# Patient Record
Sex: Female | Born: 1970
Health system: Southern US, Community
[De-identification: ages and names within clinical notes are randomized; demographics above are authoritative.]

## PROBLEM LIST (undated history)

## (undated) ENCOUNTER — Encounter

## (undated) ENCOUNTER — Ambulatory Visit

## (undated) ENCOUNTER — Telehealth

## (undated) ENCOUNTER — Ambulatory Visit: Attending: Pharmacist | Primary: Pharmacist

## (undated) ENCOUNTER — Ambulatory Visit: Payer: PRIVATE HEALTH INSURANCE

## (undated) ENCOUNTER — Encounter
Attending: Student in an Organized Health Care Education/Training Program | Primary: Student in an Organized Health Care Education/Training Program

## (undated) ENCOUNTER — Encounter: Attending: Pharmacist | Primary: Pharmacist

## (undated) ENCOUNTER — Telehealth: Attending: Family Medicine | Primary: Family Medicine

## (undated) DIAGNOSIS — R519 Headache, unspecified: Secondary | ICD-10-CM

## (undated) DIAGNOSIS — M1990 Unspecified osteoarthritis, unspecified site: Secondary | ICD-10-CM

## (undated) DIAGNOSIS — R51 Headache: Secondary | ICD-10-CM

## (undated) DIAGNOSIS — N189 Chronic kidney disease, unspecified: Secondary | ICD-10-CM

## (undated) DIAGNOSIS — E789 Disorder of lipoprotein metabolism, unspecified: Secondary | ICD-10-CM

## (undated) DIAGNOSIS — R55 Syncope and collapse: Secondary | ICD-10-CM

## (undated) DIAGNOSIS — K50113 Crohn's disease of large intestine with fistula: Secondary | ICD-10-CM

## (undated) DIAGNOSIS — F329 Major depressive disorder, single episode, unspecified: Secondary | ICD-10-CM

## (undated) DIAGNOSIS — J189 Pneumonia, unspecified organism: Secondary | ICD-10-CM

## (undated) DIAGNOSIS — K219 Gastro-esophageal reflux disease without esophagitis: Secondary | ICD-10-CM

## (undated) DIAGNOSIS — F988 Other specified behavioral and emotional disorders with onset usually occurring in childhood and adolescence: Secondary | ICD-10-CM

## (undated) DIAGNOSIS — M797 Fibromyalgia: Secondary | ICD-10-CM

## (undated) DIAGNOSIS — F32A Depression, unspecified: Secondary | ICD-10-CM

## (undated) DIAGNOSIS — F419 Anxiety disorder, unspecified: Secondary | ICD-10-CM

## (undated) DIAGNOSIS — E7800 Pure hypercholesterolemia, unspecified: Secondary | ICD-10-CM

## (undated) HISTORY — DX: Pneumonia, unspecified organism: J18.9

## (undated) HISTORY — DX: Gastro-esophageal reflux disease without esophagitis: K21.9

## (undated) HISTORY — DX: Headache: R51

## (undated) HISTORY — DX: Other specified behavioral and emotional disorders with onset usually occurring in childhood and adolescence: F98.8

## (undated) HISTORY — DX: Chronic kidney disease, unspecified: N18.9

## (undated) HISTORY — DX: Crohn's disease of large intestine with fistula: K50.113

## (undated) HISTORY — DX: Headache, unspecified: R51.9

## (undated) HISTORY — PX: TUBAL LIGATION: SHX77

## (undated) HISTORY — DX: Fibromyalgia: M79.7

## (undated) HISTORY — PX: FRACTURE SURGERY: SHX138

## (undated) HISTORY — DX: Unspecified osteoarthritis, unspecified site: M19.90

## (undated) HISTORY — DX: Anxiety disorder, unspecified: F41.9

## (undated) HISTORY — PX: OTHER SURGICAL HISTORY: SHX169

## (undated) HISTORY — DX: Syncope and collapse: R55

## (undated) MED ORDER — PREDNISONE 10 MG TABLETS IN A DOSE PACK: Freq: Every day | ORAL | 0.00000 days

---
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
## 1997-11-21 ENCOUNTER — Other Ambulatory Visit: Admission: RE | Admit: 1997-11-21 | Discharge: 1997-11-21 | Payer: Self-pay | Admitting: Obstetrics and Gynecology

## 1997-11-21 ENCOUNTER — Ambulatory Visit (HOSPITAL_COMMUNITY): Admission: RE | Admit: 1997-11-21 | Discharge: 1997-11-21 | Payer: Self-pay | Admitting: Obstetrics and Gynecology

## 1997-12-01 ENCOUNTER — Other Ambulatory Visit: Admission: RE | Admit: 1997-12-01 | Discharge: 1997-12-01 | Payer: Self-pay | Admitting: Obstetrics and Gynecology

## 1997-12-02 ENCOUNTER — Ambulatory Visit (HOSPITAL_COMMUNITY): Admission: RE | Admit: 1997-12-02 | Discharge: 1997-12-02 | Payer: Self-pay | Admitting: Gastroenterology

## 1998-03-30 ENCOUNTER — Observation Stay (HOSPITAL_COMMUNITY): Admission: AD | Admit: 1998-03-30 | Discharge: 1998-03-31 | Payer: Self-pay | Admitting: Obstetrics and Gynecology

## 1998-06-25 ENCOUNTER — Inpatient Hospital Stay (HOSPITAL_COMMUNITY): Admission: AD | Admit: 1998-06-25 | Discharge: 1998-06-25 | Payer: Self-pay

## 1998-06-26 ENCOUNTER — Inpatient Hospital Stay (HOSPITAL_COMMUNITY): Admission: AD | Admit: 1998-06-26 | Discharge: 1998-06-26 | Payer: Self-pay | Admitting: Obstetrics & Gynecology

## 1998-06-28 ENCOUNTER — Inpatient Hospital Stay (HOSPITAL_COMMUNITY): Admission: AD | Admit: 1998-06-28 | Discharge: 1998-06-30 | Payer: Self-pay | Admitting: Obstetrics and Gynecology

## 1999-01-26 ENCOUNTER — Encounter: Payer: Self-pay | Admitting: Gastroenterology

## 1999-01-26 ENCOUNTER — Ambulatory Visit (HOSPITAL_COMMUNITY): Admission: RE | Admit: 1999-01-26 | Discharge: 1999-01-26 | Payer: Self-pay | Admitting: Gastroenterology

## 1999-03-09 ENCOUNTER — Ambulatory Visit (HOSPITAL_COMMUNITY): Admission: RE | Admit: 1999-03-09 | Discharge: 1999-03-09 | Payer: Self-pay | Admitting: Gastroenterology

## 1999-03-09 ENCOUNTER — Encounter (INDEPENDENT_AMBULATORY_CARE_PROVIDER_SITE_OTHER): Payer: Self-pay

## 1999-03-15 ENCOUNTER — Ambulatory Visit (HOSPITAL_BASED_OUTPATIENT_CLINIC_OR_DEPARTMENT_OTHER): Admission: RE | Admit: 1999-03-15 | Discharge: 1999-03-15 | Payer: Self-pay | Admitting: Surgery

## 1999-03-18 ENCOUNTER — Ambulatory Visit (HOSPITAL_COMMUNITY): Admission: RE | Admit: 1999-03-18 | Discharge: 1999-03-18 | Payer: Self-pay | Admitting: Gastroenterology

## 1999-03-18 ENCOUNTER — Encounter: Payer: Self-pay | Admitting: Gastroenterology

## 1999-07-21 ENCOUNTER — Ambulatory Visit (HOSPITAL_BASED_OUTPATIENT_CLINIC_OR_DEPARTMENT_OTHER): Admission: RE | Admit: 1999-07-21 | Discharge: 1999-07-21 | Payer: Self-pay | Admitting: Surgery

## 1999-07-21 ENCOUNTER — Encounter (INDEPENDENT_AMBULATORY_CARE_PROVIDER_SITE_OTHER): Payer: Self-pay | Admitting: Specialist

## 2000-06-24 ENCOUNTER — Emergency Department (HOSPITAL_COMMUNITY): Admission: EM | Admit: 2000-06-24 | Discharge: 2000-06-24 | Payer: Self-pay | Admitting: Emergency Medicine

## 2000-06-24 ENCOUNTER — Encounter: Payer: Self-pay | Admitting: Emergency Medicine

## 2000-06-28 ENCOUNTER — Emergency Department (HOSPITAL_COMMUNITY): Admission: EM | Admit: 2000-06-28 | Discharge: 2000-06-29 | Payer: Self-pay | Admitting: Emergency Medicine

## 2000-08-17 ENCOUNTER — Emergency Department (HOSPITAL_COMMUNITY): Admission: EM | Admit: 2000-08-17 | Discharge: 2000-08-17 | Payer: Self-pay | Admitting: Emergency Medicine

## 2000-10-19 ENCOUNTER — Emergency Department (HOSPITAL_COMMUNITY): Admission: EM | Admit: 2000-10-19 | Discharge: 2000-10-19 | Payer: Self-pay | Admitting: Emergency Medicine

## 2000-11-05 ENCOUNTER — Emergency Department (HOSPITAL_COMMUNITY): Admission: EM | Admit: 2000-11-05 | Discharge: 2000-11-05 | Payer: Self-pay | Admitting: Emergency Medicine

## 2000-11-08 ENCOUNTER — Emergency Department (HOSPITAL_COMMUNITY): Admission: EM | Admit: 2000-11-08 | Discharge: 2000-11-08 | Payer: Self-pay | Admitting: Emergency Medicine

## 2001-02-16 ENCOUNTER — Ambulatory Visit (HOSPITAL_COMMUNITY): Admission: RE | Admit: 2001-02-16 | Discharge: 2001-02-16 | Payer: Self-pay | Admitting: *Deleted

## 2001-02-23 ENCOUNTER — Ambulatory Visit (HOSPITAL_COMMUNITY): Admission: RE | Admit: 2001-02-23 | Discharge: 2001-02-23 | Payer: Self-pay | Admitting: *Deleted

## 2001-02-23 ENCOUNTER — Encounter (INDEPENDENT_AMBULATORY_CARE_PROVIDER_SITE_OTHER): Payer: Self-pay | Admitting: *Deleted

## 2001-03-13 ENCOUNTER — Encounter: Payer: Self-pay | Admitting: *Deleted

## 2001-03-13 ENCOUNTER — Encounter: Admission: RE | Admit: 2001-03-13 | Discharge: 2001-03-13 | Payer: Self-pay | Admitting: *Deleted

## 2001-03-19 ENCOUNTER — Encounter: Admission: RE | Admit: 2001-03-19 | Discharge: 2001-03-19 | Payer: Self-pay | Admitting: Cardiology

## 2001-03-19 ENCOUNTER — Encounter: Payer: Self-pay | Admitting: Cardiology

## 2001-03-28 ENCOUNTER — Encounter: Payer: Self-pay | Admitting: *Deleted

## 2001-03-28 ENCOUNTER — Encounter: Admission: RE | Admit: 2001-03-28 | Discharge: 2001-03-28 | Payer: Self-pay | Admitting: *Deleted

## 2001-05-04 ENCOUNTER — Ambulatory Visit (HOSPITAL_COMMUNITY): Admission: RE | Admit: 2001-05-04 | Discharge: 2001-05-04 | Payer: Self-pay | Admitting: *Deleted

## 2001-06-06 ENCOUNTER — Ambulatory Visit (HOSPITAL_COMMUNITY): Admission: RE | Admit: 2001-06-06 | Discharge: 2001-06-06 | Payer: Self-pay | Admitting: *Deleted

## 2001-07-13 ENCOUNTER — Encounter (INDEPENDENT_AMBULATORY_CARE_PROVIDER_SITE_OTHER): Payer: Self-pay | Admitting: Specialist

## 2001-07-13 ENCOUNTER — Ambulatory Visit (HOSPITAL_COMMUNITY): Admission: RE | Admit: 2001-07-13 | Discharge: 2001-07-13 | Payer: Self-pay | Admitting: *Deleted

## 2001-08-13 ENCOUNTER — Inpatient Hospital Stay (HOSPITAL_COMMUNITY): Admission: EM | Admit: 2001-08-13 | Discharge: 2001-08-15 | Payer: Self-pay | Admitting: Emergency Medicine

## 2001-08-13 ENCOUNTER — Encounter: Payer: Self-pay | Admitting: Orthopedic Surgery

## 2001-08-13 ENCOUNTER — Encounter: Payer: Self-pay | Admitting: Emergency Medicine

## 2001-10-11 ENCOUNTER — Ambulatory Visit (HOSPITAL_COMMUNITY): Admission: RE | Admit: 2001-10-11 | Discharge: 2001-10-11 | Payer: Self-pay | Admitting: Orthopedic Surgery

## 2001-10-29 ENCOUNTER — Encounter: Admission: RE | Admit: 2001-10-29 | Discharge: 2002-01-27 | Payer: Self-pay | Admitting: Orthopedic Surgery

## 2001-11-29 ENCOUNTER — Ambulatory Visit (HOSPITAL_COMMUNITY): Admission: RE | Admit: 2001-11-29 | Discharge: 2001-11-29 | Payer: Self-pay | Admitting: *Deleted

## 2001-11-30 ENCOUNTER — Encounter (INDEPENDENT_AMBULATORY_CARE_PROVIDER_SITE_OTHER): Payer: Self-pay

## 2001-11-30 ENCOUNTER — Ambulatory Visit (HOSPITAL_COMMUNITY): Admission: RE | Admit: 2001-11-30 | Discharge: 2001-11-30 | Payer: Self-pay | Admitting: *Deleted

## 2002-02-22 ENCOUNTER — Encounter (HOSPITAL_COMMUNITY): Admission: RE | Admit: 2002-02-22 | Discharge: 2002-05-23 | Payer: Self-pay | Admitting: *Deleted

## 2002-03-25 ENCOUNTER — Ambulatory Visit (HOSPITAL_COMMUNITY): Admission: RE | Admit: 2002-03-25 | Discharge: 2002-03-25 | Payer: Self-pay | Admitting: *Deleted

## 2002-03-25 ENCOUNTER — Encounter (INDEPENDENT_AMBULATORY_CARE_PROVIDER_SITE_OTHER): Payer: Self-pay | Admitting: Specialist

## 2002-03-28 ENCOUNTER — Encounter: Payer: Self-pay | Admitting: *Deleted

## 2002-03-28 ENCOUNTER — Ambulatory Visit (HOSPITAL_COMMUNITY): Admission: RE | Admit: 2002-03-28 | Discharge: 2002-03-28 | Payer: Self-pay | Admitting: *Deleted

## 2002-05-31 ENCOUNTER — Ambulatory Visit (HOSPITAL_COMMUNITY): Admission: RE | Admit: 2002-05-31 | Discharge: 2002-05-31 | Payer: Self-pay | Admitting: *Deleted

## 2002-09-06 ENCOUNTER — Encounter (HOSPITAL_COMMUNITY): Admission: RE | Admit: 2002-09-06 | Discharge: 2002-09-06 | Payer: Self-pay | Admitting: *Deleted

## 2002-10-01 ENCOUNTER — Ambulatory Visit (HOSPITAL_COMMUNITY): Admission: RE | Admit: 2002-10-01 | Discharge: 2002-10-01 | Payer: Self-pay | Admitting: *Deleted

## 2002-10-18 ENCOUNTER — Encounter: Payer: Self-pay | Admitting: Emergency Medicine

## 2002-10-18 ENCOUNTER — Emergency Department (HOSPITAL_COMMUNITY): Admission: AC | Admit: 2002-10-18 | Discharge: 2002-10-18 | Payer: Self-pay

## 2002-10-29 ENCOUNTER — Emergency Department (HOSPITAL_COMMUNITY): Admission: EM | Admit: 2002-10-29 | Discharge: 2002-10-29 | Payer: Self-pay | Admitting: Emergency Medicine

## 2002-12-19 ENCOUNTER — Encounter (HOSPITAL_COMMUNITY): Admission: RE | Admit: 2002-12-19 | Discharge: 2003-03-19 | Payer: Self-pay | Admitting: *Deleted

## 2003-06-07 ENCOUNTER — Emergency Department (HOSPITAL_COMMUNITY): Admission: EM | Admit: 2003-06-07 | Discharge: 2003-06-07 | Payer: Self-pay | Admitting: Emergency Medicine

## 2003-08-18 ENCOUNTER — Encounter: Admission: RE | Admit: 2003-08-18 | Discharge: 2003-08-18 | Payer: Self-pay | Admitting: *Deleted

## 2004-03-09 ENCOUNTER — Ambulatory Visit (HOSPITAL_COMMUNITY): Admission: RE | Admit: 2004-03-09 | Discharge: 2004-03-09 | Payer: Self-pay | Admitting: Cardiology

## 2006-02-27 ENCOUNTER — Encounter (HOSPITAL_COMMUNITY): Admission: RE | Admit: 2006-02-27 | Discharge: 2006-05-28 | Payer: Self-pay | Admitting: Gastroenterology

## 2006-06-16 ENCOUNTER — Encounter (HOSPITAL_COMMUNITY): Admission: RE | Admit: 2006-06-16 | Discharge: 2006-09-14 | Payer: Self-pay | Admitting: Gastroenterology

## 2006-10-17 ENCOUNTER — Encounter (HOSPITAL_COMMUNITY): Admission: RE | Admit: 2006-10-17 | Discharge: 2007-01-15 | Payer: Self-pay | Admitting: Gastroenterology

## 2006-11-22 ENCOUNTER — Ambulatory Visit (HOSPITAL_COMMUNITY): Admission: RE | Admit: 2006-11-22 | Discharge: 2006-11-22 | Payer: Self-pay | Admitting: Obstetrics

## 2007-02-26 ENCOUNTER — Encounter (HOSPITAL_COMMUNITY): Admission: RE | Admit: 2007-02-26 | Discharge: 2007-05-27 | Payer: Self-pay | Admitting: Gastroenterology

## 2007-05-23 ENCOUNTER — Ambulatory Visit (HOSPITAL_COMMUNITY): Admission: RE | Admit: 2007-05-23 | Discharge: 2007-05-23 | Payer: Self-pay | Admitting: Gastroenterology

## 2007-06-29 ENCOUNTER — Emergency Department (HOSPITAL_COMMUNITY): Admission: EM | Admit: 2007-06-29 | Discharge: 2007-06-29 | Payer: Self-pay | Admitting: Emergency Medicine

## 2007-07-09 ENCOUNTER — Emergency Department (HOSPITAL_COMMUNITY): Admission: EM | Admit: 2007-07-09 | Discharge: 2007-07-09 | Payer: Self-pay | Admitting: Emergency Medicine

## 2007-10-17 ENCOUNTER — Encounter (HOSPITAL_COMMUNITY): Admission: RE | Admit: 2007-10-17 | Discharge: 2008-01-15 | Payer: Self-pay | Admitting: Gastroenterology

## 2008-04-13 ENCOUNTER — Emergency Department (HOSPITAL_COMMUNITY): Admission: EM | Admit: 2008-04-13 | Discharge: 2008-04-13 | Payer: Self-pay | Admitting: Emergency Medicine

## 2009-04-29 ENCOUNTER — Encounter: Admission: RE | Admit: 2009-04-29 | Discharge: 2009-04-29 | Payer: Self-pay | Admitting: Family Medicine

## 2009-05-08 ENCOUNTER — Encounter: Admission: RE | Admit: 2009-05-08 | Discharge: 2009-05-08 | Payer: Self-pay | Admitting: Gastroenterology

## 2009-06-09 ENCOUNTER — Encounter: Admission: RE | Admit: 2009-06-09 | Discharge: 2009-06-09 | Payer: Self-pay | Admitting: Family Medicine

## 2009-07-27 ENCOUNTER — Encounter: Admission: RE | Admit: 2009-07-27 | Discharge: 2009-09-17 | Payer: Self-pay | Admitting: Family Medicine

## 2009-10-28 ENCOUNTER — Encounter: Admission: RE | Admit: 2009-10-28 | Discharge: 2009-10-28 | Payer: Self-pay | Admitting: Family Medicine

## 2010-02-05 ENCOUNTER — Encounter: Admission: RE | Admit: 2010-02-05 | Discharge: 2010-02-05 | Payer: Self-pay | Admitting: Family Medicine

## 2010-03-11 ENCOUNTER — Encounter: Admission: RE | Admit: 2010-03-11 | Discharge: 2010-03-11 | Payer: Self-pay | Admitting: Family Medicine

## 2010-07-11 ENCOUNTER — Encounter: Payer: Self-pay | Admitting: Family Medicine

## 2010-08-02 ENCOUNTER — Inpatient Hospital Stay (HOSPITAL_COMMUNITY)
Admission: AD | Admit: 2010-08-02 | Discharge: 2010-08-02 | Disposition: A | Discharge status: Home / Self Care | Payer: Medicaid Other | Source: Ambulatory Visit | Attending: Obstetrics & Gynecology | Admitting: Obstetrics & Gynecology

## 2010-08-02 DIAGNOSIS — N938 Other specified abnormal uterine and vaginal bleeding: Secondary | ICD-10-CM

## 2010-08-02 DIAGNOSIS — N949 Unspecified condition associated with female genital organs and menstrual cycle: Secondary | ICD-10-CM

## 2010-08-02 LAB — WET PREP, GENITAL
Trich, Wet Prep: NONE SEEN
Yeast Wet Prep HPF POC: NONE SEEN

## 2010-08-02 LAB — URINALYSIS, ROUTINE W REFLEX MICROSCOPIC
Ketones, ur: NEGATIVE mg/dL
Protein, ur: NEGATIVE mg/dL
Urine Glucose, Fasting: NEGATIVE mg/dL
Urobilinogen, UA: 0.2 mg/dL (ref 0.0–1.0)
pH: 5.5 (ref 5.0–8.0)

## 2010-08-02 LAB — CBC
Hemoglobin: 12.6 g/dL (ref 12.0–15.0)
MCHC: 32.1 g/dL (ref 30.0–36.0)
MCV: 91.6 fL (ref 78.0–100.0)
Platelets: 251 10*3/uL (ref 150–400)
RBC: 4.28 MIL/uL (ref 3.87–5.11)
RDW: 14.5 % (ref 11.5–15.5)
WBC: 7.1 10*3/uL (ref 4.0–10.5)

## 2010-08-02 LAB — URINE MICROSCOPIC-ADD ON

## 2010-08-02 LAB — POCT PREGNANCY, URINE: Preg Test, Ur: NEGATIVE

## 2010-11-05 NOTE — Discharge Summary (Signed)
[place]. [facility]  Patient:    Katrina Grant, Katrina Grant Visit Number: [id number] MRN: [id number]          Service Type: SUR Location: 4W [place] 01 Attending Physician:  Mayme Genta Dictated by:   Sharmon Revere, M.D. Admit Date:  08/13/2001 Discharge Date: 08/15/2001                             Discharge Summary  ADMITTING DIAGNOSES:  1. Fracture of distal radius and ulna styloid of left wrist.  2. Sprain, left ankle.  3. History of Crohns disease.  DISCHARGE DIAGNOSES:  1. Fracture of distal radius and ulna styloid of left wrist.  2. Sprain, left ankle.  3. History of Crohns disease.  COMPLICATIONS: None.  INFECTIONS: None.  OPERATION/PROCEDURE:  1. Closed reduction and external fixation of left wrist.  2. Ankle brace to left ankle.  HISTORY OF PRESENT ILLNESS: This is a 40 year old female, who had stepped off a curb and missed her step and twisted her left ankle, and fell onto her left wrist.  The patient was seen at [facility] for treatment.  PHYSICAL EXAMINATION: Pertinent physical examination was of the left wrist with angulation deformity, tender, swollen.  Neurovascular status intact. Left ankle tender laterally and anteriorly, with tender anterior talofibular and calcaneal fibula ligament.  HOSPITAL COURSE: The patient had an ankle brace applied to the left ankle and underwent closed reduction and external fixation to her left wrist. Postoperatively the patient had difficulty holding food down with nausea and vomiting.  This was brought under control and the patients pain was brought under control with IV pain medication, followed by p.o. pain medication.  The patients swelling subsided with fair finger movement, nail beds blanched quite well.  DISCHARGE MEDICATIONS:  1. Percocet q.4h p.r.n. for pain.  2. Daypro 600 mg b.i.d.  3. Phenergan 25 mg q.6h p.r.n.  DISCHARGE INSTRUCTIONS:  1. Sling  elevation.  2. Ice packs.  FOLLOW-UP: Return to the office in one week.  DISCHARGE CONDITION: The patient was discharged in stable and satisfactory condition. Dictated by:   Sharmon Revere, M.D. Attending Physician:  Mayme Genta DD:  09/04/01 TD:  09/05/01 Job: [id number]

## 2010-11-05 NOTE — H&P (Signed)
[place]. [facility]  Patient:    Katrina Grant, Katrina Grant Visit Number: [id number] MRN: [id number]          Service Type: SUR Location: 4W [place] 01 Attending Physician:  Mayme Genta Dictated by:   Sharmon Revere, M.D. Admit Date:  08/13/2001 Discharge Date: 08/15/2001                           History and Physical  CHIEF COMPLAINT: Painful deformed left wrist, as well as tender, swollen left foot.  HISTORY OF PRESENT ILLNESS: This is a 40 year old black female, who was stepping off a curb and missed her footing, and twisted her left ankle and landed onto her left wrist.  The patient complained of pain and deformity of the left wrist as well as pain and swelling of the left ankle.  PAST MEDICAL HISTORY:  1. Crohns disease, steroid use x5 years.  The patient was recently taken off     two weeks ago.  2. Dental surgery.  3. Bilateral tubal ligation.  4. Pilonidal cyst excision.  FAMILY HISTORY: High blood pressure and diabetes in father.  SOCIAL HISTORY: Habits, none.  MEDICATIONS: None.  ALLERGIES: None known.  PHYSICAL EXAMINATION:  VITAL SIGNS: Temperature 96.7 degrees, pulse 74, respirations 18, blood pressure 132/94.  GENERAL: Alert and oriented, no acute distress.  HEENT: Normocephalic.  Conjunctivae and sclerae clear.  NECK: Supple.  CHEST: Clear.  CARDIOVASCULAR: S1 and S2, regular.  EXTREMITIES: Left ankle tender and swollen over the anterior talofibular and calcaneal fibular ligament.  Dorsalis pedis is intact.  Sensory is intact. Left wrist tender, swollen, with angulation deformity.  Nail beds pink and blanch.  Pulses intact.  Sensory intact.  LABORATORY DATA: X-rays reveal comminuted reverse Colles fracture, left wrist.  IMPRESSION:  1. Fracture of left distal radius and ulna styloid.  2. Sprain, left ankle.  3. History of Crohns disease.Dictated by:   Sharmon Revere, M.D.  Attending Physician:  Mayme Genta DD:  09/04/01 TD:  09/05/01 Job: [id number]

## 2010-11-05 NOTE — Op Note (Signed)
[place]. [facility]  Patient:    Katrina Grant, Katrina Grant Visit Number: [id number] MRN: [id number]          Service Type: SUR Location: 4W [place] 01 Attending Physician:  Mayme Genta Dictated by:   Sharmon Revere, M.D. Proc. Date: 08/13/01 Admit Date:  08/13/2001 Discharge Date: 08/15/2001                             Operative Report  PREOPERATIVE DIAGNOSIS: Fracture of distal radius and ulna styloid.  POSTOPERATIVE DIAGNOSIS: Fracture of distal radius and ulna styloid.  OPERATION/PROCEDURE: Closed manipulative reduction and external fixation.  SURGEON: Sharmon Revere, M.D.  ANESTHESIA: General.  DESCRIPTION OF PROCEDURE: The patient was taken to the operating room after given adequate preoperative medication and was given general anesthesia and intubated.  The left wrist and forearm was prepped with DuraPrep and draped in sterile manner.  The C-arm was used to visualize fracture reduction. Manipulative reduction was done, getting a very good reduction of the fracture site.  Two pins were placed into the second metacarpal and two in the radius proximal to the fracture site.  External fixator was applied.  Manipulated reduction and traction was applied across the fracture site, holding it in very good position.  Irrigation was done, followed by 0 nylon sutures about the pin sites.  Compressive dressing was applied.  The patient tolerated the procedure quite well and went to the recovery room in stable and satisfactory condition. Dictated by:   Sharmon Revere, M.D. Attending Physician:  Mayme Genta DD:  09/04/01 TD:  09/05/01 Job: [id number]

## 2010-11-05 NOTE — Op Note (Signed)
[place]. [facility]  Patient:    Katrina Grant, Katrina Grant Visit Number: [id number] MRN: [id number]          Service Type: DSU Location: [facility] [id number] Attending Physician:  Mayme Genta Dictated by:   Sharmon Revere, M.D. Proc. Date: 10/11/01 Admit Date:  10/11/2001 Discharge Date: 10/11/2001                             Operative Report  PREOPERATIVE DIAGNOSIS:  Colles fracture, external fixator, left forearm.  POSTOPERATIVE DIAGNOSIS:  Colles fracture, external fixator, left forearm.  SURGEON:  Sharmon Revere, M.D.  ANESTHESIA:  General.  PROBLEM:  Removal of external fixator and wound closure of left forearm.  DESCRIPTION OF PROCEDURE:  The patient was taken to the operating room, after being given adequate preoperative medication.  She was given a general anesthesia and intubated.  The left forearm was scrubbed with Betadine scrub and painted with Betadine solution, and then draped in a sterile manner.  The external fixator was removed, followed by the removal of the four pins. Irrigation with antibiotic solution was done.  With the use of a Valora Corporal, the entrance wounds were undermined.  The skin was freed up from the bone.  The skin edges were freshened and the wound closure was done with #4-0 nylon.  A compressive dressing was applied.  A wrist splint was applied.  The patient tolerated the procedure quite well and went to the recovery room in stable and satisfactory condition.  PLAN:  The patient is being discharged home on Percocet one q.4h. p.r.n. pain, ice packs, elevation, and to return to the office in one week.  CONDITION ON DISCHARGE:  The patient is discharged in stable and satisfactory condition. Dictated by:   Sharmon Revere, M.D. Attending Physician:  Mayme Genta DD:  11/06/01 TD:  11/06/01 Job: [id number]

## 2010-11-05 NOTE — Op Note (Signed)
[place]. [facility]  Patient:    JISELL MAJER                          MRN: [id number] Proc. Date: 07/21/99 Adm. Date:  [id number] Attending:  Cristela Blue CC:         Cleotis Nipper, M.D.                           Operative Report  DATE OF BIRTH:  1971/04/26  CCS#: [id number].  PREOPERATIVE DIAGNOSIS:  Pilonidal sinus infection of buttocks with Crohns disease of the colon.  POSTOPERATIVE DIAGNOSIS:  Pilonidal disease/infection of buttocks with Crohns disease of the colon with rectal stricture.  PROCEDURE:  Rigid sigmoidoscopy to 20 cm and pilonidal cystectomy.  SURGEON:  Fenton Malling. Lucia Gaskins, M.D.  ASSISTANT:  None.  ANESTHESIA:  General endotracheal.  ESTIMATED BLOOD LOSS:  35 cc.  DRAINS:  Packing in the buttocks wound.  INDICATIONS:  Ms. Lilia Pro is a 40 year old female, who is a patient of Dr. Youlanda Mighty Buccini, who has Crohns disease.  She is on steroids for her Crohns disease. She has been plagued over the last years with a pilonidal infection.  It has been unclear to me whether this pilonidal infection was related to her Crohns disease or not.  However, she has had both a colonoscopy and a CT-scan of her buttocks,  neither of which have shown any fistulization from her Crohns disease.  She now comes for rigid sigmoidoscopy and pilonidal cystectomy.  OPERATIVE NOTE:  Patient is given a general endotracheal anesthetic as supervised by Dr. Earnest Bailey.  She was placed in the lithotomy position.  I then did a rigid sigmoidoscopy to 20 cm.  I found an inflamed area at about 10 cm from the  anal verge, some nodularity down to about 7 cm and she had a stricture, which only allowed my finger, which measured of the stricture is about 1.2 cm right at the  anus.  I dilated the stricture.  I saw no evidence of an fistulization from the colon hat leads to the buttocks that I could see internally.  The patient was then  flipped to a prone position with pads under her hips, legs, and her arms placed up over her head.  I then taped the buttocks a part.  I then painted her buttocks with Betadine solution and injected methylene blue into the sinus tract.  I then excised the sinus tract, which is probably a good 10 cm in length.  At the top of the sinus tract was probably 5 cm in width.  It seemed to be pointing to  both sides and I excised the skin over this.  I then controlled the bleeding with Bovie electrocautery.  I closed the right side of her buttocks a little bit, because the skin was so far a part, I am not sure  whether these sutures will hold overnight.  I then packed the wound with Betadine soaked Kerlix and then sterilely dressed the wound.  The entire wound was infiltrated with about 20 cc of 0.25% Marcaine plain.  The patient tolerated the procedure well.  Will arrange home health care nursing to go by and help her daily on the dressing changes, give her Vicodin for pain. She will see me back in the office in about five days. DD:  07/21/99 TD:  07/21/99 Job: [id number]  [id number]

## 2010-11-05 NOTE — Op Note (Signed)
NAME:  Katrina Grant, Katrina Grant                          ACCOUNT NO.:  [id number]   MEDICAL RECORD NO.:  [id number]                   PATIENT TYPE:  AMB   LOCATION:  ENDO                                 FACILITY:  [facility]   PHYSICIAN:  Woody Seller., M.D.               DATE OF BIRTH:  02-13-1971   DATE OF PROCEDURE:  DATE OF DISCHARGE:  03/25/2002                                 OPERATIVE REPORT   PREOPERATIVE DIAGNOSIS:  History of Crohn's disease.   POSTOPERATIVE DIAGNOSES:  Inflammation in the cecal region and rectosigmoid  area about where biopsies were taken.  Otherwise, grossly normal  colonoscopic examination with random biopsies taken for inflammatory bowel  disease.   PROCEDURE PERFORMED:  Colonoscopy with biopsies.   PREMEDICATION:  Demerol 100 mg IV, Versed 10 mg IV over 15 minute period of  time.   INSTRUMENT:  Olympus video pancolonoscope.   HISTORY OF PRESENT ILLNESS:  This pleasant 40 year old female who has been  known to me for the past year with a history of Crohn's disease.  She has  underwent endoscopies which showed evidence of moderate to acute  inflammation initially and then was treated with the Remicade system.  Post  endoscopy was grossly normal all the way to the cecum.  She had been treated  with this medication after the Remicade and then subsequently was placed on  methotrexate which she could not tolerate.  She was then switched to another  medication, I believe Dipentum which the patient tolerated poorly with  recurrence of symptoms that was present.  She had a repeat colonoscopic  examination at that time which showed evidence of inflammation that was  present.  She was started on a different medication since then and has been,  and actually she was started back with another Remicade injection shot to  that point and underwent a series of two to three with good improvement of  her system at that time.   The patient returns back to the office with  complaints of having some  abdominal discomfort that is noted.  She has not noticed any blood per  rectum at this time but she has noticed some change in her stool habits with  diarrhea that is increasing in character.  She has not noticed any major  weight loss but does have a concern that the inflammation is recurring at  this time.  Her last Remicade injection was approximately two months ago and  she has been doing well otherwise until recent events.   PHYSICAL EXAMINATION:  GENERAL: She is a very pleasant female, appears to be  in no acute distress today.  VITAL SIGNS: Her vital signs are stable.  HEENT: Examination is anicteric.  NECK: Supple.  LUNGS: Clear.  HEART: A  regular rate and rhythm without heaves, thrills, murmurs or gallops.  ABDOMEN: Soft.  No gross tenderness to palpation is noted.  No  hepatosplenomegaly appreciated.  RECTAL: Digital rectal exam appears to be  within normal limits.  EXTREMITIES: Showed no cyanosis, clubbing or edema.   PLAN:  I am going to proceed with the colonoscopic examination at this time  to evaluate her response to the Remicade therapy at this time.  Initially,  she had an excellent response with healing of all the tissue but  subsequently, the responses have not been as effective.  This is the third  colonoscopic examination that she is having as she, after therapy with some  recurrence of symptoms ongoing.   INFORMED CONSENT:  I discussed with the patient her condition and what is  going on.  I discussed my concerns about it and the recommendations to  repeat the colonoscopic exam.  She has agreed to have the procedure  performed at this time.  A consent form was signed for this procedure to be  performed.   PREOPERATIVE PREPARATION:  The patient presented to [facility] with  the IV for IV sedating medication was started.  A monitor was placed on the  patient to monitor the patient's vital signs and oxygen saturation.  Oxygen   via a ventilator system was used and after adequate sedation was performed,  the procedure was begun.   DESCRIPTION OF PROCEDURE:  The instrument was advanced with the patient  lying in the left lateral position to approximately 80 cm to the proximal  colon to the cecum.  This was confirmed by visualization of the ileocecal  valve that was noted.   There appeared to be evidence of pseudopolyps/polyps that were appreciated  that were present at this time.  There was no evidence of any mass, lesions  or any strictures that were noted.  The polyps were in the area of  inflammation and were biopsied at that time.  Otherwise, the other areas of  the colon appeared to show no evidence of any inflammatory changes or  ulcerated changes or mucosal changes that were noted.   The vascular pattern appeared to be grossly within normal limits except  those increased amounts of stool obscuring some of the visualization in some  areas that was present.  There was an area in the ascending colon which had  inflammation that was present and some friability of the mucosal tissue was  noted.  But no major active bleeding was appreciated at this time.  Photographs were taken of the area that were evaluated at this time.   The mucosal content showed no evidence of diverticular changes or any, or  there was however, some evidence of granular changes intermixed with normal  mucosal tissue throughout the area.  And again, as noted the  polyps/pseudopolyps that were present at this time.   There was no increased tortuosity of the colon at this time and the patient  tolerated the procedure well without any major difficulties noted.  The  instrument was retracted back with retroflexed view of the anus area showed  no evidence of any hemorrhoids nor any inflammatory changes that were noted.  Photographs were then taken at this time.  The instrument was subsequently removed per rectum without difficulty with the  patient tolerating the  procedure well.   TREATMENT:  1. Limited exam.  Consider restarting her back on the Remicade at this time     but first I will await the results of the biopsy reports.  2. Will have her follow up with me in two weeks where we can discuss  the     approach with what needs to be done.  3. For the interim, just continue conservative management.                                               Woody Seller., M.D.    JM/MEDQ  D:  05/11/2002  T:  05/11/2002  Job:  [id number]

## 2010-11-05 NOTE — H&P (Signed)
[place]. [facility]  Patient:    Katrina Grant, Katrina Grant Visit Number: [id number] MRN: [id number]          Service Type: DSU Location: [facility] [id number] Attending Physician:  Mayme Genta Dictated by:   Sharmon Revere, M.D. Admit Date:  10/11/2001 Discharge Date: 10/11/2001                           History and Physical  CHIEF COMPLAINT:  External fixator, left forearm.  HISTORY:  This is a 40 year old female who had fallen and sustained a severely comminuted Colles fracture to the left wrist, and underwent an external fixation and manipulation and reduction.  The patient presently is returning for the removal of the external fixator.  PAST MEDICAL HISTORY 1. Crohns disease. 2. Arthritis. 3. Osteoporosis. 4. Tubal ligation. 5. Pilonidal cyst.  ALLERGIES:  No known drug allergies.  CURRENT MEDICATIONS 1. Levaquin. 2. Fosamax. 3. Pentasa, eight tab q.d.  SOCIAL HISTORY:  Habits:  None.  FAMILY HISTORY:  Noncontributory.  REVIEW OF SYSTEMS:  No cardiac or respiratory symptoms.  Some symptoms from her Crohns disease.  PHYSICAL EXAMINATION  VITAL SIGNS:  Temperature 97.7 degrees, pulse 106, respirations 18, blood pressure 116/77.  Height 5 feet 6 inches, weight 186 pounds.  HEENT:  Normocephalic.  Eyes:  Conjunctivae and sclerae clear.  NECK:  Supple.  CHEST:  Clear.  CARDIAC:  S1, S2 regular.  EXTREMITIES:  Left forearm with external fixator with four pins, two proximal and two distal to the fracture site, besides a skin infection.  Good finger movement.  IMPRESSION:  Comminuted Colles fracture with external fixator, left forearm. Dictated by:   Sharmon Revere, M.D. Attending Physician:  Mayme Genta DD:  11/06/01 TD:  11/06/01 Job: [id number]

## 2010-11-05 NOTE — Op Note (Signed)
[place]  Patient:    Katrina Grant, Katrina Grant Visit Number: [id number] MRN: [id number]          Service Type: OUT Location: [place] Attending Physician:  Woody Seller Dictated by:   Woody Seller., M.D. Proc. Date: 11/30/01 Admit Date:  11/29/2001 Discharge Date: 11/29/2001                             Operative Report  INDICATIONS FOR STUDY:  Female with a history of Crohns disease, was brought back in for recurrence of symptoms.  She has hence been diagnosed with Crohns disease several years ago, and has been treated by one of my associates in [place].  Because of her noncompliance with treatment and care, she was subsequently discharged from this practice at this time.  She came to my office in the year 2002 for the evaluation of these symptoms and discomforts that she has.  She explained to me the situation and the things that have happened to her, and also why she was taken off from the other physicians practice.  She underwent an initial flexible sigmoidoscope, which was consistent with a lot of inflammation that was present.  She was started with the Remicade medication (previously not treated with this), and was found to have a very good response to this.  She received two injections of the Remicade and subsequently underwent a full colonoscopic examination; with healing of most of the inflammatory changes that were present.  The mucosal pattern appeared to be normal, and she did relatively well.  She was subsequently treated with various medications, but later was put on Asacol while an attempt to help the area heal; I switched to Lehr to get better response.  She started noticing a recurrence of discomforts.  Indeed, some issue of this recurrence was appeared to be the early formation of the fistula tract in above her anal canal that was noted, as well as recurrent left lower quadrant pains and discomforts and some bleeding  noted.  She came into the office approximately about one week ago, with explanation of these symptoms that are recurring.  She was brought back for a repeat colonoscopic examination to see the extent of this process that is present.  OBJECTIVE FINDINGS:  GENERAL:  She is a pleasant female and appears to be in no distress.  VITAL SIGNS:  Stable.  HEENT:  Appears to be anicteric.  NECK:  Supple.  LUNGS:  Clear to auscultation and percussion.  HEART:  Regular rate and rhythm, without murmur, rub or gallop.  ABDOMEN:  Soft.  There was mild tenderness to palpation, but in nonspecific areas.  There was no rebound or referred tenderness as appreciated at this time.  EXTREMITIES:  Show no clubbing, cyanosis or edema.  RECTAL:  The anus area around the anal region showed evidence of what appeared to be a fistula tract, approximately six inches at the beginning of the division of buttock area.  It appeared to be an opening with some drainage and whitish material, but no other stool products or drainage as noted.  She also states that she feels another one is occurring, but I could not appreciate that region at this point.  PLAN:  I am presently going to proceed with this procedure to evaluate the area the extent of recurrence of Crohns disease at this point.  I have started her with Remicade medication to help the area.  INFORMED CONSENT:  The patient was advised of the procedure again, and the reason why I wanted to do the procedure.  She has agreed to have it done at this time.  PREOPERATIVE PREPARATION:  The patient was brought to the [facility] and inducted with the IV; IV sedating medication was started.  A monitor was placed on the patient to monitor the patients vital signs and oxygen saturation.  Nasal oxygen at 2 L was used, and after adequate sedation was performed the procedure was begun.  DESCRIPTION OF PROCEDURE:  The patient was given GoLYTELY as a  mild prep, that was initially given to her.  She noticed some tinging of blood in this area, but no gross bleeding that was present at this time.  She is clean. She states that it appears to have cleared, and she has done well with the prep overnight.  The instrument was advanced with the patient lying the left lateral position, to approximately 95-100 cm of proximal colon to the cecal area.  There appeared to be evidence of small stricture in the anorectal area (previously noted in the past), without any major inflammatory process of the bowels present.  The mucosal pattern also had irregularities, with skip lesions and inflammatory changes that were present throughout the area.  These several areas that were present were mixed in between normal tissue that was present.  There appeared to evidence of polypoid processes that were noted, but these were not removed at this time.  I have to admit I do not remember seeing them on the previous colonoscopic examination, which appears normal. Therefore I am concluding at the moment that these areas are probably normal tissue, that is with surrounding base inflammatory changes; raising these areas to be consistent with polypoid process that was present at this time.  The vascular pattern appeared to be grossly within normal limits, except on the areas where the inflammatory changes were noted from the Crohns disease that was present.  The mucosal pattern showed evidence of ulcerations that were present with tissue edema and inflammation that was appreciated at this time.  Random biopsies were taken throughout the area from the cecal region and the various sections of inflammatory changes that were present; to evaluate the extent of the disease process that was present.  There appeared to be also evidence of a moderate wall lesion, which looks normal in character but associated inflammation around the area and behind the area were consistently  noted.  My question is this looks like it would characteristically look like a lipoma, but the color is the normal color of  the mucosal tissue present.  So, I feel that this is possibly just related to area of compression causing this area to be round, smooth and enlarged.  There was no evidence of any internal nor external hemorrhoids upon exiting from the area, but a stricture was appreciated and also noted on digital examination as the instrument was retracted back.  There was also no increased tortuosity of the colon at this time.  TREATMENT:  The patient has received the first dose of Remicade EF today, and she will receive another dose in six weeks.  Prior to anything else being given, she will have another colonoscopic examination to see how this is responding and how she clinically is responding to this process.  I believe for the maintenance though, methyltrexate would probably be our best option from that standpoint, as compared to using any other agent.  Depending  upon her response, will determine the course of therapy.  FOLLOW-UP:  Dr. Everlean Cherry in two weeks.  I will explain the above to her at that time. Dictated by:   Woody Seller., M.D. Attending Physician:  Woody Seller DD:  11/30/01 TD:  12/02/01 Job: [id number]

## 2010-11-05 NOTE — Consult Note (Signed)
[facility]  Patient:    Katrina Grant, Katrina Grant                         MRN: [id number] Proc. Date: 08/17/00 Adm. Date:  [id number] Disc. Date: [id number] Attending:  Katy Apo                          Consultation Report  CHIEF COMPLAINT: 1. Nonhealed area of pilonidal with increased drainage. 2. Tender area by rectum.  HISTORY OF PRESENT ILLNESS:  This patient is a 40 year old who Dr. Alphonsa Overall, about one year ago, apparently excised a pilonidal which has never completely healed and has recently started having increased drainage, for which she was coming into the emergency room.  In addition, yesterday she developed tenderness and a swollen area just to the left of the midline in her perianal area that has become exquisitely tender.  She has not had any fever or chills.  She does have a known history of Crohns disease.  She is not allergic to anything.  She is not currently on any medicines, although she just did start back her prednisone for her Crohns disease. She is changing gastroenterologist, apparently from Dr. Cristina Gong to Dr. Delfin Edis.  PHYSICAL EXAMINATION:  GENERAL:  The patient is healthy and alert in no distress.  VITAL SIGNS:  Noted on the chart.  BACK:  A long pilonidal sinus opening with marked hypertrophic granulations clearly not healed but not acutely inflamed or infected and I think this is all chronic inflammatory changes.  GENITALIA/RECTUM:  Exam of the perineal area shows a small, soft, fluctuant area just to the left anterior midline.  After discussion with the patient, we anesthetized this with about 20 cc of 1% Xylocaine with epinephrine and opened right over the fluctuant area.  We got into some chronic, inflamed tissue and then a little bit deeper into a small cavity where the purulent drainage apparently had originated.  This was packed with some Iodoform.  IMPRESSION: 1. Nonhealed pilonidal. 2. Acute perianal  abscess possibly related to her Crohns.  PLAN:  She will begin sitz baths tomorrow.  I have given her Vicodin for pain, Keflex 1 b.i.d.  She will follow up with Dr. Alphonsa Overall early next week in the office.  She is to come back to the emergency room or call if she has further problems over the weekend. DD:  08/17/00 TD:  08/18/00 Job: [id number]

## 2011-03-10 LAB — URINE CULTURE: Colony Count: 60000

## 2011-03-10 LAB — URINE MICROSCOPIC-ADD ON

## 2011-03-10 LAB — URINALYSIS, ROUTINE W REFLEX MICROSCOPIC
Urobilinogen, UA: 1
pH: 7

## 2011-03-12 ENCOUNTER — Inpatient Hospital Stay (HOSPITAL_COMMUNITY): Payer: Medicare Other

## 2011-03-12 ENCOUNTER — Encounter (HOSPITAL_COMMUNITY): Payer: Self-pay | Admitting: *Deleted

## 2011-03-12 ENCOUNTER — Inpatient Hospital Stay (HOSPITAL_COMMUNITY)
Admission: AD | Admit: 2011-03-12 | Discharge: 2011-03-12 | Disposition: A | Discharge status: Home / Self Care | Payer: Medicare Other | Source: Ambulatory Visit | Attending: Obstetrics & Gynecology | Admitting: Obstetrics & Gynecology

## 2011-03-12 DIAGNOSIS — N83209 Unspecified ovarian cyst, unspecified side: Secondary | ICD-10-CM | POA: Insufficient documentation

## 2011-03-12 DIAGNOSIS — D259 Leiomyoma of uterus, unspecified: Secondary | ICD-10-CM | POA: Insufficient documentation

## 2011-03-12 DIAGNOSIS — N949 Unspecified condition associated with female genital organs and menstrual cycle: Secondary | ICD-10-CM | POA: Insufficient documentation

## 2011-03-12 DIAGNOSIS — N938 Other specified abnormal uterine and vaginal bleeding: Secondary | ICD-10-CM | POA: Insufficient documentation

## 2011-03-12 HISTORY — DX: Depression, unspecified: F32.A

## 2011-03-12 HISTORY — DX: Pure hypercholesterolemia, unspecified: E78.00

## 2011-03-12 HISTORY — DX: Disorder of lipoprotein metabolism, unspecified: E78.9

## 2011-03-12 HISTORY — DX: Major depressive disorder, single episode, unspecified: F32.9

## 2011-03-12 LAB — URINALYSIS, ROUTINE W REFLEX MICROSCOPIC
Glucose, UA: NEGATIVE mg/dL
pH: 5.5 (ref 5.0–8.0)

## 2011-03-12 LAB — URINE MICROSCOPIC-ADD ON

## 2011-03-12 LAB — CBC
MCH: 30.4 pg (ref 26.0–34.0)
MCHC: 32.8 g/dL (ref 30.0–36.0)
Platelets: 247 10*3/uL (ref 150–400)
RDW: 14.8 % (ref 11.5–15.5)

## 2011-03-12 LAB — WET PREP, GENITAL: Yeast Wet Prep HPF POC: NONE SEEN

## 2011-03-12 LAB — POCT PREGNANCY, URINE: Preg Test, Ur: NEGATIVE

## 2011-03-12 MED ORDER — ACETAMINOPHEN-CODEINE #3 300-30 MG PO TABS: 1.0000 | ORAL_TABLET | ORAL | Status: AC | PRN

## 2011-03-12 NOTE — ED Provider Notes (Signed)
History     Chief Complaint  Patient presents with  . Vaginal Bleeding   HPIMaria L Morgan40 y.o.presents with concerns about vaginal bleeding. Saw her PCP, Dr. Ernie Hew yesterday and was rx an antibiotic.  She was unable to get antibiotic at pharmacy and they wanted to discuss with her doctor.  Hx of Chron's.  Was told  The antibiotic is for UTI but thought her urine was clear.  LMP 9./7 and began bleeding again today.  Last cycle was 2 days longer.  Also having pelvic pain.  Urine flow is scant, frequency.  CT scan is being sent up to r/o kidney stones.  Hx of" fistula tracts from rectum to her buttock" with 4 surgeries.  Denies bleeding associated with Chrons.  Had chills, denies fever.  G2 P2 Sexually active 1 partner X 8 years, BTL for contraception.   Past Medical History  Diagnosis Date  . Arthritis   . Depression   . Shortness of breath   . Headache   . Crohn's disease of large intestine with fistula     since age 46  . Attention deficit disorder   . Attention deficit disorder     dx at age 71  . Cholesterol serum elevated   . Attention deficit disorder     Past Surgical History  Procedure Date  . Anal fistula repair      X 4   . Fracture surgery   . Tubal ligation   . Colonoscopy with dilation     X 2     No family history on file.  History  Substance Use Topics  . Smoking status: Never Smoker   . Smokeless tobacco: Never Used  . Alcohol Use: 0.6 oz/week    1 Glasses of wine per week    Allergies:  Allergies  Allergen Reactions  . Percocet (Oxycodone-Acetaminophen) Nausea And Vomiting    Prescriptions prior to admission  Medication Sig Dispense Refill  . acetaminophen (TYLENOL) 500 MG tablet Take 1,000 mg by mouth every 6 (six) hours as needed. For migraines and stomach upset       . adalimumab (HUMIRA PEN STARTER) 40 MG/0.8ML injection Inject 40 mg into the skin every 14 (fourteen) days.        Marland Kitchen amphetamine-dextroamphetamine (ADDERALL XR) 20 MG 24 hr  capsule Take 20 mg by mouth every morning.        . Clarithromycin (BIAXIN PO) Take 1 tablet by mouth 2 (two) times daily.        . FENOFIBRATE PO Take 1 tablet by mouth daily.        . folic acid (FOLVITE) 1 MG tablet Take 1 mg by mouth daily.        Marland Kitchen loratadine (CLARITIN) 10 MG tablet Take 10 mg by mouth as needed. Use to decrease irritation at injection site and for occasional allergy       . methotrexate (RHEUMATREX) 2.5 MG tablet Take 2.5 mg by mouth once a week. Caution:Chemotherapy. Protect from light. Dose regimen: 3 tab, 3 tabs 12 hours later, then 4 tablets 12hrs later       . Vitamin D, Ergocalciferol, (DRISDOL) 50000 UNITS CAPS Take 50,000 Units by mouth every 7 (seven) days.        . ciprofloxacin (CIPRO) 500 MG tablet Take 500 mg by mouth 2 (two) times daily. For UT (3 day supply)       . nitrofurantoin, macrocrystal-monohydrate, (MACROBID) 100 MG capsule Take 100 mg by mouth 2 (two)  times daily. For UTI, medication changed (to cipro) due to non susceptible strain         Review of Systems  Constitutional: Positive for chills and malaise/fatigue. Negative for fever.  Gastrointestinal: Positive for abdominal pain. Negative for nausea and vomiting.  Genitourinary:       Vaginal bleeding   Physical Exam   Blood pressure 120/82, pulse 103, temperature 98.4 F (36.9 C), temperature source Oral, resp. rate 18, height 5' 4.5" (1.638 m), weight 188 lb 9.6 oz (85.548 kg), last menstrual period 02/25/2011.  Physical Exam  Constitutional: She is oriented to person, place, and time. She appears well-developed and well-nourished. No distress.  HENT:  Head: Normocephalic.  Neck: Normal range of motion.  Respiratory: Effort normal.  GI: Soft. She exhibits no distension and no mass. There is no tenderness. There is no rebound and no guarding.  Genitourinary: Uterus is tender. Uterus is not deviated, not enlarged and not fixed. Cervix exhibits no motion tenderness, no discharge and no  friability. Right adnexum displays no mass, no tenderness and no fullness. Left adnexum displays no mass, no tenderness and no fullness. There is bleeding (moderate amount of bright red blood without clot) around the vagina. No tenderness around the vagina.  Neurological: She is alert and oriented to person, place, and time.  Skin: Skin is warm and dry.    MAU Course  Procedures  Pelvic exam; GC/CHl cultures to lab  MDM    Care turned over to N. Mare Ferrari, CNM at 5:30  Results for orders placed during the hospital encounter of 03/12/11 (from the past 24 hour(s))  URINALYSIS, ROUTINE W REFLEX MICROSCOPIC     Status: Abnormal   Collection Time   03/12/11  4:10 PM      Component Value Range   Color, Urine YELLOW  YELLOW    Appearance HAZY (*) CLEAR    Specific Gravity, Urine >1.030 (*) 1.005 - 1.030    pH 5.5  5.0 - 8.0    Glucose, UA NEGATIVE  NEGATIVE (mg/dL)   Hgb urine dipstick LARGE (*) NEGATIVE    Bilirubin Urine NEGATIVE  NEGATIVE    Ketones, ur NEGATIVE  NEGATIVE (mg/dL)   Protein, ur NEGATIVE  NEGATIVE (mg/dL)   Urobilinogen, UA 0.2  0.0 - 1.0 (mg/dL)   Nitrite NEGATIVE  NEGATIVE    Leukocytes, UA TRACE (*) NEGATIVE   URINE MICROSCOPIC-ADD ON     Status: Normal   Collection Time   03/12/11  4:10 PM      Component Value Range   Squamous Epithelial / LPF RARE  RARE    WBC, UA 3-6  <3 (WBC/hpf)   RBC / HPF 21-50  <3 (RBC/hpf)   Bacteria, UA RARE  RARE   POCT PREGNANCY, URINE     Status: Normal   Collection Time   03/12/11  4:20 PM      Component Value Range   Preg Test, Ur NEGATIVE    WET PREP, GENITAL     Status: Abnormal   Collection Time   03/12/11  5:21 PM      Component Value Range   Yeast, Wet Prep NONE SEEN  NONE SEEN    Trich, Wet Prep NONE SEEN  NONE SEEN    Clue Cells, Wet Prep NONE SEEN  NONE SEEN    WBC, Wet Prep HPF POC RARE (*) NONE SEEN   CBC     Status: Normal   Collection Time   03/12/11  5:31 PM  Component Value Range   WBC 5.5  4.0 - 10.5  (K/uL)   RBC 4.05  3.87 - 5.11 (MIL/uL)   Hemoglobin 12.3  12.0 - 15.0 (g/dL)   HCT 37.5  36.0 - 46.0 (%)   MCV 92.6  78.0 - 100.0 (fL)   MCH 30.4  26.0 - 34.0 (pg)   MCHC 32.8  30.0 - 36.0 (g/dL)   RDW 14.8  11.5 - 15.5 (%)   Platelets 247  150 - 400 (K/uL)    US Transvaginal Non-ob  03/12/2011  *RADIOLOGY REPORT*  Clinical Data: 40 year old G2 P2, LMP 02/25/2011, presenting with dysfunctional uterine bleeding, pelvic pain, and dyspareunia. Surgical history includes bilateral tubal ligation.  TRANSABDOMINAL AND TRANSVAGINAL ULTRASOUND OF PELVIS 03/12/2011:  Technique:  Both transabdominal and transvaginal ultrasound examinations of the pelvis were performed. Transabdominal technique was performed for global imaging of the pelvis including uterus, ovaries, adnexal regions, and pelvic cul-de-sac.  Comparison: CT abdomen and pelvis 10/28/2009 [place].  It was necessary to proceed with endovaginal exam following the transabdominal exam to visualize the endometrium and ovaries due to the incomplete urinary bladder distention.  Findings:  Uterus: Normal in size measuring approximately 8.8 x 4.4 x 5.8 cm. Fibroid involving the posterior uterine fundus approximating 1 cm in size.  Small Nabothian cyst on the cervix.  Endometrium: Normal in appearance measuring approximately 3 mm in thickness.  No endometrial fluid or mass.  Right ovary:  Normal in size measuring approximately 2.3 x 0.8 x 1.8 cm.  No dominant cyst or solid mass.  Left ovary: Normal in size measuring approximately 3.2 x 1.9 x 2.6 cm.  1.7 cm simple cyst and small follicular cysts within the ovary.  No solid masses.  Other findings: No free pelvic fluid.  IMPRESSION:  1.  Approximate 1 cm posterior uterine fundal fibroid. 2.  No significant abnormality otherwise.  Approximate 1.7 cm simple cyst arising from the left ovary.  Original Report Authenticated By: Deniece Portela, M.D.   US Pelvis Complete  03/12/2011   *RADIOLOGY REPORT*  Clinical Data: 40 year old G2 P2, LMP 02/25/2011, presenting with dysfunctional uterine bleeding, pelvic pain, and dyspareunia. Surgical history includes bilateral tubal ligation.  TRANSABDOMINAL AND TRANSVAGINAL ULTRASOUND OF PELVIS 03/12/2011:  Technique:  Both transabdominal and transvaginal ultrasound examinations of the pelvis were performed. Transabdominal technique was performed for global imaging of the pelvis including uterus, ovaries, adnexal regions, and pelvic cul-de-sac.  Comparison: CT abdomen and pelvis 10/28/2009 [place].  It was necessary to proceed with endovaginal exam following the transabdominal exam to visualize the endometrium and ovaries due to the incomplete urinary bladder distention.  Findings:  Uterus: Normal in size measuring approximately 8.8 x 4.4 x 5.8 cm. Fibroid involving the posterior uterine fundus approximating 1 cm in size.  Small Nabothian cyst on the cervix.  Endometrium: Normal in appearance measuring approximately 3 mm in thickness.  No endometrial fluid or mass.  Right ovary:  Normal in size measuring approximately 2.3 x 0.8 x 1.8 cm.  No dominant cyst or solid mass.  Left ovary: Normal in size measuring approximately 3.2 x 1.9 x 2.6 cm.  1.7 cm simple cyst and small follicular cysts within the ovary.  No solid masses.  Other findings: No free pelvic fluid.  IMPRESSION:  1.  Approximate 1 cm posterior uterine fundal fibroid. 2.  No significant abnormality otherwise.  Approximate 1.7 cm simple cyst arising from the left ovary.  Original Report Authenticated By: Deniece Portela,  M.D.    Assessment and Plan  40 y.o. [phone] with DUB Small fibroid and simple left ovarian cyst on u/s F/U with PCP for ongoing pain, f/u for CT of kidneys as scheduled  KEY,EVE M 03/12/2011, 5:08 PM   Belinda Fisher, NP 03/12/11 1728

## 2011-03-12 NOTE — ED Notes (Signed)
Eve Key R NP in room with pt

## 2011-03-12 NOTE — Progress Notes (Signed)
Left flank pain, abnormal vaginal bleeding, pelvic pain was seen by her family doctor yesterday, having bleeding between periods, history of chronic UTI, has Crohn's disease sees a doctor at [facility].

## 2011-03-15 LAB — GC/CHLAMYDIA PROBE AMP, GENITAL: GC Probe Amp, Genital: NEGATIVE

## 2011-06-28 DIAGNOSIS — Z87440 Personal history of urinary (tract) infections: Secondary | ICD-10-CM | POA: Diagnosis not present

## 2011-06-28 DIAGNOSIS — K624 Stenosis of anus and rectum: Secondary | ICD-10-CM | POA: Diagnosis not present

## 2011-06-28 DIAGNOSIS — Z79899 Other long term (current) drug therapy: Secondary | ICD-10-CM | POA: Diagnosis not present

## 2011-06-28 DIAGNOSIS — K5010 Crohn's disease of large intestine without complications: Secondary | ICD-10-CM | POA: Diagnosis not present

## 2011-06-28 DIAGNOSIS — K5080 Crohn's disease of both small and large intestine without complications: Secondary | ICD-10-CM | POA: Diagnosis not present

## 2011-07-06 DIAGNOSIS — Z87440 Personal history of urinary (tract) infections: Secondary | ICD-10-CM | POA: Diagnosis not present

## 2011-07-06 DIAGNOSIS — R3129 Other microscopic hematuria: Secondary | ICD-10-CM | POA: Diagnosis not present

## 2011-07-06 DIAGNOSIS — Z4682 Encounter for fitting and adjustment of non-vascular catheter: Secondary | ICD-10-CM | POA: Diagnosis not present

## 2011-07-06 DIAGNOSIS — Z79899 Other long term (current) drug therapy: Secondary | ICD-10-CM | POA: Diagnosis not present

## 2011-07-06 DIAGNOSIS — K5090 Crohn's disease, unspecified, without complications: Secondary | ICD-10-CM | POA: Diagnosis not present

## 2011-07-06 DIAGNOSIS — N390 Urinary tract infection, site not specified: Secondary | ICD-10-CM | POA: Diagnosis not present

## 2011-07-13 DIAGNOSIS — K5090 Crohn's disease, unspecified, without complications: Secondary | ICD-10-CM | POA: Diagnosis not present

## 2011-07-28 DIAGNOSIS — N390 Urinary tract infection, site not specified: Secondary | ICD-10-CM | POA: Diagnosis not present

## 2011-07-28 DIAGNOSIS — Z5309 Procedure and treatment not carried out because of other contraindication: Secondary | ICD-10-CM | POA: Diagnosis not present

## 2011-09-06 DIAGNOSIS — K5090 Crohn's disease, unspecified, without complications: Secondary | ICD-10-CM | POA: Diagnosis not present

## 2011-09-06 DIAGNOSIS — Z79899 Other long term (current) drug therapy: Secondary | ICD-10-CM | POA: Diagnosis not present

## 2011-09-06 DIAGNOSIS — K5080 Crohn's disease of both small and large intestine without complications: Secondary | ICD-10-CM | POA: Diagnosis not present

## 2011-09-07 DIAGNOSIS — Z87440 Personal history of urinary (tract) infections: Secondary | ICD-10-CM | POA: Diagnosis not present

## 2011-09-07 DIAGNOSIS — K5090 Crohn's disease, unspecified, without complications: Secondary | ICD-10-CM | POA: Diagnosis not present

## 2011-09-07 DIAGNOSIS — R3981 Functional urinary incontinence: Secondary | ICD-10-CM | POA: Diagnosis not present

## 2011-09-07 DIAGNOSIS — Z79899 Other long term (current) drug therapy: Secondary | ICD-10-CM | POA: Diagnosis not present

## 2011-09-08 DIAGNOSIS — K5090 Crohn's disease, unspecified, without complications: Secondary | ICD-10-CM | POA: Diagnosis not present

## 2011-09-08 DIAGNOSIS — N390 Urinary tract infection, site not specified: Secondary | ICD-10-CM | POA: Diagnosis not present

## 2011-09-08 DIAGNOSIS — Z87440 Personal history of urinary (tract) infections: Secondary | ICD-10-CM | POA: Diagnosis not present

## 2011-09-12 DIAGNOSIS — R159 Full incontinence of feces: Secondary | ICD-10-CM | POA: Diagnosis not present

## 2011-10-18 DIAGNOSIS — R197 Diarrhea, unspecified: Secondary | ICD-10-CM | POA: Diagnosis not present

## 2011-10-18 DIAGNOSIS — Z79899 Other long term (current) drug therapy: Secondary | ICD-10-CM | POA: Diagnosis not present

## 2011-10-18 DIAGNOSIS — R159 Full incontinence of feces: Secondary | ICD-10-CM | POA: Diagnosis not present

## 2011-10-18 DIAGNOSIS — K5000 Crohn's disease of small intestine without complications: Secondary | ICD-10-CM | POA: Diagnosis not present

## 2011-10-18 DIAGNOSIS — K5080 Crohn's disease of both small and large intestine without complications: Secondary | ICD-10-CM | POA: Diagnosis not present

## 2011-10-31 DIAGNOSIS — F988 Other specified behavioral and emotional disorders with onset usually occurring in childhood and adolescence: Secondary | ICD-10-CM | POA: Diagnosis not present

## 2011-10-31 DIAGNOSIS — F339 Major depressive disorder, recurrent, unspecified: Secondary | ICD-10-CM | POA: Diagnosis not present

## 2011-10-31 DIAGNOSIS — F411 Generalized anxiety disorder: Secondary | ICD-10-CM | POA: Diagnosis not present

## 2011-11-04 DIAGNOSIS — R159 Full incontinence of feces: Secondary | ICD-10-CM | POA: Diagnosis not present

## 2011-11-17 DIAGNOSIS — R159 Full incontinence of feces: Secondary | ICD-10-CM | POA: Diagnosis not present

## 2011-11-18 ENCOUNTER — Other Ambulatory Visit: Payer: Self-pay | Admitting: Family Medicine

## 2011-11-18 ENCOUNTER — Other Ambulatory Visit: Payer: Self-pay | Admitting: Obstetrics

## 2011-11-18 DIAGNOSIS — Z79899 Other long term (current) drug therapy: Secondary | ICD-10-CM | POA: Diagnosis not present

## 2011-11-18 DIAGNOSIS — M5137 Other intervertebral disc degeneration, lumbosacral region: Secondary | ICD-10-CM | POA: Diagnosis not present

## 2011-11-18 DIAGNOSIS — Z1231 Encounter for screening mammogram for malignant neoplasm of breast: Secondary | ICD-10-CM

## 2011-11-18 DIAGNOSIS — Z0000 Encounter for general adult medical examination without abnormal findings: Secondary | ICD-10-CM | POA: Diagnosis not present

## 2011-11-18 DIAGNOSIS — N390 Urinary tract infection, site not specified: Secondary | ICD-10-CM | POA: Diagnosis not present

## 2011-11-18 DIAGNOSIS — E559 Vitamin D deficiency, unspecified: Secondary | ICD-10-CM | POA: Diagnosis not present

## 2011-11-18 DIAGNOSIS — Z124 Encounter for screening for malignant neoplasm of cervix: Secondary | ICD-10-CM | POA: Diagnosis not present

## 2011-11-28 DIAGNOSIS — D259 Leiomyoma of uterus, unspecified: Secondary | ICD-10-CM | POA: Diagnosis not present

## 2011-11-28 DIAGNOSIS — N390 Urinary tract infection, site not specified: Secondary | ICD-10-CM | POA: Diagnosis not present

## 2011-11-28 DIAGNOSIS — F339 Major depressive disorder, recurrent, unspecified: Secondary | ICD-10-CM | POA: Diagnosis not present

## 2011-11-28 DIAGNOSIS — E669 Obesity, unspecified: Secondary | ICD-10-CM | POA: Diagnosis not present

## 2011-11-28 DIAGNOSIS — R350 Frequency of micturition: Secondary | ICD-10-CM | POA: Diagnosis not present

## 2011-11-28 DIAGNOSIS — N9489 Other specified conditions associated with female genital organs and menstrual cycle: Secondary | ICD-10-CM | POA: Diagnosis not present

## 2011-11-28 DIAGNOSIS — F411 Generalized anxiety disorder: Secondary | ICD-10-CM | POA: Diagnosis not present

## 2011-12-16 ENCOUNTER — Ambulatory Visit: Payer: Medicare Other

## 2011-12-20 DIAGNOSIS — T50901A Poisoning by unspecified drugs, medicaments and biological substances, accidental (unintentional), initial encounter: Secondary | ICD-10-CM | POA: Diagnosis not present

## 2011-12-20 DIAGNOSIS — Z87440 Personal history of urinary (tract) infections: Secondary | ICD-10-CM | POA: Diagnosis not present

## 2011-12-20 DIAGNOSIS — N390 Urinary tract infection, site not specified: Secondary | ICD-10-CM | POA: Diagnosis not present

## 2011-12-23 ENCOUNTER — Ambulatory Visit: Payer: Medicare Other

## 2012-01-17 DIAGNOSIS — K5090 Crohn's disease, unspecified, without complications: Secondary | ICD-10-CM | POA: Diagnosis not present

## 2012-01-17 DIAGNOSIS — K591 Functional diarrhea: Secondary | ICD-10-CM | POA: Diagnosis not present

## 2012-01-17 DIAGNOSIS — Z79899 Other long term (current) drug therapy: Secondary | ICD-10-CM | POA: Diagnosis not present

## 2012-02-21 DIAGNOSIS — N390 Urinary tract infection, site not specified: Secondary | ICD-10-CM | POA: Diagnosis not present

## 2012-02-21 DIAGNOSIS — R350 Frequency of micturition: Secondary | ICD-10-CM | POA: Diagnosis not present

## 2012-02-21 DIAGNOSIS — Z23 Encounter for immunization: Secondary | ICD-10-CM | POA: Diagnosis not present

## 2012-03-16 DIAGNOSIS — K5080 Crohn's disease of both small and large intestine without complications: Secondary | ICD-10-CM | POA: Diagnosis not present

## 2012-03-16 DIAGNOSIS — K603 Anal fistula: Secondary | ICD-10-CM | POA: Diagnosis not present

## 2012-03-16 DIAGNOSIS — Z79899 Other long term (current) drug therapy: Secondary | ICD-10-CM | POA: Diagnosis not present

## 2012-03-16 DIAGNOSIS — Z87440 Personal history of urinary (tract) infections: Secondary | ICD-10-CM | POA: Diagnosis not present

## 2012-03-16 DIAGNOSIS — K5090 Crohn's disease, unspecified, without complications: Secondary | ICD-10-CM | POA: Diagnosis not present

## 2012-07-03 DIAGNOSIS — K5090 Crohn's disease, unspecified, without complications: Secondary | ICD-10-CM | POA: Diagnosis not present

## 2012-07-03 DIAGNOSIS — K5080 Crohn's disease of both small and large intestine without complications: Secondary | ICD-10-CM | POA: Diagnosis not present

## 2012-07-03 DIAGNOSIS — K603 Anal fistula: Secondary | ICD-10-CM | POA: Diagnosis not present

## 2012-07-23 DIAGNOSIS — K5090 Crohn's disease, unspecified, without complications: Secondary | ICD-10-CM | POA: Diagnosis not present

## 2012-09-06 DIAGNOSIS — K5140 Inflammatory polyps of colon without complications: Secondary | ICD-10-CM | POA: Diagnosis not present

## 2012-09-06 DIAGNOSIS — D126 Benign neoplasm of colon, unspecified: Secondary | ICD-10-CM | POA: Diagnosis not present

## 2012-09-06 DIAGNOSIS — K6389 Other specified diseases of intestine: Secondary | ICD-10-CM | POA: Diagnosis not present

## 2012-09-06 DIAGNOSIS — K5080 Crohn's disease of both small and large intestine without complications: Secondary | ICD-10-CM | POA: Diagnosis not present

## 2012-10-10 DIAGNOSIS — E559 Vitamin D deficiency, unspecified: Secondary | ICD-10-CM | POA: Diagnosis not present

## 2012-10-10 DIAGNOSIS — E785 Hyperlipidemia, unspecified: Secondary | ICD-10-CM | POA: Diagnosis not present

## 2012-10-10 DIAGNOSIS — Z87440 Personal history of urinary (tract) infections: Secondary | ICD-10-CM | POA: Diagnosis not present

## 2012-10-10 DIAGNOSIS — R5381 Other malaise: Secondary | ICD-10-CM | POA: Diagnosis not present

## 2012-10-10 DIAGNOSIS — N390 Urinary tract infection, site not specified: Secondary | ICD-10-CM | POA: Diagnosis not present

## 2012-10-12 DIAGNOSIS — E559 Vitamin D deficiency, unspecified: Secondary | ICD-10-CM | POA: Diagnosis not present

## 2012-10-12 DIAGNOSIS — N390 Urinary tract infection, site not specified: Secondary | ICD-10-CM | POA: Diagnosis not present

## 2012-10-12 DIAGNOSIS — F39 Unspecified mood [affective] disorder: Secondary | ICD-10-CM | POA: Diagnosis not present

## 2012-10-12 DIAGNOSIS — F341 Dysthymic disorder: Secondary | ICD-10-CM | POA: Diagnosis not present

## 2012-10-12 DIAGNOSIS — M2550 Pain in unspecified joint: Secondary | ICD-10-CM | POA: Diagnosis not present

## 2012-10-12 DIAGNOSIS — K5090 Crohn's disease, unspecified, without complications: Secondary | ICD-10-CM | POA: Diagnosis not present

## 2012-10-25 DIAGNOSIS — M25569 Pain in unspecified knee: Secondary | ICD-10-CM | POA: Diagnosis not present

## 2012-10-25 DIAGNOSIS — M064 Inflammatory polyarthropathy: Secondary | ICD-10-CM | POA: Diagnosis not present

## 2012-10-25 DIAGNOSIS — M545 Low back pain, unspecified: Secondary | ICD-10-CM | POA: Diagnosis not present

## 2012-10-25 DIAGNOSIS — K5090 Crohn's disease, unspecified, without complications: Secondary | ICD-10-CM | POA: Diagnosis not present

## 2012-10-25 DIAGNOSIS — M130 Polyarthritis, unspecified: Secondary | ICD-10-CM | POA: Diagnosis not present

## 2012-10-25 DIAGNOSIS — M533 Sacrococcygeal disorders, not elsewhere classified: Secondary | ICD-10-CM | POA: Diagnosis not present

## 2012-10-25 DIAGNOSIS — M948X9 Other specified disorders of cartilage, unspecified sites: Secondary | ICD-10-CM | POA: Diagnosis not present

## 2012-10-25 DIAGNOSIS — M542 Cervicalgia: Secondary | ICD-10-CM | POA: Diagnosis not present

## 2012-11-08 DIAGNOSIS — M25569 Pain in unspecified knee: Secondary | ICD-10-CM | POA: Diagnosis not present

## 2012-11-08 DIAGNOSIS — IMO0001 Reserved for inherently not codable concepts without codable children: Secondary | ICD-10-CM | POA: Diagnosis not present

## 2012-11-13 DIAGNOSIS — Z202 Contact with and (suspected) exposure to infections with a predominantly sexual mode of transmission: Secondary | ICD-10-CM | POA: Diagnosis not present

## 2012-11-13 DIAGNOSIS — R300 Dysuria: Secondary | ICD-10-CM | POA: Diagnosis not present

## 2012-11-13 DIAGNOSIS — Z113 Encounter for screening for infections with a predominantly sexual mode of transmission: Secondary | ICD-10-CM | POA: Diagnosis not present

## 2012-11-13 DIAGNOSIS — B379 Candidiasis, unspecified: Secondary | ICD-10-CM | POA: Diagnosis not present

## 2012-11-13 DIAGNOSIS — N390 Urinary tract infection, site not specified: Secondary | ICD-10-CM | POA: Diagnosis not present

## 2012-12-04 DIAGNOSIS — K5080 Crohn's disease of both small and large intestine without complications: Secondary | ICD-10-CM | POA: Diagnosis not present

## 2012-12-13 DIAGNOSIS — IMO0001 Reserved for inherently not codable concepts without codable children: Secondary | ICD-10-CM | POA: Diagnosis not present

## 2012-12-13 DIAGNOSIS — M545 Low back pain, unspecified: Secondary | ICD-10-CM | POA: Diagnosis not present

## 2012-12-13 DIAGNOSIS — M25569 Pain in unspecified knee: Secondary | ICD-10-CM | POA: Diagnosis not present

## 2012-12-13 DIAGNOSIS — M542 Cervicalgia: Secondary | ICD-10-CM | POA: Diagnosis not present

## 2012-12-24 DIAGNOSIS — M2550 Pain in unspecified joint: Secondary | ICD-10-CM | POA: Diagnosis not present

## 2012-12-26 DIAGNOSIS — M2550 Pain in unspecified joint: Secondary | ICD-10-CM | POA: Diagnosis not present

## 2013-01-01 DIAGNOSIS — IMO0001 Reserved for inherently not codable concepts without codable children: Secondary | ICD-10-CM | POA: Diagnosis not present

## 2013-01-01 DIAGNOSIS — M542 Cervicalgia: Secondary | ICD-10-CM | POA: Diagnosis not present

## 2013-01-01 DIAGNOSIS — K5090 Crohn's disease, unspecified, without complications: Secondary | ICD-10-CM | POA: Diagnosis not present

## 2013-01-01 DIAGNOSIS — M545 Low back pain, unspecified: Secondary | ICD-10-CM | POA: Diagnosis not present

## 2013-01-02 DIAGNOSIS — M2550 Pain in unspecified joint: Secondary | ICD-10-CM | POA: Diagnosis not present

## 2013-01-10 DIAGNOSIS — M2550 Pain in unspecified joint: Secondary | ICD-10-CM | POA: Diagnosis not present

## 2013-01-15 DIAGNOSIS — M2550 Pain in unspecified joint: Secondary | ICD-10-CM | POA: Diagnosis not present

## 2013-01-18 DIAGNOSIS — M2550 Pain in unspecified joint: Secondary | ICD-10-CM | POA: Diagnosis not present

## 2013-05-03 DIAGNOSIS — F411 Generalized anxiety disorder: Secondary | ICD-10-CM | POA: Diagnosis not present

## 2013-05-03 DIAGNOSIS — N390 Urinary tract infection, site not specified: Secondary | ICD-10-CM | POA: Diagnosis not present

## 2013-05-03 DIAGNOSIS — R350 Frequency of micturition: Secondary | ICD-10-CM | POA: Diagnosis not present

## 2013-05-06 DIAGNOSIS — IMO0001 Reserved for inherently not codable concepts without codable children: Secondary | ICD-10-CM | POA: Diagnosis not present

## 2013-05-06 DIAGNOSIS — M545 Low back pain, unspecified: Secondary | ICD-10-CM | POA: Diagnosis not present

## 2013-05-06 DIAGNOSIS — M542 Cervicalgia: Secondary | ICD-10-CM | POA: Diagnosis not present

## 2013-05-06 DIAGNOSIS — M25569 Pain in unspecified knee: Secondary | ICD-10-CM | POA: Diagnosis not present

## 2013-07-02 DIAGNOSIS — R0789 Other chest pain: Secondary | ICD-10-CM | POA: Diagnosis not present

## 2013-07-02 DIAGNOSIS — R079 Chest pain, unspecified: Secondary | ICD-10-CM | POA: Diagnosis not present

## 2013-07-02 DIAGNOSIS — M064 Inflammatory polyarthropathy: Secondary | ICD-10-CM | POA: Diagnosis not present

## 2013-07-02 DIAGNOSIS — M940 Chondrocostal junction syndrome [Tietze]: Secondary | ICD-10-CM | POA: Diagnosis not present

## 2013-07-02 DIAGNOSIS — M25569 Pain in unspecified knee: Secondary | ICD-10-CM | POA: Diagnosis not present

## 2013-07-02 DIAGNOSIS — K5090 Crohn's disease, unspecified, without complications: Secondary | ICD-10-CM | POA: Diagnosis not present

## 2013-07-02 DIAGNOSIS — IMO0001 Reserved for inherently not codable concepts without codable children: Secondary | ICD-10-CM | POA: Diagnosis not present

## 2013-07-23 DIAGNOSIS — K603 Anal fistula: Secondary | ICD-10-CM | POA: Diagnosis not present

## 2013-07-23 DIAGNOSIS — K5080 Crohn's disease of both small and large intestine without complications: Secondary | ICD-10-CM | POA: Diagnosis not present

## 2013-07-23 DIAGNOSIS — K5090 Crohn's disease, unspecified, without complications: Secondary | ICD-10-CM | POA: Diagnosis not present

## 2013-07-23 DIAGNOSIS — K624 Stenosis of anus and rectum: Secondary | ICD-10-CM | POA: Diagnosis not present

## 2013-07-23 DIAGNOSIS — IMO0001 Reserved for inherently not codable concepts without codable children: Secondary | ICD-10-CM | POA: Diagnosis not present

## 2013-08-19 DIAGNOSIS — S63509A Unspecified sprain of unspecified wrist, initial encounter: Secondary | ICD-10-CM | POA: Diagnosis not present

## 2013-08-22 DIAGNOSIS — S63509A Unspecified sprain of unspecified wrist, initial encounter: Secondary | ICD-10-CM | POA: Diagnosis not present

## 2013-08-22 DIAGNOSIS — G5600 Carpal tunnel syndrome, unspecified upper limb: Secondary | ICD-10-CM | POA: Diagnosis not present

## 2013-10-07 ENCOUNTER — Encounter (HOSPITAL_COMMUNITY): Payer: Self-pay | Admitting: Emergency Medicine

## 2013-10-07 ENCOUNTER — Emergency Department (HOSPITAL_COMMUNITY)
Admission: EM | Admit: 2013-10-07 | Discharge: 2013-10-07 | Disposition: A | Discharge status: Home / Self Care | Payer: Medicare Other | Attending: Emergency Medicine | Admitting: Emergency Medicine

## 2013-10-07 DIAGNOSIS — Z87440 Personal history of urinary (tract) infections: Secondary | ICD-10-CM | POA: Insufficient documentation

## 2013-10-07 DIAGNOSIS — Z9889 Other specified postprocedural states: Secondary | ICD-10-CM | POA: Insufficient documentation

## 2013-10-07 DIAGNOSIS — R319 Hematuria, unspecified: Secondary | ICD-10-CM | POA: Diagnosis not present

## 2013-10-07 DIAGNOSIS — N393 Stress incontinence (female) (male): Secondary | ICD-10-CM | POA: Insufficient documentation

## 2013-10-07 DIAGNOSIS — Z9851 Tubal ligation status: Secondary | ICD-10-CM | POA: Diagnosis not present

## 2013-10-07 DIAGNOSIS — M129 Arthropathy, unspecified: Secondary | ICD-10-CM | POA: Insufficient documentation

## 2013-10-07 DIAGNOSIS — R350 Frequency of micturition: Secondary | ICD-10-CM | POA: Diagnosis not present

## 2013-10-07 DIAGNOSIS — R51 Headache: Secondary | ICD-10-CM | POA: Insufficient documentation

## 2013-10-07 DIAGNOSIS — K5010 Crohn's disease of large intestine without complications: Secondary | ICD-10-CM | POA: Diagnosis not present

## 2013-10-07 DIAGNOSIS — R42 Dizziness and giddiness: Secondary | ICD-10-CM | POA: Insufficient documentation

## 2013-10-07 DIAGNOSIS — Z8639 Personal history of other endocrine, nutritional and metabolic disease: Secondary | ICD-10-CM | POA: Insufficient documentation

## 2013-10-07 DIAGNOSIS — Z792 Long term (current) use of antibiotics: Secondary | ICD-10-CM | POA: Diagnosis not present

## 2013-10-07 DIAGNOSIS — Z79899 Other long term (current) drug therapy: Secondary | ICD-10-CM | POA: Insufficient documentation

## 2013-10-07 DIAGNOSIS — F3289 Other specified depressive episodes: Secondary | ICD-10-CM | POA: Diagnosis not present

## 2013-10-07 DIAGNOSIS — K5090 Crohn's disease, unspecified, without complications: Secondary | ICD-10-CM

## 2013-10-07 DIAGNOSIS — R109 Unspecified abdominal pain: Secondary | ICD-10-CM

## 2013-10-07 DIAGNOSIS — R61 Generalized hyperhidrosis: Secondary | ICD-10-CM | POA: Insufficient documentation

## 2013-10-07 DIAGNOSIS — G8929 Other chronic pain: Secondary | ICD-10-CM | POA: Insufficient documentation

## 2013-10-07 DIAGNOSIS — M549 Dorsalgia, unspecified: Secondary | ICD-10-CM | POA: Diagnosis not present

## 2013-10-07 DIAGNOSIS — Z862 Personal history of diseases of the blood and blood-forming organs and certain disorders involving the immune mechanism: Secondary | ICD-10-CM | POA: Diagnosis not present

## 2013-10-07 DIAGNOSIS — Z3202 Encounter for pregnancy test, result negative: Secondary | ICD-10-CM | POA: Diagnosis not present

## 2013-10-07 DIAGNOSIS — F329 Major depressive disorder, single episode, unspecified: Secondary | ICD-10-CM | POA: Insufficient documentation

## 2013-10-07 LAB — COMPREHENSIVE METABOLIC PANEL
ALT: 12 U/L (ref 0–35)
AST: 20 U/L (ref 0–37)
Albumin: 3.6 g/dL (ref 3.5–5.2)
Alkaline Phosphatase: 69 U/L (ref 39–117)
BUN: 16 mg/dL (ref 6–23)
CALCIUM: 9.5 mg/dL (ref 8.4–10.5)
CO2: 24 meq/L (ref 19–32)
Chloride: 101 mEq/L (ref 96–112)
Creatinine, Ser: 0.67 mg/dL (ref 0.50–1.10)
Glucose, Bld: 84 mg/dL (ref 70–99)
Potassium: 4.1 mEq/L (ref 3.7–5.3)
SODIUM: 137 meq/L (ref 137–147)
TOTAL PROTEIN: 7.4 g/dL (ref 6.0–8.3)
Total Bilirubin: 0.2 mg/dL — ABNORMAL LOW (ref 0.3–1.2)

## 2013-10-07 LAB — WET PREP, GENITAL
Clue Cells Wet Prep HPF POC: NONE SEEN
Trich, Wet Prep: NONE SEEN
WBC, Wet Prep HPF POC: NONE SEEN
Yeast Wet Prep HPF POC: NONE SEEN

## 2013-10-07 LAB — CBC WITH DIFFERENTIAL/PLATELET
Basophils Absolute: 0 K/uL (ref 0.0–0.1)
Basophils Relative: 0 % (ref 0–1)
Eosinophils Absolute: 0.2 K/uL (ref 0.0–0.7)
Eosinophils Relative: 2 % (ref 0–5)
HCT: 39.2 % (ref 36.0–46.0)
Hemoglobin: 12.8 g/dL (ref 12.0–15.0)
Lymphocytes Relative: 30 % (ref 12–46)
Lymphs Abs: 2.7 K/uL (ref 0.7–4.0)
MCH: 29.4 pg (ref 26.0–34.0)
MCHC: 32.7 g/dL (ref 30.0–36.0)
MCV: 90.1 fL (ref 78.0–100.0)
Monocytes Absolute: 0.7 K/uL (ref 0.1–1.0)
Monocytes Relative: 8 % (ref 3–12)
Neutro Abs: 5.5 K/uL (ref 1.7–7.7)
Neutrophils Relative %: 60 % (ref 43–77)
Platelets: 226 K/uL (ref 150–400)
RBC: 4.35 MIL/uL (ref 3.87–5.11)
RDW: 13.9 % (ref 11.5–15.5)
WBC: 9.1 K/uL (ref 4.0–10.5)

## 2013-10-07 LAB — LIPASE, BLOOD: Lipase: 43 U/L (ref 11–59)

## 2013-10-07 LAB — URINALYSIS, ROUTINE W REFLEX MICROSCOPIC
Bilirubin Urine: NEGATIVE
Glucose, UA: NEGATIVE mg/dL
Ketones, ur: NEGATIVE mg/dL
Leukocytes, UA: NEGATIVE
Nitrite: NEGATIVE
Protein, ur: NEGATIVE mg/dL
Specific Gravity, Urine: 1.031 — ABNORMAL HIGH (ref 1.005–1.030)
Urobilinogen, UA: 0.2 mg/dL (ref 0.0–1.0)
pH: 5 (ref 5.0–8.0)

## 2013-10-07 LAB — URINE MICROSCOPIC-ADD ON

## 2013-10-07 LAB — POC URINE PREG, ED: Preg Test, Ur: NEGATIVE

## 2013-10-07 MED ORDER — SODIUM CHLORIDE 0.9 % IV BOLUS (SEPSIS)
1000.0000 mL | Freq: Once | INTRAVENOUS | Status: AC
  Administered 2013-10-07: 1000 mL via INTRAVENOUS

## 2013-10-07 NOTE — ED Notes (Signed)
Pt tolerating ginger ale

## 2013-10-07 NOTE — ED Provider Notes (Signed)
Medical screening examination/treatment/procedure(s) were performed by non-physician practitioner and as supervising physician I was immediately available for consultation/collaboration.   EKG Interpretation At time 2014 shows sinus arrythmia at rate 73, otherwise normal ECG.          Saddie Benders. Dorna Mai, MD 10/07/13 2258

## 2013-10-07 NOTE — Discharge Instructions (Signed)
Drink plenty of fluids and rest - stay hydrated as much as possible  Follow-up with your GI doctor and primary doctor - call to get earlier appointments  Return to the emergency department if you develop any changing/worsening condition, fever, worsening or changed abdominal pain, repeated vomiting, passing out, chest pain, or any other concerns (please read additional information regarding your condition below)   Abdominal Pain, Women Abdominal (stomach, pelvic, or belly) pain can be caused by many things. It is important to tell your doctor:  The location of the pain.  Does it come and go or is it present all the time?  Are there things that start the pain (eating certain foods, exercise)?  Are there other symptoms associated with the pain (fever, nausea, vomiting, diarrhea)? All of this is helpful to know when trying to find the cause of the pain. CAUSES   Stomach: virus or bacteria infection, or ulcer.  Intestine: appendicitis (inflamed appendix), regional ileitis (Crohn's disease), ulcerative colitis (inflamed colon), irritable bowel syndrome, diverticulitis (inflamed diverticulum of the colon), or cancer of the stomach or intestine.  Gallbladder disease or stones in the gallbladder.  Kidney disease, kidney stones, or infection.  Pancreas infection or cancer.  Fibromyalgia (pain disorder).  Diseases of the female organs:  Uterus: fibroid (non-cancerous) tumors or infection.  Fallopian tubes: infection or tubal pregnancy.  Ovary: cysts or tumors.  Pelvic adhesions (scar tissue).  Endometriosis (uterus lining tissue growing in the pelvis and on the pelvic organs).  Pelvic congestion syndrome (female organs filling up with blood just before the menstrual period).  Pain with the menstrual period.  Pain with ovulation (producing an egg).  Pain with an IUD (intrauterine device, birth control) in the uterus.  Cancer of the female organs.  Functional pain (pain not  caused by a disease, may improve without treatment).  Psychological pain.  Depression. DIAGNOSIS  Your doctor will decide the seriousness of your pain by doing an examination.  Blood tests.  X-rays.  Ultrasound.  CT scan (computed tomography, special type of X-ray).  MRI (magnetic resonance imaging).  Cultures, for infection.  Barium enema (dye inserted in the large intestine, to better view it with X-rays).  Colonoscopy (looking in intestine with a lighted tube).  Laparoscopy (minor surgery, looking in abdomen with a lighted tube).  Major abdominal exploratory surgery (looking in abdomen with a large incision). TREATMENT  The treatment will depend on the cause of the pain.   Many cases can be observed and treated at home.  Over-the-counter medicines recommended by your caregiver.  Prescription medicine.  Antibiotics, for infection.  Birth control pills, for painful periods or for ovulation pain.  Hormone treatment, for endometriosis.  Nerve blocking injections.  Physical therapy.  Antidepressants.  Counseling with a psychologist or psychiatrist.  Minor or major surgery. HOME CARE INSTRUCTIONS   Do not take laxatives, unless directed by your caregiver.  Take over-the-counter pain medicine only if ordered by your caregiver. Do not take aspirin because it can cause an upset stomach or bleeding.  Try a clear liquid diet (broth or water) as ordered by your caregiver. Slowly move to a bland diet, as tolerated, if the pain is related to the stomach or intestine.  Have a thermometer and take your temperature several times a day, and record it.  Bed rest and sleep, if it helps the pain.  Avoid sexual intercourse, if it causes pain.  Avoid stressful situations.  Keep your follow-up appointments and tests, as your caregiver orders.  If the pain does not go away with medicine or surgery, you may try:  Acupuncture.  Relaxation exercises (yoga,  meditation).  Group therapy.  Counseling. SEEK MEDICAL CARE IF:   You notice certain foods cause stomach pain.  Your home care treatment is not helping your pain.  You need stronger pain medicine.  You want your IUD removed.  You feel faint or lightheaded.  You develop nausea and vomiting.  You develop a rash.  You are having side effects or an allergy to your medicine. SEEK IMMEDIATE MEDICAL CARE IF:   Your pain does not go away or gets worse.  You have a fever.  Your pain is felt only in portions of the abdomen. The right side could possibly be appendicitis. The left lower portion of the abdomen could be colitis or diverticulitis.  You are passing blood in your stools (bright red or black tarry stools, with or without vomiting).  You have blood in your urine.  You develop chills, with or without a fever.  You pass out. MAKE SURE YOU:   Understand these instructions.  Will watch your condition.  Will get help right away if you are not doing well or get worse. Document Released: 04/03/2007 Document Revised: 08/29/2011 Document Reviewed: 04/23/2009 [facility] Patient Information 2014 [place], [place].  Pelvic Pain, Female Female pelvic pain can be caused by many different things and start from a variety of places. Pelvic pain refers to pain that is located in the lower half of the abdomen and between your hips. The pain may occur over a short period of time (acute) or may be reoccurring (chronic). The cause of pelvic pain may be related to disorders affecting the female reproductive organs (gynecologic), but it may also be related to the bladder, kidney stones, an intestinal complication, or muscle or skeletal problems. Getting help right away for pelvic pain is important, especially if there has been severe, sharp, or a sudden onset of unusual pain. It is also important to get help right away because some types of pelvic pain can be life threatening.  CAUSES  Below are  only some of the causes of pelvic pain. The causes of pelvic pain can be in one of several categories.   Gynecologic.  Pelvic inflammatory disease.  Sexually transmitted infection.  Ovarian cyst or a twisted ovarian ligament (ovarian torsion).  Uterine lining that grows outside the uterus (endometriosis).  Fibroids, cysts, or tumors.  Ovulation.  Pregnancy.  Pregnancy that occurs outside the uterus (ectopic pregnancy).  Miscarriage.  Labor.  Abruption of the placenta or ruptured uterus.  Infection.  Uterine infection (endometritis).  Bladder infection.  Diverticulitis.  Miscarriage related to a uterine infection (septic abortion).  Bladder.  Inflammation of the bladder (cystitis).  Kidney stone(s).  Gastrointenstinal.  Constipation.  Diverticulitis.  Neurologic.  Trauma.  Feeling pelvic pain because of mental or emotional causes (psychosomatic).  Cancers of the bowel or pelvis. EVALUATION  Your caregiver will want to take a careful history of your concerns. This includes recent changes in your health, a careful gynecologic history of your periods (menses), and a sexual history. Obtaining your family history and medical history is also important. Your caregiver may suggest a pelvic exam. A pelvic exam will help identify the location and severity of the pain. It also helps in the evaluation of which organ system may be involved. In order to identify the cause of the pelvic pain and be properly treated, your caregiver may order tests. These tests may include:  A pregnancy test.  Pelvic ultrasonography.  An X-ray exam of the abdomen.  A urinalysis or evaluation of vaginal discharge.  Blood tests. HOME CARE INSTRUCTIONS   Only take over-the-counter or prescription medicines for pain, discomfort, or fever as directed by your caregiver.   Rest as directed by your caregiver.   Eat a balanced diet.   Drink enough fluids to make your urine clear or  pale yellow, or as directed.   Avoid sexual intercourse if it causes pain.   Apply warm or cold compresses to the lower abdomen depending on which one helps the pain.   Avoid stressful situations.   Keep a journal of your pelvic pain. Write down when it started, where the pain is located, and if there are things that seem to be associated with the pain, such as food or your menstrual cycle.  Follow up with your caregiver as directed.  SEEK MEDICAL CARE IF:  Your medicine does not help your pain.  You have abnormal vaginal discharge. SEEK IMMEDIATE MEDICAL CARE IF:   You have heavy bleeding from the vagina.   Your pelvic pain increases.   You feel lightheaded or faint.   You have chills.   You have pain with urination or blood in your urine.   You have uncontrolled diarrhea or vomiting.   You have a fever or persistent symptoms for more than 3 days.  You have a fever and your symptoms suddenly get worse.   You are being physically or sexually abused.  MAKE SURE YOU:  Understand these instructions.  Will watch your condition.  Will get help if you are not doing well or get worse. Document Released: 05/03/2004 Document Revised: 12/06/2011 Document Reviewed: 09/26/2011 [facility] Patient Information 2014 [place], [place].  Crohn's Disease Crohn's disease is a long-term (chronic) soreness and redness (inflammation) of the intestines (bowel). It can affect any portion of the digestive tract, from the mouth to the anus. It can also cause problems outside the digestive tract. Crohn's disease is closely related to a disease called ulcerative colitis (together, these two diseases are called inflammatory bowel disease).  CAUSES  The cause of Crohn's disease is not known. [employer]uffer is that, in an easily affected person, the immune system is triggered to attack the body's own digestive tissue. Crohn's disease runs in families. It seems to be more common in certain  geographic areas and amongst certain races. There are no clear-cut dietary causes.  SYMPTOMS  Crohn's disease can cause many different symptoms since it can affect many different parts of the body. Symptoms include:  Fatigue.  Weight loss.  Chronic diarrhea, sometime bloody.  Abdominal pain and cramps.  Fever.  Ulcers or canker sores in the mouth or rectum.  Anemia (low red blood cells).  Arthritis, skin problems, and eye problems may occur. Complications of Crohn's disease can include:  Series of holes (perforation) of the bowel.  Portions of the intestines sticking to each other (adhesions).  Obstruction of the bowel.  Fistula formation, typically in the rectal area but also in other areas. A fistula is an opening between the bowels and the outside, or between the bowels and another organ.  A painful crack in the mucous membrane of the anus (rectal fissure). DIAGNOSIS  Your caregiver may suspect Crohn's disease based on your symptoms and an exam. Blood tests may confirm that there is a problem. You may be asked to submit a stool specimen for examination. X-rays and CT scans may be necessary. Ultimately,  the diagnosis is usually made after a procedure that uses a flexible tube that is inserted via your mouth or your anus. This is done under sedation and is called either an upper endoscopy or colonoscopy. With these tests, the specialist can take tiny tissue samples and remove them from the inside of the bowel (biopsy). Examination of this biopsy tissue under a microscope can reveal Crohn's disease as the cause of your symptoms. Due to the many different forms that Crohn's disease can take, symptoms may be present for several years before a diagnosis is made. TREATMENT  Medications are often used to decrease inflammation and control the immune system. These include medicines related to aspirin, steroid medications, and newer and stronger medications to slow down the immune system.  Some medications may be used as suppositories or enemas. A number of other medications are used or have been studied. Your caregiver will make specific recommendations. HOME CARE INSTRUCTIONS   Symptoms such as diarrhea can be controlled with medications. Avoid foods that have a laxative effect such as fresh fruit, vegetables and dairy products. During flare ups, you can rest your bowel by refraining from solid foods. Drink clear liquids frequently during the day (electrolyte or re-hydrating fluids are best. Your caregiver can help you with suggestions). Drink often to prevent loss of body fluids (dehydration). When diarrhea has cleared, eat small meals and more frequently. Avoid food additives and stimulants such as caffeine (coffee, tea, or chocolate). Enzyme supplements may help if you develop intolerance to a sugar in dairy products (lactose). Ask your caregiver or dietitian about specific dietary instructions.  Try to maintain a positive attitude. Learn relaxation techniques such as self hypnosis, mental imaging, and muscle relaxation.  If possible, avoid stresses which can aggravate your condition.  Exercise regularly.  Follow your diet.  Always get plenty of rest. SEEK MEDICAL CARE IF:   Your symptoms fail to improve after a week or two of new treatment.  You experience continued weight loss.  You have ongoing cramps or loose bowels.  You develop a new skin rash, skin sores, or eye problems. SEEK IMMEDIATE MEDICAL CARE IF:   You have worsening of your symptoms or develop new symptoms.  You have a fever.  You develop bloody diarrhea.  You develop severe abdominal pain. MAKE SURE YOU:   Understand these instructions.  Will watch your condition.  Will get help right away if you are not doing well or get worse. Document Released: 03/16/2005 Document Revised: 10/01/2012 Document Reviewed: 02/12/2007 [facility] Patient Information 2014 [place], [place].  Hematuria,  Adult Hematuria is blood in your urine. It can be caused by a bladder infection, kidney infection, prostate infection, kidney stone, or cancer of your urinary tract. Infections can usually be treated with medicine, and a kidney stone usually will pass through your urine. If neither of these is the cause of your hematuria, further workup to find out the reason may be needed. It is very important that you tell your health care provider about any blood you see in your urine, even if the blood stops without treatment or happens without causing pain. Blood in your urine that happens and then stops and then happens again can be a symptom of a very serious condition. Also, pain is not a symptom in the initial stages of many urinary cancers. HOME CARE INSTRUCTIONS   Drink lots of fluid, 3 4 quarts a day. If you have been diagnosed with an infection, cranberry juice is especially recommended, in addition  to large amounts of water.  Avoid caffeine, tea, and carbonated beverages, because they tend to irritate the bladder.  Avoid alcohol because it may irritate the prostate.  Only take over-the-counter or prescription medicines for pain, discomfort, or fever as directed by your health care provider.  If you have been diagnosed with a kidney stone, follow your health care provider's instructions regarding straining your urine to catch the stone.  Empty your bladder often. Avoid holding urine for long periods of time.  After a bowel movement, women should cleanse front to back. Use each tissue only once.  Empty your bladder before and after sexual intercourse if you are a female. SEEK MEDICAL CARE IF: You develop back pain, fever, a feeling of sickness in your stomach (nausea), or vomiting or if your symptoms are not better in 3 days. Return sooner if you are getting worse. SEEK IMMEDIATE MEDICAL CARE IF:   You have a persistent fever, with a temperature of 101.31F (38.8C) or greater.  You develop  severe vomiting and are unable to keep the medicine down.  You develop severe back or abdominal pain despite taking your medicines.  You begin passing a large amount of blood or clots in your urine.  You feel extremely weak or faint, or you pass out. MAKE SURE YOU:   Understand these instructions.  Will watch your condition.  Will get help right away if you are not doing well or get worse. Document Released: 06/06/2005 Document Revised: 03/27/2013 Document Reviewed: 02/04/2013 [facility] Patient Information 2014 [place].  Dizziness Dizziness is a common problem. It is a feeling of unsteadiness or lightheadedness. You may feel like you are about to faint. Dizziness can lead to injury if you stumble or fall. A person of any age group can suffer from dizziness, but dizziness is more common in older adults. CAUSES  Dizziness can be caused by many different things, including:  Middle ear problems.  Standing for too long.  Infections.  An allergic reaction.  Aging.  An emotional response to something, such as the sight of blood.  Side effects of medicines.  Fatigue.  Problems with circulation or blood pressure.  Excess use of alcohol, medicines, or illegal drug use.  Breathing too fast (hyperventilation).  An arrhythmia or problems with your heart rhythm.  Low red blood cell count (anemia).  Pregnancy.  Vomiting, diarrhea, fever, or other illnesses that cause dehydration.  Diseases or conditions such as Parkinson's disease, high blood pressure (hypertension), diabetes, and thyroid problems.  Exposure to extreme heat. DIAGNOSIS  To find the cause of your dizziness, your caregiver may do a physical exam, lab tests, radiologic imaging scans, or an electrocardiography test (ECG).  TREATMENT  Treatment of dizziness depends on the cause of your symptoms and can vary greatly. HOME CARE INSTRUCTIONS   Drink enough fluids to keep your urine clear or pale yellow.  This is especially important in very hot weather. In the elderly, it is also important in cold weather.  If your dizziness is caused by medicines, take them exactly as directed. When taking blood pressure medicines, it is especially important to get up slowly.  Rise slowly from chairs and steady yourself until you feel okay.  In the morning, first sit up on the side of the bed. When this seems okay, stand slowly while holding onto something until you know your balance is fine.  If you need to stand in one place for a long time, be sure to move your legs often.  Tighten and relax the muscles in your legs while standing.  If dizziness continues to be a problem, have someone stay with you for a day or two. Do this until you feel you are well enough to stay alone. Have the person call your caregiver if he or she notices changes in you that are concerning.  Do not drive or use heavy machinery if you feel dizzy.  Do not drink alcohol. SEEK IMMEDIATE MEDICAL CARE IF:   Your dizziness or lightheadedness gets worse.  You feel nauseous or vomit.  You develop problems with talking, walking, weakness, or using your arms, hands, or legs.  You are not thinking clearly or you have difficulty forming sentences. It may take a friend or family member to determine if your thinking is normal.  You develop chest pain, abdominal pain, shortness of breath, or sweating.  Your vision changes.  You notice any bleeding.  You have side effects from medicine that seems to be getting worse rather than better. MAKE SURE YOU:   Understand these instructions.  Will watch your condition.  Will get help right away if you are not doing well or get worse. Document Released: 11/30/2000 Document Revised: 08/29/2011 Document Reviewed: 12/24/2010 [facility] Patient Information 2014 [place], [place].  Dehydration, Adult Dehydration is when you lose more fluids from the body than you take in. Vital organs like the  kidneys, brain, and heart cannot function without a proper amount of fluids and salt. Any loss of fluids from the body can cause dehydration.  CAUSES   Vomiting.  Diarrhea.  Excessive sweating.  Excessive urine output.  Fever. SYMPTOMS  Mild dehydration  Thirst.  Dry lips.  Slightly dry mouth. Moderate dehydration  Very dry mouth.  Sunken eyes.  Skin does not bounce back quickly when lightly pinched and released.  Dark urine and decreased urine production.  Decreased tear production.  Headache. Severe dehydration  Very dry mouth.  Extreme thirst.  Rapid, weak pulse (more than 100 beats per minute at rest).  Cold hands and feet.  Not able to sweat in spite of heat and temperature.  Rapid breathing.  Blue lips.  Confusion and lethargy.  Difficulty being awakened.  Minimal urine production.  No tears. DIAGNOSIS  Your caregiver will diagnose dehydration based on your symptoms and your exam. Blood and urine tests will help confirm the diagnosis. The diagnostic evaluation should also identify the cause of dehydration. TREATMENT  Treatment of mild or moderate dehydration can often be done at home by increasing the amount of fluids that you drink. It is best to drink small amounts of fluid more often. Drinking too much at one time can make vomiting worse. Refer to the home care instructions below. Severe dehydration needs to be treated at the hospital where you will probably be given intravenous (IV) fluids that contain water and electrolytes. HOME CARE INSTRUCTIONS   Ask your caregiver about specific rehydration instructions.  Drink enough fluids to keep your urine clear or pale yellow.  Drink small amounts frequently if you have nausea and vomiting.  Eat as you normally do.  Avoid:  Foods or drinks high in sugar.  Carbonated drinks.  Juice.  Extremely hot or cold fluids.  Drinks with caffeine.  Fatty, greasy  foods.  Alcohol.  Tobacco.  Overeating.  Gelatin desserts.  Wash your hands well to avoid spreading bacteria and viruses.  Only take over-the-counter or prescription medicines for pain, discomfort, or fever as directed by your caregiver.  Ask your caregiver  if you should continue all prescribed and over-the-counter medicines.  Keep all follow-up appointments with your caregiver. SEEK MEDICAL CARE IF:  You have abdominal pain and it increases or stays in one area (localizes).  You have a rash, stiff neck, or severe headache.  You are irritable, sleepy, or difficult to awaken.  You are weak, dizzy, or extremely thirsty. SEEK IMMEDIATE MEDICAL CARE IF:   You are unable to keep fluids down or you get worse despite treatment.  You have frequent episodes of vomiting or diarrhea.  You have blood or green matter (bile) in your vomit.  You have blood in your stool or your stool looks black and tarry.  You have not urinated in 6 to 8 hours, or you have only urinated a small amount of very dark urine.  You have a fever.  You faint. MAKE SURE YOU:   Understand these instructions.  Will watch your condition.  Will get help right away if you are not doing well or get worse. Document Released: 06/06/2005 Document Revised: 08/29/2011 Document Reviewed: 01/24/2011 [facility] Patient Information 2014 [place], [place].   Emergency Department Resource Guide 1) Find a Doctor and Pay Out of Pocket Although you won't have to find out who is covered by your insurance plan, it is a good idea to ask around and get recommendations. You will then need to call the office and see if the doctor you have chosen will accept you as a new patient and what types of options they offer for patients who are self-pay. Some doctors offer discounts or will set up payment plans for their patients who do not have insurance, but you will need to ask so you aren't surprised when you get to your  appointment.  2) Contact Your Local Health Department Not all health departments have doctors that can see patients for sick visits, but many do, so it is worth a call to see if yours does. If you don't know where your local health department is, you can check in your phone book. The CDC also has a tool to help you locate your state's health department, and many state websites also have listings of all of their local health departments.  3) Find a [facility] If your illness is not likely to be very severe or complicated, you may want to try a walk in clinic. These are popping up all over the country in pharmacies, drugstores, and shopping centers. They're usually staffed by nurse practitioners or physician assistants that have been trained to treat common illnesses and complaints. They're usually fairly quick and inexpensive. However, if you have serious medical issues or chronic medical problems, these are probably not your best option.  No Primary Care Doctor: - Call [facility] at  [phone] - they can help you locate a primary care doctor that  accepts your insurance, provides certain services, etc. - Physician Referral Service- [phone]  Chronic Pain Problems: Organization         Address  Phone   Notes  [facility]  [phone] Patients need to be referred by their primary care doctor.   Medication Assistance: Organization         Address  Phone   Notes  [facility] Medication [facility] [address] [place], [place] [address] [phone] --Must be a resident of [facility] -- Must have NO insurance coverage whatsoever (no Medicaid/ Medicare, etc.) -- The pt. MUST have a primary care doctor that  directs their care regularly and follows them in the community   [facility]  [phone]   [employer]  [phone]    Agencies that provide inexpensive medical care: Organization         Address  Phone   Notes  [place]  [phone]   [facility]    [phone]   [facility] [place], [place] [address] [phone]   [place] [address], [place] [phone]   Planned Parenthood    [phone]   [facility]    [phone]   [facility] [address], [place] Phone:  [phone], Fax:  [phone] Hours of Operation:  9 am - 6 pm, M-F.  Also accepts Medicaid/Medicare and self-pay.  [facility] for [place], [address], [place] Phone: [phone], Fax: [phone]. Hours of Operation:  8:30 am - 5:30 pm, M-F.  Also accepts Medicaid and self-pay.  [facility] [address], [place] Phone: [phone]   [place], [place], [place] [phone], Ext. [phone] Mondays & Thursdays: 7-9 AM.  First 15 patients are seen on a first come, first serve basis.    [place] Providers:  Organization         Address  Phone   Notes  [facility] [address], [place] [phone] Also accepts self-pay patients.  [facility] [address], [place]  [phone]   [place], [address], [place] [phone]   [facility] [address], [place] [phone]   Lucianne Lei [address], [place]   [phone] Only accepts [place] Access [place] patients after they have their name applied to their card.   Self-Pay (no insurance) in [facility]:  Organization         Address  Phone   Notes  Sickle Cell Patients, [facility] [place] [phone]   [facility] [phone]   [facility]  [place], [place], [place] [phone]   [facility]/Dr. Osei-Bonsu  [address] [place] or [address], [place] [phone] Phone number for both [place] and [place] locations is the same.  [facility] [address] [place] [phone]   [facility] [address], [place] or [address] [phone]   [facility] [address] [place] [phone], phone; [phone], fax Sees patients 1st and 3rd Saturday of every month.  Must not qualify for public or private insurance (i.e. Medicaid, Medicare,  Health Choice, Veterans' Benefits)  Household income should be no more than 200% of the poverty level The clinic cannot treat you if you are pregnant or think you are pregnant  Sexually transmitted diseases are not treated at the clinic.    Dental Care: Organization         Address  Phone  Notes  [facility] [address] [place], [place] [phone] Accepts children up  to age 49 who are enrolled in Medicaid or [place] Health Choice; pregnant women with a Medicaid card; and children who have applied for Medicaid or [place] Health Choice, but were declined, whose parents can pay a reduced fee at time of service.  [facility]  [address], [place] [phone] Accepts children up to age 38 who are enrolled in [place] or [place]; pregnant women with a Medicaid card; and children who have applied for Medicaid or [place] Health Choice, but were declined, whose parents can pay a reduced fee at time of service.  [facility]  [place] [phone] Patients are seen by appointment only. Walk-ins are not accepted. [place] will see patients 31 years of age and  older. Monday - Tuesday (8am-5pm) Most Wednesdays (8:30-5pm) $30 per visit, cash only  [facility] Adult [facility] Access PRO[facility]  [address], [facility] [phone] Patients are seen by appointment only. Walk-ins are not accepted. [place] will see patients 62 years of age and older. One Wednesday Evening (Monthly: Volunteer Based).  $30 per visit, cash only  [place]  [phone] for adults; Children under age 22, call Graduate Pediatric Dentistry at [phone]. Children aged 80-14, please call [phone] to request a pediatric application.  Dental services are provided in all areas of dental care including fillings, crowns and bridges, complete and partial dentures, implants, gum treatment, root canals, and extractions. Preventive care is also provided. Treatment is provided to both adults and children. Patients are selected via a lottery and there is often a waiting list.   [facility] [address], [place]  [phone] [email or web address]   [facility] [address] [place], [place] [phone], Ext. [phone] Second and Fourth Thursday of each month, opens at 6:30 AM; Clinic ends at 9 AM.  Patients are seen on a first-come first-served basis, and a limited number are seen during each clinic.   [facility]  [address] [place], [place] [phone]   Eligibility Requirements You must have lived in [place], [place], or [place] counties for at least the last three months.   You cannot be eligible for state or federal sponsored [employer], including [employer], [place], or [employer].   You generally cannot be eligible for healthcare insurance through your employer.    How to apply: Eligibility screenings are held every Tuesday and Wednesday afternoon from 1:00 pm until 4:00 pm. You do not need an appointment for the interview!  [facility] [address]  [place], [place]   [place]  [place] Department  [place]  [phone]    Behavioral Health Resources in the Community: Intensive Outpatient Programs Organization         Address  Phone  Notes  [place]. [address] [place], [place] [phone]   [facility] [address] [place], [place]   ADS: Alcohol & Drug S[facility] [address] [place], [place]   [place] [address]  [place], [place] or [phone]   Substance Abuse Resources Organization         Address  Phone  Notes  Alcohol and Drug Services  [phone]   [facility]  [phone]   [facility] [place]   Chinita Pester  [phone]   Residential & Outpatient Substance Abuse Program  [phone]   Psychological Services Organization         Address  Phone  Notes  [facility] [place]  [place]  [phone]   [place] [address] [place] or [phone]    Mobile Crisis Teams Organization         Address  Phone  Notes  [facility], Mobile Crisis Care Unit  [phone]   [facility]ive Psychotherapeutic Services  [address] [place], [place]   [place] [address], [place] [phone]    Self-Help/Support Groups Organization         Address  Phone             Notes  [place]. of [place] - variety of support groups  [place] Call for more information  Narcotics Anonymous (NA), [facility] [address], [employer] [place]  2 meetings at this location   Special educational needs teacher         Address  Phone  Notes  [facility]idential Treatment [place],    [place]  [phone]   [facility]  [address], [place] [phone], [place], [place]   [place], [place] [phone] Admissions: 8am-3pm M-F  Incentives Substance [place] [address],    [place], [place] [phone]   [facility] [address] [place], [place], [place]   [facility] [address]  [place], [place]   [facility] - Intensive Outpatient [address], [place], [place]   [facility] ([place].,  [place], [place] [phone] or [phone]   [facility] Services (RTS) [address], [place], [place] Accepts Medicaid  [facility] [place] [address],  [place] [phone] Substance Abuse/Addiction Treatment   [facility] Organization         Address  Phone  Notes  [facility]  [phone]   Domenic Schwab, PhD [address] [place] [place], [place]   [phone] or [phone]   [place], [place] [phone]   [facility] [address], [place], [place] [phone] Insurance/Medicaid/sponsorship through [facility] and Families [address] [place]                                    [place], [place] [phone] [place] [address], [place] [phone]    Dr. Adele Schilder  [phone]   [facility] Dept. 1) [address] [place] 2) [place] 3)  [address] [address], [place] [phone]  [phone]   [place] [phone] or [phone] (After Hours)

## 2013-10-07 NOTE — ED Notes (Signed)
Pt reports being lightheaded intermittently that became more frequent today with nausea. Pt says "I have a hx of UTI's and it feels like I have one now". Pt reports low pelvic pain and some incontinence with cough of sneeze. Denies burning or increase freq. Positive for nausea and diarrhea. She reports she thinks the diarrhea is due to chron's disease flar up. Pt in NAD.

## 2013-10-07 NOTE — ED Provider Notes (Signed)
CSN: [id number]     Arrival date & time 10/07/13  1655 History   First MD Initiated Contact with Patient 10/07/13 1851     No chief complaint on file.   HPI  Katrina Grant is a 43 y.o. female with a PMH of Chron's disease, fibromyalgia, ADD, arthritis, depression, headaches who presents to the ED for evaluation of multiple complaints. History was provided by the patient. Patient states she has had a "Chron's flare up" for the past 3-4 weeks. She states her symptoms are similar to her Chron's exacerbations in the past. She states she has had increased frequency of bowel movements and loose stools. No hematochezia. She intermittently has had sharp lower diffuse abdominal pain, but denies this currently. She states "I always have abdominal pain" which is unchanged from her recent abdominal pain. She also has had intermittent nausea with one episode of emesis last week. She has had good appetite and activity. Her GI specialist is Dr [facility]yn [facility] and she has a follow-up appointment on 10/28/13. She is currently on Humira with no steroids. She also states she has been having episodes of lightheadedness but denies this currently. States she gets a hot flash, feels lightheaded and nauseated. She states her episodes are worse with standing up. States she has had this before when she gets dehydrated from constant diarrhea. She denies any chest pain, SOB, or palpitations. She also states she has been having "UTI symptoms" which include increased urinary frequency and stress incontinence. She states this is similar to her UTI's in the past. No dysuria, vaginal bleeding/discharge, vaginal pain, or genital sores. LNMP two weeks ago. Patient sexually active with no recent partners. No concerns for STD. No fevers, chills. Patient has intermittent pelvic pressure but denies this currently. Patient has appointment with no PCP on 10/22/13. She has chronic unchanged lower back pain with no acute changes which she states  is due to her fibromyalgia. She also has had myalgias and arthralgias which is normal for her.    Past Medical History  Diagnosis Date  . Arthritis   . Depression   . Shortness of breath   . Headache(784.0)   . Crohn's disease of large intestine with fistula     since age 7  . Attention deficit disorder   . Attention deficit disorder     dx at age 72  . Cholesterol serum elevated   . Attention deficit disorder    Past Surgical History  Procedure Laterality Date  . Anal fistula repair       X 4   . Fracture surgery    . Tubal ligation    . Colonoscopy with dilation      X 2    No family history on file. History  Substance Use Topics  . Smoking status: Never Smoker   . Smokeless tobacco: Never Used  . Alcohol Use: No   OB History   Grav Para Term Preterm Abortions TAB SAB Ect Mult Living   2 2 2       2      Review of Systems  Constitutional: Positive for diaphoresis (hot flashes). Negative for fever, chills, activity change, appetite change and fatigue.  HENT: Negative for congestion, ear pain and sore throat.   Respiratory: Negative for cough and shortness of breath.   Cardiovascular: Negative for chest pain and leg swelling.  Gastrointestinal: Positive for nausea, vomiting (see HPI), abdominal pain (see HPI) and diarrhea. Negative for constipation, blood in stool and  rectal pain.  Genitourinary: Positive for frequency and pelvic pain. Negative for dysuria, hematuria, decreased urine volume, vaginal bleeding, vaginal discharge, difficulty urinating, genital sores, vaginal pain and menstrual problem.  Musculoskeletal: Positive for arthralgias, back pain (chronic) and myalgias.  Neurological: Positive for light-headedness and headaches. Negative for dizziness, syncope, weakness and numbness.    Allergies  Percocet  Home Medications   Prior to Admission medications   Medication Sig Start Date End Date Taking? Authorizing Provider  acetaminophen (TYLENOL) 500 MG  tablet Take 1,000 mg by mouth every 6 (six) hours as needed. For migraines and stomach upset    Yes Historical Provider, MD  adalimumab (HUMIRA PEN STARTER) 40 MG/0.8ML injection Inject 40 mg into the skin every 7 (seven) days.    Yes Historical Provider, MD  ciprofloxacin (CIPRO) 500 MG tablet Take 500 mg by mouth 2 (two) times daily. For Crohn's Disease.   Yes Historical Provider, MD  dicyclomine (BENTYL) 10 MG capsule Take 10 mg by mouth 2 (two) times daily.   Yes Historical Provider, MD  folic acid (FOLVITE) 1 MG tablet Take 1 mg by mouth daily.     Yes Historical Provider, MD  gabapentin (NEURONTIN) 100 MG capsule Take 200 mg by mouth 2 (two) times daily.   Yes Historical Provider, MD  methotrexate (RHEUMATREX) 2.5 MG tablet Take 10 mg by mouth once a week. Every Sunday. Caution:Chemotherapy. Protect from light.   Yes Historical Provider, MD  traMADol (ULTRAM) 50 MG tablet Take 50 mg by mouth at bedtime as needed (fibromyalgia pain.).   Yes Historical Provider, MD   BP 119/82  Pulse 95  Temp(Src) 98.5 F (36.9 C) (Oral)  Resp 16  Wt 216 lb (97.977 kg)  SpO2 99%  LMP 09/23/2013  Filed Vitals:   10/07/13 2004 10/07/13 2005 10/07/13 2007 10/07/13 2236  BP: 123/65 123/73 123/79 122/74  Pulse: 80 68 78 73  Temp:      TempSrc:      Resp: 16   20  Weight:      SpO2: 100%   98%    Physical Exam  Nursing note and vitals reviewed. Constitutional: She is oriented to person, place, and time. She appears well-developed and well-nourished. No distress.  Non-toxic  HENT:  Head: Normocephalic and atraumatic.  Right Ear: External ear normal.  Left Ear: External ear normal.  Nose: Nose normal.  Mouth/Throat: Oropharynx is clear and moist. No oropharyngeal exudate.  Eyes: Conjunctivae are normal. Right eye exhibits no discharge. Left eye exhibits no discharge.  Neck: Normal range of motion. Neck supple.  Cardiovascular: Normal rate, regular rhythm, normal heart sounds and intact distal  pulses.  Exam reveals no gallop and no friction rub.   No murmur heard. Dorsalis pedis pulses present and equal bilaterally  Pulmonary/Chest: Effort normal and breath sounds normal. No respiratory distress. She has no wheezes. She has no rales. She exhibits no tenderness.  Abdominal: Soft. She exhibits no distension and no mass. There is tenderness. There is no rebound and no guarding.  Middle lower pelvic tenderness. No peritoneal signs.  Musculoskeletal: Normal range of motion. She exhibits tenderness. She exhibits no edema.  Tenderness to palpation in the lower lumbar spine and paraspinal muscles diffusely. Patient moving all extremities throughout exam. Patient able to ambulate without difficulty or ataxia. Patient states she has lightheadedness when she stands but not while laying flat.  Neurological: She is alert and oriented to person, place, and time.  Skin: Skin is warm. She is not diaphoretic.  ED Course  Procedures (including critical care time) Labs Review Labs Reviewed  URINALYSIS, ROUTINE W REFLEX MICROSCOPIC - Abnormal; Notable for the following:    Specific Gravity, Urine 1.031 (*)    Hgb urine dipstick MODERATE (*)    All other components within normal limits  URINE MICROSCOPIC-ADD ON - Abnormal; Notable for the following:    Bacteria, UA FEW (*)    Crystals CA OXALATE CRYSTALS (*)    All other components within normal limits  POC URINE PREG, ED  POC URINE PREG, ED    Imaging Review No results found.   EKG Interpretation None      Results for orders placed during the hospital encounter of 10/07/13  URINALYSIS, ROUTINE W REFLEX MICROSCOPIC      Result Value Ref Range   Color, Urine YELLOW  YELLOW   APPearance CLEAR  CLEAR   Specific Gravity, Urine 1.031 (*) 1.005 - 1.030   pH 5.0  5.0 - 8.0   Glucose, UA NEGATIVE  NEGATIVE mg/dL   Hgb urine dipstick MODERATE (*) NEGATIVE   Bilirubin Urine NEGATIVE  NEGATIVE   Ketones, ur NEGATIVE  NEGATIVE mg/dL    Protein, ur NEGATIVE  NEGATIVE mg/dL   Urobilinogen, UA 0.2  0.0 - 1.0 mg/dL   Nitrite NEGATIVE  NEGATIVE   Leukocytes, UA NEGATIVE  NEGATIVE  URINE MICROSCOPIC-ADD ON      Result Value Ref Range   Squamous Epithelial / LPF RARE  RARE   WBC, UA 0-2  <3 WBC/hpf   RBC / HPF 0-2  <3 RBC/hpf   Bacteria, UA FEW (*) RARE   Crystals CA OXALATE CRYSTALS (*) NEGATIVE   Urine-Other MUCOUS PRESENT    CBC WITH DIFFERENTIAL      Result Value Ref Range   WBC 9.1  4.0 - 10.5 K/uL   RBC 4.35  3.87 - 5.11 MIL/uL   Hemoglobin 12.8  12.0 - 15.0 g/dL   HCT 39.2  36.0 - 46.0 %   MCV 90.1  78.0 - 100.0 fL   MCH 29.4  26.0 - 34.0 pg   MCHC 32.7  30.0 - 36.0 g/dL   RDW 13.9  11.5 - 15.5 %   Platelets 226  150 - 400 K/uL   Neutrophils Relative % 60  43 - 77 %   Neutro Abs 5.5  1.7 - 7.7 K/uL   Lymphocytes Relative 30  12 - 46 %   Lymphs Abs 2.7  0.7 - 4.0 K/uL   Monocytes Relative 8  3 - 12 %   Monocytes Absolute 0.7  0.1 - 1.0 K/uL   Eosinophils Relative 2  0 - 5 %   Eosinophils Absolute 0.2  0.0 - 0.7 K/uL   Basophils Relative 0  0 - 1 %   Basophils Absolute 0.0  0.0 - 0.1 K/uL  COMPREHENSIVE METABOLIC PANEL      Result Value Ref Range   Sodium 137  137 - 147 mEq/L   Potassium 4.1  3.7 - 5.3 mEq/L   Chloride 101  96 - 112 mEq/L   CO2 24  19 - 32 mEq/L   Glucose, Bld 84  70 - 99 mg/dL   BUN 16  6 - 23 mg/dL   Creatinine, Ser 0.67  0.50 - 1.10 mg/dL   Calcium 9.5  8.4 - 10.5 mg/dL   Total Protein 7.4  6.0 - 8.3 g/dL   Albumin 3.6  3.5 - 5.2 g/dL   AST 20  0 - 37  U/L   ALT 12  0 - 35 U/L   Alkaline Phosphatase 69  39 - 117 U/L   Total Bilirubin 0.2 (*) 0.3 - 1.2 mg/dL   GFR calc non Af Amer >90  >90 mL/min   GFR calc Af Amer >90  >90 mL/min  LIPASE, BLOOD      Result Value Ref Range   Lipase 43  11 - 59 U/L  POC URINE PREG, ED      Result Value Ref Range   Preg Test, Ur NEGATIVE  NEGATIVE     MDM   CHERIDAN KIBLER is a 43 y.o. female with a PMH of Chron's disease, fibromyalgia,  ADD, arthritis, depression, headaches who presents to the ED for evaluation of multiple complaints.   Rechecks  10:00 PM = Pelvic exam performed at bedside. Yellow-pink tinged clear fluid present in the vaginal vault. No CMT or adnexal tenderness. No vaginal bleeding. Lightheadedness improved.  10:18 PM = Patient declines pain medication. Repeat abdominal exam benign.  10:30 PM = Patient states she feels ok. Ready for discharge. No concerns. Feels she can follow-up with GI doctor and PCP at her scheduled appointments. Will fluid challenge and repeat vitals.  10:45 PM = Able to tolerate fluids. No lightheadedness or concerns.     Patient evaluated for multiple complaints. Had abdominal pain for the past 3-4 weeks which she states is her usual Crohn's flare up. Abdominal and pelvic exam benign. Labs unremarkable. No leukocytosis. Patient afebrile and non-toxic in appearance. Lightheadedness possibly due to dehydration from GI loss with diarrhea. Patient had improvements with this after IV and PO fluids in the ED. EKG negative for any acute ischemic changes. No chest pain. Patient also found to have hematuria with no clear etiology. Instructed patient to follow-up regarding this. Urine sent for culture. Patient symptomatic, however, UA not suggestive of a UTI. Will not treat at this time. Patient has follow-up appointment with GI and PCP. Instructed to drink fluids and rest. Return precautions, discharge instructions, and follow-up was discussed with the patient before discharge.      Discharge Medication List as of 10/07/2013 10:55 PM      Final impressions: 1. Abdominal pain   2. Crohn's disease   3. Lightheadedness   4. Hematuria      Mercy Moore PA-C   This patient was discussed with Dr. Ovidio Hanger, PA-C 10/08/13 1205

## 2013-10-08 LAB — URINE CULTURE
CULTURE: NO GROWTH
Colony Count: NO GROWTH

## 2013-10-08 LAB — GC/CHLAMYDIA PROBE AMP
CT Probe RNA: NEGATIVE
GC PROBE AMP APTIMA: NEGATIVE

## 2013-10-08 NOTE — ED Provider Notes (Signed)
Medical screening examination/treatment/procedure(s) were performed by non-physician practitioner and as supervising physician I was immediately available for consultation/collaboration.   EKG Interpretation None        Saddie Benders. Marek Nghiem, MD 10/08/13 3406

## 2013-10-22 DIAGNOSIS — R197 Diarrhea, unspecified: Secondary | ICD-10-CM | POA: Diagnosis not present

## 2013-10-22 DIAGNOSIS — K5140 Inflammatory polyps of colon without complications: Secondary | ICD-10-CM | POA: Diagnosis not present

## 2013-10-22 DIAGNOSIS — R319 Hematuria, unspecified: Secondary | ICD-10-CM | POA: Diagnosis not present

## 2013-10-22 DIAGNOSIS — K5080 Crohn's disease of both small and large intestine without complications: Secondary | ICD-10-CM | POA: Diagnosis not present

## 2013-10-22 DIAGNOSIS — IMO0001 Reserved for inherently not codable concepts without codable children: Secondary | ICD-10-CM | POA: Diagnosis not present

## 2013-10-22 DIAGNOSIS — R059 Cough, unspecified: Secondary | ICD-10-CM | POA: Diagnosis not present

## 2013-10-22 DIAGNOSIS — K5090 Crohn's disease, unspecified, without complications: Secondary | ICD-10-CM | POA: Diagnosis not present

## 2013-10-22 DIAGNOSIS — Z87440 Personal history of urinary (tract) infections: Secondary | ICD-10-CM | POA: Diagnosis not present

## 2013-10-22 DIAGNOSIS — J988 Other specified respiratory disorders: Secondary | ICD-10-CM | POA: Diagnosis not present

## 2013-10-22 DIAGNOSIS — R509 Fever, unspecified: Secondary | ICD-10-CM | POA: Diagnosis not present

## 2013-10-22 DIAGNOSIS — K219 Gastro-esophageal reflux disease without esophagitis: Secondary | ICD-10-CM | POA: Diagnosis not present

## 2013-10-22 DIAGNOSIS — D128 Benign neoplasm of rectum: Secondary | ICD-10-CM | POA: Diagnosis not present

## 2013-10-22 DIAGNOSIS — R109 Unspecified abdominal pain: Secondary | ICD-10-CM | POA: Diagnosis not present

## 2013-10-22 DIAGNOSIS — J189 Pneumonia, unspecified organism: Secondary | ICD-10-CM | POA: Diagnosis not present

## 2013-10-22 DIAGNOSIS — R05 Cough: Secondary | ICD-10-CM | POA: Diagnosis not present

## 2013-10-28 ENCOUNTER — Ambulatory Visit: Payer: Medicare Other | Admitting: Family Medicine

## 2013-11-07 ENCOUNTER — Ambulatory Visit: Payer: Medicare Other | Admitting: Family Medicine

## 2013-11-20 ENCOUNTER — Encounter: Payer: Self-pay | Admitting: Family Medicine

## 2013-11-20 ENCOUNTER — Ambulatory Visit (INDEPENDENT_AMBULATORY_CARE_PROVIDER_SITE_OTHER): Payer: Medicare Other | Admitting: Family Medicine

## 2013-11-20 VITALS — BP 110/82 | HR 73 | Temp 98.2°F | Ht 64.75 in | Wt 222.0 lb

## 2013-11-20 DIAGNOSIS — H9319 Tinnitus, unspecified ear: Secondary | ICD-10-CM

## 2013-11-20 DIAGNOSIS — Z7689 Persons encountering health services in other specified circumstances: Secondary | ICD-10-CM

## 2013-11-20 DIAGNOSIS — K5090 Crohn's disease, unspecified, without complications: Secondary | ICD-10-CM | POA: Diagnosis not present

## 2013-11-20 DIAGNOSIS — H93A9 Pulsatile tinnitus, unspecified ear: Secondary | ICD-10-CM

## 2013-11-20 DIAGNOSIS — H9190 Unspecified hearing loss, unspecified ear: Secondary | ICD-10-CM

## 2013-11-20 DIAGNOSIS — M797 Fibromyalgia: Secondary | ICD-10-CM

## 2013-11-20 DIAGNOSIS — IMO0001 Reserved for inherently not codable concepts without codable children: Secondary | ICD-10-CM | POA: Diagnosis not present

## 2013-11-20 DIAGNOSIS — K50919 Crohn's disease, unspecified, with unspecified complications: Secondary | ICD-10-CM | POA: Insufficient documentation

## 2013-11-20 DIAGNOSIS — Z7189 Other specified counseling: Secondary | ICD-10-CM

## 2013-11-20 HISTORY — DX: Fibromyalgia: M79.7

## 2013-11-20 NOTE — Progress Notes (Signed)
Pre visit review using our clinic review tool, if applicable. No additional management support is needed unless otherwise documented below in the visit note. 

## 2013-11-20 NOTE — Patient Instructions (Signed)
-  We placed a referral for you as discussed to the Ear doctor for you ear issues. It usually takes about 1-2 weeks to process and schedule this referral. If you have not heard from [place] regarding this appointment in 2 weeks please contact our office.  In the meantime please try claritin or allegra daily and nasocort  -PLEASE SIGN UP FOR MYCHART TODAY   We recommend the following healthy lifestyle measures: - eat a healthy diet consisting of lots of vegetables, fruits, beans, nuts, seeds, healthy meats such as white chicken and fish and whole grains.  - avoid fried foods, fast food, processed foods, sodas, red meet and other fattening foods.  - get a least 150 minutes of aerobic exercise per week.   Follow up in: 6 months and as needed

## 2013-11-20 NOTE — Progress Notes (Signed)
No chief complaint on file.   HPI:  Katrina Grant is here to establish care. Used to see Dr. Willette Pa but she left practice.   Has the following chronic problems and concerns today:  Patient Active Problem List   Diagnosis Date Noted  . Crohn's disease - managed by Dr. Coralyn Mark at [place], on Humira 11/20/2013  . Fibromyalgia - managed by Dr. Dossie Der, [facility] - takes Neurontin and Tramadol 11/20/2013  . DUB (dysfunctional uterine bleeding) 03/12/2011   Crohn's Disease: -managed by Dr. Coralyn Mark at [place]  -in hospital in May for viral illness and possible pneumonia -usually on Humira, but off temporarily -she reports she is feeling great now - denies fevers, cough, malaise  Whooshing sound: -for many months -has chronic allergy and sinus drainage, told ok by her PCP -? Sometime pulsatile, on and off daily -? Mild hearing loss in noisy room with this ear  Fibromyalgia: -managed by [facility] -on Neurontin and Tramadol  Health Maintenance: -last physical with pap and labs was in 09/2013 - normal  ROS: See pertinent positives and negatives per HPI.  Past Medical History  Diagnosis Date  . Arthritis     OA of knee and lumbar spine per pt report  . Depression   . Attention deficit disorder     dx at age 80  . Cholesterol serum elevated     on medication in the past  . Attention deficit disorder   . Fainting episodes   . Frequent headaches   . Heartburn   . Pneumonia     spring, 2015  . Crohn's disease - managed by Dr. Coralyn Mark at [place], on Humira 11/20/2013    with fistula, since age 67  . Fibromyalgia - managed by Dr. Dossie Der, [facility] - takes Neurontin and Tramadol 11/20/2013    Family History  Problem Relation Age of Onset  . Hypertension Mother   . Diabetes Father   . Cancer Sister     cervical  . Mental illness Brother     drugs and alcohol abuse    History   Social History  . Marital Status: Single    Spouse Name: N/A    Number of  Children: N/A  . Years of Education: N/A   Social History Main Topics  . Smoking status: Never Smoker   . Smokeless tobacco: Never Used  . Alcohol Use: No  . Drug Use: No  . Sexual Activity: Yes   Other Topics Concern  . None   Social History Narrative   Work or School: on disability for disability related to [employer] and managed by Dr. Coralyn Mark; works part time      Home Situation: lives with boyfriend and son (54 yo) in 2015      Spiritual Beliefs: Christian      Lifestyle: regular exercise - walking 3x per week; diet is healthy             Current outpatient prescriptions:Adalimumab (HUMIRA) 40 MG/0.8ML PSKT, Inject 40 mg into the skin once a week., Disp: , Rfl: ;  ciprofloxacin (CIPRO) 500 MG tablet, Take 500 mg by mouth 2 (two) times daily. For Crohn's Disease., Disp: , Rfl: ;  dicyclomine (BENTYL) 10 MG capsule, Take 10 mg by mouth 2 (two) times daily., Disp: , Rfl: ;  folic acid (FOLVITE) 1 MG tablet, Take 1 mg by mouth daily.  , Disp: , Rfl:  gabapentin (NEURONTIN) 100 MG capsule, Take 200 mg by mouth 2 (two)  times daily., Disp: , Rfl: ;  methotrexate 2.5 MG tablet, Take 25 mg by mouth once a week., Disp: , Rfl: ;  traMADol (ULTRAM) 50 MG tablet, Take 50 mg by mouth at bedtime as needed (fibromyalgia pain.)., Disp: , Rfl: ;  acetaminophen (TYLENOL) 500 MG tablet, Take 1,000 mg by mouth every 6 (six) hours as needed. For migraines and stomach upset , Disp: , Rfl:   EXAM:  Filed Vitals:   11/20/13 0918  BP: 110/82  Pulse: 73  Temp: 98.2 F (36.8 C)    Body mass index is 37.21 kg/(m^2).  GENERAL: vitals reviewed and listed above, alert, oriented, appears well hydrated and in no acute distress  HEENT: atraumatic, conjunttiva clear, no obvious abnormalities on inspection of external nose and ears, normal appearance of ear canals and TMs with clear effusions bilat, clear nasal congestion, mild post oropharyngeal erythema with PND, no tonsillar edema or exudate, no sinus  TTP  NECK: no obvious masses on inspection  LUNGS: clear to auscultation bilaterally, no wheezes, rales or rhonchi, good air movement  CV: HRRR, no peripheral edema  MS: moves all extremities without noticeable abnormality  PSYCH: pleasant and cooperative, no obvious depression or anxiety  ASSESSMENT AND PLAN:  Discussed the following assessment and plan:  Pulsatile tinnitus/hearing loss - Plan: Ambulatory referral to ENT -we discussed possible serious and likely etiologies, workup and treatment, treatment risks and return precautions -after this discussion, Anaiyah opted for - nasocort and antihistamine for ear effusion and referral to ENT  -of course, we advised [place]  to return or notify a doctor immediately if symptoms worsen or persist or new concerns arise.  Crohn's disease - managed by Dr. Coralyn Mark at [facility], on Humira  Fibromyalgia - managed by Dr. Dossie Der, [facility] - takes Neurontin and Tramadol  Encounter to establish care with new doctor  -We reviewed the PMH, PSH, FH, SH, Meds and Allergies. -We provided refills for any medications we will prescribe as needed. -We addressed current concerns per orders and patient instructions. -We have asked for records for pertinent exams, studies, vaccines and notes from previous providers. -We have advised patient to follow up per instructions below. -HM updated -she will follow up with her GI doc about when to restart crohn's medications -follow up in 6 months or as needed  -Patient advised to return or notify a doctor immediately if symptoms worsen or persist or new concerns arise.  Patient Instructions  -We placed a referral for you as discussed to the Ear doctor for you ear issues. It usually takes about 1-2 weeks to process and schedule this referral. If you have not heard from [place] regarding this appointment in 2 weeks please contact our office.  In the meantime please try claritin or allegra daily and  nasocort  -PLEASE SIGN UP FOR MYCHART TODAY   We recommend the following healthy lifestyle measures: - eat a healthy diet consisting of lots of vegetables, fruits, beans, nuts, seeds, healthy meats such as white chicken and fish and whole grains.  - avoid fried foods, fast food, processed foods, sodas, red meet and other fattening foods.  - get a least 150 minutes of aerobic exercise per week.   Follow up in: 6 months and as needed      Lucretia Kern

## 2013-11-27 ENCOUNTER — Encounter: Payer: Self-pay | Admitting: *Deleted

## 2013-11-27 DIAGNOSIS — J301 Allergic rhinitis due to pollen: Secondary | ICD-10-CM | POA: Diagnosis not present

## 2013-11-27 DIAGNOSIS — H6980 Other specified disorders of Eustachian tube, unspecified ear: Secondary | ICD-10-CM | POA: Diagnosis not present

## 2013-11-27 DIAGNOSIS — H9319 Tinnitus, unspecified ear: Secondary | ICD-10-CM | POA: Diagnosis not present

## 2013-12-10 ENCOUNTER — Ambulatory Visit (INDEPENDENT_AMBULATORY_CARE_PROVIDER_SITE_OTHER): Payer: Medicare Other | Admitting: Family Medicine

## 2013-12-10 ENCOUNTER — Encounter: Payer: Self-pay | Admitting: Family Medicine

## 2013-12-10 VITALS — BP 100/70 | HR 58 | Temp 98.5°F | Ht 64.75 in | Wt 222.0 lb

## 2013-12-10 DIAGNOSIS — E669 Obesity, unspecified: Secondary | ICD-10-CM

## 2013-12-10 NOTE — Patient Instructions (Signed)
Treatment options for Obesity:  A comprehensive exercise and diet program are advised and are the initial and longterm/lifelong treatment for obesity.  Please consider the PREP program - information provided.  The safest and healthiest way to improve you physical and mental health is to: - eat a healthy diet consisting of lots of vegetables, fruits, beans, nuts, seeds, healthy meats such as white chicken and fish and whole grains.  - avoid fried foods, fast food, processed foods, sodas, red meet and other fattening foods.  - get a least 150-300 minutes of aerobic exercise per week.  -reduce stress - counseling, meditation, relaxation to balance other aspects of your life -we know this is a long and initially difficult process and that it will often take years to get to reach optimal health -as you get healthier you will build extra blood volume and lean tissue that is heavier then fatty tissue and you will hit plateaus in your weight - thus I ADVISE YOU DO NOT FOLLOW YOUR WEIGHT ON A SCALE  -instead, focus on practicing healthy habits daily  Medication for Obesity: IF BMI >30 and you have not achieved weight loss through diet and exercise alone, we suggest pharmacologic therapy can be added to diet and exercise.  First Choice:  1)Orlistat (Xenical): -likely the safest in terms of heart and vascular side effects and helps cholesterol - usually recommended for 2 - 4 years -common and or serious side effects include but are not limited to: anaphylaxis, liver toxicity, gas, oily stools, loose stools, fatty stools -150 mg up to 3 times daily only during meals containing fat, diet should be <30% fat -not recommended in: pregnancy, malabsorption, some kidney stones, anorexia or bulemia history  2)Lorcaserin (Belviq) -no long term safety data -to be discontinued if do not achieve 5% body weight reduction in 12 weeks -common and or serious side effects include but are not limited to: heart  disease, anemia, abuse and dependency, depression, suicide, nausea and vomiting, headaches, low blood sugar, dizziness, anemia, fatigue, diarrhea, urinary tract infections,  back pain, dry mouth, pain, cognitive impairment 46m twice daily -not recommended if pregnant, kidney disease, heart disease, diabetes, depression -I do not advise long term and advise weaning of if not effective and after 1-2 years  3)combination phentermine-topiramate (Qsymia)- for men or post menopausal women or monthly pregnancy test: -can cause fetal anomalies -to be discontinued GRADUALLY if do not achieve 5% body weight reduction in 12 weeks -adverse: -not recommended if: heart disease, breastfeeding, pregnant, hyperthyroid, glaucoma, drug abuse history, renal or liver problems, alcohol use, depression -common and or serious side effects include but are not limited to: metabolic acidosis, kidney stones, bone issues, electrolyte issues, heart attack, suicide, psychosis, dependency and abuse, severe reaction, seizure is stop suddenly, dry mouth, numbness, headache, blurred vision, diarrhea, fatigue, depression, anxiety, pain, poor focus, acid reflux, dry eyes, eye pain, heart arhythmia -I do not advise long term and advise weaning of if not effective and in 1-2 years

## 2013-12-10 NOTE — Progress Notes (Signed)
Pre visit review using our clinic review tool, if applicable. No additional management support is needed unless otherwise documented below in the visit note. 

## 2013-12-10 NOTE — Progress Notes (Signed)
No chief complaint on file.   HPI:  Acute visit for:  1) Obesity: -orlistat - first line -not exercising on a regular basis - does swim sometimes -swimming and the treadmill bother her ankles and knees  -diet is ok sometimes, but when stressed runs to chips, sweets, sugar, carbs and eats these a lot -she wonders if adderal would be a good treatment option -wants to know about medication options -reports she has already been referred to a nutritionist but is not sure if insurance will cover  ROS: See pertinent positives and negatives per HPI.  Past Medical History  Diagnosis Date  . Arthritis     OA of knee and lumbar spine per pt report  . Depression   . Attention deficit disorder     dx at age 43  . Cholesterol serum elevated     on medication in the past  . Attention deficit disorder   . Fainting episodes   . Frequent headaches   . Heartburn   . Pneumonia     spring, 2015  . Crohn's disease - managed by Dr. Coralyn Mark at [place], on Humira 11/20/2013    with fistula, since age 43  . Fibromyalgia - managed by Dr. Dossie Der, [facility] - takes Neurontin and Tramadol 11/20/2013    Past Surgical History  Procedure Laterality Date  . Anal fistula repair       X 4   . Fracture surgery    . Tubal ligation    . Colonoscopy with dilation      X 2     Family History  Problem Relation Age of Onset  . Hypertension Mother   . Diabetes Father   . Cancer Sister     cervical  . Mental illness Brother     drugs and alcohol abuse    History   Social History  . Marital Status: Single    Spouse Name: N/A    Number of Children: N/A  . Years of Education: N/A   Social History Main Topics  . Smoking status: Never Smoker   . Smokeless tobacco: Never Used  . Alcohol Use: No  . Drug Use: No  . Sexual Activity: Yes   Other Topics Concern  . None   Social History Narrative   Work or School: on disability for disability related to [employer] and managed by Dr. Coralyn Mark; works  part time      Home Situation: lives with boyfriend and son (63 yo) in 2015      Spiritual Beliefs: Christian      Lifestyle: regular exercise - walking 3x per week; diet is healthy             Current outpatient prescriptions:acetaminophen (TYLENOL) 500 MG tablet, Take 1,000 mg by mouth every 6 (six) hours as needed. For migraines and stomach upset , Disp: , Rfl: ;  Adalimumab (HUMIRA) 40 MG/0.8ML PSKT, Inject 40 mg into the skin once a week., Disp: , Rfl: ;  ciprofloxacin (CIPRO) 500 MG tablet, Take 500 mg by mouth 2 (two) times daily. For Crohn's Disease., Disp: , Rfl:  dicyclomine (BENTYL) 10 MG capsule, Take 10 mg by mouth 2 (two) times daily., Disp: , Rfl: ;  fluticasone (FLONASE) 50 MCG/ACT nasal spray, , Disp: , Rfl: ;  folic acid (FOLVITE) 1 MG tablet, Take 1 mg by mouth daily.  , Disp: , Rfl: ;  gabapentin (NEURONTIN) 100 MG capsule, Take 200 mg by mouth 2 (two) times daily., Disp: ,  Rfl: ;  loratadine (CLARITIN) 10 MG tablet, Take 10 mg by mouth daily., Disp: , Rfl:  methotrexate 2.5 MG tablet, Take 25 mg by mouth once a week., Disp: , Rfl: ;  traMADol (ULTRAM) 50 MG tablet, Take 50 mg by mouth at bedtime as needed (fibromyalgia pain.)., Disp: , Rfl:   EXAM:  Filed Vitals:   12/10/13 1254  BP: 100/70  Pulse: 58  Temp: 98.5 F (36.9 C)    Body mass index is 37.21 kg/(m^2).  GENERAL: vitals reviewed and listed above, alert, oriented, appears well hydrated and in no acute distress  HEENT: atraumatic, conjunttiva clear, no obvious abnormalities on inspection of external nose and ears  NECK: no obvious masses on inspection  LUNGS: clear to auscultation bilaterally, no wheezes, rales or rhonchi, good air movement  CV: HRRR, no peripheral edema  MS: moves all extremities without noticeable abnormality  PSYCH: pleasant and cooperative, no obvious depression or anxiety  ASSESSMENT AND PLAN:  Discussed the following assessment and plan:  Obesity (BMI 30-39.9)  > then  30 minutes spent with this patient with >50% in counseling on theTreatment options for Obesity:  Order given for th the PREP program and she will consider this.  A comprehensive exercise and diet program are advised and are the initial and longterm/lifelong treatment for obesity.  The safest and healthiest way to improve you physical and mental health is to: - eat a healthy diet consisting of lots of vegetables, fruits, beans, nuts, seeds, healthy meats such as white chicken and fish and whole grains.  - avoid fried foods, fast food, processed foods, sodas, red meet and other fattening foods.  - get a least 150-300 minutes of aerobic exercise per week.  -reduce stress - counseling, meditation, relaxation to balance other aspects of your life -we know this is a long and initially difficult process and that it will often take years to get to reach optimal health -as you get healthier you will build extra blood volume and lean tissue that is heavier then fatty tissue and you will hit plateaus in your weight - thus I ADVISE YOU DO NOT FOLLOW YOUR WEIGHT ON A SCALE  -instead, focus on practicing healthy habits daily  Medication for Obesity: IF BMI >30 and you have not achieved weight loss through diet and exercise alone, we suggest pharmacologic therapy can be added to diet and exercise.  First Choice:  1)Orlistat (Xenical): -likely the safest in terms of heart and vascular side effects and helps cholesterol - usually recommended for 2 - 4 years -common and or serious side effects include but are not limited to: anaphylaxis, liver toxicity, gas, oily stools, loose stools, fatty stools -150 mg up to 3 times daily only during meals containing fat, diet should be <30% fat -not recommended in: pregnancy, malabsorption, some kidney stones, anorexia or bulemia history  2)Lorcaserin (Belviq) -no long term safety data -to be discontinued if do not achieve 5% body weight reduction in 12 weeks -common  and or serious side effects include but are not limited to: heart disease, anemia, abuse and dependency, depression, suicide, nausea and vomiting, headaches, low blood sugar, dizziness, anemia, fatigue, diarrhea, urinary tract infections, back pain, dry mouth, pain, cognitive impairment 25m twice daily -not recommended if pregnant, kidney disease, heart disease, diabetes, depression -I do not advise long term and advise weaning of if not effective and after 1-2 years  3)combination phentermine-topiramate (Qsymia)- for men or post menopausal women or monthly pregnancy test: -can cause fetal  anomalies -to be discontinued GRADUALLY if do not achieve 5% body weight reduction in 12 weeks -adverse: -not recommended if: heart disease, breastfeeding, pregnant, hyperthyroid, glaucoma, drug abuse history, renal or liver problems, alcohol use, depression -common and or serious side effects include but are not limited to: metabolic acidosis, kidney stones, bone issues, electrolyte issues, heart attack, suicide, psychosis, dependency and abuse, severe reaction, seizure is stop suddenly, dry mouth, numbness, headache, blurred vision, diarrhea, fatigue, depression, anxiety, pain, poor focus, acid reflux, dry eyes, eye pain, heart arhythmia -I do not advise long term and advise weaning of if not effective and in 1-2 years  -Patient advised to return or notify a doctor immediately if symptoms worsen or persist or new concerns arise.  Patient Instructions  Treatment options for Obesity:  A comprehensive exercise and diet program are advised and are the initial and longterm/lifelong treatment for obesity.  Please consider the PREP program - information provided.  The safest and healthiest way to improve you physical and mental health is to: - eat a healthy diet consisting of lots of vegetables, fruits, beans, nuts, seeds, healthy meats such as white chicken and fish and whole grains.  - avoid fried foods, fast  food, processed foods, sodas, red meet and other fattening foods.  - get a least 150-300 minutes of aerobic exercise per week.  -reduce stress - counseling, meditation, relaxation to balance other aspects of your life -we know this is a long and initially difficult process and that it will often take years to get to reach optimal health -as you get healthier you will build extra blood volume and lean tissue that is heavier then fatty tissue and you will hit plateaus in your weight - thus I ADVISE YOU DO NOT FOLLOW YOUR WEIGHT ON A SCALE  -instead, focus on practicing healthy habits daily  Medication for Obesity: IF BMI >30 and you have not achieved weight loss through diet and exercise alone, we suggest pharmacologic therapy can be added to diet and exercise.  First Choice:  1)Orlistat (Xenical): -likely the safest in terms of heart and vascular side effects and helps cholesterol - usually recommended for 2 - 4 years -common and or serious side effects include but are not limited to: anaphylaxis, liver toxicity, gas, oily stools, loose stools, fatty stools -150 mg up to 3 times daily only during meals containing fat, diet should be <30% fat -not recommended in: pregnancy, malabsorption, some kidney stones, anorexia or bulemia history  2)Lorcaserin (Belviq) -no long term safety data -to be discontinued if do not achieve 5% body weight reduction in 12 weeks -common and or serious side effects include but are not limited to: heart disease, anemia, abuse and dependency, depression, suicide, nausea and vomiting, headaches, low blood sugar, dizziness, anemia, fatigue, diarrhea, urinary tract infections,  back pain, dry mouth, pain, cognitive impairment 48m twice daily -not recommended if pregnant, kidney disease, heart disease, diabetes, depression -I do not advise long term and advise weaning of if not effective and after 1-2 years  3)combination phentermine-topiramate (Qsymia)- for men or  post menopausal women or monthly pregnancy test: -can cause fetal anomalies -to be discontinued GRADUALLY if do not achieve 5% body weight reduction in 12 weeks -adverse: -not recommended if: heart disease, breastfeeding, pregnant, hyperthyroid, glaucoma, drug abuse history, renal or liver problems, alcohol use, depression -common and or serious side effects include but are not limited to: metabolic acidosis, kidney stones, bone issues, electrolyte issues, heart attack, suicide, psychosis, dependency and abuse, severe  reaction, seizure is stop suddenly, dry mouth, numbness, headache, blurred vision, diarrhea, fatigue, depression, anxiety, pain, poor focus, acid reflux, dry eyes, eye pain, heart arhythmia -I do not advise long term and advise weaning of if not effective and in 1-2 years     KIM, Jarrett Soho R.

## 2014-01-31 ENCOUNTER — Ambulatory Visit (INDEPENDENT_AMBULATORY_CARE_PROVIDER_SITE_OTHER): Payer: Medicare Other | Admitting: Physician Assistant

## 2014-01-31 ENCOUNTER — Encounter: Payer: Self-pay | Admitting: Physician Assistant

## 2014-01-31 VITALS — BP 90/64 | HR 60 | Temp 97.9°F | Resp 18 | Wt 226.0 lb

## 2014-01-31 DIAGNOSIS — R300 Dysuria: Secondary | ICD-10-CM

## 2014-01-31 LAB — POCT URINALYSIS DIPSTICK
Bilirubin, UA: NEGATIVE
Glucose, UA: NEGATIVE
KETONES UA: NEGATIVE
Leukocytes, UA: NEGATIVE
Nitrite, UA: NEGATIVE
PH UA: 7
PROTEIN UA: NEGATIVE
Spec Grav, UA: 1.025
UROBILINOGEN UA: 0.2

## 2014-01-31 LAB — URINALYSIS, ROUTINE W REFLEX MICROSCOPIC
Bilirubin Urine: NEGATIVE
KETONES UR: NEGATIVE
Leukocytes, UA: NEGATIVE
Nitrite: NEGATIVE
SPECIFIC GRAVITY, URINE: 1.02 (ref 1.000–1.030)
URINE GLUCOSE: NEGATIVE
Urobilinogen, UA: 0.2 (ref 0.0–1.0)
pH: 7 (ref 5.0–8.0)

## 2014-01-31 NOTE — Progress Notes (Signed)
Pre visit review using our clinic review tool, if applicable. No additional management support is needed unless otherwise documented below in the visit note. 

## 2014-01-31 NOTE — Patient Instructions (Addendum)
We are performing some extra tests in your urine to see what the cause of your symptoms are. Unfortunately at this time there is no clear infection to treat.  Continue to push fluid hydration, we will call you with the test results when available.  If emergency symptoms discussed during visit developed, seek medical attention immediately.  Followup as needed, or for worsening or persistent symptoms despite treatment.    Dysuria Dysuria is the medical term for pain with urination. There are many causes for dysuria, but urinary tract infection is the most common. If a urinalysis was performed it can show that there is a urinary tract infection. A urine culture confirms that you or your child is sick. You will need to follow up with a healthcare provider because:  If a urine culture was done you will need to know the culture results and treatment recommendations.  If the urine culture was positive, you or your child will need to be put on antibiotics or know if the antibiotics prescribed are the right antibiotics for your urinary tract infection.  If the urine culture is negative (no urinary tract infection), then other causes may need to be explored or antibiotics need to be stopped. Today laboratory work may have been done and there does not seem to be an infection. If cultures were done they will take at least 24 to 48 hours to be completed. Today x-rays may have been taken and they read as normal. No cause can be found for the problems. The x-rays may be re-read by a radiologist and you will be contacted if additional findings are made. You or your child may have been put on medications to help with this problem until you can see your primary caregiver. If the problems get better, see your primary caregiver if the problems return. If you were given antibiotics (medications which kill germs), take all of the mediations as directed for the full course of treatment.  If laboratory work was done,  you need to find the results. Leave a telephone number where you can be reached. If this is not possible, make sure you find out how you are to get test results. HOME CARE INSTRUCTIONS   Drink lots of fluids. For adults, drink eight, 8 ounce glasses of clear juice or water a day. For children, replace fluids as suggested by your caregiver.  Empty the bladder often. Avoid holding urine for long periods of time.  After a bowel movement, women should cleanse front to back, using each tissue only once.  Empty your bladder before and after sexual intercourse.  Take all the medicine given to you until it is gone. You may feel better in a few days, but TAKE ALL MEDICINE.  Avoid caffeine, tea, alcohol and carbonated beverages, because they tend to irritate the bladder.  In men, alcohol may irritate the prostate.  Only take over-the-counter or prescription medicines for pain, discomfort, or fever as directed by your caregiver.  If your caregiver has given you a follow-up appointment, it is very important to keep that appointment. Not keeping the appointment could result in a chronic or permanent injury, pain, and disability. If there is any problem keeping the appointment, you must call back to this facility for assistance. SEEK IMMEDIATE MEDICAL CARE IF:   Back pain develops.  A fever develops.  There is nausea (feeling sick to your stomach) or vomiting (throwing up).  Problems are no better with medications or are getting worse. MAKE SURE YOU:  Understand these instructions.  Will watch your condition.  Will get help right away if you are not doing well or get worse. Document Released: 03/04/2004 Document Revised: 08/29/2011 Document Reviewed: 01/10/2008 [facility] Patient Information 2015 [place], [place]. This information is not intended to replace advice given to you by your health care provider. Make sure you discuss any questions you have with your health care provider.

## 2014-01-31 NOTE — Progress Notes (Signed)
Subjective:    Patient ID: Katrina Grant, female    DOB: 1971/02/23, 43 y.o.   MRN: [id number]  Urinary Tract Infection  This is a new problem. The current episode started in the past 7 days. The problem occurs every urination. The problem has been gradually worsening. The quality of the pain is described as aching (pressure in her lower abdomen when she urinates.). The pain is at a severity of 6/10. There has been no fever. There is a history of pyelonephritis. Associated symptoms include chills, frequency, nausea, a possible pregnancy and urgency. Pertinent negatives include no discharge, flank pain, hematuria, hesitancy, sweats or vomiting. She has tried increased fluids (has been on Cipro 528m BID for the past year for her Crohns.) for the symptoms. The treatment provided no relief. Her past medical history is significant for recurrent UTIs, urinary stasis and a urological procedure. There is no history of catheterization, kidney stones or a single kidney.      Review of Systems  Constitutional: Positive for chills. Negative for fever.  Respiratory: Negative for shortness of breath.   Cardiovascular: Negative for chest pain.  Gastrointestinal: Positive for nausea. Negative for vomiting and diarrhea.  Genitourinary: Positive for urgency and frequency. Negative for hesitancy, hematuria and flank pain.  Neurological: Negative for syncope and headaches.  All other systems reviewed and are negative.  Past Medical History  Diagnosis Date  . Arthritis     OA of knee and lumbar spine per pt report  . Depression   . Attention deficit disorder     dx at age 43 . Cholesterol serum elevated     on medication in the past  . Attention deficit disorder   . Fainting episodes   . Frequent headaches   . Heartburn   . Pneumonia     spring, 2015  . Crohn's disease - managed by Dr. SCoralyn Markat [facility] on Humira 11/20/2013    with fistula, since age 43 . Fibromyalgia - managed by Dr. SDossie Der  [facility] - takes Neurontin and Tramadol 11/20/2013    History   Social History  . Marital Status: Single    Spouse Name: N/A    Number of Children: N/A  . Years of Education: N/A   Occupational History  . Not on file.   Social History Main Topics  . Smoking status: Never Smoker   . Smokeless tobacco: Never Used  . Alcohol Use: No  . Drug Use: No  . Sexual Activity: Yes   Other Topics Concern  . Not on file   Social History Narrative   Work or School: on disability for disability related to Crohn's and managed by Dr. SCoralyn Mark works part time      Home Situation: lives with boyfriend and son (173yo) in 2015      Spiritual Beliefs: Christian      Lifestyle: regular exercise - walking 3x per week; diet is healthy             Past Surgical History  Procedure Laterality Date  . Anal fistula repair       X 4   . Fracture surgery    . Tubal ligation    . Colonoscopy with dilation      X 2     Family History  Problem Relation Age of Onset  . Hypertension Mother   . Diabetes Father   . Cancer Sister     cervical  . Mental illness Brother  drugs and alcohol abuse    Allergies  Allergen Reactions  . Percocet [Oxycodone-Acetaminophen] Nausea And Vomiting    Current Outpatient Prescriptions on File Prior to Visit  Medication Sig Dispense Refill  . acetaminophen (TYLENOL) 500 MG tablet Take 1,000 mg by mouth every 6 (six) hours as needed. For migraines and stomach upset       . Adalimumab (HUMIRA) 40 MG/0.8ML PSKT Inject 40 mg into the skin once a week.      . ciprofloxacin (CIPRO) 500 MG tablet Take 500 mg by mouth 2 (two) times daily. For Crohn's Disease.      . dicyclomine (BENTYL) 10 MG capsule Take 10 mg by mouth 2 (two) times daily.      . fluticasone (FLONASE) 50 MCG/ACT nasal spray       . folic acid (FOLVITE) 1 MG tablet Take 1 mg by mouth daily.        Marland Kitchen gabapentin (NEURONTIN) 100 MG capsule Take 200 mg by mouth 2 (two) times daily.      Marland Kitchen  loratadine (CLARITIN) 10 MG tablet Take 10 mg by mouth daily.      . methotrexate 2.5 MG tablet Take 25 mg by mouth once a week.      . traMADol (ULTRAM) 50 MG tablet Take 50 mg by mouth at bedtime as needed (fibromyalgia pain.).       No current facility-administered medications on file prior to visit.    EXAM: BP 90/64  Pulse 60  Temp(Src) 97.9 F (36.6 C) (Oral)  Resp 18  Wt 226 lb (102.513 kg)  LMP 01/15/2014      Objective:   Physical Exam  Nursing note and vitals reviewed. Constitutional: She is oriented to person, place, and time. She appears well-developed and well-nourished. No distress.  HENT:  Head: Normocephalic and atraumatic.  Eyes: Conjunctivae and EOM are normal. Pupils are equal, round, and reactive to light.  Cardiovascular: Normal rate, regular rhythm and intact distal pulses.   Pulmonary/Chest: Effort normal and breath sounds normal. No respiratory distress. She has no wheezes. She has no rales. She exhibits no tenderness.  No cva ttp.  Abdominal: Soft. Bowel sounds are normal. She exhibits no distension and no mass. There is no tenderness. There is no rebound and no guarding.  Neurological: She is alert and oriented to person, place, and time.  Skin: Skin is warm and dry. No rash noted. She is not diaphoretic. No erythema. No pallor.  Psychiatric: She has a normal mood and affect. Her behavior is normal. Judgment and thought content normal.     Lab Results  Component Value Date   WBC 9.1 10/07/2013   HGB 12.8 10/07/2013   HCT 39.2 10/07/2013   PLT 226 10/07/2013   GLUCOSE 84 10/07/2013   ALT 12 10/07/2013   AST 20 10/07/2013   NA 137 10/07/2013   K 4.1 10/07/2013   CL 101 10/07/2013   CREATININE 0.67 10/07/2013   BUN 16 10/07/2013   CO2 24 10/07/2013        Assessment & Plan:  Skyllar was seen today for urinary tract infection.  Diagnoses and associated orders for this visit:  Dysuria Comments: UA with blood, patient currently taking Cipro twice a day  for Crohn's, will obtain micro and culture. Push fluids. - POCT urinalysis dipstick - Culture, Urine - Urinalysis with Reflex Microscopic    Return precautions provided, and patient handout on dysuria.  Plan to follow up as needed, or for worsening or persistent  symptoms despite treatment.  Patient Instructions  We are performing some extra tests in your urine to see what the cause of your symptoms are. Unfortunately at this time there is no clear infection to treat.  Continue to push fluid hydration, we will call you with the test results when available.  If emergency symptoms discussed during visit developed, seek medical attention immediately.  Followup as needed, or for worsening or persistent symptoms despite treatment.

## 2014-02-02 LAB — URINE CULTURE
Colony Count: NO GROWTH
Organism ID, Bacteria: NO GROWTH

## 2014-02-03 ENCOUNTER — Other Ambulatory Visit: Payer: Self-pay | Admitting: Physician Assistant

## 2014-02-03 DIAGNOSIS — R300 Dysuria: Secondary | ICD-10-CM

## 2014-02-13 ENCOUNTER — Ambulatory Visit (INDEPENDENT_AMBULATORY_CARE_PROVIDER_SITE_OTHER): Payer: Medicare Other | Admitting: Family Medicine

## 2014-02-13 ENCOUNTER — Encounter: Payer: Self-pay | Admitting: Family Medicine

## 2014-02-13 ENCOUNTER — Ambulatory Visit (INDEPENDENT_AMBULATORY_CARE_PROVIDER_SITE_OTHER)
Admission: RE | Admit: 2014-02-13 | Discharge: 2014-02-13 | Disposition: A | Discharge status: Home / Self Care | Payer: Medicare Other | Source: Ambulatory Visit | Attending: Family Medicine | Admitting: Family Medicine

## 2014-02-13 VITALS — BP 112/78 | HR 79 | Temp 98.0°F | Ht 64.75 in | Wt 223.0 lb

## 2014-02-13 DIAGNOSIS — K50919 Crohn's disease, unspecified, with unspecified complications: Secondary | ICD-10-CM

## 2014-02-13 DIAGNOSIS — R002 Palpitations: Secondary | ICD-10-CM

## 2014-02-13 DIAGNOSIS — J069 Acute upper respiratory infection, unspecified: Secondary | ICD-10-CM | POA: Diagnosis not present

## 2014-02-13 DIAGNOSIS — M797 Fibromyalgia: Secondary | ICD-10-CM

## 2014-02-13 DIAGNOSIS — K5090 Crohn's disease, unspecified, without complications: Secondary | ICD-10-CM

## 2014-02-13 DIAGNOSIS — IMO0001 Reserved for inherently not codable concepts without codable children: Secondary | ICD-10-CM

## 2014-02-13 MED ORDER — DOXYCYCLINE HYCLATE 100 MG PO TABS: 100.0000 mg | ORAL_TABLET | Freq: Two times a day (BID) | ORAL | Status: DC

## 2014-02-13 NOTE — Progress Notes (Signed)
No chief complaint on file.   HPI:  Cough: -started: for about 1 month has had PND, cough (thought allergies so tried flonase and claritin) - but worsening the last few days with SOB mild at time, felt wheezy 1 week ago with exercise, had palpitations yesterday and anxious when SOB -symptoms:nasal congestion, pnd, productive cough, mild SOB, ears full (seeing ENT), heart palpitations, nausea, chills a few times -denies:fever, NVD, tooth pain, CP -has tried: allergy medications -sick contacts/travel/risks: denies flu exposure, tick exposure or or Ebola risks -Hx of: allergies -she is worried about pneumonia as she reports this is how she felt when she had this -on humira  ROS: See pertinent positives and negatives per HPI.  Past Medical History  Diagnosis Date  . Arthritis     OA of knee and lumbar spine per pt report  . Depression   . Attention deficit disorder     dx at age 43  . Cholesterol serum elevated     on medication in the past  . Attention deficit disorder   . Fainting episodes   . Frequent headaches   . Heartburn   . Pneumonia     spring, 2015  . Crohn's disease - managed by Dr. Coralyn Mark at [facility], on Humira 11/20/2013    with fistula, since age 89  . Fibromyalgia - managed by Dr. Dossie Der, [facility] - takes Neurontin and Tramadol 11/20/2013    Past Surgical History  Procedure Laterality Date  . Anal fistula repair       X 4   . Fracture surgery    . Tubal ligation    . Colonoscopy with dilation      X 2     Family History  Problem Relation Age of Onset  . Hypertension Mother   . Diabetes Father   . Cancer Sister     cervical  . Mental illness Brother     drugs and alcohol abuse    History   Social History  . Marital Status: Single    Spouse Name: N/A    Number of Children: N/A  . Years of Education: N/A   Social History Main Topics  . Smoking status: Never Smoker   . Smokeless tobacco: Never Used  . Alcohol Use: No  . Drug Use: No  .  Sexual Activity: Yes   Other Topics Concern  . None   Social History Narrative   Work or School: on disability for disability related to [employer] and managed by Dr. Coralyn Mark; works part time      Home Situation: lives with boyfriend and son (89 yo) in 2015      Spiritual Beliefs: Christian      Lifestyle: regular exercise - walking 3x per week; diet is healthy             Current outpatient prescriptions:acetaminophen (TYLENOL) 500 MG tablet, Take 1,000 mg by mouth every 6 (six) hours as needed. For migraines and stomach upset , Disp: , Rfl: ;  Adalimumab (HUMIRA) 40 MG/0.8ML PSKT, Inject 40 mg into the skin once a week., Disp: , Rfl: ;  ciprofloxacin (CIPRO) 500 MG tablet, Take 500 mg by mouth 2 (two) times daily. For Crohn's Disease., Disp: , Rfl:  dicyclomine (BENTYL) 10 MG capsule, Take 10 mg by mouth 2 (two) times daily., Disp: , Rfl: ;  fluticasone (FLONASE) 50 MCG/ACT nasal spray, , Disp: , Rfl: ;  folic acid (FOLVITE) 1 MG tablet, Take 1 mg by mouth daily.  ,  Disp: , Rfl: ;  gabapentin (NEURONTIN) 100 MG capsule, Take 200 mg by mouth 2 (two) times daily., Disp: , Rfl: ;  loratadine (CLARITIN) 10 MG tablet, Take 10 mg by mouth daily., Disp: , Rfl:  methotrexate 2.5 MG tablet, Take 25 mg by mouth once a week., Disp: , Rfl: ;  traMADol (ULTRAM) 50 MG tablet, Take 50 mg by mouth at bedtime as needed (fibromyalgia pain.)., Disp: , Rfl: ;  doxycycline (VIBRA-TABS) 100 MG tablet, Take 1 tablet (100 mg total) by mouth 2 (two) times daily., Disp: 20 tablet, Rfl: 0  EXAM:  Filed Vitals:   02/13/14 0808  BP: 112/78  Pulse: 79  Temp: 98 F (36.7 C)  O2: 99% on RA  Body mass index is 37.38 kg/(m^2).  GENERAL: vitals reviewed and listed above, alert, oriented, appears well hydrated and in no acute distress  HEENT: atraumatic, conjunttiva clear, no obvious abnormalities on inspection of external nose and ears, normal appearance of ear canals and TMs except for clear effusion R, clear nasal  congestion, mild post oropharyngeal erythema with PND, no tonsillar edema or exudate, no sinus TTP  NECK: no obvious masses on inspection  LUNGS: clear to auscultation bilaterally, no wheezes, rales or rhonchi, good air movement  CV: HRRR, no peripheral edema  MS: moves all extremities without noticeable abnormality  PSYCH: pleasant and cooperative, no obvious depression or anxiety  ASSESSMENT AND PLAN:  Discussed the following assessment and plan:  Acute upper respiratory infections of unspecified site - Plan: doxycycline (VIBRA-TABS) 100 MG tablet, DG Chest 2 View -upper resp symptoms for about 1 month, worsening with mild SOB and chills the last few days, immunosuppressed -opted for abx after discussion risks/benefits and CXR pending -she is scheduled to see ent for her ongoing clear effusion of R ear with tinnitis -follow up closely in 1 week, sooner if any worsening or not improving  Crohn's disease, unspecified complication  -she is to contact her GI doc to let them know she is taking an abx  Fibromyalgia - managed by Dr. Dossie Der, [facility] - takes Neurontin and Tramadol  Palpitations -we discussed possible serious and likely etiologies, workup and treatment, treatment risks and return precautions -after this discussion, Mckell opted for observation as she feels this is related to her resp issues and anxiety -of course, we advised Sumayya  to return or notify a doctor immediately if symptoms worsen or persist or new concerns arise.  .   -given HPI and exam findings today, a serious infection or illness is unlikely. We discussed potential etiologies, with VURI being most likely, and advised supportive care and monitoring. We discussed treatment side effects, likely course, antibiotic misuse, transmission, and signs of developing a serious illness. -of course, we advised to return or notify a doctor immediately if symptoms worsen or persist or new concerns  arise.    Patient Instructions  -go get xray  --As we discussed, we have prescribed a new medication (doxycycline) for you at this appointment. We discussed the common and serious potential adverse effects of this medication and you can review these and more with the pharmacist when you pick up your medication.  Please follow the instructions for use carefully and notify us immediately if you have any problems taking this medication.  -call your gastroenterologist today to let them know you are taking an antibiotic for a respiratory infection and to ask for instructions regarding your humira  -seek care immediately if worsening or new symptoms  -follow up  next week      Colin Benton R.

## 2014-02-13 NOTE — Patient Instructions (Signed)
-  go get xray  --As we discussed, we have prescribed a new medication (doxycycline) for you at this appointment. We discussed the common and serious potential adverse effects of this medication and you can review these and more with the pharmacist when you pick up your medication.  Please follow the instructions for use carefully and notify us immediately if you have any problems taking this medication.  -call your gastroenterologist today to let them know you are taking an antibiotic for a respiratory infection and to ask for instructions regarding your humira  -seek care immediately if worsening or new symptoms  -follow up next week

## 2014-02-13 NOTE — Progress Notes (Signed)
Pre visit review using our clinic review tool, if applicable. No additional management support is needed unless otherwise documented below in the visit note. 

## 2014-02-18 ENCOUNTER — Encounter: Payer: Medicare Other | Admitting: Family Medicine

## 2014-02-18 DIAGNOSIS — Z0289 Encounter for other administrative examinations: Secondary | ICD-10-CM

## 2014-02-18 NOTE — Progress Notes (Signed)
error    This encounter was created in error - please disregard.

## 2014-02-25 DIAGNOSIS — N949 Unspecified condition associated with female genital organs and menstrual cycle: Secondary | ICD-10-CM | POA: Diagnosis not present

## 2014-02-25 DIAGNOSIS — R109 Unspecified abdominal pain: Secondary | ICD-10-CM | POA: Diagnosis not present

## 2014-02-25 DIAGNOSIS — E559 Vitamin D deficiency, unspecified: Secondary | ICD-10-CM | POA: Diagnosis not present

## 2014-02-25 DIAGNOSIS — K920 Hematemesis: Secondary | ICD-10-CM | POA: Diagnosis not present

## 2014-02-25 DIAGNOSIS — R0602 Shortness of breath: Secondary | ICD-10-CM | POA: Diagnosis not present

## 2014-02-25 DIAGNOSIS — R059 Cough, unspecified: Secondary | ICD-10-CM | POA: Diagnosis not present

## 2014-02-25 DIAGNOSIS — K5080 Crohn's disease of both small and large intestine without complications: Secondary | ICD-10-CM | POA: Diagnosis not present

## 2014-02-25 DIAGNOSIS — R300 Dysuria: Secondary | ICD-10-CM | POA: Diagnosis not present

## 2014-02-25 DIAGNOSIS — R509 Fever, unspecified: Secondary | ICD-10-CM | POA: Diagnosis not present

## 2014-02-27 ENCOUNTER — Encounter: Payer: Self-pay | Admitting: Family Medicine

## 2014-03-12 ENCOUNTER — Encounter: Payer: Self-pay | Admitting: Family Medicine

## 2014-03-12 ENCOUNTER — Ambulatory Visit (INDEPENDENT_AMBULATORY_CARE_PROVIDER_SITE_OTHER): Payer: Medicare Other | Admitting: Family Medicine

## 2014-03-12 VITALS — BP 100/80 | HR 85 | Temp 98.4°F | Ht 64.75 in | Wt 224.0 lb

## 2014-03-12 DIAGNOSIS — F3289 Other specified depressive episodes: Secondary | ICD-10-CM

## 2014-03-12 DIAGNOSIS — R002 Palpitations: Secondary | ICD-10-CM

## 2014-03-12 DIAGNOSIS — F329 Major depressive disorder, single episode, unspecified: Secondary | ICD-10-CM | POA: Diagnosis not present

## 2014-03-12 DIAGNOSIS — E669 Obesity, unspecified: Secondary | ICD-10-CM

## 2014-03-12 DIAGNOSIS — M797 Fibromyalgia: Secondary | ICD-10-CM

## 2014-03-12 DIAGNOSIS — F32A Depression, unspecified: Secondary | ICD-10-CM

## 2014-03-12 DIAGNOSIS — IMO0001 Reserved for inherently not codable concepts without codable children: Secondary | ICD-10-CM

## 2014-03-12 DIAGNOSIS — Z23 Encounter for immunization: Secondary | ICD-10-CM

## 2014-03-12 DIAGNOSIS — K5090 Crohn's disease, unspecified, without complications: Secondary | ICD-10-CM

## 2014-03-12 DIAGNOSIS — K50919 Crohn's disease, unspecified, with unspecified complications: Secondary | ICD-10-CM

## 2014-03-12 MED ORDER — DULOXETINE HCL 30 MG PO CPEP: 30.0000 mg | ORAL_CAPSULE | Freq: Every day | ORAL | Status: DC

## 2014-03-12 NOTE — Progress Notes (Signed)
Pre visit review using our clinic review tool, if applicable. No additional management support is needed unless otherwise documented below in the visit note. 

## 2014-03-12 NOTE — Progress Notes (Addendum)
No chief complaint on file.   HPI:  Follow up:  URI: -treated with doxy 02/13/14 -CXR ok, she knows about the scoliosis -she is doing much better -denies: fevers, malaise, coughing, SOB  Palpitation: -mild, she opted for observation last appt -reports: resolved -denies:CP, palpitations, SOB, DOE  Depression and Fibromyalgia: -she was on cymbalta in the past and wants to restart it really helped with the depression, focus and the fribro pain Depression Symptoms: Sleep disorder: occ poor Interest deficit/anhedonia:  yes Guilt (worthlessness, hopelessness, regret): no, ok hopelessness Energy deficit: yes Concentration deficit: poor Appetite disorder: yes Psychomotor retardation or agitation: sluggish Suicidality: no -she takes the tramadol only a few times per week and feels like can back down on this and use tylenol as understand risks with this medication and cymbalta  Obesity: -referred to prep program, she is considering this -advised of medication options   Reports she needs her  flu vaccines - told to get here as her GI office was out of these during her appointment. She reports had her pneumonia vaccine a 5 years ago once.  ROS: See pertinent positives and negatives per HPI.  Past Medical History  Diagnosis Date  . Arthritis     OA of knee and lumbar spine per pt report  . Depression   . Attention deficit disorder     dx at age 43  . Cholesterol serum elevated     on medication in the past  . Attention deficit disorder   . Fainting episodes   . Frequent headaches   . Heartburn   . Pneumonia     spring, 2015  . Crohn's disease - managed by Dr. Coralyn Mark at [place], on Humira 11/20/2013    with fistula, since age 38  . Fibromyalgia - managed by Dr. Dossie Der, [facility] - takes Neurontin and Tramadol 11/20/2013    Past Surgical History  Procedure Laterality Date  . Anal fistula repair       X 4   . Fracture surgery    . Tubal ligation    . Colonoscopy  with dilation      X 2     Family History  Problem Relation Age of Onset  . Hypertension Mother   . Diabetes Father   . Cancer Sister     cervical  . Mental illness Brother     drugs and alcohol abuse    History   Social History  . Marital Status: Single    Spouse Name: N/A    Number of Children: N/A  . Years of Education: N/A   Social History Main Topics  . Smoking status: Never Smoker   . Smokeless tobacco: Never Used  . Alcohol Use: No  . Drug Use: No  . Sexual Activity: Yes   Other Topics Concern  . None   Social History Narrative   Work or School: on disability for disability related to [employer] and managed by Dr. Coralyn Mark; works part time      Home Situation: lives with boyfriend and son (23 yo) in 2015      Spiritual Beliefs: Christian      Lifestyle: regular exercise - walking 3x per week; diet is healthy             Current outpatient prescriptions:acetaminophen (TYLENOL) 500 MG tablet, Take 1,000 mg by mouth every 6 (six) hours as needed. For migraines and stomach upset , Disp: , Rfl: ;  Adalimumab (HUMIRA) 40 MG/0.8ML PSKT, Inject 40 mg into  the skin once a week., Disp: , Rfl: ;  ciprofloxacin (CIPRO) 500 MG tablet, Take 500 mg by mouth 2 (two) times daily. For Crohn's Disease., Disp: , Rfl:  dicyclomine (BENTYL) 10 MG capsule, Take 10 mg by mouth 2 (two) times daily., Disp: , Rfl: ;  fluticasone (FLONASE) 50 MCG/ACT nasal spray, , Disp: , Rfl: ;  gabapentin (NEURONTIN) 100 MG capsule, Take 200 mg by mouth 2 (two) times daily., Disp: , Rfl: ;  loratadine (CLARITIN) 10 MG tablet, Take 10 mg by mouth daily., Disp: , Rfl: ;  methotrexate 2.5 MG tablet, Take 25 mg by mouth once a week., Disp: , Rfl:  traMADol (ULTRAM) 50 MG tablet, Take 50 mg by mouth at bedtime as needed (fibromyalgia pain.)., Disp: , Rfl: ;  DULoxetine (CYMBALTA) 30 MG capsule, Take 1 capsule (30 mg total) by mouth daily., Disp: 30 capsule, Rfl: 1  EXAM:  Filed Vitals:   03/12/14 0957  BP:  100/80  Pulse: 85  Temp: 98.4 F (36.9 C)    Body mass index is 37.55 kg/(m^2).  GENERAL: vitals reviewed and listed above, alert, oriented, appears well hydrated and in no acute distress  HEENT: atraumatic, conjunttiva clear, no obvious abnormalities on inspection of external nose and ears  NECK: no obvious masses on inspection  LUNGS: clear to auscultation bilaterally, no wheezes, rales or rhonchi, good air movement  CV: HRRR, no peripheral edema  MS: moves all extremities without noticeable abnormality  PSYCH: pleasant and cooperative, no obvious depression or anxiety  ASSESSMENT AND PLAN:  Discussed the following assessment and plan:  Depression - Plan: DULoxetine (CYMBALTA) 30 MG capsule -discussed options and risks -discussed interaction with cymbalta and tramadol - she understands risks and will use tramadol rarely -close follow up in 3-4 weeks -advised counseling and exercise as well  Fibromyalgia - managed by Dr. Dossie Der, [facility] - takes Neurontin and Tramadol  Crohn's disease, unspecified complication Vaccines per below  Obesity, unspecified Advised regular exercise and health diet  Palpitations  -CPE in April -Patient advised to return or notify a doctor immediately if symptoms worsen or persist or new concerns arise.  There are no Patient Instructions on file for this visit.   Colin Benton R.

## 2014-03-12 NOTE — Addendum Note (Signed)
Addended by: Agnes Lawrence on: 03/12/2014 10:32 AM   Modules accepted: Orders

## 2014-03-12 NOTE — Patient Instructions (Addendum)
-  start the cymbalta - do not stop this medication suddenly -there are risks with taking this medication with tramadol as we discussed  -get counseling  -healthy diet and regular exercise  -follow up in 3-4 weeks

## 2014-03-17 DIAGNOSIS — R279 Unspecified lack of coordination: Secondary | ICD-10-CM | POA: Diagnosis not present

## 2014-03-17 DIAGNOSIS — M6281 Muscle weakness (generalized): Secondary | ICD-10-CM | POA: Diagnosis not present

## 2014-03-17 DIAGNOSIS — M62838 Other muscle spasm: Secondary | ICD-10-CM | POA: Diagnosis not present

## 2014-04-02 ENCOUNTER — Encounter: Payer: Medicare Other | Admitting: Family Medicine

## 2014-04-02 DIAGNOSIS — R278 Other lack of coordination: Secondary | ICD-10-CM | POA: Diagnosis not present

## 2014-04-02 DIAGNOSIS — R102 Pelvic and perineal pain: Secondary | ICD-10-CM | POA: Diagnosis not present

## 2014-04-02 DIAGNOSIS — M62838 Other muscle spasm: Secondary | ICD-10-CM | POA: Diagnosis not present

## 2014-04-02 DIAGNOSIS — Z0289 Encounter for other administrative examinations: Secondary | ICD-10-CM

## 2014-04-02 DIAGNOSIS — M6281 Muscle weakness (generalized): Secondary | ICD-10-CM | POA: Diagnosis not present

## 2014-04-02 DIAGNOSIS — N3020 Other chronic cystitis without hematuria: Secondary | ICD-10-CM | POA: Diagnosis not present

## 2014-04-02 NOTE — Progress Notes (Signed)
error    This encounter was created in error - please disregard.

## 2014-04-03 DIAGNOSIS — M6281 Muscle weakness (generalized): Secondary | ICD-10-CM | POA: Diagnosis not present

## 2014-04-03 DIAGNOSIS — M62838 Other muscle spasm: Secondary | ICD-10-CM | POA: Diagnosis not present

## 2014-04-03 DIAGNOSIS — N3946 Mixed incontinence: Secondary | ICD-10-CM | POA: Diagnosis not present

## 2014-04-03 DIAGNOSIS — R278 Other lack of coordination: Secondary | ICD-10-CM | POA: Diagnosis not present

## 2014-04-08 DIAGNOSIS — M62838 Other muscle spasm: Secondary | ICD-10-CM | POA: Diagnosis not present

## 2014-04-08 DIAGNOSIS — M6281 Muscle weakness (generalized): Secondary | ICD-10-CM | POA: Diagnosis not present

## 2014-04-08 DIAGNOSIS — R278 Other lack of coordination: Secondary | ICD-10-CM | POA: Diagnosis not present

## 2014-04-08 DIAGNOSIS — N3946 Mixed incontinence: Secondary | ICD-10-CM | POA: Diagnosis not present

## 2014-04-21 ENCOUNTER — Encounter: Payer: Self-pay | Admitting: Family Medicine

## 2014-04-22 ENCOUNTER — Encounter: Payer: Medicare Other | Admitting: Family Medicine

## 2014-04-22 NOTE — Progress Notes (Signed)
Error   This encounter was created in error - please disregard. 

## 2014-05-21 ENCOUNTER — Encounter: Payer: Medicare Other | Admitting: Family Medicine

## 2014-05-21 NOTE — Progress Notes (Signed)
error    This encounter was created in error - please disregard.

## 2014-06-25 ENCOUNTER — Other Ambulatory Visit: Payer: Self-pay | Admitting: Family Medicine

## 2014-07-01 ENCOUNTER — Encounter: Payer: Self-pay | Admitting: Family Medicine

## 2014-07-01 ENCOUNTER — Ambulatory Visit (INDEPENDENT_AMBULATORY_CARE_PROVIDER_SITE_OTHER): Payer: Medicare Other | Admitting: Family Medicine

## 2014-07-01 VITALS — BP 102/80 | HR 79 | Temp 97.6°F | Ht 64.75 in | Wt 218.2 lb

## 2014-07-01 DIAGNOSIS — M797 Fibromyalgia: Secondary | ICD-10-CM

## 2014-07-01 DIAGNOSIS — K50919 Crohn's disease, unspecified, with unspecified complications: Secondary | ICD-10-CM | POA: Diagnosis not present

## 2014-07-01 DIAGNOSIS — F329 Major depressive disorder, single episode, unspecified: Secondary | ICD-10-CM

## 2014-07-01 DIAGNOSIS — J069 Acute upper respiratory infection, unspecified: Secondary | ICD-10-CM

## 2014-07-01 DIAGNOSIS — F32A Depression, unspecified: Secondary | ICD-10-CM

## 2014-07-01 MED ORDER — AZITHROMYCIN 250 MG PO TABS: ORAL_TABLET | ORAL | Status: DC

## 2014-07-01 NOTE — Patient Instructions (Signed)
-  As we discussed, we have prescribed a new medication for you at this appointment. We discussed the common and serious potential adverse effects of this medication and you can review these and more with the pharmacist when you pick up your medication.  Please follow the instructions for use carefully and notify us immediately if you have any problems taking this medication.  -contact your gastroenterologist today regarding recommendations for the humira in light of your current symptoms  -follow up in 2-3 months

## 2014-07-01 NOTE — Progress Notes (Signed)
Pre visit review using our clinic review tool, if applicable. No additional management support is needed unless otherwise documented below in the visit note. 

## 2014-07-01 NOTE — Progress Notes (Signed)
HPI:  URI: -started: 2 weeks ago but has worsened with productive cough, temp 99 at night -symptoms:nasal congestion, sore throat, cough, ? Mild SOB -denies:fever, SOB, NVD, tooth pain, sinus pain, hemoptysis -has tried: OTC cold medication -sick contacts/travel/risks: denies flu exposure, tick exposure or or Ebola risks -Hx of: on humira for IBD - sees Dr. Coralyn Mark in GI for this  Depression: -she missed her follow up appointment -reports doing great and denies: worsening, SI, thoughts of self harm, sleep issues, hopelessness -rarely takes tramadol and aware of interactions with cymbalta  ROS: See pertinent positives and negatives per HPI.  Past Medical History  Diagnosis Date  . Arthritis     OA of knee and lumbar spine per pt report  . Depression   . Attention deficit disorder     dx at age 62  . Cholesterol serum elevated     on medication in the past  . Attention deficit disorder   . Fainting episodes   . Frequent headaches   . Heartburn   . Pneumonia     spring, 2015  . Crohn's disease - managed by Dr. Coralyn Mark at [place], on Humira 11/20/2013    with fistula, since age 42  . Fibromyalgia - managed by Dr. Dossie Der, [facility] - takes Neurontin and Tramadol 11/20/2013    Past Surgical History  Procedure Laterality Date  . Anal fistula repair       X 4   . Fracture surgery    . Tubal ligation    . Colonoscopy with dilation      X 2     Family History  Problem Relation Age of Onset  . Hypertension Mother   . Diabetes Father   . Cancer Sister     cervical  . Mental illness Brother     drugs and alcohol abuse    History   Social History  . Marital Status: Single    Spouse Name: N/A    Number of Children: N/A  . Years of Education: N/A   Social History Main Topics  . Smoking status: Never Smoker   . Smokeless tobacco: Never Used  . Alcohol Use: No  . Drug Use: No  . Sexual Activity: Yes   Other Topics Concern  . None   Social History Narrative    Work or School: on disability for disability related to [employer] and managed by Dr. Coralyn Mark; works part time      Home Situation: lives with boyfriend and son (84 yo) in 2015      Spiritual Beliefs: Christian      Lifestyle: regular exercise - walking 3x per week; diet is healthy             Current outpatient prescriptions: acetaminophen (TYLENOL) 500 MG tablet, Take 1,000 mg by mouth every 6 (six) hours as needed. For migraines and stomach upset , Disp: , Rfl: ;  Adalimumab (HUMIRA) 40 MG/0.8ML PSKT, Inject 40 mg into the skin once a week., Disp: , Rfl: ;  ciprofloxacin (CIPRO) 500 MG tablet, Take 500 mg by mouth 2 (two) times daily. For Crohn's Disease., Disp: , Rfl:  dicyclomine (BENTYL) 10 MG capsule, Take 10 mg by mouth 2 (two) times daily., Disp: , Rfl: ;  DULoxetine (CYMBALTA) 30 MG capsule, TAKE 1 CAPSULE EVERY DAY, Disp: 30 capsule, Rfl: 1;  fluticasone (FLONASE) 50 MCG/ACT nasal spray, , Disp: , Rfl: ;  gabapentin (NEURONTIN) 100 MG capsule, Take 200 mg by mouth 2 (two) times  daily., Disp: , Rfl: ;  methotrexate 2.5 MG tablet, Take 25 mg by mouth once a week., Disp: , Rfl:  traMADol (ULTRAM) 50 MG tablet, Take 50 mg by mouth at bedtime as needed (fibromyalgia pain.)., Disp: , Rfl: ;  azithromycin (ZITHROMAX) 250 MG tablet, 2 tablets today, then 1 tab daily for 4 more days, Disp: 6 tablet, Rfl: 0  EXAM:  Filed Vitals:   07/01/14 1054  BP: 102/80  Pulse: 79  Temp: 97.6 F (36.4 C)    Body mass index is 36.58 kg/(m^2).  GENERAL: vitals reviewed and listed above, alert, oriented, appears well hydrated and in no acute distress  HEENT: atraumatic, conjunttiva clear, no obvious abnormalities on inspection of external nose and ears, normal appearance of ear canals and TMs, clear nasal congestion, mild post oropharyngeal erythema with PND, no tonsillar edema or exudate, no sinus TTP  NECK: no obvious masses on inspection  LUNGS: clear to auscultation bilaterally, no wheezes,  rales or rhonchi, good air movement  CV: HRRR, no peripheral edema  MS: moves all extremities without noticeable abnormality  PSYCH: pleasant and cooperative, no obvious depression or anxiety  ASSESSMENT AND PLAN:  Discussed the following assessment and plan:  Acute upper respiratory infection  Crohn's disease, unspecified complication  Fibromyalgia - managed by Dr. Dossie Der, [facility] - takes Neurontin and Tramadol  Depression  - We discussed potential etiologies, with VURI being most likely, but with immunosuppressed state and symptoms opted to start azithromycin and monitor closely incase developing lower resp tract inf. Advised she notify her gastroenterologist today for recs regarding her humira. We discussed treatment side effects, likely course, antibiotic misuse, transmission, and signs of developing a serious illness. -of course, we advised to return or notify a doctor immediately if symptoms worsen or persist or new concerns arise.    There are no Patient Instructions on file for this visit.   Colin Benton R.

## 2014-07-03 ENCOUNTER — Telehealth: Payer: Self-pay | Admitting: Family Medicine

## 2014-07-03 NOTE — Telephone Encounter (Signed)
[facility]  Patient Name: Katrina Grant  DOB: [birth year]    Nurse Assessment  Nurse: Loletta Specter, RN, Wells Guiles Date/Time (Eastern Time): 07/03/2014 4:09:19 PM  Confirm and document reason for call. If symptomatic, describe symptoms. ---Caller states she's been congested and has Z-pack monday and not feeling better. Congestion is a little better but still lots of mucus and sinus pain. Pt seen in office early in the week.  Has the patient traveled out of the country within the last 30 days? ---Not Applicable  Does the patient require triage? ---Yes  Related visit to physician within the last 2 weeks? ---No  Does the PT have any chronic conditions? (i.e. diabetes, asthma, etc.) ---Yes  List chronic conditions. ---crohns.  Did the patient indicate they were pregnant? ---No     Guidelines    Guideline Title Affirmed Question Affirmed Notes  Sinus Infection on Antibiotic Follow-up Call [1] Difficulty breathing AND [2] not from stuffy nose (e.g., not relieved by cleaning out the nose)    Final Disposition User   Go to ED Now Loletta Specter, RN, Wells Guiles

## 2014-07-03 NOTE — Telephone Encounter (Signed)
I called the pt and she stated she is having problems breathing, especially at night and it often feels as if she is choking and she felt this way when she had pneumonia before and was treated later at [facility].  I advised the pt per Dr Maudie Mercury to go to an urgent care now for treatment as this is different symptoms than when she was seen here.  She stated she does not want to go to an urgent care as they do not know her history and why she is taking Humira and I advised her any physician should know how to treat her. She stated she will go to an urgent care when her friend gets home to watch her son.  I also advised the pt she does not have an appt tomorrow and to call me in the morning if needed.

## 2014-07-03 NOTE — Telephone Encounter (Signed)
Please see if we can get her in here tomorrow? If severe sinus pain, struggling to breath or fevers over 101 should go to urgent care today.

## 2014-07-04 ENCOUNTER — Ambulatory Visit: Payer: Medicare Other | Admitting: Family Medicine

## 2014-07-04 ENCOUNTER — Encounter: Payer: Self-pay | Admitting: Family Medicine

## 2014-07-04 ENCOUNTER — Ambulatory Visit (INDEPENDENT_AMBULATORY_CARE_PROVIDER_SITE_OTHER): Payer: Medicare Other | Admitting: Family Medicine

## 2014-07-04 VITALS — BP 112/72 | HR 81 | Temp 97.8°F | Ht 64.75 in | Wt 220.1 lb

## 2014-07-04 DIAGNOSIS — J019 Acute sinusitis, unspecified: Secondary | ICD-10-CM

## 2014-07-04 MED ORDER — DOXYCYCLINE HYCLATE 100 MG PO TABS: 100.0000 mg | ORAL_TABLET | Freq: Two times a day (BID) | ORAL | Status: DC

## 2014-07-04 NOTE — Progress Notes (Signed)
Pre visit review using our clinic review tool, if applicable. No additional management support is needed unless otherwise documented below in the visit note. 

## 2014-07-04 NOTE — Progress Notes (Signed)
HPI:  URI: -started: about 2 weeks ago -symptoms:nasal congestion, sore throat, PND, cough, denies SOB here, now with sinus pain -tends to have prolonged cough/wheezing/SOB when gets URI and she is concerned for pna -  last visit 2 days ago started on azithromycin to cover for CAP given her concerns -in august she had the same thing happen and did not have pna -denies:fever, hemoptysis, NVD, tooth pain, SOB, wheezing -sick contacts/travel/risks: denies flu exposure, tick exposure or or Ebola risks -she called after hours yesterday and due to worsening SOB per her report then was advised to seek care at [facility], but did not Hx allergies to dust  ROS: See pertinent positives and negatives per HPI.  Past Medical History  Diagnosis Date  . Arthritis     OA of knee and lumbar spine per pt report  . Depression   . Attention deficit disorder     dx at age 44  . Cholesterol serum elevated     on medication in the past  . Attention deficit disorder   . Fainting episodes   . Frequent headaches   . Heartburn   . Pneumonia     spring, 2015  . Crohn's disease - managed by Dr. Coralyn Mark at [place], on Humira 11/20/2013    with fistula, since age 44  . Fibromyalgia - managed by Dr. Dossie Der, [facility] - takes Neurontin and Tramadol 11/20/2013    Past Surgical History  Procedure Laterality Date  . Anal fistula repair       X 4   . Fracture surgery    . Tubal ligation    . Colonoscopy with dilation      X 2     Family History  Problem Relation Age of Onset  . Hypertension Mother   . Diabetes Father   . Cancer Sister     cervical  . Mental illness Brother     drugs and alcohol abuse    History   Social History  . Marital Status: Single    Spouse Name: N/A    Number of Children: N/A  . Years of Education: N/A   Social History Main Topics  . Smoking status: Never Smoker   . Smokeless tobacco: Never Used  . Alcohol Use: No  . Drug Use: No  . Sexual Activity: Yes   Other  Topics Concern  . None   Social History Narrative   Work or School: on disability for disability related to [employer] and managed by Dr. Coralyn Mark; works part time      Home Situation: lives with boyfriend and son (67 yo) in 2015      Spiritual Beliefs: Christian      Lifestyle: regular exercise - walking 3x per week; diet is healthy              Current outpatient prescriptions:  .  acetaminophen (TYLENOL) 500 MG tablet, Take 1,000 mg by mouth every 6 (six) hours as needed. For migraines and stomach upset , Disp: , Rfl:  .  Adalimumab (HUMIRA) 40 MG/0.8ML PSKT, Inject 40 mg into the skin once a week., Disp: , Rfl:  .  ciprofloxacin (CIPRO) 500 MG tablet, Take 500 mg by mouth 2 (two) times daily. For Crohn's Disease., Disp: , Rfl:  .  dicyclomine (BENTYL) 10 MG capsule, Take 10 mg by mouth 2 (two) times daily., Disp: , Rfl:  .  DULoxetine (CYMBALTA) 30 MG capsule, TAKE 1 CAPSULE EVERY DAY, Disp: 30 capsule, Rfl: 1 .  fluticasone (FLONASE) 50 MCG/ACT nasal spray, , Disp: , Rfl:  .  gabapentin (NEURONTIN) 100 MG capsule, Take 200 mg by mouth 2 (two) times daily., Disp: , Rfl:  .  methotrexate 2.5 MG tablet, Take 25 mg by mouth once a week., Disp: , Rfl:  .  traMADol (ULTRAM) 50 MG tablet, Take 50 mg by mouth at bedtime as needed (fibromyalgia pain.)., Disp: , Rfl:  .  doxycycline (VIBRA-TABS) 100 MG tablet, Take 1 tablet (100 mg total) by mouth 2 (two) times daily., Disp: 20 tablet, Rfl: 0  EXAM:  Filed Vitals:   07/04/14 1403  BP: 112/72  Pulse: 81  Temp: 97.8 F (36.6 C)    Body mass index is 36.89 kg/(m^2).  GENERAL: vitals reviewed and listed above, alert, oriented, appears well hydrated and in no acute distress  HEENT: atraumatic, conjunttiva clear, no obvious abnormalities on inspection of external nose and ears, normal appearance of ear canals and TMs, nasal congestion, mild post oropharyngeal erythema with PND, no tonsillar edema or exudate  NECK: no obvious masses on  inspection  LUNGS: clear to auscultation bilaterally, no wheezes, rales or rhonchi, good air movement  CV: HRRR, no peripheral edema  MS: moves all extremities without noticeable abnormality  PSYCH: pleasant and cooperative, no obvious depression or anxiety  ASSESSMENT AND PLAN:  Discussed the following assessment and plan:  Acute sinusitis, recurrence not specified, unspecified location - Plan: doxycycline (VIBRA-TABS) 100 MG tablet  -suspect viral but with immunocompromise and now with symptoms more c/w possible mild sinusitis opted to switch to doxy for better coverage for sinusitis -advised CXR given her hx, though no signs or symptoms of pna  - she declined -follow up next week -of course, we advised to return or notify a doctor immediately if symptoms worsen or persist or new concerns arise.    There are no Patient Instructions on file for this visit.   Colin Benton R.

## 2014-07-04 NOTE — Patient Instructions (Signed)
INSTRUCTIONS FOR UPPER RESPIRATORY INFECTION:  -plenty of rest and fluids  -stop azithromycin and start the doxycycline  -flonase daily for 21 days  -nasal saline wash 2-3 times daily (use prepackaged nasal saline or bottled/distilled water if making your own)   -can AFRIN nasal spray for drainage and nasal congestion - but do NOT use longer then 3-4 days  -can use tylenol or ibuprofen as directed for aches and sorethroat  -in the winter time, using a humidifier at night is helpful (please follow cleaning instructions)  -if you are taking a cough medication - use only as directed, may also try a teaspoon of honey to coat the throat and throat lozenges  -for sore throat, salt water gargles can help  -follow up if you have fevers, facial pain, tooth pain, difficulty breathing or are worsening or not getting better in 5-7 days

## 2014-07-08 ENCOUNTER — Ambulatory Visit: Payer: Medicare Other | Admitting: Family Medicine

## 2014-07-08 NOTE — Progress Notes (Signed)
No show/late cancel

## 2014-07-14 ENCOUNTER — Ambulatory Visit: Payer: Medicare Other | Admitting: Family Medicine

## 2014-08-19 DIAGNOSIS — M797 Fibromyalgia: Secondary | ICD-10-CM | POA: Diagnosis not present

## 2014-08-19 DIAGNOSIS — R1084 Generalized abdominal pain: Secondary | ICD-10-CM | POA: Diagnosis not present

## 2014-08-19 DIAGNOSIS — R110 Nausea: Secondary | ICD-10-CM | POA: Diagnosis not present

## 2014-08-19 DIAGNOSIS — K5090 Crohn's disease, unspecified, without complications: Secondary | ICD-10-CM | POA: Diagnosis not present

## 2014-08-19 DIAGNOSIS — R350 Frequency of micturition: Secondary | ICD-10-CM | POA: Diagnosis not present

## 2014-08-19 DIAGNOSIS — E559 Vitamin D deficiency, unspecified: Secondary | ICD-10-CM | POA: Diagnosis not present

## 2014-08-19 DIAGNOSIS — M545 Low back pain: Secondary | ICD-10-CM | POA: Diagnosis not present

## 2014-08-19 DIAGNOSIS — R197 Diarrhea, unspecified: Secondary | ICD-10-CM | POA: Diagnosis not present

## 2014-08-19 DIAGNOSIS — K50919 Crohn's disease, unspecified, with unspecified complications: Secondary | ICD-10-CM | POA: Diagnosis not present

## 2014-09-01 ENCOUNTER — Ambulatory Visit (INDEPENDENT_AMBULATORY_CARE_PROVIDER_SITE_OTHER): Payer: Medicare Other | Admitting: Family Medicine

## 2014-09-01 ENCOUNTER — Ambulatory Visit (INDEPENDENT_AMBULATORY_CARE_PROVIDER_SITE_OTHER)
Admission: RE | Admit: 2014-09-01 | Discharge: 2014-09-01 | Disposition: A | Discharge status: Home / Self Care | Payer: Medicare Other | Source: Ambulatory Visit | Attending: Family Medicine | Admitting: Family Medicine

## 2014-09-01 ENCOUNTER — Encounter: Payer: Self-pay | Admitting: Family Medicine

## 2014-09-01 VITALS — BP 120/78 | HR 88 | Temp 100.1°F | Ht 64.75 in | Wt 217.6 lb

## 2014-09-01 DIAGNOSIS — R059 Cough, unspecified: Secondary | ICD-10-CM

## 2014-09-01 DIAGNOSIS — R05 Cough: Secondary | ICD-10-CM

## 2014-09-01 DIAGNOSIS — J069 Acute upper respiratory infection, unspecified: Secondary | ICD-10-CM | POA: Diagnosis not present

## 2014-09-01 DIAGNOSIS — R509 Fever, unspecified: Secondary | ICD-10-CM | POA: Diagnosis not present

## 2014-09-01 DIAGNOSIS — R300 Dysuria: Secondary | ICD-10-CM | POA: Diagnosis not present

## 2014-09-01 LAB — POCT URINALYSIS DIPSTICK
Bilirubin, UA: NEGATIVE
GLUCOSE UA: NEGATIVE
Leukocytes, UA: NEGATIVE
NITRITE UA: NEGATIVE
Spec Grav, UA: 1.02
Urobilinogen, UA: 0.2
pH, UA: 5.5

## 2014-09-01 LAB — POCT INFLUENZA A/B
Influenza A, POC: NEGATIVE
Influenza B, POC: NEGATIVE

## 2014-09-01 NOTE — Progress Notes (Signed)
Pre visit review using our clinic review tool, if applicable. No additional management support is needed unless otherwise documented below in the visit note. 

## 2014-09-01 NOTE — Patient Instructions (Addendum)
BEFORE YOU LEAVE: -flu test -urine culture and dip -xray sheet  Go get chest xray today  Plenty of fluids  Tylenol and ibuprofen per instructions as needed  If worsening or fevers not trending down over next few days call immediatetly

## 2014-09-01 NOTE — Progress Notes (Signed)
HPI:  Katrina Grant is a 44 yo F with a PMH sig for Crohn's (sees Dr. Coralyn Mark in GI, [place]) and fribromyalgia (sees Dr. Dossie Der, rheum) here for acute visit:  URI: -started 1 day ago -symptoms: fevers - high around 100, cough, congestion, malaise, NV initially, PND, SOB, sore throat, dysuria - wants urine culture -denies: SOB, hemoptysis, blood in stools -son with the same last week - viral illness - he got better after a few days -holding humira  Dysuria: -some urgency, frequency for 1 week -reports urine checked at GI office and told ok but she wants to do culture -denies: hematuria, flank pain  ROS: See pertinent positives and negatives per HPI.  Past Medical History  Diagnosis Date  . Arthritis     OA of knee and lumbar spine per pt report  . Depression   . Attention deficit disorder     dx at age 44  . Cholesterol serum elevated     on medication in the past  . Attention deficit disorder   . Fainting episodes   . Frequent headaches   . Heartburn   . Pneumonia     spring, 2015  . Crohn's disease - managed by Dr. Coralyn Mark at [place], on Humira 11/20/2013    with fistula, since age 24  . Fibromyalgia - managed by Dr. Dossie Der, [facility] - takes Neurontin and Tramadol 11/20/2013    Past Surgical History  Procedure Laterality Date  . Anal fistula repair       X 4   . Fracture surgery    . Tubal ligation    . Colonoscopy with dilation      X 2     Family History  Problem Relation Age of Onset  . Hypertension Mother   . Diabetes Father   . Cancer Sister     cervical  . Mental illness Brother     drugs and alcohol abuse    History   Social History  . Marital Status: Single    Spouse Name: N/A  . Number of Children: N/A  . Years of Education: N/A   Social History Main Topics  . Smoking status: Never Smoker   . Smokeless tobacco: Never Used  . Alcohol Use: No  . Drug Use: No  . Sexual Activity: Yes   Other Topics Concern  . None   Social History  Narrative   Work or School: on disability for disability related to [employer] and managed by Dr. Coralyn Mark; works part time      Home Situation: lives with boyfriend and son (20 yo) in 2015      Spiritual Beliefs: Christian      Lifestyle: regular exercise - walking 3x per week; diet is healthy              Current outpatient prescriptions:  .  acetaminophen (TYLENOL) 500 MG tablet, Take 1,000 mg by mouth every 6 (six) hours as needed. For migraines and stomach upset , Disp: , Rfl:  .  Adalimumab (HUMIRA) 40 MG/0.8ML PSKT, Inject 40 mg into the skin once a week., Disp: , Rfl:  .  ciprofloxacin (CIPRO) 500 MG tablet, Take 500 mg by mouth 2 (two) times daily. For Crohn's Disease., Disp: , Rfl:  .  dicyclomine (BENTYL) 10 MG capsule, Take 10 mg by mouth 2 (two) times daily., Disp: , Rfl:  .  doxycycline (VIBRA-TABS) 100 MG tablet, Take 1 tablet (100 mg total) by mouth 2 (two) times daily., Disp:  20 tablet, Rfl: 0 .  DULoxetine (CYMBALTA) 30 MG capsule, TAKE 1 CAPSULE EVERY DAY, Disp: 30 capsule, Rfl: 1 .  fluticasone (FLONASE) 50 MCG/ACT nasal spray, , Disp: , Rfl:  .  gabapentin (NEURONTIN) 100 MG capsule, Take 200 mg by mouth 2 (two) times daily., Disp: , Rfl:  .  methotrexate 2.5 MG tablet, Take 25 mg by mouth once a week., Disp: , Rfl:  .  traMADol (ULTRAM) 50 MG tablet, Take 50 mg by mouth at bedtime as needed (fibromyalgia pain.)., Disp: , Rfl:   EXAM:  Filed Vitals:   09/01/14 1037  BP: 120/78  Pulse: 88  Temp: 100.1 F (37.8 C)    Body mass index is 36.48 kg/(m^2).  GENERAL: vitals reviewed and listed above, alert, oriented, appears well hydrated and in no acute distress  HEENT: atraumatic, conjunttiva clear, no obvious abnormalities on inspection of external nose and ears, normal appearance of ear canals and TMs, clear nasal congestion, mild post oropharyngeal erythema with PND, no tonsillar edema or exudate, no sinus TTP  NECK: no obvious masses on inspection  LUNGS:  clear to auscultation bilaterally, no wheezes, rales or rhonchi, good air movement  CV: HRRR, no peripheral edema  ABD: no CVA TTP  MS: moves all extremities without noticeable abnormality  PSYCH: pleasant and cooperative, no obvious depression or anxiety  ASSESSMENT AND PLAN:  Discussed the following assessment and plan:  Acute upper respiratory infection - Plan: POC Influenza A/B  Cough - Plan: DG Chest 2 View  Dysuria - Plan: POC Urinalysis Dipstick, Culture, Urine  -likely viral infection given symptoms and recent exposure - given immunocomprimised : CXR to exclude PNA Flu test and treat with Tamiflu if positive after discussion risks/benefits - negative Udip and culture and treat if indicates infection - udip with bld only and on period, culture pending Strict return precuations/emergency precautions - low threshold to start abx if not improving -Patient advised to return or notify a doctor immediately if symptoms worsen or persist or new concerns arise.  Patient Instructions  BEFORE YOU LEAVE: -flu test -urine culture and dip -xray sheet  Go get chest xray today  Plenty of fluids  Tylenol and ibuprofen per instructions as needed  If worsening or fevers not trending down over next few days call immediatetly      Colin Benton R.

## 2014-09-03 LAB — URINE CULTURE: Colony Count: 2000

## 2014-09-08 ENCOUNTER — Ambulatory Visit (INDEPENDENT_AMBULATORY_CARE_PROVIDER_SITE_OTHER): Payer: Medicare Other | Admitting: Family Medicine

## 2014-09-08 ENCOUNTER — Encounter: Payer: Self-pay | Admitting: Family Medicine

## 2014-09-08 VITALS — BP 120/74 | HR 68 | Temp 97.8°F | Wt 218.0 lb

## 2014-09-08 DIAGNOSIS — R059 Cough, unspecified: Secondary | ICD-10-CM

## 2014-09-08 DIAGNOSIS — R05 Cough: Secondary | ICD-10-CM | POA: Diagnosis not present

## 2014-09-08 MED ORDER — BENZONATATE 200 MG PO CAPS: 200.0000 mg | ORAL_CAPSULE | Freq: Three times a day (TID) | ORAL | Status: DC | PRN

## 2014-09-08 MED ORDER — ONDANSETRON 8 MG PO TBDP: 8.0000 mg | ORAL_TABLET | Freq: Three times a day (TID) | ORAL | Status: DC | PRN

## 2014-09-08 NOTE — Progress Notes (Signed)
Pre visit review using our clinic review tool, if applicable. No additional management support is needed unless otherwise documented below in the visit note. 

## 2014-09-08 NOTE — Patient Instructions (Signed)
Continue to drink fluids to increase hydration. Call immediately if you develop fever.

## 2014-09-08 NOTE — Progress Notes (Signed)
Subjective:    Patient ID: Katrina Grant, female    DOB: 21-Aug-1970, 44 y.o.   MRN: [id number]  HPI  Acute Follow-up Patient 44 year old female, nonsmoker, history of Crohn's disease managed with Humira injections, seen today for follow-up of an acute respiratory infection times nine days.  Previously seen by Dr. Colin Benton 09/01/14 and diagnosed with an acute respiratory infection.  Dr. Jarrett Soho ordered chest x-ray which was unremarkable.  Patient presents with complaint of continued cough, nausea, vomiting, nasal congestions.  Patient reports being afebrile times 5 days and reports symptoms have improved since 09/01/14.  However, she is still troubled by "vomiting" thick sputum and persistent cough.    Review of Systems  Constitutional: Positive for appetite change and fatigue. Negative for fever, chills, diaphoresis and unexpected weight change.  HENT: Positive for congestion and rhinorrhea.   Eyes: Negative.   Respiratory: Positive for cough. Negative for shortness of breath and wheezing.   Cardiovascular: Negative.   Gastrointestinal: Positive for nausea and diarrhea.  Endocrine: Negative.   Genitourinary: Negative.   Musculoskeletal: Negative.   Skin: Negative.   Allergic/Immunologic: Positive for environmental allergies.  Neurological: Negative.   Hematological: Negative.   Psychiatric/Behavioral: Negative.       . Past Medical History  Diagnosis Date  . Arthritis     OA of knee and lumbar spine per pt report  . Depression   . Attention deficit disorder     dx at age 58  . Cholesterol serum elevated     on medication in the past  . Attention deficit disorder   . Fainting episodes   . Frequent headaches   . Heartburn   . Pneumonia     spring, 2015  . Crohn's disease - managed by Dr. Coralyn Mark at [place], on Humira 11/20/2013    with fistula, since age 61  . Fibromyalgia - managed by Dr. Dossie Der, [facility] - takes Neurontin and Tramadol 11/20/2013    History    Social History  . Marital Status: Single    Spouse Name: N/A  . Number of Children: N/A  . Years of Education: N/A   Occupational History  . Not on file.   Social History Main Topics  . Smoking status: Never Smoker   . Smokeless tobacco: Never Used  . Alcohol Use: No  . Drug Use: No  . Sexual Activity: Yes   Other Topics Concern  . Not on file   Social History Narrative   Work or School: on disability for disability related to Crohn's and managed by Dr. Coralyn Mark; works part time      Home Situation: lives with boyfriend and son (51 yo) in 2015      Spiritual Beliefs: Christian      Lifestyle: regular exercise - walking 3x per week; diet is healthy             Past Surgical History  Procedure Laterality Date  . Anal fistula repair       X 4   . Fracture surgery    . Tubal ligation    . Colonoscopy with dilation      X 2     Family History  Problem Relation Age of Onset  . Hypertension Mother   . Diabetes Father   . Cancer Sister     cervical  . Mental illness Brother     drugs and alcohol abuse    Allergies  Allergen Reactions  . Percocet [Oxycodone-Acetaminophen] Nausea  And Vomiting    Current Outpatient Prescriptions on File Prior to Visit  Medication Sig Dispense Refill  . acetaminophen (TYLENOL) 500 MG tablet Take 1,000 mg by mouth every 6 (six) hours as needed. For migraines and stomach upset     . Adalimumab (HUMIRA) 40 MG/0.8ML PSKT Inject 40 mg into the skin once a week.    . ciprofloxacin (CIPRO) 500 MG tablet Take 500 mg by mouth 2 (two) times daily. For Crohn's Disease.    . dicyclomine (BENTYL) 10 MG capsule Take 10 mg by mouth 2 (two) times daily.    . DULoxetine (CYMBALTA) 30 MG capsule TAKE 1 CAPSULE EVERY DAY 30 capsule 1  . fluticasone (FLONASE) 50 MCG/ACT nasal spray     . gabapentin (NEURONTIN) 100 MG capsule Take 200 mg by mouth 2 (two) times daily.    . methotrexate 2.5 MG tablet Take 25 mg by mouth once a week.    . traMADol  (ULTRAM) 50 MG tablet Take 50 mg by mouth at bedtime as needed (fibromyalgia pain.).     No current facility-administered medications on file prior to visit.    BP 120/74 mmHg  Pulse 68  Temp(Src) 97.8 F (36.6 C) (Oral)  Wt 218 lb (98.884 kg)  LMP 09/01/2014   Objective:   Physical Exam  Constitutional: She is oriented to person, place, and time. She appears well-developed and well-nourished. No distress.  HENT:  Head: Normocephalic and atraumatic.  Right Ear: External ear normal.  Left Ear: External ear normal.  Nose: Nose normal.  Mouth/Throat: Oropharynx is clear and moist.  Eyes: Conjunctivae and EOM are normal. Pupils are equal, round, and reactive to light. Right eye exhibits no discharge. Left eye exhibits no discharge.  Neck: Normal range of motion. Neck supple.  Cardiovascular: Normal rate, regular rhythm and normal heart sounds.   Pulmonary/Chest: Effort normal and breath sounds normal.  Musculoskeletal: Normal range of motion.  Neurological: She is alert and oriented to person, place, and time. She has normal reflexes.  Skin: Skin is warm and dry.  Psychiatric: She has a normal mood and affect. Her behavior is normal. Judgment and thought content normal.          Assessment & Plan:  1. Acute Upper Respiratory Illness: Likely viral illness.  Treatment indicated is symptom management.  She is immunocompromised but no fever and non-focal exam.  2. Cough: Take Benzonatate 200 mg  3. Nausea: Take Zofran 8 mg

## 2014-09-09 ENCOUNTER — Other Ambulatory Visit: Payer: Self-pay | Admitting: *Deleted

## 2014-09-09 DIAGNOSIS — R319 Hematuria, unspecified: Secondary | ICD-10-CM

## 2014-09-15 ENCOUNTER — Other Ambulatory Visit: Payer: Medicare Other

## 2014-10-01 DIAGNOSIS — M1990 Unspecified osteoarthritis, unspecified site: Secondary | ICD-10-CM | POA: Diagnosis not present

## 2014-10-01 DIAGNOSIS — K5090 Crohn's disease, unspecified, without complications: Secondary | ICD-10-CM | POA: Diagnosis not present

## 2014-10-01 DIAGNOSIS — K621 Rectal polyp: Secondary | ICD-10-CM | POA: Diagnosis not present

## 2014-10-01 DIAGNOSIS — K5140 Inflammatory polyps of colon without complications: Secondary | ICD-10-CM | POA: Diagnosis not present

## 2014-10-01 DIAGNOSIS — D128 Benign neoplasm of rectum: Secondary | ICD-10-CM | POA: Diagnosis not present

## 2014-10-01 DIAGNOSIS — K5080 Crohn's disease of both small and large intestine without complications: Secondary | ICD-10-CM | POA: Diagnosis not present

## 2014-10-01 DIAGNOSIS — K219 Gastro-esophageal reflux disease without esophagitis: Secondary | ICD-10-CM | POA: Diagnosis not present

## 2014-10-01 DIAGNOSIS — K529 Noninfective gastroenteritis and colitis, unspecified: Secondary | ICD-10-CM | POA: Diagnosis not present

## 2014-10-01 DIAGNOSIS — K6389 Other specified diseases of intestine: Secondary | ICD-10-CM | POA: Diagnosis not present

## 2014-10-01 DIAGNOSIS — K50818 Crohn's disease of both small and large intestine with other complication: Secondary | ICD-10-CM | POA: Diagnosis not present

## 2014-10-01 DIAGNOSIS — M797 Fibromyalgia: Secondary | ICD-10-CM | POA: Diagnosis not present

## 2014-10-06 LAB — HM COLONOSCOPY

## 2014-10-13 ENCOUNTER — Encounter: Payer: Self-pay | Admitting: Family Medicine

## 2014-10-22 ENCOUNTER — Telehealth: Payer: Self-pay

## 2014-10-22 MED ORDER — DULOXETINE HCL 30 MG PO CPEP: 30.0000 mg | ORAL_CAPSULE | Freq: Every day | ORAL | Status: DC

## 2014-10-22 NOTE — Telephone Encounter (Signed)
[facility] refill request for DULoxetine (CYMBALTA) 30 MG capsule

## 2014-10-22 NOTE — Telephone Encounter (Signed)
Rx done. 

## 2014-10-28 ENCOUNTER — Other Ambulatory Visit: Payer: Self-pay | Admitting: Family Medicine

## 2014-11-25 DIAGNOSIS — K50819 Crohn's disease of both small and large intestine with unspecified complications: Secondary | ICD-10-CM | POA: Diagnosis not present

## 2015-01-20 ENCOUNTER — Ambulatory Visit (INDEPENDENT_AMBULATORY_CARE_PROVIDER_SITE_OTHER): Payer: Medicare Other | Admitting: Family Medicine

## 2015-01-20 ENCOUNTER — Encounter: Payer: Self-pay | Admitting: Family Medicine

## 2015-01-20 ENCOUNTER — Other Ambulatory Visit: Payer: Self-pay | Admitting: Family Medicine

## 2015-01-20 VITALS — BP 118/78 | HR 78 | Temp 98.0°F | Ht 64.75 in | Wt 221.3 lb

## 2015-01-20 DIAGNOSIS — R300 Dysuria: Secondary | ICD-10-CM | POA: Diagnosis not present

## 2015-01-20 DIAGNOSIS — N390 Urinary tract infection, site not specified: Secondary | ICD-10-CM | POA: Diagnosis not present

## 2015-01-20 DIAGNOSIS — Z3200 Encounter for pregnancy test, result unknown: Secondary | ICD-10-CM

## 2015-01-20 LAB — POCT URINALYSIS DIPSTICK
Bilirubin, UA: NEGATIVE
Glucose, UA: NEGATIVE
Ketones, UA: NEGATIVE
Leukocytes, UA: NEGATIVE
NITRITE UA: POSITIVE
PH UA: 6
PROTEIN UA: NEGATIVE
Urobilinogen, UA: 0.2

## 2015-01-20 LAB — POCT URINE PREGNANCY: Preg Test, Ur: NEGATIVE

## 2015-01-20 MED ORDER — NITROFURANTOIN MONOHYD MACRO 100 MG PO CAPS: 100.0000 mg | ORAL_CAPSULE | Freq: Two times a day (BID) | ORAL | Status: DC

## 2015-01-20 NOTE — Progress Notes (Signed)
Pre visit review using our clinic review tool, if applicable. No additional management support is needed unless otherwise documented below in the visit note. 

## 2015-01-20 NOTE — Patient Instructions (Signed)
Take antibiotic as instructed  Follow up as needed

## 2015-01-20 NOTE — Progress Notes (Signed)
HPI:  Acute visit for:  Dysuria: -started 1 week ago -frequency, urgency, dysuria -denies: fevers, vomiting, vaginal symptoms, flank pain, abd pain -on humira -has periods every few months, no chance of pregnancy  ROS: See pertinent positives and negatives per HPI.  Past Medical History  Diagnosis Date  . Arthritis     OA of knee and lumbar spine per pt report  . Depression   . Attention deficit disorder     dx at age 44  . Cholesterol serum elevated     on medication in the past  . Attention deficit disorder   . Fainting episodes   . Frequent headaches   . Heartburn   . Pneumonia     spring, 2015  . Crohn's disease - managed by Dr. Coralyn Mark at [place], on Humira 11/20/2013    with fistula, since age 44  . Fibromyalgia - managed by Dr. Dossie Der, [facility] - takes Neurontin and Tramadol 11/20/2013    Past Surgical History  Procedure Laterality Date  . Anal fistula repair       X 4   . Fracture surgery    . Tubal ligation    . Colonoscopy with dilation      X 2     Family History  Problem Relation Age of Onset  . Hypertension Mother   . Diabetes Father   . Cancer Sister     cervical  . Mental illness Brother     drugs and alcohol abuse    History   Social History  . Marital Status: Single    Spouse Name: N/A  . Number of Children: N/A  . Years of Education: N/A   Social History Main Topics  . Smoking status: Never Smoker   . Smokeless tobacco: Never Used  . Alcohol Use: No  . Drug Use: No  . Sexual Activity: Yes   Other Topics Concern  . None   Social History Narrative   Work or School: on disability for disability related to [employer] and managed by Dr. Coralyn Mark; works part time      Home Situation: lives with boyfriend and son (60 yo) in 2015      Spiritual Beliefs: Christian      Lifestyle: regular exercise - walking 3x per week; diet is healthy              Current outpatient prescriptions:  .  acetaminophen (TYLENOL) 500 MG tablet,  Take 1,000 mg by mouth every 6 (six) hours as needed. For migraines and stomach upset , Disp: , Rfl:  .  Adalimumab (HUMIRA) 40 MG/0.8ML PSKT, Inject 40 mg into the skin once a week., Disp: , Rfl:  .  benzonatate (TESSALON) 200 MG capsule, Take 1 capsule (200 mg total) by mouth 3 (three) times daily as needed., Disp: 20 capsule, Rfl: 0 .  ciprofloxacin (CIPRO) 500 MG tablet, Take 500 mg by mouth 2 (two) times daily. For Crohn's Disease., Disp: , Rfl:  .  dicyclomine (BENTYL) 10 MG capsule, Take 10 mg by mouth 2 (two) times daily., Disp: , Rfl:  .  DULoxetine (CYMBALTA) 30 MG capsule, TAKE 1 CAPSULE (30 MG TOTAL) BY MOUTH DAILY. (NEEDS OFFICE VISIT), Disp: 30 capsule, Rfl: 0 .  fluticasone (FLONASE) 50 MCG/ACT nasal spray, , Disp: , Rfl:  .  gabapentin (NEURONTIN) 100 MG capsule, Take 200 mg by mouth 2 (two) times daily., Disp: , Rfl:  .  methotrexate 2.5 MG tablet, Take 25 mg by mouth once a  week., Disp: , Rfl:  .  ondansetron (ZOFRAN ODT) 8 MG disintegrating tablet, Take 1 tablet (8 mg total) by mouth every 8 (eight) hours as needed for nausea or vomiting., Disp: 30 tablet, Rfl: 0 .  traMADol (ULTRAM) 50 MG tablet, Take 50 mg by mouth at bedtime as needed (fibromyalgia pain.)., Disp: , Rfl:  .  nitrofurantoin, macrocrystal-monohydrate, (MACROBID) 100 MG capsule, Take 1 capsule (100 mg total) by mouth 2 (two) times daily., Disp: 14 capsule, Rfl: 0  EXAM:  Filed Vitals:   01/20/15 1408  BP: 118/78  Pulse: 78  Temp: 98 F (36.7 C)    Body mass index is 37.1 kg/(m^2).  GENERAL: vitals reviewed and listed above, alert, oriented, appears well hydrated and in no acute distress  HEENT: atraumatic, conjunttiva clear, no obvious abnormalities on inspection of external nose and ears  NECK: no obvious masses on inspection  LUNGS: clear to auscultation bilaterally, no wheezes, rales or rhonchi, good air movement  CV: HRRR, no peripheral edema  MS: moves all extremities without noticeable  abnormality  PSYCH: pleasant and cooperative, no obvious depression or anxiety  ASSESSMENT AND PLAN:  Discussed the following assessment and plan:  Dysuria - Plan: POC Urinalysis Dipstick  UTI (lower urinary tract infection)  -udip with nit and bld, cx pending, u preg -she opted for macrobid empirically, and she reports new gastroenterologist has advised holding humira during this -return and follow up precautions discussed -Patient advised to return or notify a doctor immediately if symptoms worsen or persist or new concerns arise.  There are no Patient Instructions on file for this visit.   Colin Benton R.

## 2015-01-20 NOTE — Addendum Note (Signed)
Addended by: Agnes Lawrence on: 01/20/2015 02:40 PM   Modules accepted: Orders

## 2015-01-24 LAB — URINE CULTURE

## 2015-02-06 ENCOUNTER — Encounter: Payer: Self-pay | Admitting: Family Medicine

## 2015-02-06 ENCOUNTER — Ambulatory Visit (INDEPENDENT_AMBULATORY_CARE_PROVIDER_SITE_OTHER): Payer: Medicare Other | Admitting: Family Medicine

## 2015-02-06 VITALS — BP 102/80 | HR 82 | Temp 98.0°F | Ht 64.75 in | Wt 219.0 lb

## 2015-02-06 DIAGNOSIS — N921 Excessive and frequent menstruation with irregular cycle: Secondary | ICD-10-CM | POA: Diagnosis not present

## 2015-02-06 DIAGNOSIS — R5383 Other fatigue: Secondary | ICD-10-CM

## 2015-02-06 DIAGNOSIS — N6322 Unspecified lump in the left breast, upper inner quadrant: Secondary | ICD-10-CM

## 2015-02-06 DIAGNOSIS — N63 Unspecified lump in breast: Secondary | ICD-10-CM

## 2015-02-06 DIAGNOSIS — N938 Other specified abnormal uterine and vaginal bleeding: Secondary | ICD-10-CM | POA: Diagnosis not present

## 2015-02-06 LAB — CBC WITH DIFFERENTIAL/PLATELET
BASOS ABS: 0 10*3/uL (ref 0.0–0.1)
Basophils Relative: 0.4 % (ref 0.0–3.0)
EOS ABS: 0.2 10*3/uL (ref 0.0–0.7)
Eosinophils Relative: 4.1 % (ref 0.0–5.0)
HCT: 40.1 % (ref 36.0–46.0)
HEMOGLOBIN: 13.3 g/dL (ref 12.0–15.0)
LYMPHS PCT: 51 % — AB (ref 12.0–46.0)
Lymphs Abs: 2.6 10*3/uL (ref 0.7–4.0)
MCHC: 33.2 g/dL (ref 30.0–36.0)
MCV: 89.3 fl (ref 78.0–100.0)
MONO ABS: 0.3 10*3/uL (ref 0.1–1.0)
Monocytes Relative: 5.9 % (ref 3.0–12.0)
Neutro Abs: 1.9 10*3/uL (ref 1.4–7.7)
Neutrophils Relative %: 38.6 % — ABNORMAL LOW (ref 43.0–77.0)
Platelets: 238 10*3/uL (ref 150.0–400.0)
RBC: 4.49 Mil/uL (ref 3.87–5.11)
RDW: 14.9 % (ref 11.5–15.5)
WBC: 5 10*3/uL (ref 4.0–10.5)

## 2015-02-06 LAB — TSH: TSH: 1.88 u[IU]/mL (ref 0.35–4.50)

## 2015-02-06 NOTE — Progress Notes (Signed)
Pre visit review using our clinic review tool, if applicable. No additional management support is needed unless otherwise documented below in the visit note. 

## 2015-02-06 NOTE — Patient Instructions (Addendum)
Follow up in 1 month Labs before you leave  -We placed a referral for you as discussed for a diagnostic mammogram. It usually takes about 1-2 weeks to process and schedule this referral. If you have not heard from [place] regarding this appointment in 2 weeks please contact our office.

## 2015-02-06 NOTE — Progress Notes (Signed)
HPI:  Breast Lump: -noticed last night -she reports she does self exams eand feels like this just pooped up -denies: rash, discharge, pain FDLMP: started 3 days ago Reports has been several years since she had a mammogram  Heavy menstrual bleeding: -irr and heavy for about 6 months, hx of DUB and saw a gynecologist several years ago -periods every 2 months, last for 7 days, heavy bleeding the first few days -no spotting between periods -denies: fevers, malaise, chills  ROS: See pertinent positives and negatives per HPI.  Past Medical History  Diagnosis Date  . Arthritis     OA of knee and lumbar spine per pt report  . Depression   . Attention deficit disorder     dx at age 14  . Cholesterol serum elevated     on medication in the past  . Attention deficit disorder   . Fainting episodes   . Frequent headaches   . Heartburn   . Pneumonia     spring, 2015  . Crohn's disease - managed by Dr. Coralyn Mark at [place], on Humira 11/20/2013    with fistula, since age 64  . Fibromyalgia - managed by Dr. Dossie Der, [facility] - takes Neurontin and Tramadol 11/20/2013    Past Surgical History  Procedure Laterality Date  . Anal fistula repair       X 4   . Fracture surgery    . Tubal ligation    . Colonoscopy with dilation      X 2     Family History  Problem Relation Age of Onset  . Hypertension Mother   . Diabetes Father   . Cancer Sister     cervical  . Mental illness Brother     drugs and alcohol abuse    Social History   Social History  . Marital Status: Single    Spouse Name: N/A  . Number of Children: N/A  . Years of Education: N/A   Social History Main Topics  . Smoking status: Never Smoker   . Smokeless tobacco: Never Used  . Alcohol Use: No  . Drug Use: No  . Sexual Activity: Yes   Other Topics Concern  . None   Social History Narrative   Work or School: on disability for disability related to [employer] and managed by Dr. Coralyn Mark; works part time       Home Situation: lives with boyfriend and son (44 yo) in 2015      Spiritual Beliefs: Christian      Lifestyle: regular exercise - walking 3x per week; diet is healthy              Current outpatient prescriptions:  .  acetaminophen (TYLENOL) 500 MG tablet, Take 1,000 mg by mouth every 6 (six) hours as needed. For migraines and stomach upset , Disp: , Rfl:  .  Adalimumab (HUMIRA) 40 MG/0.8ML PSKT, Inject 40 mg into the skin once a week., Disp: , Rfl:  .  benzonatate (TESSALON) 200 MG capsule, Take 1 capsule (200 mg total) by mouth 3 (three) times daily as needed., Disp: 20 capsule, Rfl: 0 .  ciprofloxacin (CIPRO) 500 MG tablet, Take 500 mg by mouth 2 (two) times daily. For Crohn's Disease., Disp: , Rfl:  .  dicyclomine (BENTYL) 10 MG capsule, Take 10 mg by mouth 2 (two) times daily., Disp: , Rfl:  .  DULoxetine (CYMBALTA) 30 MG capsule, TAKE 1 CAPSULE (30 MG TOTAL) BY MOUTH DAILY. (NEEDS OFFICE VISIT), Disp: 30  capsule, Rfl: 0 .  fluticasone (FLONASE) 50 MCG/ACT nasal spray, , Disp: , Rfl:  .  gabapentin (NEURONTIN) 100 MG capsule, Take 200 mg by mouth 2 (two) times daily., Disp: , Rfl:  .  methotrexate 2.5 MG tablet, Take 25 mg by mouth once a week., Disp: , Rfl:  .  nitrofurantoin, macrocrystal-monohydrate, (MACROBID) 100 MG capsule, Take 1 capsule (100 mg total) by mouth 2 (two) times daily., Disp: 14 capsule, Rfl: 0 .  ondansetron (ZOFRAN ODT) 8 MG disintegrating tablet, Take 1 tablet (8 mg total) by mouth every 8 (eight) hours as needed for nausea or vomiting., Disp: 30 tablet, Rfl: 0 .  traMADol (ULTRAM) 50 MG tablet, Take 50 mg by mouth at bedtime as needed (fibromyalgia pain.)., Disp: , Rfl:   EXAM:  Filed Vitals:   02/06/15 0917  BP: 102/80  Pulse: 82  Temp: 98 F (36.7 C)    Body mass index is 36.71 kg/(m^2).  GENERAL: vitals reviewed and listed above, alert, oriented, appears well hydrated and in no acute distress  HEENT: atraumatic, conjunttiva clear, no obvious  abnormalities on inspection of external nose and ears  NECK: no obvious masses on inspection  BREAST: normal appearance of skin and nipple, no discharge, on L breast exam: firm, mobile, mildly tender ~ 31m in diameter mass in L breast 6 cm medial to areola at 10 O'clock position, some fibrocystic changes bilat  MS: moves all extremities without noticeable abnormality  PSYCH: pleasant and cooperative, no obvious depression or anxiety  ASSESSMENT AND PLAN:  Discussed the following assessment and plan:  Breast lump on left side at 10 o'clock position - Plan: MM Digital Diagnostic Unilat L  DUB (dysfunctional uterine bleeding) - Plan: CBC with Differential, TSH  Menorrhagia with irregular cycle - Plan: CBC with Differential, TSH  Other fatigue - Plan: TSH  -diagnostic mammo -labs -follow up in 1 month -Patient advised to return or notify a doctor immediately if symptoms worsen or persist or new concerns arise.  Patient Instructions  Follow up in 1 month Labs before you leave  -We placed a referral for you as discussed for a diagnostic mammogram. It usually takes about 1-2 weeks to process and schedule this referral. If you have not heard from [place] this appointment in 2 weeks please contact our office.      KColin BentonR.

## 2015-02-17 DIAGNOSIS — Z79899 Other long term (current) drug therapy: Secondary | ICD-10-CM | POA: Diagnosis not present

## 2015-02-17 DIAGNOSIS — M797 Fibromyalgia: Secondary | ICD-10-CM | POA: Diagnosis not present

## 2015-02-17 DIAGNOSIS — R140 Abdominal distension (gaseous): Secondary | ICD-10-CM | POA: Diagnosis not present

## 2015-02-17 DIAGNOSIS — R159 Full incontinence of feces: Secondary | ICD-10-CM | POA: Diagnosis not present

## 2015-02-17 DIAGNOSIS — K580 Irritable bowel syndrome with diarrhea: Secondary | ICD-10-CM | POA: Diagnosis not present

## 2015-02-17 DIAGNOSIS — R110 Nausea: Secondary | ICD-10-CM | POA: Diagnosis not present

## 2015-02-17 DIAGNOSIS — R918 Other nonspecific abnormal finding of lung field: Secondary | ICD-10-CM | POA: Diagnosis not present

## 2015-02-17 DIAGNOSIS — K50819 Crohn's disease of both small and large intestine with unspecified complications: Secondary | ICD-10-CM | POA: Diagnosis not present

## 2015-03-06 ENCOUNTER — Other Ambulatory Visit: Payer: Self-pay | Admitting: Family Medicine

## 2015-03-06 DIAGNOSIS — N6322 Unspecified lump in the left breast, upper inner quadrant: Secondary | ICD-10-CM

## 2015-03-09 ENCOUNTER — Ambulatory Visit: Payer: Medicare Other | Admitting: Family Medicine

## 2015-03-12 ENCOUNTER — Ambulatory Visit (INDEPENDENT_AMBULATORY_CARE_PROVIDER_SITE_OTHER): Payer: Medicare Other | Admitting: Family Medicine

## 2015-03-12 ENCOUNTER — Ambulatory Visit
Admission: RE | Admit: 2015-03-12 | Discharge: 2015-03-12 | Disposition: A | Discharge status: Home / Self Care | Payer: Medicare Other | Source: Ambulatory Visit | Attending: Family Medicine | Admitting: Family Medicine

## 2015-03-12 ENCOUNTER — Encounter: Payer: Self-pay | Admitting: Family Medicine

## 2015-03-12 VITALS — BP 108/72 | HR 112 | Temp 98.1°F | Ht 64.75 in | Wt 222.3 lb

## 2015-03-12 DIAGNOSIS — N6322 Unspecified lump in the left breast, upper inner quadrant: Secondary | ICD-10-CM

## 2015-03-12 DIAGNOSIS — F411 Generalized anxiety disorder: Secondary | ICD-10-CM

## 2015-03-12 DIAGNOSIS — Z23 Encounter for immunization: Secondary | ICD-10-CM | POA: Diagnosis not present

## 2015-03-12 DIAGNOSIS — N63 Unspecified lump in unspecified breast: Secondary | ICD-10-CM

## 2015-03-12 DIAGNOSIS — F909 Attention-deficit hyperactivity disorder, unspecified type: Secondary | ICD-10-CM

## 2015-03-12 DIAGNOSIS — N926 Irregular menstruation, unspecified: Secondary | ICD-10-CM | POA: Diagnosis not present

## 2015-03-12 DIAGNOSIS — F331 Major depressive disorder, recurrent, moderate: Secondary | ICD-10-CM | POA: Diagnosis not present

## 2015-03-12 DIAGNOSIS — F988 Other specified behavioral and emotional disorders with onset usually occurring in childhood and adolescence: Secondary | ICD-10-CM

## 2015-03-12 MED ORDER — DULOXETINE HCL 30 MG PO CPEP: ORAL_CAPSULE | ORAL | Status: DC

## 2015-03-12 NOTE — Progress Notes (Signed)
HPI:  Pt showed up late and we worked her in - reports told wrong time for her appointment.  Breast Limp: -reports had mammo and [place] today and told normal, no change  GAD/Depression/ADD: -used to go to [employer] -ran out of cymbalta 3 days ago and feeling worse - irritable, depressed mood, poor sleep -also planning a wedding and feels like needs some help with focus issues, but counselor left -a little nervous about medication changes -denies: SI, thoughts of self harm  Irr bleeding: -seeing gyn, but has not scheduled follow up as advised, agrees to do so -did have periods this month, 7 days, heavy bleeding th efirst few days   ROS: See pertinent positives and negatives per HPI.  Past Medical History  Diagnosis Date  . Depression   . Cholesterol serum elevated     on medication in the past  . Fibromyalgia - managed by Dr. Dossie Der, [facility] - takes Neurontin and Tramadol 11/20/2013  . Osteoarthritis     knee, spine  . Crohn's disease of colon with fistula     sees [facility]. Coralyn Mark, [facility]  . ADD (attention deficit disorder)   . Frequent headaches   . GERD (gastroesophageal reflux disease)   . Syncope   . Pneumonia     Past Surgical History  Procedure Laterality Date  . Anal fistula repair       X 4   . Fracture surgery    . Tubal ligation    . Colonoscopy with dilation      X 2     Family History  Problem Relation Age of Onset  . Hypertension Mother   . Diabetes Father   . Cancer Sister     cervical  . Mental illness Brother     drugs and alcohol abuse    Social History   Social History  . Marital Status: Single    Spouse Name: N/A  . Number of Children: N/A  . Years of Education: N/A   Social History Main Topics  . Smoking status: Never Smoker   . Smokeless tobacco: Never Used  . Alcohol Use: No  . Drug Use: No  . Sexual Activity: Yes   Other Topics Concern  . None   Social History Narrative   Work or School: on disability for disability  related to [employer] and managed by Dr. Coralyn Mark; works part time      Home Situation: lives with boyfriend and son (32 yo) in 2015      Spiritual Beliefs: Christian      Lifestyle: regular exercise - walking 3x per week; diet is healthy              Current outpatient prescriptions:  .  acetaminophen (TYLENOL) 500 MG tablet, Take 1,000 mg by mouth every 6 (six) hours as needed. For migraines and stomach upset , Disp: , Rfl:  .  Adalimumab (HUMIRA) 40 MG/0.8ML PSKT, Inject 40 mg into the skin once a week., Disp: , Rfl:  .  ciprofloxacin (CIPRO) 500 MG tablet, Take 500 mg by mouth 2 (two) times daily. For Crohn's Disease., Disp: , Rfl:  .  dicyclomine (BENTYL) 10 MG capsule, Take 10 mg by mouth 2 (two) times daily., Disp: , Rfl:  .  DULoxetine (CYMBALTA) 30 MG capsule, TAKE 1 CAPSULE (30 MG TOTAL) BY MOUTH DAILY. (NEEDS OFFICE VISIT), Disp: 30 capsule, Rfl: 0 .  fluticasone (FLONASE) 50 MCG/ACT nasal spray, , Disp: , Rfl:  .  gabapentin (NEURONTIN) 100  MG capsule, Take 200 mg by mouth 2 (two) times daily., Disp: , Rfl:  .  methotrexate 2.5 MG tablet, Take 25 mg by mouth once a week., Disp: , Rfl:  .  nitrofurantoin, macrocrystal-monohydrate, (MACROBID) 100 MG capsule, Take 1 capsule (100 mg total) by mouth 2 (two) times daily., Disp: 14 capsule, Rfl: 0 .  ondansetron (ZOFRAN ODT) 8 MG disintegrating tablet, Take 1 tablet (8 mg total) by mouth every 8 (eight) hours as needed for nausea or vomiting., Disp: 30 tablet, Rfl: 0 .  traMADol (ULTRAM) 50 MG tablet, Take 50 mg by mouth at bedtime as needed (fibromyalgia pain.)., Disp: , Rfl:   EXAM:  Filed Vitals:   03/12/15 1441  BP: 108/72  Pulse: 112  Temp: 98.1 F (36.7 C)    Body mass index is 37.26 kg/(m^2).  GENERAL: vitals reviewed and listed above, alert, oriented, appears well hydrated and in no acute distress  HEENT: atraumatic, conjunttiva clear, no obvious abnormalities on inspection of external nose and ears  NECK: no obvious  masses on inspection  LUNGS: clear to auscultation bilaterally, no wheezes, rales or rhonchi, good air movement  CV: HRRR, no peripheral edema  MS: moves all extremities without noticeable abnormality  PSYCH: pleasant and cooperative, no obvious depression or anxiety  ASSESSMENT AND PLAN:  Discussed the following assessment and plan:  Major depressive disorder, recurrent episode, moderate  GAD (generalized anxiety disorder)  ADD (attention deficit disorder)  Breast lump  Irregular menstrual bleeding  -refill cymbalta -await breast mammo/us results -advised cbt and number provided to call -advised to follow up with gyn -follow up 3 months -Patient advised to return or notify a doctor immediately if symptoms worsen or persist or new concerns arise.  There are no Patient Instructions on file for this visit.   Colin Benton R.

## 2015-03-12 NOTE — Progress Notes (Signed)
Pre visit review using our clinic review tool, if applicable. No additional management support is needed unless otherwise documented below in the visit note. 

## 2015-03-12 NOTE — Addendum Note (Signed)
Addended by: Lucretia Kern on: 03/12/2015 03:12 PM   Modules accepted: Orders

## 2015-03-12 NOTE — Patient Instructions (Signed)
BEFORE YOU LEAVE: -see if wants flu shot -follow up appt in 3 months  Take cymbalta daily and avoid stopping suddenly and avoid tramadol use with cymbalta  Schedule counseling  Follow up with your gynecologist

## 2015-03-12 NOTE — Addendum Note (Signed)
Addended by: Agnes Lawrence on: 03/12/2015 02:46 PM   Modules accepted: Orders, Medications

## 2015-03-12 NOTE — Progress Notes (Signed)
NO SHOW. Katrina Grant, can you check to make sure she did the mammogram or help schedule if not? Thanks!

## 2015-04-01 ENCOUNTER — Encounter: Payer: Self-pay | Admitting: Family Medicine

## 2015-04-01 ENCOUNTER — Ambulatory Visit (INDEPENDENT_AMBULATORY_CARE_PROVIDER_SITE_OTHER): Payer: Medicare Other | Admitting: Family Medicine

## 2015-04-01 VITALS — BP 112/74 | HR 80 | Temp 98.2°F | Ht 64.75 in | Wt 224.0 lb

## 2015-04-01 DIAGNOSIS — N390 Urinary tract infection, site not specified: Secondary | ICD-10-CM

## 2015-04-01 LAB — POCT URINALYSIS DIPSTICK
Bilirubin, UA: NEGATIVE
GLUCOSE UA: NEGATIVE
Ketones, UA: NEGATIVE
LEUKOCYTES UA: NEGATIVE
NITRITE UA: NEGATIVE
Protein, UA: NEGATIVE
Spec Grav, UA: >=1.03
UROBILINOGEN UA: 0.2
pH, UA: 6

## 2015-04-01 MED ORDER — SULFAMETHOXAZOLE-TRIMETHOPRIM 800-160 MG PO TABS: 1.0000 | ORAL_TABLET | Freq: Two times a day (BID) | ORAL | Status: DC

## 2015-04-01 MED ORDER — FLUCONAZOLE 150 MG PO TABS: 150.0000 mg | ORAL_TABLET | Freq: Once | ORAL | Status: DC

## 2015-04-01 MED ORDER — FLUCONAZOLE 150 MG PO TABS: 2.0000 mg | ORAL_TABLET | Freq: Once | ORAL | Status: DC

## 2015-04-01 NOTE — Addendum Note (Signed)
Addended by: Aggie Hacker A on: 04/01/2015 04:42 PM   Modules accepted: Orders

## 2015-04-01 NOTE — Progress Notes (Signed)
   Subjective:    Patient ID: Katrina Grant, female    DOB: 1970-07-09, 44 y.o.   MRN: [id number]  HPI Here for 4 days of urgency to urinate with burning. No fever. She was seen for a UTI with E coli in August and was treated wit Macrobid (she is already on daily Cipro for maintenance). Her symptoms improved for a few weeks but they returned in Sept. She was given another round of macrobid and again she felt better for awhile. Now the same sx are back.    Review of Systems  Constitutional: Negative.   Genitourinary: Positive for dysuria, urgency and frequency.       Objective:   Physical Exam  Constitutional: She appears well-developed and well-nourished.  Abdominal: Soft. Bowel sounds are normal. She exhibits no distension and no mass. There is no tenderness. There is no rebound and no guarding.          Assessment & Plan:  Recurrent UTI. Drink plenty of water. We will reculture the sample today. Try Bactrim DS.

## 2015-04-01 NOTE — Progress Notes (Signed)
Pre visit review using our clinic review tool, if applicable. No additional management support is needed unless otherwise documented below in the visit note. 

## 2015-04-03 LAB — URINE CULTURE: Colony Count: 30000

## 2015-07-17 DIAGNOSIS — Z79899 Other long term (current) drug therapy: Secondary | ICD-10-CM | POA: Diagnosis not present

## 2015-07-17 DIAGNOSIS — K5080 Crohn's disease of both small and large intestine without complications: Secondary | ICD-10-CM | POA: Diagnosis not present

## 2015-07-17 DIAGNOSIS — K580 Irritable bowel syndrome with diarrhea: Secondary | ICD-10-CM | POA: Diagnosis not present

## 2015-07-17 DIAGNOSIS — M797 Fibromyalgia: Secondary | ICD-10-CM | POA: Diagnosis not present

## 2015-07-17 DIAGNOSIS — R5383 Other fatigue: Secondary | ICD-10-CM | POA: Diagnosis not present

## 2015-09-04 ENCOUNTER — Other Ambulatory Visit: Payer: Self-pay | Admitting: Family Medicine

## 2015-09-10 ENCOUNTER — Ambulatory Visit: Payer: Medicare Other | Admitting: Family Medicine

## 2015-10-31 ENCOUNTER — Other Ambulatory Visit: Payer: Self-pay | Admitting: Family Medicine

## 2016-02-07 ENCOUNTER — Other Ambulatory Visit: Payer: Self-pay | Admitting: Family Medicine

## 2016-03-27 ENCOUNTER — Other Ambulatory Visit: Payer: Self-pay | Admitting: Family Medicine

## 2016-03-28 NOTE — Telephone Encounter (Signed)
Please call patient. Needs appointment. Once appointment scheduled a refill up to 1 month to get to the appointment.

## 2016-03-29 NOTE — Telephone Encounter (Signed)
I left a detailed message with the information below at the pts home number.

## 2016-03-30 ENCOUNTER — Telehealth: Payer: Self-pay | Admitting: Family Medicine

## 2016-03-30 NOTE — Telephone Encounter (Signed)
Pt would like for you to know that she only has medicaid and can not afford to pay out of pocket she state that she no longer has Medicare.  Wanted to know if you would refill her medication this one time so that she will have medication while looking for a doctor that will accept Medicaid.

## 2016-04-04 NOTE — Telephone Encounter (Signed)
I called the pt and she stated she has [place] [employer]-no type listed on her card, not [place] [employer]. Per Earlene Plater our office does not accept any type of Medicaid.

## 2016-04-04 NOTE — Telephone Encounter (Signed)
Can you check to see what type of medicaid she has and if our office takes her medicaid? IF so, please let her know and help her to schedule appt.If we do not take her insurance, please provide 30 days refills to assist while she is establishing elsewhere. Thank you.

## 2016-04-06 NOTE — Telephone Encounter (Signed)
I thought we did take some types of Medicaid now. Perhaps there has been a change. Can you check with York Cerise on this? Thanks. Can provide 30 days refills.

## 2016-04-07 ENCOUNTER — Telehealth: Payer: Self-pay | Admitting: *Deleted

## 2016-04-07 MED ORDER — DULOXETINE HCL 30 MG PO CPEP: 30.0000 mg | ORAL_CAPSULE | Freq: Every day | ORAL | 0 refills | Status: DC

## 2016-04-07 NOTE — Telephone Encounter (Signed)
Rx was printed and faxed to the pts pharmacy.  Message sent to [facility] in regards to the pts insurance.

## 2016-04-07 NOTE — Addendum Note (Signed)
Addended by: Agnes Lawrence on: 04/07/2016 07:19 AM   Modules accepted: Orders

## 2016-04-07 NOTE — Telephone Encounter (Signed)
Error-Rx was sent via [facility] to the pts pharmacy.

## 2016-11-23 NOTE — Progress Notes (Signed)
HPI:  Follow up. Not seen in some time as was going elsewhere. Now has medicare and is due for medicare exam.  Cough: -started about 5-6 days ago -cough, congestion, thick mucus, occ sob -wants to check for pna as had it in the past -no fevers, wheezing, NVD, body aches  GAD/Depression/ADD: -used to go to [facility] psych -on cymbalta in the past - now doing better and not on medications -does want to see someone about ADD on stimulant in the past and wants to go back to school this fall -denies: SI, thoughts of self harm  Irr bleeding: -seeing gyn in the past -now feels is perimenopausal - skips periods, plans to see gyn for paps  Crohn's: -sees Domenica Fail, [facility] for management -meds: no meds currently, feeling goo but will be getting back on humira soon  Fibromyalgia: -used to see Dr. Dossie Der at [place], now doing much better  ROS: See pertinent positives and negatives per HPI.  Past Medical History:  Diagnosis Date  . ADD (attention deficit disorder)   . Cholesterol serum elevated    on medication in the past  . Crohn's disease of colon with fistula ([place])    sees Dr. Coralyn Mark, [facility]  . Depression   . Fibromyalgia - managed by Dr. Dossie Der, [facility] - takes Neurontin and Tramadol 11/20/2013  . Frequent headaches   . GERD (gastroesophageal reflux disease)   . Osteoarthritis    knee, spine  . Pneumonia   . Syncope     Past Surgical History:  Procedure Laterality Date  . anal fistula repair      X 4   . colonoscopy with dilation     X 2   . FRACTURE SURGERY    . TUBAL LIGATION      Family History  Problem Relation Age of Onset  . Hypertension Mother   . Diabetes Father   . Cancer Sister        cervical  . Mental illness Brother        drugs and alcohol abuse    Social History   Social History  . Marital status: Single    Spouse name: N/A  . Number of children: N/A  . Years of education: N/A   Social History Main Topics  . Smoking status: Never Smoker  .  Smokeless tobacco: Never Used  . Alcohol use No  . Drug use: No  . Sexual activity: Yes   Other Topics Concern  . None   Social History Narrative   Work or School: on disability for disability related to [employer] and managed by Dr. Coralyn Mark; works part time      Home Situation: lives with boyfriend and son (26 yo) in 2015      Spiritual Beliefs: Christian      Lifestyle: regular exercise - walking 3x per week; diet is healthy              Current Outpatient Prescriptions:  .  acetaminophen (TYLENOL) 500 MG tablet, Take 1,000 mg by mouth every 6 (six) hours as needed. For migraines and stomach upset , Disp: , Rfl:  .  Adalimumab (HUMIRA) 40 MG/0.8ML PSKT, Inject 40 mg into the skin once a week., Disp: , Rfl:  .  ciprofloxacin (CIPRO) 500 MG tablet, Take 500 mg by mouth 2 (two) times daily. For Crohn's Disease., Disp: , Rfl:  .  dicyclomine (BENTYL) 10 MG capsule, Take 10 mg by mouth 2 (two) times daily., Disp: , Rfl:  .  fluticasone (FLONASE) 50 MCG/ACT nasal spray, , Disp: , Rfl:  .  ondansetron (ZOFRAN ODT) 8 MG disintegrating tablet, Take 1 tablet (8 mg total) by mouth every 8 (eight) hours as needed for nausea or vomiting., Disp: 30 tablet, Rfl: 0 .  benzonatate (TESSALON PERLES) 100 MG capsule, Take 1 capsule (100 mg total) by mouth 3 (three) times daily as needed., Disp: 20 capsule, Rfl: 0  EXAM:  Vitals:   11/24/16 0830  BP: 98/60  Pulse: 69  Temp: 98.1 F (36.7 C)    Body mass index is 38.45 kg/m.  GENERAL: vitals reviewed and listed above, alert, oriented, appears well hydrated and in no acute distress  HEENT: atraumatic, conjunttiva clear, no obvious abnormalities on inspection of external nose and ears  NECK: no obvious masses on inspection  LUNGS: clear to auscultation bilaterally, no wheezes, rales or rhonchi, good air movement  CV: HRRR, no peripheral edema  MS: moves all extremities without noticeable abnormality  PSYCH: pleasant and cooperative, no  obvious depression or anxiety  ASSESSMENT AND PLAN:  Discussed the following assessment and plan:  Cough - Plan: DG Chest 2 View -suspect VURI, will get CXR to exclude other -tessalon for cough, symptomatic care and return precuations  Attention deficit disorder, unspecified hyperactivity presence -advised evaluation with Dr. Glennon Hamilton and starting with nonpharmacologic interventions which she is agreeable to, Car Att Focus specialist if medication needed  Crohn's disease with complication, unspecified gastrointestinal tract location [facility]) - -sees Dr. Domenica Fail, [facility]  for management -sees GI for management  BMI 38.0-38.9,adult - Plan: Lipid panel, Hemoglobin A1c -labs, lifestyle recs  -Patient advised to return or notify a doctor immediately if symptoms worsen or persist or new concerns arise.  Patient Instructions  BEFORE YOU LEAVE: -follow up: 6 months -labs -xray sheet  Please call to set up annual gynecology exam with pap.  Please call to schedule evaluation with Dr. Glennon Hamilton about the focus issues.  Go get the xray. I sent a cough medication for the cough. Follow up if worsening, new symptoms or not improving.  We have ordered labs or studies at this visit. It can take up to 1-2 weeks for results and processing. IF results require follow up or explanation, we will call you with instructions. Clinically stable results will be released to your [facility]. If you have not heard from [place] or cannot find your results in [facility] in 2 weeks please contact our office at [phone].  If you are not yet signed up for [facility], please consider signing up.   We recommend the following healthy lifestyle for LIFE: 1) Small portions.   Tip: eat off of a salad plate instead of a dinner plate.  Tip: It is ok to feel hungry after a meal of proper portion sizes  Tip: if you need more or a snack choose fruits, veggies and/or a handful of nuts or seeds.  2) Eat a healthy clean diet.  * Tip: Avoid (less  then 1 serving per week): processed foods, sweets, sweetened drinks, white starches (rice, flour, bread, potatoes, pasta, etc), red meat, fast foods, butter  *Tip: CHOOSE instead   * 5-9 servings per day of fresh or frozen fruits and vegetables (but not corn, potatoes, bananas, canned or dried fruit)   *nuts and seeds, beans   *olives and olive oil   *small portions of lean meats such as fish and white chicken    *small portions of whole grains  3)Get at least 150 minutes of sweaty  aerobic exercise per week.  4)Reduce stress - consider counseling, meditation and relaxation to balance other aspects of your life.       Colin Benton R., DO

## 2016-11-24 ENCOUNTER — Ambulatory Visit (INDEPENDENT_AMBULATORY_CARE_PROVIDER_SITE_OTHER): Payer: BLUE CROSS/BLUE SHIELD | Admitting: Family Medicine

## 2016-11-24 ENCOUNTER — Other Ambulatory Visit: Payer: Self-pay | Admitting: Family Medicine

## 2016-11-24 ENCOUNTER — Ambulatory Visit (INDEPENDENT_AMBULATORY_CARE_PROVIDER_SITE_OTHER)
Admission: RE | Admit: 2016-11-24 | Discharge: 2016-11-24 | Disposition: A | Discharge status: Home / Self Care | Payer: BLUE CROSS/BLUE SHIELD | Source: Ambulatory Visit | Attending: Family Medicine | Admitting: Family Medicine

## 2016-11-24 ENCOUNTER — Encounter: Payer: Self-pay | Admitting: Family Medicine

## 2016-11-24 VITALS — BP 98/60 | HR 69 | Temp 98.1°F | Ht 64.75 in | Wt 229.3 lb

## 2016-11-24 DIAGNOSIS — R05 Cough: Secondary | ICD-10-CM

## 2016-11-24 DIAGNOSIS — R059 Cough, unspecified: Secondary | ICD-10-CM

## 2016-11-24 DIAGNOSIS — Z6838 Body mass index (BMI) 38.0-38.9, adult: Secondary | ICD-10-CM | POA: Diagnosis not present

## 2016-11-24 DIAGNOSIS — F988 Other specified behavioral and emotional disorders with onset usually occurring in childhood and adolescence: Secondary | ICD-10-CM | POA: Diagnosis not present

## 2016-11-24 DIAGNOSIS — K50919 Crohn's disease, unspecified, with unspecified complications: Secondary | ICD-10-CM

## 2016-11-24 LAB — LIPID PANEL
CHOLESTEROL: 187 mg/dL (ref 0–200)
HDL: 48.8 mg/dL (ref >39.00)
LDL CALC: 109 mg/dL — AB (ref 0–99)
NonHDL: 138.59
TRIGLYCERIDES: 150 mg/dL — AB (ref 0.0–149.0)
Total CHOL/HDL Ratio: 4
VLDL: 30 mg/dL (ref 0.0–40.0)

## 2016-11-24 LAB — HEMOGLOBIN A1C: Hgb A1c MFr Bld: 5.7 % (ref 4.6–6.5)

## 2016-11-24 MED ORDER — BENZONATATE 100 MG PO CAPS: 100.0000 mg | ORAL_CAPSULE | Freq: Three times a day (TID) | ORAL | 0 refills | Status: DC | PRN

## 2016-11-24 NOTE — Patient Instructions (Signed)
BEFORE YOU LEAVE: -follow up: 6 months -labs -xray sheet  Please call to set up annual gynecology exam with pap.  Please call to schedule evaluation with Dr. Glennon Hamilton about the focus issues.  Go get the xray. I sent a cough medication for the cough. Follow up if worsening, new symptoms or not improving.  We have ordered labs or studies at this visit. It can take up to 1-2 weeks for results and processing. IF results require follow up or explanation, we will call you with instructions. Clinically stable results will be released to your [facility]. If you have not heard from [place] or cannot find your results in [facility] in 2 weeks please contact our office at [phone].  If you are not yet signed up for [facility], please consider signing up.   We recommend the following healthy lifestyle for LIFE: 1) Small portions.   Tip: eat off of a salad plate instead of a dinner plate.  Tip: It is ok to feel hungry after a meal of proper portion sizes  Tip: if you need more or a snack choose fruits, veggies and/or a handful of nuts or seeds.  2) Eat a healthy clean diet.  * Tip: Avoid (less then 1 serving per week): processed foods, sweets, sweetened drinks, white starches (rice, flour, bread, potatoes, pasta, etc), red meat, fast foods, butter  *Tip: CHOOSE instead   * 5-9 servings per day of fresh or frozen fruits and vegetables (but not corn, potatoes, bananas, canned or dried fruit)   *nuts and seeds, beans   *olives and olive oil   *small portions of lean meats such as fish and white chicken    *small portions of whole grains  3)Get at least 150 minutes of sweaty aerobic exercise per week.  4)Reduce stress - consider counseling, meditation and relaxation to balance other aspects of your life.

## 2016-11-25 NOTE — Telephone Encounter (Signed)
Told me was off meds at appt. Also she plans to see pscyh. Please find out if pharmacy requested or pt requesting?

## 2016-11-25 NOTE — Telephone Encounter (Signed)
I called the pt and she stated she is no longer taking Cymbalta.  Rx denial sent to the pharmacy.

## 2016-12-07 DIAGNOSIS — K50919 Crohn's disease, unspecified, with unspecified complications: Secondary | ICD-10-CM | POA: Diagnosis not present

## 2016-12-22 DIAGNOSIS — K610 Anal abscess: Principal | ICD-10-CM

## 2016-12-23 ENCOUNTER — Ambulatory Visit
Admission: RE | Admit: 2016-12-23 | Discharge: 2016-12-23 | Disposition: A | Discharge status: Home / Self Care | Payer: BC Managed Care – PPO | Attending: Anesthesiology | Admitting: Anesthesiology

## 2016-12-23 ENCOUNTER — Ambulatory Visit
Admission: RE | Admit: 2016-12-23 | Discharge: 2016-12-23 | Disposition: A | Discharge status: Home / Self Care | Payer: BC Managed Care – PPO | Admitting: Surgery

## 2016-12-23 DIAGNOSIS — Z6838 Body mass index (BMI) 38.0-38.9, adult: Secondary | ICD-10-CM | POA: Diagnosis not present

## 2016-12-23 DIAGNOSIS — M1990 Unspecified osteoarthritis, unspecified site: Secondary | ICD-10-CM | POA: Diagnosis not present

## 2016-12-23 DIAGNOSIS — R309 Painful micturition, unspecified: Secondary | ICD-10-CM | POA: Diagnosis not present

## 2016-12-23 DIAGNOSIS — K610 Anal abscess: Secondary | ICD-10-CM | POA: Diagnosis not present

## 2016-12-23 DIAGNOSIS — K50814 Crohn's disease of both small and large intestine with abscess: Secondary | ICD-10-CM | POA: Diagnosis not present

## 2016-12-23 DIAGNOSIS — E669 Obesity, unspecified: Secondary | ICD-10-CM | POA: Diagnosis not present

## 2016-12-23 DIAGNOSIS — Z79899 Other long term (current) drug therapy: Secondary | ICD-10-CM | POA: Diagnosis not present

## 2016-12-23 DIAGNOSIS — M797 Fibromyalgia: Secondary | ICD-10-CM | POA: Diagnosis not present

## 2016-12-23 DIAGNOSIS — Z79891 Long term (current) use of opiate analgesic: Secondary | ICD-10-CM | POA: Diagnosis not present

## 2016-12-23 DIAGNOSIS — Z885 Allergy status to narcotic agent status: Secondary | ICD-10-CM | POA: Diagnosis not present

## 2016-12-23 MED ORDER — OXYCODONE 5 MG TABLET
ORAL_TABLET | ORAL | 0 refills | 0.00000 days | PRN
Start: 2016-12-23 — End: 2016-12-28

## 2016-12-23 MED ORDER — POLYETHYLENE GLYCOL 3350 17 GRAM ORAL POWDER PACKET
Freq: Every day | ORAL | 0 refills | 0.00000 days | Status: CP | PRN
Start: 2016-12-23 — End: 2016-12-26

## 2017-01-02 MED ORDER — DICYCLOMINE 10 MG CAPSULE
ORAL_CAPSULE | 1 refills | 0 days | Status: CP
Start: 2017-01-02 — End: 2017-07-25

## 2017-01-02 MED ORDER — HUMIRA PEN 40 MG/0.8 ML SUBCUTANEOUS KIT
1 refills | 0 days | Status: CP
Start: 2017-01-02 — End: 2017-07-03

## 2017-01-03 ENCOUNTER — Ambulatory Visit
Admission: RE | Admit: 2017-01-03 | Discharge: 2017-01-03 | Disposition: A | Discharge status: Home / Self Care | Payer: BC Managed Care – PPO

## 2017-01-03 DIAGNOSIS — Z09 Encounter for follow-up examination after completed treatment for conditions other than malignant neoplasm: Secondary | ICD-10-CM | POA: Diagnosis not present

## 2017-01-03 DIAGNOSIS — N360 Urethral fistula: Secondary | ICD-10-CM | POA: Diagnosis not present

## 2017-01-03 MED ORDER — OXYCODONE 5 MG TABLET
ORAL_TABLET | Freq: Four times a day (QID) | ORAL | 0 refills | 0 days | Status: CP | PRN
Start: 2017-01-03 — End: ?

## 2017-01-03 NOTE — Unmapped (Signed)
For questions about today's visit with Dr. Elenore Rota you may contact:    Marrianne Mood, RN  Nurse Coordinator for Dr. Riley Nearing  Tel: [phone]  Fax: [phone]  Email: [email or web address]

## 2017-01-03 NOTE — Unmapped (Signed)
Lower GI Surgery Clinic Note  Date: 01/03/2017  Attending: Mickle Asper, MD    CC: Post operative follow up s/p EUA and incision and drainage of a perianal fluid collection on 12/23/2016    S: Jasmine Mitchell is a 46 year old woman with Crohn's disease (primarily ileal and perianal disease). She was recently evaluated for an area of persistent pain, induration, and drainage along the left perianal area in a region where she had many previous fistulotomies (see below)    10/08/2009  Subcutaneous fistulotomy of perianal subcutaneous fistulas.    12/23/2009  Rectal EUA, I/D of perianal fluid collection    She presents to clinic today for similar concerns. Near the site of recent I&D, she notes an indurated area that was tender and drained serosanginous fluid over the weekend. She denies fevers/chills. She endorses continued hard stools that require her to strain with bowel movements. She is taking colace but no other laxatives or stool softeners at this time.    O:  Blood pressure 116/74, pulse 72, temperature 36.7 ??C, height 162.6 cm (5' 4.02), weight (!) 103.4 kg (227 lb 15.3 oz), not currently breastfeeding.  Body mass index is 39.11 kg/m??.    General - Jasmine Mitchell woman. Seated comfortably.  Neuro - alert, oriented, and interactive  Resp - breathing comfortably on room air  CV - sinus rhythm, hemodynamically normal/stable  Abd - soft, nondistended  Perianal - there is an open wound along the left buttock at the 9 o'clock positioe near the anus with healthy appearing granulation tissue. Between this wound and the anus there is an indurated area. The area is tender. There is no purulent drainage or erythema. With probing, the open wound tracks inferiorly but does not appear to track with the indurated area.  Ext - warm and well perfused    We performed a procedure to open this indurated, draining peri-anal area in clinic.  15 ml of 1% lidocaine was injected around the indurated lesion and regionally to achieve a field block.  After adequate analgesia was achieved, the 1.5 cm area between the open wound and the indurated area where Jasmine Mitchell reported drainage was opened using a scalpel. A small (~2 mm wide) area of skin was excised to facilitate continued drainage. Hemostasis was adequate and the procedure was completed.    A/P:  Jasmine Mitchell is a 46 year old woman with Crohn's disease. She recently underwent an I&D of a perianal fluid collection. Today, she presented to clinic with persistent induration and drainage from an area adjacent to the site of her prior I&D. She underwent further incision and drainage today in clinic. She should continue to care for the area as she had been doing, by keeping it clean and dry as much as possible. We also recommended she try miralax over the counter for constipation and straining with bowel movements. She will follow up with [place] in clinic on an as needed basis.    Hetty Blend, MD  Pager [phone]

## 2017-01-05 NOTE — Unmapped (Signed)
Specialty Pharmacy Refill Coordination Note     Jasmine Mitchell. Jasmine Mitchell is a 46 y.o. female contacted today regarding refills of her specialty medication(s).    Reviewed and verified with patient:     [name]  [place] [address]  Specialty medication(s) and dose(s) confirmed: yes  Changes to medications: no  Changes to insurance: no    Medication Adherence    Patient reported X missed doses in the last month:  0  Specialty Medication:  HUMIRA  Medication Assistance Program  Refill Coordination  Has the Patient's Contact Information Changed:  No  Is the Shipping Address Different:  No  Shipping Information  Delivery Scheduled:  Yes  Delivery Date:  01/10/17  Medications to be Shipped:  HUMIRA               Ardyth Man  Specialty Pharmacy Technician

## 2017-01-20 ENCOUNTER — Ambulatory Visit: Admission: RE | Admit: 2017-01-20 | Discharge: 2017-01-20 | Disposition: A | Discharge status: Home / Self Care

## 2017-01-20 DIAGNOSIS — K50818 Crohn's disease of both small and large intestine with other complication: Principal | ICD-10-CM

## 2017-01-20 LAB — SEDIMENTATION RATE, MANUAL: ERYTHROCYTE SEDIMENTATION RATE: 25 mm/h — ABNORMAL HIGH (ref 0–20)

## 2017-01-20 LAB — COMPREHENSIVE METABOLIC PANEL
ALBUMIN: 4.2 g/dL (ref 3.5–5.0)
ALT (SGPT): 20 U/L (ref 15–48)
ANION GAP: 8 mmol/L — ABNORMAL LOW (ref 9–15)
BILIRUBIN TOTAL: 0.7 mg/dL (ref 0.0–1.2)
BLOOD UREA NITROGEN: 13 mg/dL (ref 7–21)
BUN / CREAT RATIO: 17
CALCIUM: 9.4 mg/dL (ref 8.5–10.2)
CHLORIDE: 102 mmol/L (ref 98–107)
CO2: 27 mmol/L (ref 22.0–30.0)
CREATININE: 0.75 mg/dL (ref 0.60–1.00)
EGFR MDRD AF AMER: >=60 mL/min/{1.73_m2} (ref >=60)
EGFR MDRD NON AF AMER: >=60 mL/min/{1.73_m2} (ref >=60)
GLUCOSE RANDOM: 95 mg/dL (ref 65–179)
POTASSIUM: 4.3 mmol/L (ref 3.5–5.0)
SODIUM: 137 mmol/L (ref 135–145)

## 2017-01-20 LAB — BILIRUBIN TOTAL: Bilirubin:MCnc:Pt:Ser/Plas:Qn:: 0.7

## 2017-01-20 LAB — C-REACTIVE PROTEIN: C reactive protein:MCnc:Pt:Ser/Plas:Qn:: 5.3

## 2017-01-20 LAB — ERYTHROCYTE SEDIMENTATION RATE: Lab: 25 — ABNORMAL HIGH

## 2017-01-20 LAB — VITAMIN B-12: Cobalamins:MCnc:Pt:Ser/Plas:Qn:: 352

## 2017-01-20 LAB — TOTAL IRON BINDING CAPACITY (CALC): Lab: 415.3

## 2017-01-20 LAB — CBC
HEMATOCRIT: 41.9 % (ref 36.0–46.0)
HEMOGLOBIN: 13.4 g/dL (ref 12.0–16.0)
MEAN CORPUSCULAR HEMOGLOBIN CONC: 32 g/dL (ref 31.0–37.0)
MEAN CORPUSCULAR HEMOGLOBIN: 28.4 pg (ref 26.0–34.0)
MEAN CORPUSCULAR VOLUME: 88.8 fL (ref 80.0–100.0)
PLATELET COUNT: 251 10*9/L (ref 150–440)
RED BLOOD CELL COUNT: 4.72 10*12/L (ref 4.00–5.20)
WBC ADJUSTED: 5.4 10*9/L (ref 4.5–11.0)

## 2017-01-20 LAB — MEAN CORPUSCULAR HEMOGLOBIN CONC: Lab: 32

## 2017-01-20 LAB — FERRITIN: Ferritin:MCnc:Pt:Ser/Plas:Qn:: 14.7

## 2017-01-20 LAB — IRON & TIBC
IRON: 80 ug/dL (ref 35–165)
TOTAL IRON BINDING CAPACITY (CALC): 415.3 mg/dL (ref 252.0–479.0)

## 2017-01-20 NOTE — Unmapped (Signed)
[facility]   FOLLOWUP NOTE - INFLAMMATORY BOWEL DISEASE  01/20/2017    Demographics:  Jasmine Mitchell is a 46 y.o. year old female    Diagnosis:  Crohn's Disease  Disease onset (yr): 6  Age at onset:   < 43 yr old (A1)  Location:  Ileocolonic (L3) (mostly colonic)  Behavior:  Perianal (P), Stricturing (B2)  Current Tight Control Scenario:   Maintenance = Biologic          HPI / NOTE :     Today, I saw [name] for followup in the [facility].    HPI:    Interval Events:   1.  Reviewed operative note from colorectal surgery (Dr. Elenore Rota - 12/23/16) - had EUA + drainage of a perianal fluid coloection.  Also reviewed clinic visit with Dr. Elenore Rota on 7/17 with additional drainage of the perianal area was performed.   2.  Reviewed labs from 11/24/16 by her PCP (care everywhere), Hgb A1c shows pre-diabetes and cholesterol also high  3.  Ms. [name] reports anorectal pain with passage of BMs.  She reports stools are hard to pass.  Has to strain even when the stools are not hard.   4.  Reports looser stools and upset stomach for the past 1 week which she attributes to life stressors, son going to school, work situation, finances.    5.  Weight going up, attributes this to poor diet and stress eating  6.  Reports ongoing issues with stress, anxiety and some mild depressed mood    She has only mild perianal pain and no drainage now.  She is compliant with Humira.  Denies extra-intestinal manifestations of IBD.     Abdominal pain (0-10):  Mild crampy abdominal pain, anal pain with BMs  BM a day: 2-3x/day - worse past 1 week  Consistency: soft, loose  % of stools have blood: 0%  Nocturnal BM: rare  Urgency:  no  Weight change over last 6 mo: stable  Smoking:  no  NSAIDS: avoid    Review of Systems:   Review of systems positive for: negative except as above.   Otherwise, the balance of 10 systems is negative.          IBD HISTORY:     Brief IBD Disease Course:    - 45 - dx with Crohn's dz ~age 14, sx of diarrhea, abd pain, rectal pain and bloody stools.  Treated with sulfasalazine, Asacol, Pentasa without benefit.  Got courses of steroids and antibiotics on and off.  Tried on but had nausea.   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    - 2004 - Remicade x 9 months. Stopped in 2005 due to 2ndary loss of response.   - 2005 - developed a pyoderma lesion.   - 2010 - Re-established care with Dr. Marcella Dubs.  Concern for internal fistulizing disease.  Was on Cimzia. Had surgery for perianal disease.   - 2011 - Started on Humira.   - 2012 - active perianal disease with a large abscess.  Required EUA, drainage, setons ([place]).   - 2013 - April, dx with pelvic floor dyssynergia and anal sphincter weakness, underwent biofeedback x 2 courses ([place] and [place]).   - 2014 - on - doing well on Humira and long term Cipro.  Having symptoms which have been thought to be related to intermittent IBS symptoms.     Endoscopy:      - Colonoscopy 06/17/2003:  patch inflammation and ulcers throughout the colon.    - Colonoscopy 01/2009:  Active crohn's at IC valve. Pseudopolyps.  Retraction of IC valve. ?Rectosigmoid fistula tract.  Otherwise normal colon.   - Colonoscopy 10/08/2009:  Perianal fistula, pseudopolyps.  Few aphthae in TI.   - Colonoscopy 07/29/2010:  Poor colon prep, pseudopolyps, stricture at IC valve.  Few ulcers at IC valve. Rectal stricture. Ileal crohn's disease.   - Colonoscopy 05/05/2011:  Pseduopolyps throughout.  4mm cecal polyp. Large fistula in sigmoid colon. Normal TI.   - Colonoscopy 09/06/2012:  Healed scars from prior fistulotomies on perianal exam. Scar in ascending colon. No active Crohn's.   - Colonoscopy 10/24/13:  Pseudopolyps in rectosigmoid, otherwise normal colon and TI.   - Colonoscopy 10/01/14:  Pseudopolyps in rectum, transverse and ascending colon.  Otherwise normal colon.  Granular TI.  Fixed and open IC valve (not stenotic).   - Colonoscopy 08/12/16:  Mild anal canal stenosis on exam, otherwise normal colon and ileum.     PATH = no dysplasia    Imaging:    - CT A/P 10/24/2013: mild colonic wall thickening at splenic/descending colon, no other bowel inflammation or stranding.  *Pachy groundglass opacity in left lung base.     Prior IBD medications (type, dose, duration, response):  x 5-ASAs - Sulfasalazine, Asacol, Pentasa  x Oral corticosteroids - Prednisone  ? Intravenous corticosteroids  x Antibiotics - Flagyl - nausea.  Cipro - tolerates well and it helps.   x Thiopurines - - nausea.   x Methotrexate - 2005.   x Anti-TNF therapies - Remicade 2004 - 2005 (~6 doses).  Had gradual loss of response so stopped. Tried on Remicade again in 2009 with some effect, increased to 10mg /kg q4 weeks. Cimzia - started 09/2008.  Humira 2014 - dose increased to 80mg  q2 weeks.  04/2016 - humira level 5.6, Ab negative.   ? Cyclosporine  ? Clinical trial medication  ? Other (Please specify):    IBD health maintenance:  Influenza vaccine: 2012, 117/17  Pneumonia vaccine: Prevnar spring 2017 by patient report (at PCP office)  Hepatitis B:   TB testing:   Chickenpox/Shingles history:   Bone denistometry:   Derm appointment:  Last small bowel imaging:   Last colonoscopy: 10/01/2014  PAP smear:     Extraintestinal manifestations:   -joint pains affecting: n  -eye: n  -skin: n  -oral ulcers :  n  -blood clots: n  -PSC: n  -other: n          Past Medical History:   Past medical history:   Past Medical History:   Diagnosis Date   ??? Acid reflux    ??? Arthritis    ??? Crohn disease (CMS-HCC)    ??? Fibromyalgia    ??? UTI (urinary tract infection)      Past surgical history:   Past Surgical History:   Procedure Laterality Date   ??? FRACTURE SURGERY      left wrist   ??? GASTROCUTANEOUS FISTULA CLOSURE     ??? ORTHOPEDIC SURGERY     ??? PR COLONOSCOPY FLX DX W/COLLJ SPEC WHEN PFRMD N/A 10/24/2013    Procedure: COLONOSCOPY, FLEXIBLE, PROXIMAL TO SPLENIC FLEXURE; DIAGNOSTIC, W/WO COLLECTION SPECIMEN BY BRUSH OR WASH; Surgeon: Theadore Nan, MD;  [facility];  Service: Gastroenterology   ??? PR COLONOSCOPY W/BIOPSY SINGLE/MULTIPLE N/A 10/01/2014    Procedure: COLONOSCOPY, FLEXIBLE, PROXIMAL TO SPLENIC FLEXURE; WITH BIOPSY, SINGLE OR MULTIPLE;  Surgeon: Orlan Leavens  Erma Heritage, MD;  Location: [facility] PROC[facility];  Service: Gastroenterology   ??? PR COLONOSCOPY W/BIOPSY SINGLE/MULTIPLE N/A 08/12/2016    Procedure: COLONOSCOPY, FLEXIBLE, PROXIMAL TO SPLENIC FLEXURE; WITH BIOPSY, SINGLE OR MULTIPLE;  Surgeon: Zetta Bills, MD;  Location: [facility];  Service: Gastroenterology   ??? PR SURG DIAGNOSTIC EXAM, ANORECTAL N/A 12/23/2016    Procedure: ANORECTAL EXAM, SURGICAL, REQUIRING ANESTHESIA (GENERAL, SPINAL, OR EPIDURAL), DIAGNOSTIC;  Surgeon: Mickle Asper, MD;  Location: MAIN OR [place];  Service: Gastrointestinal   ??? PR UPPER GI ENDOSCOPY,DIAGNOSIS N/A 08/12/2016    Procedure: UGI ENDO, INCLUDE ESOPHAGUS, STOMACH, & DUODENUM &/OR JEJUNUM; DX W/WO COLLECTION SPECIMN, BY BRUSH OR WASH;  Surgeon: Zetta Bills, MD;  [facility]: [facility];  Service: Gastroenterology   ??? TUBAL LIGATION       Family history:   Family History   Problem Relation Age of Onset   ??? Cancer Sister    ??? Cancer Paternal Grandmother    ??? Colorectal Cancer Neg Hx      Social history:   Social History     Social History   ??? Marital status: Single     Spouse name: N/A   ??? Number of children: N/A   ??? Years of education: N/A     Social History Main Topics   ??? Smoking status: Never Smoker   ??? Smokeless tobacco: Never Used   ??? Alcohol use 0.0 oz/week      Comment: socially    ??? Drug use: No   ??? Sexual activity: Not Asked     Other Topics Concern   ??? None     Social History Narrative   ??? None             Allergies:     Allergies   Allergen Reactions   ??? Oxycodone (Bulk) Nausea And Vomiting     Patient tolerates acetaminophen well             Medications:     Current Outpatient Prescriptions   Medication Sig Dispense Refill   ??? acetaminophen (TYLENOL) 500 MG tablet Take 1,000 mg by mouth every four (4) hours as needed for pain.     ??? ciprofloxacin HCl (CIPRO) 500 MG tablet TAKE 1 TABLET BY MOUTH 2 TIMES A DAY 60 tablet 2   ??? dicyclomine (BENTYL) 10 mg capsule TAKE 1 CAPSULE (10 MG TOTAL) BY MOUTH THREE (3) TIMES A DAY AS NEEDED. 270 capsule 1   ??? fluticasone (FLONASE) 50 mcg/actuation nasal spray 1 SPRAY BY EACH NARE ROUTE DAILY. AS DIRECTED (Patient taking differently: 1 spray by Each Nare route daily as needed. As directed) 16 mL 2   ??? HUMIRA PEN 40 MG/0.8 ML SUBCUTANEOUS INJECT ONE PEN (40 MG) SUBCUTANEOUSLY EVERY WEEK. REFRIGERATE. 12 each 1   ??? loratadine (CLARITIN) 10 mg tablet Take 10 mg by mouth once as needed.      ??? ondansetron (ZOFRAN-ODT) 8 MG disintegrating tablet Take 8 mg by mouth every eight (8) hours as needed.      ??? cholestyramine-aspartame (CHOLESTYRAMINE LIGHT) 4 gram PwPk Take 1 packet by mouth Two (2) times a day. (Patient not taking: Reported on 01/20/2017) 180 packet 3   ??? MULTIVIT WITH CALCIUM,IRON,MIN (WOMEN'S DAILY MULTIVITAMIN ORAL) Take 1 tablet by mouth once daily.     ??? oxyCODONE (ROXICODONE) 5 MG immediate release tablet Take 1 tablet (5 mg total) by mouth every six (6) hours as needed for pain. (Patient not taking: Reported  on 01/20/2017) 10 tablet 0     No current facility-administered medications for this visit.              Physical Exam:   BP 116/72  - Pulse 94  - Temp 35.6 ??C (96.1 ??F) (Oral)  - Wt (!) 104.2 kg (229 lb 11.2 oz)  - BMI 39.41 kg/m??     GEN:  Obese, well appearing female in no apparent distress, appears comfortable on exam  HEENT: PEERL, OP clear with no erythema, lesions, exudate, mucous membranes moist  NECK: Supple, no lymphadenopathy  PULM: CTAB, no wheezes, rales, or rhonchi  CV: S1/S2, RRR, no murmurs  GI: Soft, mild tenderness in lower abdomen only, nondistended, normoactive bowel sounds, no rebound/guarding, no appreciable organomegaly; Carnett's sign equivocal. Extremities: no cyanosis, clubbing or edema, normal gait  Psych: affect appropriate, A&O x3  RECTAL:  Small scarred area in the left peri-anal area, no drainage, well healing. No fissures, fluctuant area, abscess, mass or fistula tract noted.  Digital exam with mild anal stricture. Digital exam of this area does reproduce patient's burning pain  (Rectal exam performed with female nurse chaperone: Neta Mends, RN).           Labs, Data & Indices:     Lab Review:   Lab Results   Component Value Date    WBC 5.4 01/20/2017    WBC 6.5 04/29/2016    RBC 4.72 01/20/2017    RBC 4.50 04/29/2016    HGB 13.4 01/20/2017    HGB 13.2 04/29/2016     Lab Results   Component Value Date    AST 19 01/20/2017    AST 13 04/29/2016    ALT 20 01/20/2017    ALT 9 04/29/2016    BUN 13 01/20/2017    BUN 11 04/29/2016    CREATININE 0.75 01/20/2017    CREATININE 0.80 04/29/2016    CO2 27.0 01/20/2017    CO2 21 04/29/2016    ALBUMIN 4.2 01/20/2017    ALBUMIN 4.3 08/19/2014    CALCIUM 9.4 01/20/2017    CALCIUM 9.4 04/29/2016     Lab Results   Component Value Date    TSH 2.810 04/29/2016      ...........................................................................................................................................Marland Kitchen          Assessment & Recommendations:   Disease state:  Ileocolonic crohn's with perianal disease and anorectal stricture    Jasmine Mitchell is a 46 y.o. female with a long standing hx of complicated stricturing ileocolonic Crohn's disease as well as perianal disease in the past.  She has seen Dr. Elenore Rota recently and required drainage of a perianal fluid collection.  She also has an anal stricture on exam today and I think this is causing symptoms for her - we will try dilation for this.  She has been on multiple anti-TNF therapies in the past and is currently maintained on Humira.  She also has an overlay of functional GI symptoms that are due to Irritable bowel syndrome.      PLAN:  1.  Continue weekly Humira  2. Up date labs today - including CBC, CMP, B12 and iron as she is at risk for nutritional deficiencies with her Crohn's  3.  Sigmoidoscopy fall 2018 - plan to do anal stricture dilation  4.  Referral to [facility] Citron in Nutrition regarding healthy diet with Crohn's and IBS  5.  Psych - reports stress, anxiety, depression.  Discussed briefly today.  She would like to see Clayborn Heron in  psychology.   6.  Irritable bowel - increase bentyl to 4 tabs daily  7.  Follow up 6 months    --------------------------------------------  Author: Zetta Bills 01/20/2017 11:58 AM    Zetta Bills, MD  Assistant Professor of Medicine  Division of Gastroenterology & Hepatology  [place] [facility]

## 2017-01-20 NOTE — Unmapped (Addendum)
INSTRUCTIONS:     1.  Continue Humira.  Labs today.   2.  Sigmoidoscopy fall 2018 - plan to stretch the anal stricture  3.  Continue bentyl - would increase to at least 4 tablets per day for now given increased cramping  4.  Referral to [facility] Psychiat[facility] Lynden Ang Forneris)  5.  Referral to [facility]ron)  6.  Follow up 6 months.  If any trouble or symptoms, do not hesitate to call.     Zetta Bills, MD  Assistant Professor of Medicine  Division of Gastroenterology & Hepatology  [place] [facility] [place] [facility] [place]    Clinical Nurse Contact:  Delorse Lek, RN  Ph#  [phone]  Fax#  [phone]    Office Number:   Ph# [phone]  Fax # [phone]    For clinic appointments, please call, [phone], option 1.  For questions regarding radiology appointments, or to schedule, [phone].  For questions regarding scheduling GI procedures (e.g,. Colonoscopy), please call, [phone], option 2.    For educational material and resources:  [email or web address]    ============================================

## 2017-02-03 NOTE — Unmapped (Signed)
Specialty Pharmacy Refill Coordination Note     Jasmine Mitchell is a 46 y.o. female contacted today regarding refills of her specialty medication(s).    Reviewed and verified with patient:      Specialty medication(s) and dose(s) confirmed: yes  Changes to medications: no  Changes to insurance: no    Medication Adherence    Medication Assistance Program  Refill Coordination  Has the Patient's Contact Information Changed:  No  Is the Shipping Address Different:  No  Shipping Information  Delivery Scheduled:  Yes  Delivery Date:  02/10/17  Medications to be Shipped:  HUMIRA          Follow-up: 3 week(s)     Mitzi Davenport  Specialty Pharmacy Technician

## 2017-05-24 NOTE — Progress Notes (Deleted)
HPI:  Katrina Grant is a pleasant 46 yo whom come in to the office rare, has PMH prediabetes, mild dyslipidemia, morbid obesity, GAD, Depression, ADD, irr menstrual bleeding, Crohn's (managed at [facility]), fibromyalgia (sees rheumatologist), GERD and OA here for follow up. *** Due for pap, mammo, flu vaccine.  ROS: See pertinent positives and negatives per HPI.  Past Medical History:  Diagnosis Date  . ADD (attention deficit disorder)   . Cholesterol serum elevated    on medication in the past  . Crohn's disease of colon with fistula ([place])    sees Dr. Coralyn Mark, [facility]  . Depression   . Fibromyalgia - managed by Dr. Dossie Der, [facility] - takes Neurontin and Tramadol 11/20/2013  . Frequent headaches   . GERD (gastroesophageal reflux disease)   . Osteoarthritis    knee, spine  . Pneumonia   . Syncope     Past Surgical History:  Procedure Laterality Date  . anal fistula repair      X 4   . colonoscopy with dilation     X 2   . FRACTURE SURGERY    . TUBAL LIGATION      Family History  Problem Relation Age of Onset  . Hypertension Mother   . Diabetes Father   . Cancer Sister        cervical  . Mental illness Brother        drugs and alcohol abuse    Social History   Socioeconomic History  . Marital status: Single    Spouse name: Not on file  . Number of children: Not on file  . Years of education: Not on file  . Highest education level: Not on file  Social Needs  . Financial resource strain: Not on file  . Food insecurity - worry: Not on file  . Food insecurity - inability: Not on file  . Transportation needs - medical: Not on file  . Transportation needs - non-medical: Not on file  Occupational History  . Not on file  Tobacco Use  . Smoking status: Never Smoker  . Smokeless tobacco: Never Used  Substance and Sexual Activity  . Alcohol use: No    Alcohol/week: 0.0 oz  . Drug use: No  . Sexual activity: Yes  Other Topics Concern  . Not on file  Social  History Narrative   Work or School: on disability for disability related to Crohn's and managed by Dr. Coralyn Mark; works part time      Home Situation: lives with boyfriend and son (59 yo) in 2015      Spiritual Beliefs: Christian      Lifestyle: regular exercise - walking 3x per week; diet is healthy              Current Outpatient Medications:  .  acetaminophen (TYLENOL) 500 MG tablet, Take 1,000 mg by mouth every 6 (six) hours as needed. For migraines and stomach upset , Disp: , Rfl:  .  Adalimumab (HUMIRA) 40 MG/0.8ML PSKT, Inject 40 mg into the skin once a week., Disp: , Rfl:  .  benzonatate (TESSALON PERLES) 100 MG capsule, Take 1 capsule (100 mg total) by mouth 3 (three) times daily as needed., Disp: 20 capsule, Rfl: 0 .  ciprofloxacin (CIPRO) 500 MG tablet, Take 500 mg by mouth 2 (two) times daily. For Crohn's Disease., Disp: , Rfl:  .  dicyclomine (BENTYL) 10 MG capsule, Take 10 mg by mouth 2 (two) times daily., Disp: , Rfl:  .  fluticasone (FLONASE) 50 MCG/ACT nasal spray, , Disp: , Rfl:  .  ondansetron (ZOFRAN ODT) 8 MG disintegrating tablet, Take 1 tablet (8 mg total) by mouth every 8 (eight) hours as needed for nausea or vomiting., Disp: 30 tablet, Rfl: 0  EXAM:  There were no vitals filed for this visit.  There is no height or weight on file to calculate BMI.  GENERAL: vitals reviewed and listed above, alert, oriented, appears well hydrated and in no acute distress  HEENT: atraumatic, conjunttiva clear, no obvious abnormalities on inspection of external nose and ears  NECK: no obvious masses on inspection  LUNGS: clear to auscultation bilaterally, no wheezes, rales or rhonchi, good air movement  CV: HRRR, no peripheral edema  MS: moves all extremities without noticeable abnormality *** PSYCH: pleasant and cooperative, no obvious depression or anxiety  ASSESSMENT AND PLAN:  Discussed the following assessment and plan:  No diagnosis found.  *** -Patient  advised to return or notify a doctor immediately if symptoms worsen or persist or new concerns arise.  There are no Patient Instructions on file for this visit.  Colin Benton R., DO

## 2017-05-25 ENCOUNTER — Ambulatory Visit: Payer: Self-pay | Admitting: Family Medicine

## 2017-05-26 NOTE — Unmapped (Signed)
[facility] Specialty Pharmacy Refill Coordination Note  Medication: Humira 40mg /0.42ml    Unable to reach patient to schedule shipment for medication being filled at [facility]. Left voicemail on phone.  As this is the 3rd unsuccessful attempt to reach the patient, no additional phone call attempts will be made at this time.      Phone numbers attempted: [phone]  Last scheduled delivery: 02/10/17    Please call the [facility] at [phone] (option 4) should you have any further questions.      Thanks,  [facility] Specialty Team

## 2017-06-27 NOTE — Unmapped (Signed)
Patient requested refill of Humira - hasn't taken medication since September due to being uninsured.  We have reached out to the GI clinic today to see if they would like her to resume her old dosing or if a new loading dose is appropriate.  Will contact patient once we hear back from the clinic.

## 2017-06-28 MED ORDER — ADALIMUMAB 80 MG/0.8 ML SUBCUTANEOUS PEN KIT: kit | 0 refills | 0 days | Status: AC

## 2017-06-28 MED ORDER — ADALIMUMAB 80 MG/0.8 ML SUBCUTANEOUS PEN KIT
PACK | SUBCUTANEOUS | 0 refills | 0.00000 days | Status: CP
Start: 2017-06-28 — End: 2018-02-28

## 2017-06-28 NOTE — Unmapped (Signed)
Note received from [facility] pharmacist, Maisie Fus Sinodis    This patient called today to ask about restarting her Humira. ??She had her last dose in September but has been uninsured since then and hasn't taken the medication due to high cost without insurance coverage. ??She has new coverage that started 06/20/17 and asked for a refill today - I told her I needed to touch base with the clinic to see if she needed a loading dose again since she's been off medication ~3-4 months -or- if she should just resume her once weekly dose. ??Please advise which is the route we need to go - and send a new script if a new loading dose is appropriate.     She also requested a new script for her Cipro to be sent to the [facility] as well so that we can ship all her medication together.     Thanks in advance for your help,     Maisie Fus     Discussed with Dr. Raphael Gibney, pt to re-load with Humira. Cipro not appropriate at this time. If pt feels she needs Cipro, she needs to call us to discuss. Did attempt to call pt, unable to get through. Will try again later. Message also sent with information to [employer]. Humira starter (80mg /0.60mL) pen prescription sent to [facility]

## 2017-06-28 NOTE — Unmapped (Signed)
Per test claim for Humira at the [facility], patient needs Medication Assistance Program for Prior Authorization.

## 2017-07-03 MED ORDER — ADALIMUMAB PEN CITRATE FREE 40 MG/0.4 ML: each | 10 refills | 0 days

## 2017-07-03 MED ORDER — ADALIMUMAB PEN CITRATE FREE 40 MG/0.4 ML
SUBCUTANEOUS | 10 refills | 0.00000 days | Status: CP
Start: 2017-07-03 — End: 2018-02-28

## 2017-07-03 NOTE — Unmapped (Signed)
Humira 40mg /0.67mL [facility] q7days sent to [facility] Shared services.

## 2017-07-03 NOTE — Unmapped (Signed)
PA approved for Humira pens (starter pack and maintenance dose) from 06/28/17 to 06/19/38 for $5 with copay assistance.

## 2017-07-03 NOTE — Unmapped (Signed)
-----   Message from Lupita Shutter, PharmD sent at 06/29/2017 11:20 AM EST -----  Regarding: RE: Humira Re-start?  Sounds great.  Can you send a new script for the following months for the 40mg /0.43ml pens?  Her insurance is recurring a PA for the new loading dose - which is already in process.  When I call her once that is resolved, I will discuss the Cipro for her.  Thanks!      ----- Message -----  From: Lossie Faes, RN  Sent: 06/28/2017  11:23 AM  To: Lupita Shutter, PharmD  Subject: RE: Humira Re-start?                             Hi,    Yes, Dr. Raphael Gibney would like her to re-load with Humira since it has been so long. I have sent the prescription. She does not need a Cipro refill. If she feels she does, she needs to call me to discuss. I did try to call her this morning, but my phone call would not go through. If you get in touch with her, please ask her to call me [phone]) if she is having problems that she feels she needs Cipro.     Thanks  Sherrilyn Rist   ----- Message -----  From: Lupita Shutter, PharmD  Sent: 06/27/2017  12:48 PM  To: Lossie Faes, RN  Subject: Humira Re-start?                                 Hi Sherrilyn Rist -    This patient called today to ask about restarting her Humira.  She had her last dose in September but has been uninsured since then and hasn't taken the medication due to high cost without insurance coverage.  She has new coverage that started 06/20/17 and asked for a refill today - I told her I needed to touch base with the clinic to see if she needed a loading dose again since she's been off medication ~3-4 months -or- if she should just resume her once weekly dose.  Please advise which is the route we need to go - and send a new script if a new loading dose is appropriate.    She also requested a new script for her Cipro to be sent to the [facility] as well so that we can ship all her medication together.    Thanks in advance for your help,    Harrell Gave. Sinodis, PharmD Clinical Pharmacist - [facility]  [address], [place], [place] [address]  Phone: [phone] - Fax. [phone]

## 2017-07-05 NOTE — Unmapped (Signed)
[facility]   Patient Onboarding/Medication Counseling PATIENT HAS BEEN ON PREVIOUSLY AND DECLINED COUNSELING AND ONBOARDING    Jasmine Mitchell is a 47 y.o. female with Chrohn's who I am counseling today on initiation of therapy.    Medication: Humira    Verified patient's date of birth / HIPAA.      Education Provided: ??    Dose/Administration discussed: starter pack first then 40mg  every 7 days. This medication should be taken  without regard to food.     Storage requirements: this medicine should be stored in the refrigerator.     Side effects discussed: DECLINED    Patient will receive a Lexi-Comp drug information handout with shipment.    Handling precautions reviewed:  Patient was counseled on the hazards surrounding oral chemotherapy and will minimize contact/exposure to the medicine.    Drug Interactions: other medications reviewed and up to date in Epic.  No drug interactions identified.    Comorbidities/Allergies: reviewed and up to date in Epic.    Verified therapy is appropriate and should continue      Delivery Information    Anticipated copay of $5.00 reviewed with patient. Verified delivery address in FSI and reviewed medication storage requirement.    Scheduled delivery date: 07/06/17    Explained that we ship using UPS and this shipment will not require a signature.      Explained the services we provide at [facility] and that each month we would call to set up refills.  Stressed importance of returning phone calls so that we could ensure they receive their medications in time each month.  Informed patient that we should be setting up refills 7-10 days prior to when they will run out of medication.  Informed patient that welcome packet will be sent.      Patient verbalized understanding of the above information as well as how to contact the pharmacy at [phone] option 4 with any questions/concerns.        Patient Specific Needs      ? Patient has no physical or cognitive barriers.    ? Patient prefers to have medications discussed with  Patient     ? Patient is able to read and understand education materials at a high school level or above.        Rollen Sox  [facility] Specialty Pharmacist

## 2017-07-25 ENCOUNTER — Encounter: Admit: 2017-07-25 | Discharge: 2017-07-26 | Payer: PRIVATE HEALTH INSURANCE

## 2017-07-25 DIAGNOSIS — K580 Irritable bowel syndrome with diarrhea: Secondary | ICD-10-CM | POA: Diagnosis not present

## 2017-07-25 DIAGNOSIS — M797 Fibromyalgia: Secondary | ICD-10-CM | POA: Diagnosis not present

## 2017-07-25 DIAGNOSIS — Z79899 Other long term (current) drug therapy: Secondary | ICD-10-CM | POA: Diagnosis not present

## 2017-07-25 DIAGNOSIS — K5080 Crohn's disease of both small and large intestine without complications: Secondary | ICD-10-CM | POA: Diagnosis not present

## 2017-07-25 DIAGNOSIS — M2550 Pain in unspecified joint: Secondary | ICD-10-CM | POA: Diagnosis not present

## 2017-07-25 DIAGNOSIS — Z885 Allergy status to narcotic agent status: Secondary | ICD-10-CM | POA: Diagnosis not present

## 2017-07-25 LAB — COMPREHENSIVE METABOLIC PANEL
ALBUMIN: 4.1 g/dL (ref 3.5–5.0)
ALKALINE PHOSPHATASE: 74 U/L (ref 38–126)
ALT (SGPT): 20 U/L (ref 15–48)
ANION GAP: 9 mmol/L (ref 9–15)
AST (SGOT): 18 U/L (ref 14–38)
BILIRUBIN TOTAL: 0.5 mg/dL (ref 0.0–1.2)
CALCIUM: 9.6 mg/dL (ref 8.5–10.2)
CHLORIDE: 103 mmol/L (ref 98–107)
CO2: 28 mmol/L (ref 22.0–30.0)
CREATININE: 0.63 mg/dL (ref 0.60–1.00)
EGFR MDRD AF AMER: >=60 mL/min/{1.73_m2} (ref >=60)
EGFR MDRD NON AF AMER: >=60 mL/min/{1.73_m2} (ref >=60)
GLUCOSE RANDOM: 70 mg/dL (ref 65–179)
POTASSIUM: 4.3 mmol/L (ref 3.5–5.0)
PROTEIN TOTAL: 7.2 g/dL (ref 6.5–8.3)
SODIUM: 140 mmol/L (ref 135–145)

## 2017-07-25 LAB — CBC
HEMATOCRIT: 41.5 % (ref 36.0–46.0)
HEMOGLOBIN: 13.4 g/dL (ref 12.0–16.0)
MEAN CORPUSCULAR HEMOGLOBIN CONC: 32.4 g/dL (ref 31.0–37.0)
MEAN CORPUSCULAR HEMOGLOBIN: 29.2 pg (ref 26.0–34.0)
MEAN CORPUSCULAR VOLUME: 90.3 fL (ref 80.0–100.0)
MEAN PLATELET VOLUME: 8.7 fL (ref 7.0–10.0)
PLATELET COUNT: 262 10*9/L (ref 150–440)
RED CELL DISTRIBUTION WIDTH: 14.5 % (ref 12.0–15.0)
WBC ADJUSTED: 6.3 10*9/L (ref 4.5–11.0)

## 2017-07-25 LAB — FERRITIN
FERRITIN: 9.4 ng/mL (ref 3.0–151.0)
Ferritin:MCnc:Pt:Ser/Plas:Qn:: 9.4

## 2017-07-25 LAB — IRON SATURATION (CALC): Iron saturation:MFr:Pt:Ser/Plas:Qn:: 18

## 2017-07-25 LAB — ERYTHROCYTE SEDIMENTATION RATE: Lab: 31 — ABNORMAL HIGH

## 2017-07-25 LAB — BILIRUBIN TOTAL: Bilirubin:MCnc:Pt:Ser/Plas:Qn:: 0.5

## 2017-07-25 LAB — C-REACTIVE PROTEIN: C reactive protein:MCnc:Pt:Ser/Plas:Qn:: 8.1

## 2017-07-25 LAB — RED CELL DISTRIBUTION WIDTH: Lab: 14.5

## 2017-07-25 LAB — IRON & TIBC: TOTAL IRON BINDING CAPACITY (CALC): 406.9 mg/dL (ref 252.0–479.0)

## 2017-07-25 LAB — VITAMIN B-12: Cobalamins:MCnc:Pt:Ser/Plas:Qn:: 352

## 2017-07-25 MED ORDER — DICYCLOMINE 10 MG CAPSULE
ORAL_CAPSULE | Freq: Three times a day (TID) | ORAL | 6 refills | 0.00000 days | Status: CP
Start: 2017-07-25 — End: 2018-04-17

## 2017-07-25 MED ORDER — DESIPRAMINE 25 MG TABLET
ORAL_TABLET | Freq: Every day | ORAL | 2 refills | 0.00000 days | Status: CP
Start: 2017-07-25 — End: 2017-11-27

## 2017-07-25 NOTE — Unmapped (Signed)
Patient given flu vaccine. Flu vaccine VIS given to patient. Patient tolerated procedure well.

## 2017-07-25 NOTE — Unmapped (Signed)
INSTRUCTIONS:     1.  Continue Humira 1 pen weekly  2.  Up date lab work today  3.  Flu shot today  4.  Start Desipramine - start 25mg  nightly for 2 weeks. Then, try increasing to 50mg  nightly.   5.  Continue Dicyclomine 10mg  three times daily. Ok to stop Cipro.   6.  Follow up in ~4 months.  We can discuss alternative medications at that time.   If any trouble or symptoms, do not hesitate to call.     Zetta Bills, MD  Assistant Professor of Medicine  Division of Gastroenterology & Hepatology  [facility]    Clinical Nurse Contact:  Neta Mends, RN  Ph#  [phone]  Fax#  [phone]    Office Number:   Ph# [phone]  Fax # [phone]    For clinic appointments, please call, [phone], option 1.  For questions regarding radiology appointments, or to schedule, [phone].  For questions regarding scheduling GI procedures (e.g,. Colonoscopy), please call, [phone], option 2.    For educational material and resources:  [email or web address]    ============================================

## 2017-07-25 NOTE — Unmapped (Signed)
Jasmine Mitchell   FOLLOWUP NOTE - INFLAMMATORY BOWEL DISEASE  07/25/2017    Demographics:  Jasmine Mitchell is a 47 y.o. year old female    Diagnosis:  Crohn's Disease  Disease onset (yr): 79  Age at onset:   < 22 yr old (A1)  Location:  Ileocolonic (L3) (mostly colonic)  Behavior:  Perianal (P), Stricturing (B2)  Current Tight Control Scenario:   Maintenance = Biologic          HPI / NOTE :     Today, I saw Jasmine Mitchell for followup in the [facility].    HPI:    Interval Events:   1.  Lost insurance 02/2017 and has been off Humira since then.  Worsening symptoms.  Contacted [place] Jan 2019.  Got new insurance as of Jan 2019 and we have re-started humira with repeat loading doses.   2.  Interestingly, she was off Humira for several months and she feels that much of her low back and multiple joint pains completely cleared up while she was off Humira.  Now that she is back in the loading doses she feels that the joint pains are returning.  She is interested in remaining on Humira for now if it controls her Crohn's disease but also notes that the joint symptoms are quite bothersome.  3.  She also has mild ongoing GI symptoms which she attributes largely to irritable bowel syndrome.  She has considerable loose stools and diarrhea which are very bothersome to her.  4.  Planning on getting married May 2019.     No extraintestinal manifestations of IBD.     Abdominal pain (0-10):  crampy pain, worse with BMs  BM a day: 4-5x/day   Consistency: loose  % of stools have blood: 0%  Nocturnal BM: rare  Urgency:  Yes ++  Weight change over last 6 mo: stable  Smoking:  no  NSAIDS: avoids    Review of Systems:   Review of systems positive for: negative except as above.   Otherwise, the balance of 10 systems is negative.          IBD HISTORY:     Brief IBD Disease Course:    - 65 - dx with Crohn's dz ~age 78, sx of diarrhea, abd pain, rectal pain and bloody stools.  Treated with sulfasalazine, Asacol, Pentasa without benefit.  Got courses of steroids and antibiotics on and off.  Tried on but had nausea.   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    - 2004 - Remicade x 9 months. Stopped in 2005 due to 2ndary loss of response.   - 2005 - developed a pyoderma lesion.   - 2010 - Re-established care with Dr. Marcella Dubs.  Concern for internal fistulizing disease.  Was on Cimzia. Had surgery for perianal disease.   - 2011 - Started on Humira.   - 2012 - active perianal disease with a large abscess.  Required EUA, drainage, setons ([place]).   - 2013 - April, dx with pelvic floor dyssynergia and anal sphincter weakness, underwent biofeedback x 2 courses ([place] and [place]).   - 2014 - on - doing well on Humira and long term Cipro.  Having symptoms which have been thought to be related to intermittent IBS symptoms.     Endoscopy:      - Colonoscopy 06/17/2003: patch inflammation and ulcers throughout the colon.    - Colonoscopy 01/2009:  Active crohn's at IC valve.  Pseudopolyps.  Retraction of IC valve. ?Rectosigmoid fistula tract.  Otherwise normal colon.   - Colonoscopy 10/08/2009:  Perianal fistula, pseudopolyps.  Few aphthae in TI.   - Colonoscopy 07/29/2010:  Poor colon prep, pseudopolyps, stricture at IC valve.  Few ulcers at IC valve. Rectal stricture. Ileal crohn's disease.   - Colonoscopy 05/05/2011:  Pseduopolyps throughout.  4mm cecal polyp. Large fistula in sigmoid colon. Normal TI.   - Colonoscopy 09/06/2012:  Healed scars from prior fistulotomies on perianal exam. Scar in ascending colon. No active Crohn's.   - Colonoscopy 10/24/13:  Pseudopolyps in rectosigmoid, otherwise normal colon and TI.   - Colonoscopy 10/01/14:  Pseudopolyps in rectum, transverse and ascending colon.  Otherwise normal colon.  Granular TI.  Fixed and open IC valve (not stenotic).   - Colonoscopy 08/12/16:  Mild anal canal stenosis on exam, otherwise normal colon and ileum.     PATH = no dysplasia    Imaging:    - CT A/P 10/24/2013: mild colonic wall thickening at splenic/descending colon, no other bowel inflammation or stranding.  *Pachy groundglass opacity in left lung base.     Prior IBD medications (type, dose, duration, response):  x 5-ASAs - Sulfasalazine, Asacol, Pentasa  x Oral corticosteroids - Prednisone  ? Intravenous corticosteroids  x Antibiotics - Flagyl - nausea.  Cipro - tolerates well and it helps.   x Thiopurines - - nausea.   x Methotrexate - 2005.   x Anti-TNF therapies - Remicade 2004 - 2005 (~6 doses).  Had gradual loss of response so stopped. Tried on Remicade again in 2009 with some effect, increased to 10mg /kg q4 weeks. Cimzia - started 09/2008.  Humira 2014 - dose increased to 80mg  q2 weeks.  04/2016 - humira level 5.6, Ab negative.   ? Cyclosporine  ? Clinical trial medication  ? Other (Please specify):    IBD health maintenance:  Influenza vaccine: 2012, 117/17  Pneumonia vaccine: Prevnar spring 2017 by patient report (at PCP office)  Hepatitis B:   TB testing:   Chickenpox/Shingles history:   Bone denistometry:   Derm appointment:  Last small bowel imaging:   Last colonoscopy: 10/01/2014  PAP smear:     Extraintestinal manifestations:   -joint pains affecting: n  -eye: n  -skin: n  -oral ulcers :  n  -blood clots: n  -PSC: n  -other: n          Past Medical History:   Past medical history:   Past Medical History:   Diagnosis Date   ??? Acid reflux    ??? Arthritis    ??? Crohn disease (CMS-HCC)    ??? Fibromyalgia    ??? UTI (urinary tract infection)      Past surgical history:   Past Surgical History:   Procedure Laterality Date   ??? FRACTURE SURGERY      left wrist   ??? GASTROCUTANEOUS FISTULA CLOSURE     ??? ORTHOPEDIC SURGERY     ??? PR COLONOSCOPY FLX DX W/COLLJ SPEC WHEN PFRMD N/A 10/24/2013    Procedure: COLONOSCOPY, FLEXIBLE, PROXIMAL TO SPLENIC FLEXURE; DIAGNOSTIC, W/WO COLLECTION SPECIMEN BY BRUSH OR WASH;  Surgeon: Theadore Nan, MD;  [facility];  Service: Gastroenterology   ??? PR COLONOSCOPY W/BIOPSY SINGLE/MULTIPLE N/A 10/01/2014    Procedure: COLONOSCOPY, FLEXIBLE, PROXIMAL TO SPLENIC FLEXURE; WITH BIOPSY, SINGLE OR MULTIPLE;  Surgeon: Teodoro Spray, MD;  Location: [facility] PROCEDURES [facility];  Service: Gastroenterology   ??? PR COLONOSCOPY W/BIOPSY SINGLE/MULTIPLE  N/A 08/12/2016    Procedure: COLONOSCOPY, FLEXIBLE, PROXIMAL TO SPLENIC FLEXURE; WITH BIOPSY, SINGLE OR MULTIPLE;  Surgeon: Zetta Bills, MD;  Location: [facility] PROCEDURES [facility];  Service: Gastroenterology   ??? PR SURG DIAGNOSTIC EXAM, ANORECTAL N/A 12/23/2016    Procedure: ANORECTAL EXAM, SURGICAL, REQUIRING ANESTHESIA (GENERAL, SPINAL, OR EPIDURAL), DIAGNOSTIC;  Surgeon: Mickle Asper, MD;  Location: MAIN OR [place];  Service: Gastrointestinal   ??? PR UPPER GI ENDOSCOPY,DIAGNOSIS N/A 08/12/2016    Procedure: UGI ENDO, INCLUDE ESOPHAGUS, STOMACH, & DUODENUM &/OR JEJUNUM; DX W/WO COLLECTION SPECIMN, BY BRUSH OR WASH;  Surgeon: Zetta Bills, MD;  [facility]: [facility];  Service: Gastroenterology   ??? TUBAL LIGATION       Family history:   Family History   Problem Relation Age of Onset   ??? Cancer Sister    ??? Cancer Paternal Grandmother    ??? Colorectal Cancer Neg Hx      Social history:   Social History     Social History   ??? Marital status: Single     Spouse name: N/A   ??? Number of children: N/A   ??? Years of education: N/A     Social History Main Topics   ??? Smoking status: Never Smoker   ??? Smokeless tobacco: Never Used   ??? Alcohol use 0.0 oz/week      Comment: socially    ??? Drug use: No   ??? Sexual activity: Not Asked     Other Topics Concern   ??? None     Social History Narrative   ??? None             Allergies:     Allergies   Allergen Reactions   ??? Oxycodone (Bulk) Nausea And Vomiting     Patient tolerates acetaminophen well             Medications:     Current Outpatient Prescriptions   Medication Sig Dispense Refill   ??? acetaminophen (TYLENOL) 500 MG tablet Take 1,000 mg by mouth every four (4) hours as needed for pain.     ??? ADALIMUMAB PEN CITRATE FREE 40 MG/0.4 ML Inject 0.4 mL (40 mg total) under the skin every seven (7) days. 4 each 11   ??? cholestyramine-aspartame (CHOLESTYRAMINE LIGHT) 4 gram PwPk Take 1 packet by mouth Two (2) times a day. 180 packet 3   ??? dicyclomine (BENTYL) 10 mg capsule Take 1 capsule (10 mg total) by mouth three (3) times a day. 90 capsule 6   ??? fluticasone (FLONASE) 50 mcg/actuation nasal spray 1 SPRAY BY EACH NARE ROUTE DAILY. AS DIRECTED (Patient taking differently: 1 spray by Each Nare route daily as needed. As directed) 16 mL 2   ??? loratadine (CLARITIN) 10 mg tablet Take 10 mg by mouth once as needed.      ??? ondansetron (ZOFRAN-ODT) 8 MG disintegrating tablet Take 8 mg by mouth every eight (8) hours as needed.      ??? adalimumab (HUMIRA PEN CROHN'S-[facility]-HS START) 80 mg/0.8 mL PnKt 160 mg (2 pens) day 1, then 80 mg (1 pen) on day 15 then 40 mg every week 1 kit 0   ??? ciprofloxacin HCl (CIPRO) 500 MG tablet TAKE 1 TABLET BY MOUTH 2 TIMES A DAY (Patient not taking: Reported on 07/25/2017) 60 tablet 2   ??? desipramine (NORPRAMIN) 25 MG tablet Take 2 tablets (50 mg total) by mouth daily. 60 tablet 2   ??? MULTIVIT WITH CALCIUM,IRON,MIN (WOMEN'S DAILY MULTIVITAMIN ORAL)  Take 1 tablet by mouth once daily.     ??? oxyCODONE (ROXICODONE) 5 MG immediate release tablet Take 1 tablet (5 mg total) by mouth every six (6) hours as needed for pain. (Patient not taking: Reported on 01/20/2017) 10 tablet 0     No current facility-administered medications for this visit.              Physical Exam:   BP 121/80  - Pulse 66  - Temp 36.7 ??C (98.1 ??F) (Temporal)  - Wt (!) 106.6 kg (235 lb)  - BMI 40.32 kg/m??     GEN:  Obese, well appearing female in no apparent distress, appears comfortable on exam  HEENT: PEERL, OP clear with no erythema, lesions, exudate, mucous membranes moist  NECK: Supple, no lymphadenopathy  PULM: CTAB, no wheezes, rales, or rhonchi  CV: S1/S2, RRR, no murmurs  GI: Soft, nontender, nondistended, normoactive bowel sounds, no rebound/guarding, no appreciable organomegaly; Carnett's sign equivocal.   Extremities: no cyanosis, clubbing or edema, normal gait  Psych: affect appropriate, A&O x3          Labs, Data & Indices:     Lab Review:   Lab Results   Component Value Date    WBC 6.3 07/25/2017    WBC 6.5 04/29/2016    RBC 4.60 07/25/2017    RBC 4.50 04/29/2016    HGB 13.4 07/25/2017    HGB 13.2 04/29/2016     Lab Results   Component Value Date    AST 18 07/25/2017    AST 13 04/29/2016    ALT 20 07/25/2017    ALT 9 04/29/2016    BUN 13 07/25/2017    BUN 11 04/29/2016    CREATININE 0.63 07/25/2017    CREATININE 0.80 04/29/2016    CO2 28.0 07/25/2017    CO2 21 04/29/2016    ALBUMIN 4.1 07/25/2017    ALBUMIN 4.3 08/19/2014    CALCIUM 9.6 07/25/2017    CALCIUM 9.4 04/29/2016     Lab Results   Component Value Date    TSH 2.810 04/29/2016      ............................................................................................................................................  1. Crohn's disease of both small and large intestine without complication (CMS-HCC)  CBC    C-reactive protein    Sedimentation Rate    Ferritin    Vitamin B12 Level    Thiopurine Methyltransferase (TPMT)    Iron & TIBC    Comprehensive Metabolic Panel    CBC    C-reactive protein    Sedimentation Rate    Ferritin    Vitamin B12 Level    Thiopurine Methyltransferase (TPMT)    Iron & TIBC    Comprehensive Metabolic Panel   2. Irritable bowel syndrome with diarrhea     3. Arthralgia, unspecified joint     4. Fibromyalgia             Assessment & Recommendations:   Disease state:  Ileocolonic crohn's with perianal disease and anorectal stricture    Jasmine Mitchell is a 48 y.o. female with a long standing hx of complicated stricturing ileocolonic Crohn's disease as well as perianal disease in the past.  She has seen Dr. Elenore Rota (summer 2018) and required drainage of a perianal fluid collection.  She was doing reasonably well until loss of insurance which resulted in being off Humira from September 2018 through December.  She is back on Humira with some improvement in bowel symptoms, however she feels that her joint pain and low back pain are considerably  worse now that she is back on Humira.  She told me that Cymbalta has helped her joint pains in the past but she has not been on this recently (this was prescribed for fibromyalgia).  She would prefer to stay on Humira at this time.     We will add on medications for irritable bowel syndrome to see if this calms down her symptoms.  If not I think we could pursue a workup for drug-induced lupus from the anti-TNF and consider changing therapy.    PLAN:  1.  Continue Humira 1 pen weekly  2.  Up date lab work today  3.  Flu shot today  4.  Start Desipramine - start 25mg  nightly for 2 weeks. Then, try increasing to 50mg  nightly.   5.  Continue Dicyclomine 10mg  three times daily. Ok to stop Cipro.   6.  Follow up in ~4 months  Future considerations: uptitrate desipramine, Check labs for drug-induced lupus (dsDNA, histone Ab, ANA, CCP, RF), consider changing to non-TNF medication.  Could consider Cymbalta.  --------------------------------------------  Author: Zetta Bills 07/25/2017 6:08 PM    Zetta Bills, MD  Assistant Professor of Medicine  Division of Gastroenterology & Hepatology  [facility]

## 2017-07-26 NOTE — Unmapped (Signed)
[facility] Refill Coordination Note  Specialty Medication(s): Humira 40mg /0.81ml  Additional Medications shipped: none    Jasmine Mitchell, DOB: [birth year]  Phone: [phone] (home) , Alternate phone contact: N/A  Phone or address changes today?: No  All above HIPAA information was verified with patient.  Shipping Address: [address] [name]  [name]   Insurance changes? No    Completed refill call assessment today to schedule patient's medication shipment from the [facility] [phone]).      Confirmed the medication and dosage are correct and have not changed: Yes, regimen is correct and unchanged.    Confirmed patient started or stopped the following medications in the past month:  No, there are no changes reported at this time.    Are you tolerating your medication?:  Jasmine Mitchell reports tolerating the medication.    ADHERENCE    Did you miss any doses in the past 4 weeks? No missed doses reported.    FINANCIAL/SHIPPING    Delivery Scheduled: Yes, Expected medication delivery date: 07/28/17     Jasmine Mitchell did not have any additional questions at this time.    Delivery address validated in FSI scheduling system: Yes, address listed in FSI is correct.    We will follow up with patient monthly for standard refill processing and delivery.      Thank you,  Lupita Shutter   [facility] Specialty Pharmacist

## 2017-08-01 LAB — THIOPURINE METHYLTRANFERASE: 6-METHYLMERCAPTOPURINE RIBOSIDE: 5.1 nmol/mL/h

## 2017-08-01 LAB — 6-METHYLMERCAPTOPURINE RIBOSIDE: Thiopurine methyltransferase:CCnc:Pt:RBC:Qn:Production of 6-Methylmercaptopurine riboside: 5.1

## 2017-08-21 NOTE — Unmapped (Signed)
[facility] Refill Coordination Note  Specialty Medication(s):HUMIRA PEN 40MG /0.4ML    Jasmine Mitchell, DOB: 1970-10-29  Phone: [phone] (home) , Alternate phone contact: N/A  Phone or address changes today?: No  All above HIPAA information was verified with patient.  Shipping Address: [address] [name]  [place] [address]   Insurance changes? No    Completed refill call assessment today to schedule patient's medication shipment from the [facility] [phone]).      Confirmed the medication and dosage are correct and have not changed: Yes, regimen is correct and unchanged.    Confirmed patient started or stopped the following medications in the past month:  No, there are no changes reported at this time.    Are you tolerating your medication?:  [name] reports tolerating the medication.    ADHERENCE    (Below is required for Medicare Part B or Transplant patients only - per drug):   How many tablets were dispensed last month: 1 BOX (4 PENS)  Patient currently has 1 PEN TAKING DOSE TONIGHT remaining.    Did you miss any doses in the past 4 weeks? No missed doses reported.    FINANCIAL/SHIPPING    Delivery Scheduled: Yes, Expected medication delivery date: 08/25/17     The patient will receive an FSI print out for each medication shipped and additional FDA Medication Guides as required.  Patient education from [name] or [name] may also be included in the shipment    [name] did not have any additional questions at this time.    Delivery address validated in FSI scheduling system: Yes, address listed in FSI is correct.    We will follow up with patient monthly for standard refill processing and delivery.      Thank you,  [name]   [facility] Specialty Technician

## 2017-08-24 NOTE — Unmapped (Signed)
HUMIRA is not going out today, due to pt premium needs attention. I left her a voicemail today (08/24/17) to let her know.    Julieanne Cotton, CPhT  Pharmacy Operations Specialist    [facility]: [address]  [address] [place], [place] [address]  [phone]  [email or web address]

## 2017-09-26 NOTE — Unmapped (Signed)
[facility] Pharm[facility] Refill Coordination Note  Specialty Medication(s): HUMIRA  Additional Medications shipped: NONE    Jasmine Mitchell, DOB: January 29, 1971  Phone: [phone] (home) , Alternate phone contact: N/A  Phone or address changes today?: No  All above HIPAA information was verified with patient.  Shipping Address: [address] Jasmine Mitchell  [place] [address]   Insurance changes? No    Completed refill call assessment today to schedule patient's medication shipment from the [facility] [phone]).      Confirmed the medication and dosage are correct and have not changed: Yes, regimen is correct and unchanged.    Confirmed patient started or stopped the following medications in the past month:  Reports stopping the following medications: cipro    Are you tolerating your medication?:  Jasmine Mitchell reports tolerating the medication.    ADHERENCE      Did you miss any doses in the past 4 weeks? No missed doses reported.Was late on last dose due to ins and shipment issues. Patient aware to call us with plenty time ahead of time.    FINANCIAL/SHIPPING    Delivery Scheduled: Yes, Expected medication delivery date: 10/03/17     The patient will receive an FSI print out for each medication shipped and additional FDA Medication Guides as required.  Patient education from [place] or [name] may also be included in the shipment    Jasmine Mitchell did not have any additional questions at this time.    Delivery address validated in FSI scheduling system: Yes, address listed in FSI is correct.    We will follow up with patient monthly for standard refill processing and delivery.      Thank you,  Thad Ranger   [facility] Specialty Pharmacist

## 2017-10-02 ENCOUNTER — Telehealth: Payer: Self-pay

## 2017-10-02 ENCOUNTER — Ambulatory Visit: Payer: BLUE CROSS/BLUE SHIELD | Admitting: Family Medicine

## 2017-10-02 ENCOUNTER — Encounter: Payer: Self-pay | Admitting: Family Medicine

## 2017-10-02 VITALS — BP 110/70 | HR 52 | Temp 98.0°F | Ht 64.75 in | Wt 229.4 lb

## 2017-10-02 DIAGNOSIS — N390 Urinary tract infection, site not specified: Secondary | ICD-10-CM

## 2017-10-02 LAB — POCT URINALYSIS DIPSTICK
Bilirubin, UA: NEGATIVE
Glucose, UA: NEGATIVE
KETONES UA: NEGATIVE
Leukocytes, UA: NEGATIVE
Nitrite, UA: NEGATIVE
PH UA: 7.5 (ref 5.0–8.0)
Spec Grav, UA: 1.015 (ref 1.010–1.025)
Urobilinogen, UA: 0.2 E.U./dL

## 2017-10-02 MED ORDER — FLUTICASONE PROPIONATE 50 MCG/ACT NA SUSP: 4.0000 | Freq: Every day | NASAL | 0 refills | Status: DC

## 2017-10-02 MED ORDER — NITROFURANTOIN MONOHYD MACRO 100 MG PO CAPS: 100.0000 mg | ORAL_CAPSULE | Freq: Two times a day (BID) | ORAL | 0 refills | Status: DC

## 2017-10-02 NOTE — Telephone Encounter (Signed)
Ok to send . Thanks.

## 2017-10-02 NOTE — Telephone Encounter (Signed)
Rx done. 

## 2017-10-02 NOTE — Addendum Note (Signed)
Addended by: Agnes Lawrence on: 10/02/2017 12:25 PM   Modules accepted: Orders

## 2017-10-02 NOTE — Progress Notes (Signed)
   Subjective:    Patient ID: Katrina Grant, female    DOB: [birth year], 47 y.o.   MRN: [id number]  HPI Here for 2 weeks of urinary urgency and burning. No back pain or fever.     Review of Systems  Constitutional: Negative.   Respiratory: Negative.   Cardiovascular: Negative.   Genitourinary: Positive for dysuria, frequency and urgency. Negative for flank pain and hematuria.       Objective:   Physical Exam  Constitutional: She appears well-developed and well-nourished.  Cardiovascular: Normal rate, regular rhythm, normal heart sounds and intact distal pulses.  Pulmonary/Chest: Effort normal and breath sounds normal. No respiratory distress. She has no wheezes. She has no rales.          Assessment & Plan:  UTI, treat with Macrobid. Culture the sample.  Alysia Penna, MD

## 2017-10-02 NOTE — Telephone Encounter (Signed)
Pt was seen today by Dr. Sarajane Jews requesting a 90 day supply refill for Flonase send tto [facility].   Sent to Pric[facility] Holm

## 2017-10-04 ENCOUNTER — Other Ambulatory Visit: Payer: Self-pay | Admitting: *Deleted

## 2017-10-04 LAB — URINE CULTURE
MICRO NUMBER:: 90461786
SPECIMEN QUALITY: ADEQUATE

## 2017-10-04 MED ORDER — FLUTICASONE PROPIONATE 50 MCG/ACT NA SUSP: 1.0000 | Freq: Every day | NASAL | 0 refills | Status: DC

## 2017-10-04 NOTE — Telephone Encounter (Signed)
Rx re-sent with correct sigs.

## 2017-10-30 NOTE — Unmapped (Signed)
[facility] Refill Coordination Note    Specialty Medication(s) to be Shipped:   General Specialty: HUMIRA 40/0.4    Other medication(s) to be shipped:       Jasmine Mitchell, DOB: [birth year]  Phone: [phone] (home)   Shipping Address: [address]  [place] [address]    All above HIPAA information was verified with patient.     Completed refill call assessment today to schedule patient's medication shipment from the [facility] [phone]).       Specialty medication(s) and dose(s) confirmed: Regimen is correct and unchanged.   Changes to medications: Jasmine Mitchell reports no changes reported at this time.  Changes to insurance: No  Questions for the pharmacist: No    The patient will receive an FSI print out for each medication shipped and additional FDA Medication Guides as required.  Patient education from [place] or [name] may also be included in the shipment.    DISEASE-SPECIFIC INFORMATION        N/A    ADHERENCE              MEDICARE PART B DOCUMENTATION         SHIPPING     Shipping address confirmed in FSI.     Delivery Scheduled: Yes, Expected medication delivery date: [phone] via UPS or courier.     Jasmine Mitchell   [facility] Specialty Technician

## 2017-11-06 NOTE — Unmapped (Signed)
Spoke with pt as Jasmine Mitchell pharmacy questing what her correct BCBS ID number is for Humira to be processed. She said she has new isnurance with her job and it is [employer] [place] (ID: [id number])

## 2017-11-27 MED ORDER — DESIPRAMINE 25 MG TABLET
ORAL_TABLET | 2 refills | 0 days | Status: CP
Start: 2017-11-27 — End: 2018-09-14

## 2017-11-28 NOTE — Unmapped (Signed)
[facility] Refill Coordination Note  Specialty Medication(s): HUMIRA  Additional Medications shipped: NONE    Avnoor Koury, DOB: Oct 28, 1970  Phone: [phone] (home) , Alternate phone contact: N/A  Phone or address changes today?: No  All above HIPAA information was verified with patient.  Shipping Address: [address] [name]  [place] [address]   Insurance changes? No    Completed refill call assessment today to schedule patient's medication shipment from the [facility] [phone]).      Confirmed the medication and dosage are correct and have not changed: Yes, regimen is correct and unchanged.    Confirmed patient started or stopped the following medications in the past month:  No, there are no changes reported at this time.    Are you tolerating your medication?:  Daneen reports tolerating the medication.    ADHERENCE    Did you miss any doses in the past 4 weeks? No missed doses reported.    FINANCIAL/SHIPPING    Delivery Scheduled: Yes, Expected medication delivery date: 11/30/17 VIA UPS     The patient will receive an FSI print out for each medication shipped and additional FDA Medication Guides as required.  Patient education from [place] or [name] may also be included in the shipment    Lilliam did not have any additional questions at this time.    Delivery address validated in FSI scheduling system: Yes, address listed in FSI is correct.    We will follow up with patient monthly for standard refill processing and delivery.      Thank you,  Thad Ranger   [facility] Specialty Pharmacist

## 2017-12-08 ENCOUNTER — Encounter: Admit: 2017-12-08 | Discharge: 2017-12-09 | Payer: PRIVATE HEALTH INSURANCE

## 2017-12-08 DIAGNOSIS — E669 Obesity, unspecified: Secondary | ICD-10-CM | POA: Diagnosis not present

## 2017-12-08 DIAGNOSIS — R918 Other nonspecific abnormal finding of lung field: Secondary | ICD-10-CM | POA: Diagnosis not present

## 2017-12-08 DIAGNOSIS — K5080 Crohn's disease of both small and large intestine without complications: Secondary | ICD-10-CM | POA: Diagnosis not present

## 2017-12-08 DIAGNOSIS — R152 Fecal urgency: Secondary | ICD-10-CM | POA: Diagnosis not present

## 2017-12-08 DIAGNOSIS — M797 Fibromyalgia: Secondary | ICD-10-CM | POA: Diagnosis not present

## 2017-12-08 DIAGNOSIS — Z79899 Other long term (current) drug therapy: Secondary | ICD-10-CM | POA: Diagnosis not present

## 2017-12-08 DIAGNOSIS — Z86010 Personal history of colonic polyps: Secondary | ICD-10-CM | POA: Diagnosis not present

## 2017-12-08 DIAGNOSIS — Z87440 Personal history of urinary (tract) infections: Secondary | ICD-10-CM | POA: Diagnosis not present

## 2017-12-08 DIAGNOSIS — R21 Rash and other nonspecific skin eruption: Secondary | ICD-10-CM | POA: Diagnosis not present

## 2017-12-08 DIAGNOSIS — Z885 Allergy status to narcotic agent status: Secondary | ICD-10-CM | POA: Diagnosis not present

## 2017-12-08 DIAGNOSIS — R300 Dysuria: Secondary | ICD-10-CM | POA: Diagnosis not present

## 2017-12-08 DIAGNOSIS — K580 Irritable bowel syndrome with diarrhea: Secondary | ICD-10-CM | POA: Diagnosis not present

## 2017-12-08 DIAGNOSIS — Z6841 Body Mass Index (BMI) 40.0 and over, adult: Secondary | ICD-10-CM | POA: Diagnosis not present

## 2017-12-08 LAB — URINALYSIS
BACTERIA: NONE SEEN /HPF
BILIRUBIN UA: NEGATIVE
GLUCOSE UA: NEGATIVE
KETONES UA: NEGATIVE
LEUKOCYTE ESTERASE UA: NEGATIVE
NITRITE UA: NEGATIVE
PH UA: 5.5 (ref 5.0–9.0)
PROTEIN UA: NEGATIVE
RBC UA: 2 /HPF (ref <=4)
SPECIFIC GRAVITY UA: 1.007 (ref 1.003–1.030)
UROBILINOGEN UA: 0.2
WBC UA: 1 /HPF (ref 0–5)

## 2017-12-08 LAB — SQUAMOUS EPITHELIAL: Lab: 2

## 2017-12-08 MED ORDER — DESIPRAMINE 25 MG TABLET
ORAL_TABLET | Freq: Every day | ORAL | 5 refills | 0.00000 days | Status: CP
Start: 2017-12-08 — End: 2018-06-06

## 2017-12-08 NOTE — Unmapped (Signed)
[facility]   FOLLOWUP NOTE - INFLAMMATORY BOWEL DISEASE  12/08/2017    Demographics:  Jasmine Mitchell is a 47 y.o. year old female    Diagnosis:  Crohn's Disease  Disease onset (yr): 64  Age at onset:   < 71 yr old (A1)  Location:  Ileocolonic (L3) (mostly colonic)  Behavior:  Perianal (P), Stricturing (B2)  Current Tight Control Scenario:   Maintenance = Biologic          HPI / NOTE :     Today, I saw Jasmine Mitchell in the [facility].    HPI:    Interval Events:   1.  Humira - once weekly pen - doing well with that  2.  Has been taking desipramine 50mg  daily - feels that this has helped considerably, less abdominal pain, BMs less frequent now, also helping the joints.   3.  Got married in May 2019 - went well.  Did have some stress around the wedding, improving now.   4.  Has a small red patch on the back of her neck, feels this has been bothering her since she has been back on humira, it is a little pruritic  5.  Has dysuria and pressure.  Recently got Macrobid which helped a little.  Feels like a UTI.     No extraintestinal manifestations of IBD.     Abdominal pain (0-10):  crampy pain, worse with BMs  BM a day: 4-5x/day   Consistency: loose  % of stools have blood: 0%  Nocturnal BM: rare  Urgency:  Yes ++  Weight change over last 6 mo: stable  Smoking:  no  NSAIDS: avoids    Review of Systems:   Review of systems positive for: negative except as above.   Otherwise, the balance of 10 systems is negative.          IBD HISTORY:     Brief IBD Disease Course:    - 3 - dx with Crohn's dz ~age 89, sx of diarrhea, abd pain, rectal pain and bloody stools.  Treated with sulfasalazine, Asacol, Pentasa without benefit.  Got courses of steroids and antibiotics on and off.  Tried on but had nausea.   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    - 2004 - Remicade x 9 months. Stopped in 2005 due to 2ndary loss of response.   - 2005 - developed a pyoderma lesion. - 2010 - Re-established care with Dr. Marcella Dubs.  Concern for internal fistulizing disease.  Was on Cimzia. Had surgery for perianal disease.   - 2011 - Started on Humira.   - 2012 - active perianal disease with a large abscess.  Required EUA, drainage, setons (Sadiq).   - 2013 - April, dx with pelvic floor dyssynergia and anal sphincter weakness, underwent biofeedback x 2 courses ([place] and [place]).   - 2014 - on - doing well on Humira and long term Cipro.  Having symptoms which have been thought to be related to intermittent IBS symptoms.     Endoscopy:      - Colonoscopy 06/17/2003: patch inflammation and ulcers throughout the colon.    - Colonoscopy 01/2009:  Active crohn's at IC valve. Pseudopolyps.  Retraction of IC valve. ?Rectosigmoid fistula tract.  Otherwise normal colon.   - Colonoscopy 10/08/2009:  Perianal fistula, pseudopolyps.  Few aphthae in TI.   - Colonoscopy 07/29/2010:  Poor colon prep, pseudopolyps, stricture at IC valve.  Few ulcers at  IC valve. Rectal stricture. Ileal crohn's disease.   - Colonoscopy 05/05/2011:  Pseduopolyps throughout.  4mm cecal polyp. Large fistula in sigmoid colon. Normal TI.   - Colonoscopy 09/06/2012:  Healed scars from prior fistulotomies on perianal exam. Scar in ascending colon. No active Crohn's.   - Colonoscopy 10/24/13:  Pseudopolyps in rectosigmoid, otherwise normal colon and TI.   - Colonoscopy 10/01/14:  Pseudopolyps in rectum, transverse and ascending colon.  Otherwise normal colon.  Granular TI.  Fixed and open IC valve (not stenotic).   - Colonoscopy 08/12/16:  Mild anal canal stenosis on exam, otherwise normal colon and ileum.     PATH = no dysplasia    Imaging:    - CT A/P 10/24/2013: mild colonic wall thickening at splenic/descending colon, no other bowel inflammation or stranding.  *Pachy groundglass opacity in left lung base.     Prior IBD medications (type, dose, duration, response):  x 5-ASAs - Sulfasalazine, Asacol, Pentasa  x Oral corticosteroids - Prednisone ? Intravenous corticosteroids  x Antibiotics - Flagyl - nausea.  Cipro - tolerates well and it helps.   x Thiopurines - - nausea.   x Methotrexate - 2005.   x Anti-TNF therapies - Remicade 2004 - 2005 (~6 doses).  Had gradual loss of response so stopped. Tried on Remicade again in 2009 with some effect, increased to 10mg /kg q4 weeks. Cimzia - started 09/2008.  Humira 2014 - dose increased to 80mg  q2 weeks.  04/2016 - humira level 5.6, Ab negative.   ? Cyclosporine  ? Clinical trial medication  ? Other (Please specify):    IBD health maintenance:  Influenza vaccine: 2012, 117/17  Pneumonia vaccine: Prevnar spring 2017 by patient report (at PCP office)  Hepatitis B:   TB testing:   Chickenpox/Shingles history:   Bone denistometry:   Derm appointment:  Last small bowel imaging:   Last colonoscopy: 10/01/2014  PAP smear:     Extraintestinal manifestations:   -joint pains affecting: n  -eye: n  -skin: n  -oral ulcers :  n  -blood clots: n  -PSC: n  -other: n          Past Medical History:   Past medical history:   Past Medical History:   Diagnosis Date   ??? Acid reflux    ??? Arthritis    ??? Crohn disease (CMS-HCC)    ??? Fibromyalgia    ??? UTI (urinary tract infection)      Past surgical history:   Past Surgical History:   Procedure Laterality Date   ??? FRACTURE SURGERY      left wrist   ??? GASTROCUTANEOUS FISTULA CLOSURE     ??? ORTHOPEDIC SURGERY     ??? PR COLONOSCOPY FLX DX W/COLLJ SPEC WHEN PFRMD N/A 10/24/2013    Procedure: COLONOSCOPY, FLEXIBLE, PROXIMAL TO SPLENIC FLEXURE; DIAGNOSTIC, W/WO COLLECTION SPECIMEN BY BRUSH OR WASH;  Surgeon: Theadore Nan, MD;  [facility];  Service: Gastroenterology   ??? PR COLONOSCOPY W/BIOPSY SINGLE/MULTIPLE N/A 10/01/2014    Procedure: COLONOSCOPY, FLEXIBLE, PROXIMAL TO SPLENIC FLEXURE; WITH BIOPSY, SINGLE OR MULTIPLE;  Surgeon: Teodoro Spray, MD;  Location: [facility] PROC[facility];  Service: Gastroenterology   ??? PR COLONOSCOPY W/BIOPSY SINGLE/MULTIPLE N/A 08/12/2016    Procedure: COLONOSCOPY, FLEXIBLE, PROXIMAL TO SPLENIC FLEXURE; WITH BIOPSY, SINGLE OR MULTIPLE;  Surgeon: Zetta Bills, MD;  Location: [facility] PROCEDURES [facility];  Service: Gastroenterology   ??? PR SURG DIAGNOSTIC EXAM, ANORECTAL N/A 12/23/2016    Procedure: ANORECTAL  EXAM, SURGICAL, REQUIRING ANESTHESIA (GENERAL, SPINAL, OR EPIDURAL), DIAGNOSTIC;  Surgeon: Mickle Asper, MD;  Location: MAIN OR [place];  Service: Gastrointestinal   ??? PR UPPER GI ENDOSCOPY,DIAGNOSIS N/A 08/12/2016    Procedure: UGI ENDO, INCLUDE ESOPHAGUS, STOMACH, & DUODENUM &/OR JEJUNUM; DX W/WO COLLECTION SPECIMN, BY BRUSH OR WASH;  Surgeon: Zetta Bills, MD;  Location: [facility] PROCEDURES [facility];  Service: Gastroenterology   ??? TUBAL LIGATION       Family history:   Family History   Problem Relation Age of Onset   ??? Cancer Sister    ??? Cancer Paternal Grandmother    ??? Colorectal Cancer Neg Hx      Social history:   Social History     Socioeconomic History   ??? Marital status: Single     Spouse name: None   ??? Number of children: None   ??? Years of education: None   ??? Highest education level: None   Occupational History   ??? None   Social Needs   ??? Financial resource strain: None   ??? Food insecurity:     Worry: None     Inability: None   ??? Transportation needs:     Medical: None     Non-medical: None   Tobacco Use   ??? Smoking status: Never Smoker   ??? Smokeless tobacco: Never Used   Substance and Sexual Activity   ??? Alcohol use: Yes     Alcohol/week: 0.0 oz     Comment: socially    ??? Drug use: No   ??? Sexual activity: None   Lifestyle   ??? Physical activity:     Days per week: None     Minutes per session: None   ??? Stress: None   Relationships   ??? Social connections:     Talks on phone: None     Gets together: None     Attends religious service: None     Active member of club or organization: None     Attends meetings of clubs or organizations: None     Relationship status: None   Other Topics Concern   ??? None   Social History Narrative ??? None             Allergies:     Allergies   Allergen Reactions   ??? Oxycodone (Bulk) Nausea And Vomiting     Patient tolerates acetaminophen well             Medications:     Current Outpatient Medications   Medication Sig Dispense Refill   ??? acetaminophen (TYLENOL) 500 MG tablet Take 1,000 mg by mouth every four (4) hours as needed for pain.     ??? ADALIMUMAB PEN CITRATE FREE 40 MG/0.4 ML Inject 0.4 mL (40 mg total) under the skin every seven (7) days. 4 each 11   ??? desipramine (NORPRAMIN) 25 MG tablet TAKE 2 TABLETS BY MOUTH EVERY DAY 60 tablet 2   ??? dicyclomine (BENTYL) 10 mg capsule Take 1 capsule (10 mg total) by mouth three (3) times a day. 90 capsule 6   ??? fluticasone (FLONASE) 50 mcg/actuation nasal spray 1 SPRAY BY EACH NARE ROUTE DAILY. AS DIRECTED (Patient taking differently: 1 spray by Each Nare route daily as needed. As directed) 16 mL 2   ??? loratadine (CLARITIN) 10 mg tablet Take 10 mg by mouth once as needed.      ??? ondansetron (ZOFRAN-ODT) 8 MG disintegrating tablet Take 8 mg by mouth every  eight (8) hours as needed.      ??? adalimumab (HUMIRA PEN CROHN'S-UC-HS START) 80 mg/0.8 mL PnKt 160 mg (2 pens) day 1, then 80 mg (1 pen) on day 15 then 40 mg every week (Patient not taking: Reported on 12/08/2017) 1 kit 0   ??? cholestyramine-aspartame (CHOLESTYRAMINE LIGHT) 4 gram PwPk Take 1 packet by mouth Two (2) times a day. (Patient not taking: Reported on 12/08/2017) 180 packet 3   ??? ciprofloxacin HCl (CIPRO) 500 MG tablet TAKE 1 TABLET BY MOUTH 2 TIMES A DAY (Patient not taking: Reported on 07/25/2017) 60 tablet 2   ??? desipramine (NORPRAMIN) 25 MG tablet Take 2 tablets (50 mg total) by mouth daily. 60 tablet 5   ??? MULTIVIT WITH CALCIUM,IRON,MIN (WOMEN'S DAILY MULTIVITAMIN ORAL) Take 1 tablet by mouth once daily.     ??? oxyCODONE (ROXICODONE) 5 MG immediate release tablet Take 1 tablet (5 mg total) by mouth every six (6) hours as needed for pain. (Patient not taking: Reported on 01/20/2017) 10 tablet 0     No current facility-administered medications for this visit.              Physical Exam:   BP 130/81  - Pulse 89  - Temp 36.6 ??C (97.9 ??F) (Temporal)  - Wt (!) 106.6 kg (235 lb)  - BMI 40.32 kg/m??     GEN:  Obese, well appearing female in no apparent distress, appears comfortable on exam  HEENT: PEERL, OP clear with no erythema, lesions, exudate, mucous membranes moist  NECK: Supple, no lymphadenopathy  PULM: CTAB, no wheezes, rales, or rhonchi  CV: S1/S2, RRR, no murmurs  GI: Soft, mildly tender in lower abdomen (LLQ), nondistended, normoactive bowel sounds, no rebound/guarding, no appreciable organomegaly  Extremities: no cyanosis, clubbing or edema, normal gait  Psych: affect appropriate, A&O x3  Skin:  Small red patch (~1cm), raised, erythematous with some scale on posterior left neck          Labs, Data & Indices:     Lab Review:   Lab Results   Component Value Date    WBC 6.3 07/25/2017    WBC 6.5 04/29/2016    RBC 4.60 07/25/2017    RBC 4.50 04/29/2016    HGB 13.4 07/25/2017    HGB 13.2 04/29/2016     Lab Results   Component Value Date    AST 18 07/25/2017    AST 13 04/29/2016    ALT 20 07/25/2017    ALT 9 04/29/2016    BUN 13 07/25/2017    BUN 11 04/29/2016    CREATININE 0.63 07/25/2017    CREATININE 0.80 04/29/2016    CO2 28.0 07/25/2017    CO2 21 04/29/2016    ALBUMIN 4.1 07/25/2017    ALBUMIN 4.3 08/19/2014    CALCIUM 9.6 07/25/2017    CALCIUM 9.4 04/29/2016     Lab Results   Component Value Date    TSH 2.810 04/29/2016      ...........................................................................................................................................Marland Kitchen   Diagnosis ICD-10-CM Associated Orders   1. Dysuria R30.0 Urinalysis     Urine Culture   2. Crohn's disease of both small and large intestine without complication (CMS-HCC) K50.80    3. Irritable bowel syndrome with diarrhea K58.0 desipramine (NORPRAMIN) 25 MG tablet   4. Skin rash R21            Assessment & Recommendations:   Disease state: Ileocolonic crohn's with perianal disease and anorectal stricture    Jasmine Mitchell is  a 47 y.o. female with a long standing hx of stricturing ileocolonic Crohn's disease.  She also has hx of perianal disease last requiring drainage by Dr. Elenore Rota (summer 2018).  She is currently maintained on weekly Humira monotherapy and doing symptomatically well on that.  She also has Irritable bowel with diarrhea and pain, which is notably improved since starting Desipramine.      PLAN:  CROHN'S DISEASE  1.  Continue Humira 1 pen weekly    IBS-D  2.  Continue Desipramine 50mg  qHS  3.  Continue Dicyclomine 10mg  three times daily    DYSURIA - Suspect UTI  4.  Check UA, UCulture today.  If signs of UTI, will send in ABX (likely bactrim) + diflucan x 4 doses    SKIN RASH - patch on neck  5.  She will try hydrocortisone cream x2 wks    6.  Follow up in ~4 months  --------------------------------------------  Author: Zetta Bills 12/08/2017 9:35 AM    Zetta Bills, MD  Assistant Professor of Medicine  Division of Gastroenterology & Hepatology  [place] [facility]

## 2017-12-08 NOTE — Unmapped (Signed)
INSTRUCTIONS:     1.  Continue weekly Humira  2.  Continue Desipramine 50mg  daily (2 tablets), refills sent  3.  Try hydrocortisone cream (OTC) twice daily, pea sized amount on the small spot on the back of your neck  4.  We will check a urine sample today, may need antibiotics if UTI.   5.  If any trouble or symptoms, do not hesitate to call.     Zetta Bills, MD  Assistant Professor of Medicine  Division of Gastroenterology & Hepatology  [place] [facility] [place]    Clinical Nurse Contact:  Neta Mends, RN  Ph#  [phone]  Fax#  [phone]    Office Number Rubbie Battiest):  Ph# [phone]  Fax # [phone]    For clinic appointments, please call, [phone], option 1.  For questions regarding radiology appointments, or to schedule, [phone].  For questions regarding scheduling GI procedures (e.g,. Colonoscopy), please call, [phone], option 2.    For educational material and resources:  [email or web address]    ============================================

## 2017-12-13 ENCOUNTER — Encounter: Payer: Self-pay | Admitting: Obstetrics

## 2017-12-13 ENCOUNTER — Ambulatory Visit (INDEPENDENT_AMBULATORY_CARE_PROVIDER_SITE_OTHER): Payer: BLUE CROSS/BLUE SHIELD | Admitting: Obstetrics

## 2017-12-13 ENCOUNTER — Other Ambulatory Visit: Payer: Self-pay

## 2017-12-13 VITALS — BP 123/84 | HR 72 | Temp 98.0°F | Ht 65.0 in | Wt 231.0 lb

## 2017-12-13 DIAGNOSIS — Z0000 Encounter for general adult medical examination without abnormal findings: Secondary | ICD-10-CM | POA: Diagnosis not present

## 2017-12-13 DIAGNOSIS — N760 Acute vaginitis: Secondary | ICD-10-CM | POA: Diagnosis not present

## 2017-12-13 DIAGNOSIS — N3001 Acute cystitis with hematuria: Secondary | ICD-10-CM | POA: Diagnosis not present

## 2017-12-13 DIAGNOSIS — R7303 Prediabetes: Secondary | ICD-10-CM | POA: Diagnosis not present

## 2017-12-13 DIAGNOSIS — Z124 Encounter for screening for malignant neoplasm of cervix: Secondary | ICD-10-CM | POA: Diagnosis not present

## 2017-12-13 DIAGNOSIS — Z113 Encounter for screening for infections with a predominantly sexual mode of transmission: Secondary | ICD-10-CM

## 2017-12-13 DIAGNOSIS — E669 Obesity, unspecified: Secondary | ICD-10-CM

## 2017-12-13 DIAGNOSIS — Z1151 Encounter for screening for human papillomavirus (HPV): Secondary | ICD-10-CM

## 2017-12-13 DIAGNOSIS — Z01419 Encounter for gynecological examination (general) (routine) without abnormal findings: Secondary | ICD-10-CM | POA: Diagnosis not present

## 2017-12-13 DIAGNOSIS — Z1239 Encounter for other screening for malignant neoplasm of breast: Secondary | ICD-10-CM

## 2017-12-13 DIAGNOSIS — D699 Hemorrhagic condition, unspecified: Secondary | ICD-10-CM | POA: Diagnosis not present

## 2017-12-13 DIAGNOSIS — Z1329 Encounter for screening for other suspected endocrine disorder: Secondary | ICD-10-CM | POA: Diagnosis not present

## 2017-12-13 LAB — POCT URINALYSIS DIPSTICK
GLUCOSE UA: NEGATIVE
Protein, UA: POSITIVE — AB
SPEC GRAV UA: 1.02 (ref 1.010–1.025)
Urobilinogen, UA: 1 E.U./dL
pH, UA: 6.5 (ref 5.0–8.0)

## 2017-12-13 MED ORDER — PRENATAL PLUS 27-1 MG PO TABS: 1.0000 | ORAL_TABLET | Freq: Every day | ORAL | 11 refills | Status: DC

## 2017-12-13 MED ORDER — CEFUROXIME AXETIL 500 MG PO TABS: 500.0000 mg | ORAL_TABLET | Freq: Two times a day (BID) | ORAL | 2 refills | Status: DC

## 2017-12-13 NOTE — Progress Notes (Signed)
New GYN, presents for AEX/PAP/GC.  C/o vaginal burning, pressure, problems voiding and pain 5/10 radiating to back.  She denies fever, chills, NV. Last PAP 5++ years. Last Mammo 03/12/15.

## 2017-12-13 NOTE — Progress Notes (Signed)
Subjective:        Katrina Grant is a 47 y.o. female here for a routine exam.  Current complaints: Vaginal burning and irritation with pain and pressure radiating to back.  Was treated for UTI 2 months ago, got better, now UTI symptoms have returned.  Personal health questionnaire:  Is patient Ashkenazi Jewish, have a family history of breast and/or ovarian cancer: no Is there a family history of uterine cancer diagnosed at age < 54, gastrointestinal cancer, urinary tract cancer, family member who is a Field seismologist syndrome-associated carrier: no Is the patient overweight and hypertensive, family history of diabetes, personal history of gestational diabetes, preeclampsia or PCOS: no Is patient over 52, have PCOS,  family history of premature CHD under age 86, diabetes, smoke, have hypertension or peripheral artery disease:  no At any time, has a partner hit, kicked or otherwise hurt or frightened you?: no Over the past 2 weeks, have you felt down, depressed or hopeless?: no Over the past 2 weeks, have you felt little interest or pleasure in doing things?:no   Gynecologic History Patient's last menstrual period was 08/04/2017 (approximate). Contraception: tubal ligation Last Pap: ~5 years ago. Results were: normal Last mammogram: 2016. Results were: normal  Obstetric History OB History  Gravida Para Term Preterm AB Living  2 2 2     2   SAB TAB Ectopic Multiple Live Births               # Outcome Date GA Lbr Len/2nd Weight Sex Delivery Anes PTL Lv  2 Term           1 Term             Past Medical History:  Diagnosis Date  . ADD (attention deficit disorder)   . Anxiety   . Cholesterol serum elevated    on medication in the past  . Chronic kidney disease   . Crohn's disease of colon with fistula ([place])    sees Dr. Coralyn Mark, [facility]  . Depression   . Fibromyalgia - managed by Dr. Dossie Der, [facility] - takes Neurontin and Tramadol 11/20/2013  . Frequent headaches   . GERD  (gastroesophageal reflux disease)   . Osteoarthritis    knee, spine  . Pneumonia   . Syncope     Past Surgical History:  Procedure Laterality Date  . anal fistula repair      X 4   . colonoscopy with dilation     X 2   . FRACTURE SURGERY    . TUBAL LIGATION       Current Outpatient Medications:  .  acetaminophen (TYLENOL) 500 MG tablet, Take 1,000 mg by mouth every 6 (six) hours as needed. For migraines and stomach upset , Disp: , Rfl:  .  Adalimumab (HUMIRA) 40 MG/0.8ML PSKT, Inject 40 mg into the skin once a week., Disp: , Rfl:  .  cefUROXime (CEFTIN) 500 MG tablet, Take 1 tablet (500 mg total) by mouth 2 (two) times daily with a meal., Disp: 14 tablet, Rfl: 2 .  desipramine (NORPRAMIN) 25 MG tablet, Take by mouth., Disp: , Rfl:  .  dicyclomine (BENTYL) 10 MG capsule, Take 10 mg by mouth 2 (two) times daily., Disp: , Rfl:  .  fluticasone (FLONASE) 50 MCG/ACT nasal spray, , Disp: , Rfl:  .  fluticasone (FLONASE) 50 MCG/ACT nasal spray, Place 1 spray into both nostrils daily., Disp: 48 g, Rfl: 0 .  nitrofurantoin, macrocrystal-monohydrate, (MACROBID) 100 MG capsule, Take  1 capsule (100 mg total) by mouth 2 (two) times daily., Disp: 14 capsule, Rfl: 0 .  ondansetron (ZOFRAN ODT) 8 MG disintegrating tablet, Take 1 tablet (8 mg total) by mouth every 8 (eight) hours as needed for nausea or vomiting., Disp: 30 tablet, Rfl: 0 .  prenatal vitamin w/FE, FA (PRENATAL 1 + 1) 27-1 MG TABS tablet, Take 1 tablet by mouth daily before breakfast., Disp: 30 each, Rfl: 11 Allergies  Allergen Reactions  . Percocet [Oxycodone-Acetaminophen] Nausea And Vomiting    Social History   Tobacco Use  . Smoking status: Never Smoker  . Smokeless tobacco: Never Used  Substance Use Topics  . Alcohol use: Yes    Alcohol/week: 0.0 oz    Comment: ocassionally    Family History  Problem Relation Age of Onset  . Hypertension Mother   . ADD / ADHD Mother   . Anxiety disorder Mother   . Arthritis Mother   .  Depression Mother   . Varicose Veins Mother   . Diabetes Father   . Hearing loss Father   . Heart disease Father   . Cancer Sister        cervical  . ADD / ADHD Sister   . Anxiety disorder Sister   . Arthritis Sister   . Depression Sister   . Early death Sister   . Varicose Veins Sister   . Mental illness Brother        drugs and alcohol abuse  . ADD / ADHD Brother   . Alcohol abuse Brother   . Asthma Brother   . Depression Brother   . Drug abuse Brother   . ADD / ADHD Daughter       Review of Systems  Constitutional: negative for fatigue and weight loss Respiratory: negative for cough and wheezing Cardiovascular: negative for chest pain, fatigue and palpitations Gastrointestinal: negative for abdominal pain and change in bowel habits Musculoskeletal:negative for myalgias Neurological: negative for gait problems and tremors Behavioral/Psych: negative for abusive relationship, depression Endocrine: negative for temperature intolerance    Genitourinary: POSITIVE for burning, pressure and pain with urination, radiating to her back Integument/breast: negative for breast lump, breast tenderness, nipple discharge and skin lesion(s)    Objective:       BP 123/84   Pulse 72   Temp 98 F (36.7 C)   Ht 5' 5"  (1.651 m)   Wt 231 lb (104.8 kg)   LMP 08/04/2017 (Approximate)   BMI 38.44 kg/m  General:   alert  Skin:   no rash or abnormalities  Lungs:   clear to auscultation bilaterally  Heart:   regular rate and rhythm, S1, S2 normal, no murmur, click, rub or gallop  Breasts:   normal without suspicious masses, skin or nipple changes or axillary nodes  Abdomen:  normal findings: no organomegaly, soft, non-tender and no hernia  Pelvis:  External genitalia: normal general appearance Urinary system: urethral meatus normal and bladder without fullness, nontender Vaginal: normal without tenderness, induration or masses Cervix: normal appearance Adnexa: normal bimanual  exam Uterus: anteverted and non-tender, normal size   Lab Review Urine pregnancy test Labs reviewed yes Radiologic studies reviewed no  50% of 20 min visit spent on counseling and coordination of care.   Assessment:     1. Encounter for gynecological examination with Papanicolaou smear of cervix Rx: - Cytology - PAP  2. Acute vaginitis  3. Acute cystitis with hematuria Rx: - cefUROXime (CEFTIN) 500 MG tablet; Take 1 tablet (500 mg  total) by mouth 2 (two) times daily with a meal.  Dispense: 14 tablet; Refill: 2 - Urine Culture  4. Obesity (BMI 35.0-39.9 without comorbidity) - program including caloric reduction, exercise and lifestyle modification recommended  5. Screening breast examination Rx: - MM Digital Screening; Future  6. Routine adult health maintenance Rx: - Cervicovaginal ancillary only - POCT Urinalysis Dipstick - HgB A1c - TSH - Ferritin - Comprehensive metabolic panel - Cholesterol, total - Triglycerides - HDL cholesterol - Direct LDL - CBC - prenatal vitamin w/FE, FA (PRENATAL 1 + 1) 27-1 MG TABS tablet; Take 1 tablet by mouth daily before breakfast.  Dispense: 30 each; Refill: 11    Plan:    Education reviewed: calcium supplements, depression evaluation, low fat, low cholesterol diet, safe sex/STD prevention, self breast exams and weight bearing exercise. Mammogram ordered. Follow up in: 1 year.   Meds ordered this encounter  Medications  . cefUROXime (CEFTIN) 500 MG tablet    Sig: Take 1 tablet (500 mg total) by mouth 2 (two) times daily with a meal.    Dispense:  14 tablet    Refill:  2  . prenatal vitamin w/FE, FA (PRENATAL 1 + 1) 27-1 MG TABS tablet    Sig: Take 1 tablet by mouth daily before breakfast.    Dispense:  30 each    Refill:  11   Orders Placed This Encounter  Procedures  . Urine Culture  . MM Digital Screening    Ins  [employer] Pf  02/2215 @[employer] // NO ISSUES// no hx // no needs// no implants or reduction//kat/ aneetra       Standing Status:   Future    Standing Expiration Date:   02/13/2019    Order Specific Question:   Reason for Exam (SYMPTOM  OR DIAGNOSIS REQUIRED)    Answer:   Screening    Order Specific Question:   Is the patient pregnant?    Answer:   No    Order Specific Question:   Preferred imaging location?    Answer:   [facility]  . HgB A1c  . TSH  . Ferritin  . Comprehensive metabolic panel  . Cholesterol, total  . Triglycerides  . HDL cholesterol  . Direct LDL  . CBC  . POCT Urinalysis Dipstick    Shelly Bombard MD 12-13-2017

## 2017-12-13 NOTE — Patient Instructions (Signed)
Health Maintenance, Female Adopting a healthy lifestyle and getting preventive care can go a long way to promote health and wellness. Talk with your health care provider about what schedule of regular examinations is right for you. This is a good chance for you to check in with your provider about disease prevention and staying healthy. In between checkups, there are plenty of things you can do on your own. Experts have done a lot of research about which lifestyle changes and preventive measures are most likely to keep you healthy. Ask your health care provider for more information. Weight and diet Eat a healthy diet  Be sure to include plenty of vegetables, fruits, low-fat dairy products, and lean protein.  Do not eat a lot of foods high in solid fats, added sugars, or salt.  Get regular exercise. This is one of the most important things you can do for your health. ? Most adults should exercise for at least 150 minutes each week. The exercise should increase your heart rate and make you sweat (moderate-intensity exercise). ? Most adults should also do strengthening exercises at least twice a week. This is in addition to the moderate-intensity exercise.  Maintain a healthy weight  Body mass index (BMI) is a measurement that can be used to identify possible weight problems. It estimates body fat based on height and weight. Your health care provider can help determine your BMI and help you achieve or maintain a healthy weight.  For females 20 years of age and older: ? A BMI below 18.5 is considered underweight. ? A BMI of 18.5 to 24.9 is normal. ? A BMI of 25 to 29.9 is considered overweight. ? A BMI of 30 and above is considered obese.  Watch levels of cholesterol and blood lipids  You should start having your blood tested for lipids and cholesterol at 48 years of age, then have this test every 5 years.  You may need to have your cholesterol levels checked more often if: ? Your lipid or  cholesterol levels are high. ? You are older than 48 years of age. ? You are at high risk for heart disease.  Cancer screening Lung Cancer  Lung cancer screening is recommended for adults 55-80 years old who are at high risk for lung cancer because of a history of smoking.  A yearly low-dose CT scan of the lungs is recommended for people who: ? Currently smoke. ? Have quit within the past 15 years. ? Have at least a 30-pack-year history of smoking. A pack year is smoking an average of one pack of cigarettes a day for 1 year.  Yearly screening should continue until it has been 15 years since you quit.  Yearly screening should stop if you develop a health problem that would prevent you from having lung cancer treatment.  Breast Cancer  Practice breast self-awareness. This means understanding how your breasts normally appear and feel.  It also means doing regular breast self-exams. Let your health care provider know about any changes, no matter how small.  If you are in your 20s or 30s, you should have a clinical breast exam (CBE) by a health care provider every 1-3 years as part of a regular health exam.  If you are 40 or older, have a CBE every year. Also consider having a breast X-ray (mammogram) every year.  If you have a family history of breast cancer, talk to your health care provider about genetic screening.  If you are at high risk   for breast cancer, talk to your health care provider about having an MRI and a mammogram every year.  Breast cancer gene (BRCA) assessment is recommended for women who have family members with BRCA-related cancers. BRCA-related cancers include: ? Breast. ? Ovarian. ? Tubal. ? Peritoneal cancers.  Results of the assessment will determine the need for genetic counseling and BRCA1 and BRCA2 testing.  Cervical Cancer Your health care provider may recommend that you be screened regularly for cancer of the pelvic organs (ovaries, uterus, and  vagina). This screening involves a pelvic examination, including checking for microscopic changes to the surface of your cervix (Pap test). You may be encouraged to have this screening done every 3 years, beginning at age 22.  For women ages 56-65, health care providers may recommend pelvic exams and Pap testing every 3 years, or they may recommend the Pap and pelvic exam, combined with testing for human papilloma virus (HPV), every 5 years. Some types of HPV increase your risk of cervical cancer. Testing for HPV may also be done on women of any age with unclear Pap test results.  Other health care providers may not recommend any screening for nonpregnant women who are considered low risk for pelvic cancer and who do not have symptoms. Ask your health care provider if a screening pelvic exam is right for you.  If you have had past treatment for cervical cancer or a condition that could lead to cancer, you need Pap tests and screening for cancer for at least 20 years after your treatment. If Pap tests have been discontinued, your risk factors (such as having a new sexual partner) need to be reassessed to determine if screening should resume. Some women have medical problems that increase the chance of getting cervical cancer. In these cases, your health care provider may recommend more frequent screening and Pap tests.  Colorectal Cancer  This type of cancer can be detected and often prevented.  Routine colorectal cancer screening usually begins at 48 years of age and continues through 48 years of age.  Your health care provider may recommend screening at an earlier age if you have risk factors for colon cancer.  Your health care provider may also recommend using home test kits to check for hidden blood in the stool.  A small camera at the end of a tube can be used to examine your colon directly (sigmoidoscopy or colonoscopy). This is done to check for the earliest forms of colorectal  cancer.  Routine screening usually begins at age 33.  Direct examination of the colon should be repeated every 5-10 years through 48 years of age. However, you may need to be screened more often if early forms of precancerous polyps or small growths are found.  Skin Cancer  Check your skin from head to toe regularly.  Tell your health care provider about any new moles or changes in moles, especially if there is a change in a mole's shape or color.  Also tell your health care provider if you have a mole that is larger than the size of a pencil eraser.  Always use sunscreen. Apply sunscreen liberally and repeatedly throughout the day.  Protect yourself by wearing long sleeves, pants, a wide-brimmed hat, and sunglasses whenever you are outside.  Heart disease, diabetes, and high blood pressure  High blood pressure causes heart disease and increases the risk of stroke. High blood pressure is more likely to develop in: ? People who have blood pressure in the high end of  the normal range (130-139/85-89 mm Hg). ? People who are overweight or obese. ? People who are African American.  If you are 21-29 years of age, have your blood pressure checked every 3-5 years. If you are 3 years of age or older, have your blood pressure checked every year. You should have your blood pressure measured twice-once when you are at a hospital or clinic, and once when you are not at a hospital or clinic. Record the average of the two measurements. To check your blood pressure when you are not at a hospital or clinic, you can use: ? An automated blood pressure machine at a pharmacy. ? A home blood pressure monitor.  If you are between 17 years and 37 years old, ask your health care provider if you should take aspirin to prevent strokes.  Have regular diabetes screenings. This involves taking a blood sample to check your fasting blood sugar level. ? If you are at a normal weight and have a low risk for diabetes,  have this test once every three years after 48 years of age. ? If you are overweight and have a high risk for diabetes, consider being tested at a younger age or more often. Preventing infection Hepatitis B  If you have a higher risk for hepatitis B, you should be screened for this virus. You are considered at high risk for hepatitis B if: ? You were born in a country where hepatitis B is common. Ask your health care provider which countries are considered high risk. ? Your parents were born in a high-risk country, and you have not been immunized against hepatitis B (hepatitis B vaccine). ? You have HIV or AIDS. ? You use needles to inject street drugs. ? You live with someone who has hepatitis B. ? You have had sex with someone who has hepatitis B. ? You get hemodialysis treatment. ? You take certain medicines for conditions, including cancer, organ transplantation, and autoimmune conditions.  Hepatitis C  Blood testing is recommended for: ? Everyone born from [birth year] through 1966. ? Anyone with known risk factors for hepatitis C.  Sexually transmitted infections (STIs)  You should be screened for sexually transmitted infections (STIs) including gonorrhea and chlamydia if: ? You are sexually active and are younger than 48 years of age. ? You are older than 48 years of age and your health care provider tells you that you are at risk for this type of infection. ? Your sexual activity has changed since you were last screened and you are at an increased risk for chlamydia or gonorrhea. Ask your health care provider if you are at risk.  If you do not have HIV, but are at risk, it may be recommended that you take a prescription medicine daily to prevent HIV infection. This is called pre-exposure prophylaxis (PrEP). You are considered at risk if: ? You are sexually active and do not regularly use condoms or know the HIV status of your partner(s). ? You take drugs by injection. ? You are  sexually active with a partner who has HIV.  Talk with your health care provider about whether you are at high risk of being infected with HIV. If you choose to begin PrEP, you should first be tested for HIV. You should then be tested every 3 months for as long as you are taking PrEP. Pregnancy  If you are premenopausal and you may become pregnant, ask your health care provider about preconception counseling.  If you may become  pregnant, take 400 to 800 micrograms (mcg) of folic acid every day.  If you want to prevent pregnancy, talk to your health care provider about birth control (contraception). Osteoporosis and menopause  Osteoporosis is a disease in which the bones lose minerals and strength with aging. This can result in serious bone fractures. Your risk for osteoporosis can be identified using a bone density scan.  If you are 76 years of age or older, or if you are at risk for osteoporosis and fractures, ask your health care provider if you should be screened.  Ask your health care provider whether you should take a calcium or vitamin D supplement to lower your risk for osteoporosis.  Menopause may have certain physical symptoms and risks.  Hormone replacement therapy may reduce some of these symptoms and risks. Talk to your health care provider about whether hormone replacement therapy is right for you. Follow these instructions at home:  Schedule regular health, dental, and eye exams.  Stay current with your immunizations.  Do not use any tobacco products including cigarettes, chewing tobacco, or electronic cigarettes.  If you are pregnant, do not drink alcohol.  If you are breastfeeding, limit how much and how often you drink alcohol.  Limit alcohol intake to no more than 1 drink per day for nonpregnant women. One drink equals 12 ounces of beer, 5 ounces of wine, or 1 ounces of hard liquor.  Do not use street drugs.  Do not share needles.  Ask your health care  provider for help if you need support or information about quitting drugs.  Tell your health care provider if you often feel depressed.  Tell your health care provider if you have ever been abused or do not feel safe at home. This information is not intended to replace advice given to you by your health care provider. Make sure you discuss any questions you have with your health care provider. Document Released: 12/20/2010 Document Revised: 11/12/2015 Document Reviewed: 03/10/2015 Elsevier Interactive Patient Education  2018 [employer].  Exercising to [employer] Exercising can help you to lose weight. In order to lose weight through exercise, you need to do vigorous-intensity exercise. You can tell that you are exercising with vigorous intensity if you are breathing very hard and fast and cannot hold a conversation while exercising. Moderate-intensity exercise helps to maintain your current weight. You can tell that you are exercising at a moderate level if you have a higher heart rate and faster breathing, but you are still able to hold a conversation. How often should I exercise? Choose an activity that you enjoy and set realistic goals. Your health care provider can help you to make an activity plan that works for you. Exercise regularly as directed by your health care provider. This may include:  Doing resistance training twice each week, such as: ? Push-ups. ? Sit-ups. ? Lifting weights. ? Using resistance bands.  Doing a given intensity of exercise for a given amount of time. Choose from these options: ? 150 minutes of moderate-intensity exercise every week. ? 75 minutes of vigorous-intensity exercise every week. ? A mix of moderate-intensity and vigorous-intensity exercise every week.  Children, pregnant women, people who are out of shape, people who are overweight, and older adults may need to consult a health care provider for individual recommendations. If you have any sort of  medical condition, be sure to consult your health care provider before starting a new exercise program. What are some activities that can help me  to lose weight?  Walking at a rate of at least 4.5 miles an hour.  Jogging or running at a rate of 5 miles per hour.  Biking at a rate of at least 10 miles per hour.  Lap swimming.  Roller-skating or in-line skating.  Cross-country skiing.  Vigorous competitive sports, such as football, basketball, and soccer.  Jumping rope.  Aerobic dancing. How can I be more active in my day-to-day activities?  Use the stairs instead of the elevator.  Take a walk during your lunch break.  If you drive, park your car farther away from work or school.  If you take public transportation, get off one stop early and walk the rest of the way.  Make all of your phone calls while standing up and walking around.  Get up, stretch, and walk around every 30 minutes throughout the day. What guidelines should I follow while exercising?  Do not exercise so much that you hurt yourself, feel dizzy, or get very short of breath.  Consult your health care provider prior to starting a new exercise program.  Wear comfortable clothes and shoes with good support.  Drink plenty of water while you exercise to prevent dehydration or heat stroke. Body water is lost during exercise and must be replaced.  Work out until you breathe faster and your heart beats faster. This information is not intended to replace advice given to you by your health care provider. Make sure you discuss any questions you have with your health care provider. Document Released: 07/09/2010 Document Revised: 11/12/2015 Document Reviewed: 11/07/2013 Elsevier Interactive Patient Education  2018 [employer].  Preventing Unhealthy [employer], Adult Staying at a healthy weight is important. When fat builds up in your body, you may become overweight or obese. These conditions put you at greater  risk for developing certain health problems, such as heart disease, diabetes, sleeping problems, joint problems, and some cancers. Unhealthy weight gain is often the result of making unhealthy choices in what you eat. It is also a result of not getting enough exercise. You can make changes to your lifestyle to prevent obesity and stay as healthy as possible. What nutrition changes can be made? To maintain a healthy weight and prevent obesity:  Eat only as much as your body needs. To do this: ? Pay attention to signs that you are hungry or full. Stop eating as soon as you feel full. ? If you feel hungry, try drinking water first. Drink enough water so your urine is clear or pale yellow. ? Eat smaller portions. ? Look at serving sizes on food labels. Most foods contain more than one serving per container. ? Eat the recommended amount of calories for your gender and activity level. While most active people should eat around 2,000 calories per day, if you are trying to lose weight or are not very active, you main need to eat less calories. Talk to your health care provider or dietitian about how many calories you should eat each day.  Choose healthy foods, such as: ? Fruits and vegetables. Try to fill at least half of your plate at each meal with fruits and vegetables. ? Whole grains, such as whole wheat bread, brown rice, and quinoa. ? Lean meats, such as chicken or fish. ? Other healthy proteins, such as beans, eggs, or tofu. ? Healthy fats, such as nuts, seeds, fatty fish, and olive oil. ? Low-fat or fat-free dairy.  Check food labels and avoid food and drinks that: ?  Are high in calories. ? Have added sugar. ? Are high in sodium. ? Have saturated fats or trans fats.  Limit how much you eat of the following foods: ? Prepackaged meals. ? Fast food. ? Fried foods. ? Processed meat, such as bacon, sausage, and deli meats. ? Fatty cuts of red meat and poultry with skin.  Cook foods in  healthier ways, such as by baking, broiling, or grilling.  When grocery shopping, try to shop around the outside of the store. This helps you buy mostly fresh foods and avoid canned and prepackaged foods.  What lifestyle changes can be made?  Exercise at least 30 minutes 5 or more days each week. Exercising includes brisk walking, yard work, biking, running, swimming, and team sports like basketball and soccer. Ask your health care provider which exercises are safe for you.  Do not use any products that contain nicotine or tobacco, such as cigarettes and e-cigarettes. If you need help quitting, ask your health care provider.  Limit alcohol intake to no more than 1 drink a day for nonpregnant women and 2 drinks a day for men. One drink equals 12 oz of beer, 5 oz of wine, or 1 oz of hard liquor.  Try to get 7-9 hours of sleep each night. What other changes can be made?  Keep a food and activity journal to keep track of: ? What you ate and how many calories you had. Remember to count sauces, dressings, and side dishes. ? Whether you were active, and what exercises you did. ? Your calorie, weight, and activity goals.  Check your weight regularly. Track any changes. If you notice you have gained weight, make changes to your diet or activity routine.  Avoid taking weight-loss medicines or supplements. Talk to your health care provider before starting any new medicine or supplement.  Talk to your health care provider before trying any new diet or exercise plan. Why are these changes important? Eating healthy, staying active, and having healthy habits not only help prevent obesity, they also:  Help you to manage stress and emotions.  Help you to connect with friends and family.  Improve your self-esteem.  Improve your sleep.  Prevent long-term health problems.  What can happen if changes are not made? Being obese or overweight can cause you to develop joint or bone problems, which can  make it hard for you to stay active or do activities you enjoy. Being obese or overweight also puts stress on your heart and lungs and can lead to health problems like diabetes, heart disease, and some cancers. Where to find more information: Talk with your health care provider or a dietitian about healthy eating and healthy lifestyle choices. You may also find other information through these resources:  [place] Department of Agriculture MyPlate: FormerBoss.no  American Heart Association: www.heart.org  Centers for Disease Control and Prevention: http://www.wolf.info/  Summary  Staying at a healthy weight is important. It helps prevent certain diseases and health problems, such as heart disease, diabetes, joint problems, sleep disorders, and some cancers.  Being obese or overweight can cause you to develop joint or bone problems, which can make it hard for you to stay active or do activities you enjoy.  You can prevent unhealthy weight gain by eating a healthy diet, exercising regularly, not smoking, limiting alcohol, and getting enough sleep.  Talk with your health care provider or a dietitian for guidance about healthy eating and healthy lifestyle choices. This information is not intended to replace  advice given to you by your health care provider. Make sure you discuss any questions you have with your health care provider. Document Released: 06/07/2016 Document Revised: 07/13/2016 Document Reviewed: 07/13/2016 Elsevier Interactive Patient Education  2018 [employer].  Urinary Tract Infection, Adult A urinary tract infection (UTI) is an infection of any part of the urinary tract, which includes the kidneys, ureters, bladder, and urethra. These organs make, store, and get rid of urine in the body. UTI can be a bladder infection (cystitis) or kidney infection (pyelonephritis). What are the causes? This infection may be caused by fungi, viruses, or bacteria. Bacteria are the most common cause  of UTIs. This condition can also be caused by repeated incomplete emptying of the bladder during urination. What increases the risk? This condition is more likely to develop if:  You ignore your need to urinate or hold urine for long periods of time.  You do not empty your bladder completely during urination.  You wipe back to front after urinating or having a bowel movement, if you are female.  You are uncircumcised, if you are female.  You are constipated.  You have a urinary catheter that stays in place (indwelling).  You have a weak defense (immune) system.  You have a medical condition that affects your bowels, kidneys, or bladder.  You have diabetes.  You take antibiotic medicines frequently or for long periods of time, and the antibiotics no longer work well against certain types of infections (antibiotic resistance).  You take medicines that irritate your urinary tract.  You are exposed to chemicals that irritate your urinary tract.  You are female.  What are the signs or symptoms? Symptoms of this condition include:  Fever.  Frequent urination or passing small amounts of urine frequently.  Needing to urinate urgently.  Pain or burning with urination.  Urine that smells bad or unusual.  Cloudy urine.  Pain in the lower abdomen or back.  Trouble urinating.  Blood in the urine.  Vomiting or being less hungry than normal.  Diarrhea or abdominal pain.  Vaginal discharge, if you are female.  How is this diagnosed? This condition is diagnosed with a medical history and physical exam. You will also need to provide a urine sample to test your urine. Other tests may be done, including:  Blood tests.  Sexually transmitted disease (STD) testing.  If you have had more than one UTI, a cystoscopy or imaging studies may be done to determine the cause of the infections. How is this treated? Treatment for this condition often includes a combination of two or  more of the following:  Antibiotic medicine.  Other medicines to treat less common causes of UTI.  Over-the-counter medicines to treat pain.  Drinking enough water to stay hydrated.  Follow these instructions at home:  Take over-the-counter and prescription medicines only as told by your health care provider.  If you were prescribed an antibiotic, take it as told by your health care provider. Do not stop taking the antibiotic even if you start to feel better.  Avoid alcohol, caffeine, tea, and carbonated beverages. They can irritate your bladder.  Drink enough fluid to keep your urine clear or pale yellow.  Keep all follow-up visits as told by your health care provider. This is important.  Make sure to: ? Empty your bladder often and completely. Do not hold urine for long periods of time. ? Empty your bladder before and after sex. ? Wipe from front to back after a  bowel movement if you are female. Use each tissue one time when you wipe. Contact a health care provider if:  You have back pain.  You have a fever.  You feel nauseous or vomit.  Your symptoms do not get better after 3 days.  Your symptoms go away and then return. Get help right away if:  You have severe back pain or lower abdominal pain.  You are vomiting and cannot keep down any medicines or water. This information is not intended to replace advice given to you by your health care provider. Make sure you discuss any questions you have with your health care provider. Document Released: 03/16/2005 Document Revised: 11/18/2015 Document Reviewed: 04/27/2015 Elsevier Interactive Patient Education  [employer].

## 2017-12-14 LAB — CERVICOVAGINAL ANCILLARY ONLY
Bacterial vaginitis: POSITIVE — AB
Candida vaginitis: NEGATIVE
Chlamydia: NEGATIVE
NEISSERIA GONORRHEA: NEGATIVE
TRICH (WINDOWPATH): NEGATIVE

## 2017-12-15 ENCOUNTER — Telehealth: Payer: Self-pay

## 2017-12-15 ENCOUNTER — Other Ambulatory Visit: Payer: Self-pay | Admitting: Obstetrics

## 2017-12-15 DIAGNOSIS — B9689 Other specified bacterial agents as the cause of diseases classified elsewhere: Secondary | ICD-10-CM

## 2017-12-15 DIAGNOSIS — N760 Acute vaginitis: Secondary | ICD-10-CM

## 2017-12-15 LAB — CYTOLOGY - PAP
DIAGNOSIS: NEGATIVE
HPV (WINDOPATH): NOT DETECTED

## 2017-12-15 LAB — URINE CULTURE

## 2017-12-15 MED ORDER — TINIDAZOLE 500 MG PO TABS: 1000.0000 mg | ORAL_TABLET | Freq: Every day | ORAL | 2 refills | Status: DC

## 2017-12-15 NOTE — Telephone Encounter (Signed)
Attempted to contact about results and rx sent, no answer, left vm.

## 2017-12-15 NOTE — Telephone Encounter (Signed)
S/w patient, advised of results and rx

## 2017-12-16 ENCOUNTER — Other Ambulatory Visit: Payer: Self-pay | Admitting: Obstetrics

## 2017-12-16 DIAGNOSIS — N3001 Acute cystitis with hematuria: Secondary | ICD-10-CM

## 2017-12-16 MED ORDER — NITROFURANTOIN MONOHYD MACRO 100 MG PO CAPS: 100.0000 mg | ORAL_CAPSULE | Freq: Two times a day (BID) | ORAL | 0 refills | Status: DC

## 2017-12-19 ENCOUNTER — Other Ambulatory Visit: Payer: Self-pay

## 2017-12-19 DIAGNOSIS — N3001 Acute cystitis with hematuria: Secondary | ICD-10-CM

## 2017-12-19 LAB — COMPREHENSIVE METABOLIC PANEL
ALBUMIN: 4.3 g/dL (ref 3.5–5.5)
ALK PHOS: 72 IU/L (ref 39–117)
ALT: 17 IU/L (ref 0–32)
AST: 18 IU/L (ref 0–40)
Albumin/Globulin Ratio: 1.4 (ref 1.2–2.2)
BUN/Creatinine Ratio: 16 (ref 9–23)
BUN: 10 mg/dL (ref 6–24)
Bilirubin Total: 0.4 mg/dL (ref 0.0–1.2)
CALCIUM: 9.2 mg/dL (ref 8.7–10.2)
CO2: 22 mmol/L (ref 20–29)
CREATININE: 0.64 mg/dL (ref 0.57–1.00)
Chloride: 105 mmol/L (ref 96–106)
GFR calc Af Amer: 123 mL/min/{1.73_m2} (ref >59)
GFR, EST NON AFRICAN AMERICAN: 107 mL/min/{1.73_m2} (ref >59)
GLUCOSE: 83 mg/dL (ref 65–99)
Globulin, Total: 3.1 g/dL (ref 1.5–4.5)
Potassium: 4.4 mmol/L (ref 3.5–5.2)
Sodium: 142 mmol/L (ref 134–144)
Total Protein: 7.4 g/dL (ref 6.0–8.5)

## 2017-12-19 LAB — TSH: TSH: 2.73 u[IU]/mL (ref 0.450–4.500)

## 2017-12-19 LAB — CBC
HEMATOCRIT: 40.4 % (ref 34.0–46.6)
HEMOGLOBIN: 13.5 g/dL (ref 11.1–15.9)
MCH: 29.4 pg (ref 26.6–33.0)
MCHC: 33.4 g/dL (ref 31.5–35.7)
MCV: 88 fL (ref 79–97)
Platelets: 224 10*3/uL (ref 150–450)
RBC: 4.59 x10E6/uL (ref 3.77–5.28)
RDW: 14.6 % (ref 12.3–15.4)
WBC: 7.3 10*3/uL (ref 3.4–10.8)

## 2017-12-19 LAB — HEMOGLOBIN A1C
ESTIMATED AVERAGE GLUCOSE: 117 mg/dL
Hgb A1c MFr Bld: 5.7 % — ABNORMAL HIGH (ref 4.8–5.6)

## 2017-12-19 LAB — TRIGLYCERIDES: TRIGLYCERIDES: 118 mg/dL (ref 0–149)

## 2017-12-19 LAB — LDL CHOLESTEROL, DIRECT: LDL Direct: 145 mg/dL — ABNORMAL HIGH (ref 0–99)

## 2017-12-19 LAB — HDL CHOLESTEROL: HDL: 66 mg/dL (ref >39)

## 2017-12-19 LAB — FERRITIN: Ferritin: 29 ng/mL (ref 15–150)

## 2017-12-19 LAB — CHOLESTEROL, TOTAL: CHOLESTEROL TOTAL: 219 mg/dL — AB (ref 100–199)

## 2017-12-19 MED ORDER — NITROFURANTOIN MONOHYD MACRO 100 MG PO CAPS: 100.0000 mg | ORAL_CAPSULE | Freq: Two times a day (BID) | ORAL | 0 refills | Status: DC

## 2017-12-28 MED FILL — HUMIRA PEN *NO CITRATE*/40MG/0.4ML/PNKT: HUMIRA PEN *NO CITRATE*/40MG/0.4ML/PNKT | 28 days supply | Qty: 4 | Fill #5

## 2018-01-01 NOTE — Unmapped (Signed)
[facility] Pharmacy Refill and Clinical Coordination Note  Medication(s): Humira    Jasmine Mitchell, DOB: 23-Mar-1971  Phone: [phone] (home) , Alternate phone contact: N/A  Shipping address: [address]  [place] [address]  Phone or address changes today?: No  All above HIPAA information verified.  Insurance changes? No    Completed refill and clinical call assessment today to schedule patient's medication shipment from the [facility] [phone]).      MEDICATION RECONCILIATION    Confirmed the medication and dosage are correct and have not changed: Yes, regimen is correct and unchanged.    Were there any changes to your medication(s) in the past month:  No, there are no changes reported at this time.    ADHERENCE    Is this medicine transplant or covered by Medicare Part B? No.    Did you miss any doses in the past 4 weeks? No missed doses reported.  Adherence counseling provided? Not needed     SIDE EFFECT MANAGEMENT    Are you tolerating your medication?:  Trivia reports tolerating the medication.  Side effect management discussed: None      Therapy is appropriate and should be continued.    Evidence of clinical benefit: See Epic note from 12/08/17      FINANCIAL/SHIPPING    Delivery Scheduled: Yes, Expected medication delivery date: Friday, July 12   Additional medications refilled: No additional medications/refills needed at this time.    The patient will receive an FSI print out for each medication shipped and additional FDA Medication Guides as required.  Patient education from [facility] or [name] may also be included in the shipment.    Fallen did not have any additional questions at this time.    Delivery address validated in FSI scheduling system: Yes, address listed above is correct.      We will follow up with patient monthly for standard refill processing and delivery.      Thank you,  Tawanna Solo [facility] Specialty Pharmacist

## 2018-01-08 ENCOUNTER — Telehealth: Payer: Self-pay

## 2018-01-08 NOTE — Telephone Encounter (Signed)
TC from patient regarding recent hgb A1C results Pt states this morning she noticed she was shaky and weak pt asked if she needed to come in to have labs redrawn. I asked pt did she have a Primary Care provider? she confirmed she did.  However pt called to F/U with you please advise direction pt should take and if she should see her PCP.

## 2018-01-09 NOTE — Telephone Encounter (Signed)
Pt made aware to f/u with her pcp.  Pt agreeable.

## 2018-01-09 NOTE — Telephone Encounter (Signed)
Patient should see her PCP.  This is not a gyn problem.

## 2018-01-10 ENCOUNTER — Ambulatory Visit
Admission: RE | Admit: 2018-01-10 | Discharge: 2018-01-10 | Disposition: A | Discharge status: Home / Self Care | Payer: BLUE CROSS/BLUE SHIELD | Source: Ambulatory Visit | Attending: Obstetrics | Admitting: Obstetrics

## 2018-01-10 DIAGNOSIS — Z1231 Encounter for screening mammogram for malignant neoplasm of breast: Secondary | ICD-10-CM | POA: Diagnosis not present

## 2018-01-10 DIAGNOSIS — Z1239 Encounter for other screening for malignant neoplasm of breast: Secondary | ICD-10-CM

## 2018-01-15 ENCOUNTER — Telehealth: Payer: Self-pay

## 2018-01-15 NOTE — Telephone Encounter (Signed)
Patient needs referral to Urology

## 2018-01-15 NOTE — Telephone Encounter (Signed)
S/w pt and she stated that she has been through 2 rounds of antibiotics for UTI and symptoms were relieved for a few days, but have now returned. Pt reports that urine has strong ammonia smell and discomfort with urination.  Advised that I would route to provider for review.

## 2018-01-16 ENCOUNTER — Other Ambulatory Visit: Payer: Self-pay | Admitting: Obstetrics

## 2018-01-16 ENCOUNTER — Telehealth: Payer: Self-pay

## 2018-01-16 DIAGNOSIS — N390 Urinary tract infection, site not specified: Secondary | ICD-10-CM

## 2018-01-16 NOTE — Telephone Encounter (Signed)
S/w pt and advised that provider sent referral for urology.

## 2018-01-18 ENCOUNTER — Other Ambulatory Visit: Payer: Self-pay | Admitting: Obstetrics

## 2018-01-18 ENCOUNTER — Telehealth: Payer: Self-pay

## 2018-01-18 DIAGNOSIS — N390 Urinary tract infection, site not specified: Secondary | ICD-10-CM

## 2018-01-18 MED ORDER — CIPROFLOXACIN HCL 500 MG PO TABS: 500.0000 mg | ORAL_TABLET | Freq: Two times a day (BID) | ORAL | 0 refills | Status: DC

## 2018-01-18 NOTE — Telephone Encounter (Signed)
Advised pt that provider sent another referral.

## 2018-01-25 NOTE — Unmapped (Signed)
[facility] Refill Coordination Note  Medication: Humira 40mg /0.28ml    Unable to reach patient to schedule shipment for medication being filled at [facility]. Left voicemail on phone.  As this is the 3rd unsuccessful attempt to reach the patient, no additional phone call attempts will be made at this time.      Phone numbers attempted: [phone]  Dates called: 01/16/18; 01/19/18; 01/25/18  Last scheduled delivery: 12/29/17    Please call the [facility] at [phone] (option [phone]) should you have any further questions.      Thanks,  [facility] Specialty Team

## 2018-01-30 NOTE — Unmapped (Signed)
Called pt as notified by pharmacy that she has not filled her Humira. Left VM for pt to call [facility] to set up delivery of Humira asap. Phone number provided for pharmacy and RN

## 2018-02-05 MED FILL — HUMIRA PEN CITRATE FREE 40 MG/0.4 ML: 28 days supply | Qty: 4 | Fill #0 | Status: AC

## 2018-02-05 MED FILL — HUMIRA PEN CITRATE FREE 40 MG/0.4 ML: 28 days supply | Qty: 4 | Fill #0

## 2018-02-05 NOTE — Unmapped (Signed)
[facility] Refill and Clinical Coordination Note  Medication(s): Humira 40mg /0.33ml    Jasmine Mitchell, DOB: 23-Nov-1970  Phone: [phone] (home) , Alternate phone contact: N/A  Shipping address: [address]  [place] [address]  Phone or address changes today?: No  All above HIPAA information verified.  Insurance changes? No    Completed refill and clinical call assessment today to schedule patient's medication shipment from the [facility] [phone]).      MEDICATION RECONCILIATION    Confirmed the medication and dosage are correct and have not changed: Yes, regimen is correct and unchanged.    Were there any changes to your medication(s) in the past month:  No, there are no changes reported at this time.    ADHERENCE    Is this medicine transplant or covered by Medicare Part B? No.    Did you miss any doses in the past 4 weeks? No missed doses reported.  Adherence counseling provided? Not needed     SIDE EFFECT MANAGEMENT    Are you tolerating your medication?:  Romelia reports tolerating the medication.  Side effect management discussed: None      Therapy is appropriate and should be continued.    Evidence of clinical benefit: See Epic note from 12/08/2017      FINANCIAL/SHIPPING    Delivery Scheduled: Yes, Expected medication delivery date: 02/06/2018     Additional medications refilled: No additional medications/refills needed at this time.    The patient will receive a drug information handout for each medication shipped and additional FDA Medication Guides as required.      Emilie did not have any additional questions at this time.    Delivery address confirmed in Epic.     We will follow up with patient monthly for standard refill processing and delivery.      Thank you,  Samay Delcarlo  Anders Grant   [facility] Specialty Pharmacist

## 2018-02-22 ENCOUNTER — Encounter: Payer: Self-pay | Admitting: Family Medicine

## 2018-02-22 ENCOUNTER — Encounter

## 2018-02-22 ENCOUNTER — Ambulatory Visit (INDEPENDENT_AMBULATORY_CARE_PROVIDER_SITE_OTHER): Payer: BLUE CROSS/BLUE SHIELD

## 2018-02-22 ENCOUNTER — Ambulatory Visit: Payer: BLUE CROSS/BLUE SHIELD | Admitting: Family Medicine

## 2018-02-22 VITALS — BP 124/80 | HR 91 | Temp 97.9°F | Ht 65.0 in | Wt 233.4 lb

## 2018-02-22 DIAGNOSIS — R05 Cough: Secondary | ICD-10-CM

## 2018-02-22 DIAGNOSIS — R059 Cough, unspecified: Secondary | ICD-10-CM

## 2018-02-22 DIAGNOSIS — J029 Acute pharyngitis, unspecified: Secondary | ICD-10-CM

## 2018-02-22 DIAGNOSIS — J989 Respiratory disorder, unspecified: Secondary | ICD-10-CM | POA: Diagnosis not present

## 2018-02-22 LAB — POCT RAPID STREP A (OFFICE): RAPID STREP A SCREEN: NEGATIVE

## 2018-02-22 MED ORDER — BENZONATATE 100 MG PO CAPS: 100.0000 mg | ORAL_CAPSULE | Freq: Three times a day (TID) | ORAL | 0 refills | Status: DC | PRN

## 2018-02-22 NOTE — Patient Instructions (Signed)
BEFORE YOU LEAVE: -xray -rapid strep test -follow up: as needed  Tessalon for cough   INSTRUCTIONS FOR UPPER RESPIRATORY INFECTION:  -plenty of rest and fluids  -nasal saline wash 2-3 times daily (use prepackaged nasal saline or bottled/distilled water if making your own)   -if you are taking a cough medication - use only as directed, may also try a teaspoon of honey to coat the throat and throat lozenge. Tessalon as needed  -for sore throat, salt water gargles can help  -follow up if you have fevers, facial pain, tooth pain, difficulty breathing or are worsening or symptoms persist longer then expected  Upper Respiratory Infection, Adult An upper respiratory infection (URI) is also known as the common cold. It is often caused by a type of germ (virus). Colds are easily spread (contagious). You can pass it to others by kissing, coughing, sneezing, or drinking out of the same glass. Usually, you get better in 1 to 3  weeks.  However, the cough can last for even longer. HOME CARE   Only take medicine as told by your doctor. Follow instructions provided above.  Drink enough water and fluids to keep your pee (urine) clear or pale yellow.  Get plenty of rest.  Return to work when your temperature is < 100 for 24 hours or as told by your doctor. You may use a face mask and wash your hands to stop your cold from spreading. GET HELP RIGHT AWAY IF:   After the first few days, you feel you are getting worse.  You have questions about your medicine.  You have chills, shortness of breath, or red spit (mucus).  You have pain in the face for more then 1-2 days, especially when you bend forward.  You have a fever, puffy (swollen) neck, pain when you swallow, or white spots in the back of your throat.  You have a bad headache, ear pain, sinus pain, or chest pain.  You have a high-pitched whistling sound when you breathe in and out (wheezing).  You cough up blood.  You have sore  muscles or a stiff neck. MAKE SURE YOU:   Understand these instructions.  Will watch your condition.  Will get help right away if you are not doing well or get worse. Document Released: 11/23/2007 Document Revised: 08/29/2011 Document Reviewed: 09/11/2013 [facility] Patient Information 2015 [place], [place]. This information is not intended to replace advice given to you by your health care provider. Make sure you discuss any questions you have with your health care provider.

## 2018-02-22 NOTE — Progress Notes (Signed)
HPI:  Using dictation device. Unfortunately this device frequently misinterprets words/phrases.   Acute visit for respiratory illness: -started: 1 week ago -symptoms:nasal congestion, sore throat, cough, lots of drainage in throat - sometimes blood tinged, emesis from gagging on mucus - overall improving, but still with cough  -denies:fever, SOB, NVD, tooth pain, sinus pain -has tried: nothing -sick contacts/travel/risks: no reported flu, strep or tick exposure -Hx of: on humira, immunosup, hx pneumonia ROS: See pertinent positives and negatives per HPI.  Past Medical History:  Diagnosis Date  . ADD (attention deficit disorder)   . Anxiety   . Cholesterol serum elevated    on medication in the past  . Chronic kidney disease   . Crohn's disease of colon with fistula ([place])    sees Dr. Coralyn Mark, [facility]  . Depression   . Fibromyalgia - managed by Dr. Dossie Der, [facility] - takes Neurontin and Tramadol 11/20/2013  . Frequent headaches   . GERD (gastroesophageal reflux disease)   . Osteoarthritis    knee, spine  . Pneumonia   . Syncope     Past Surgical History:  Procedure Laterality Date  . anal fistula repair      X 4   . colonoscopy with dilation     X 2   . FRACTURE SURGERY    . TUBAL LIGATION      Family History  Problem Relation Age of Onset  . Hypertension Mother   . ADD / ADHD Mother   . Anxiety disorder Mother   . Arthritis Mother   . Depression Mother   . Varicose Veins Mother   . Diabetes Father   . Hearing loss Father   . Heart disease Father   . Cancer Sister        cervical  . ADD / ADHD Sister   . Anxiety disorder Sister   . Arthritis Sister   . Depression Sister   . Early death Sister   . Varicose Veins Sister   . Mental illness Brother        drugs and alcohol abuse  . ADD / ADHD Brother   . Alcohol abuse Brother   . Asthma Brother   . Depression Brother   . Drug abuse Brother   . ADD / ADHD Daughter     Social History    Socioeconomic History  . Marital status: Married    Spouse name: Not on file  . Number of children: Not on file  . Years of education: Not on file  . Highest education level: Not on file  Occupational History  . Not on file  Social Needs  . Financial resource strain: Not on file  . Food insecurity:    Worry: Not on file    Inability: Not on file  . Transportation needs:    Medical: Not on file    Non-medical: Not on file  Tobacco Use  . Smoking status: Never Smoker  . Smokeless tobacco: Never Used  Substance and Sexual Activity  . Alcohol use: Yes    Alcohol/week: 0.0 standard drinks    Comment: ocassionally  . Drug use: No  . Sexual activity: Yes  Lifestyle  . Physical activity:    Days per week: Not on file    Minutes per session: Not on file  . Stress: Not on file  Relationships  . Social connections:    Talks on phone: Not on file    Gets together: Not on file    Attends religious  service: Not on file    Active member of club or organization: Not on file    Attends meetings of clubs or organizations: Not on file    Relationship status: Not on file  Other Topics Concern  . Not on file  Social History Narrative   Work or School: on disability for disability related to Crohn's and managed by Dr. Coralyn Mark; works part time      Home Situation: lives with boyfriend and son (40 yo) in 2015      Spiritual Beliefs: Christian      Lifestyle: regular exercise - walking 3x per week; diet is healthy              Current Outpatient Medications:  .  acetaminophen (TYLENOL) 500 MG tablet, Take 1,000 mg by mouth every 6 (six) hours as needed. For migraines and stomach upset , Disp: , Rfl:  .  Adalimumab (HUMIRA) 40 MG/0.8ML PSKT, Inject 40 mg into the skin once a week., Disp: , Rfl:  .  cefUROXime (CEFTIN) 500 MG tablet, Take 1 tablet (500 mg total) by mouth 2 (two) times daily with a meal., Disp: 14 tablet, Rfl: 2 .  desipramine (NORPRAMIN) 25 MG tablet, Take by  mouth., Disp: , Rfl:  .  dicyclomine (BENTYL) 10 MG capsule, Take 10 mg by mouth 2 (two) times daily., Disp: , Rfl:  .  fluticasone (FLONASE) 50 MCG/ACT nasal spray, Place 1 spray into both nostrils daily., Disp: 48 g, Rfl: 0 .  ondansetron (ZOFRAN ODT) 8 MG disintegrating tablet, Take 1 tablet (8 mg total) by mouth every 8 (eight) hours as needed for nausea or vomiting., Disp: 30 tablet, Rfl: 0 .  prenatal vitamin w/FE, FA (PRENATAL 1 + 1) 27-1 MG TABS tablet, Take 1 tablet by mouth daily before breakfast., Disp: 30 each, Rfl: 11 .  tinidazole (TINDAMAX) 500 MG tablet, Take 2 tablets (1,000 mg total) by mouth daily with breakfast., Disp: 10 tablet, Rfl: 2 .  benzonatate (TESSALON PERLES) 100 MG capsule, Take 1 capsule (100 mg total) by mouth 3 (three) times daily as needed., Disp: 20 capsule, Rfl: 0  EXAM:  Vitals:   02/22/18 1533  BP: 124/80  Pulse: 91  Temp: 97.9 F (36.6 C)  SpO2: 98%    Body mass index is 38.84 kg/m.  GENERAL: vitals reviewed and listed above, alert, oriented, appears well hydrated and in no acute distress  HEENT: atraumatic, conjunttiva clear, no obvious abnormalities on inspection of external nose and ears, normal appearance of ear canals and TMs, clear nasal congestion, mild post oropharyngeal erythema with PND, 2+ tonsillar edema, no exudate, no sinus TTP  NECK: no obvious masses on inspection  LUNGS: clear to auscultation bilaterally, no wheezes, rales or rhonchi, good air movement  CV: HRRR, no peripheral edema  MS: moves all extremities without noticeable abnormality  PSYCH: pleasant and cooperative, no obvious depression or anxiety  ASSESSMENT AND PLAN:  Discussed the following assessment and plan:  Cough - Plan: DG Chest 2 View  Respiratory illness  Sore throat  -. We discussed potential etiologies, with VURI being most likely, however given her history low threshold to start abx if not continuing to improve and also will get CXR and strep  test today. Tessalon for cough. We discussed treatment side effects, likely course, antibiotic misuse, transmission, and signs of developing a serious illness. -of course, we advised to return or notify a doctor immediately if symptoms worsen or persist or new concerns arise.  Patient Instructions  BEFORE YOU LEAVE: -xray -rapid strep test -follow up: as needed  Tessalon for cough   INSTRUCTIONS FOR UPPER RESPIRATORY INFECTION:  -plenty of rest and fluids  -nasal saline wash 2-3 times daily (use prepackaged nasal saline or bottled/distilled water if making your own)   -if you are taking a cough medication - use only as directed, may also try a teaspoon of honey to coat the throat and throat lozenge. Tessalon as needed  -for sore throat, salt water gargles can help  -follow up if you have fevers, facial pain, tooth pain, difficulty breathing or are worsening or symptoms persist longer then expected  Upper Respiratory Infection, Adult An upper respiratory infection (URI) is also known as the common cold. It is often caused by a type of germ (virus). Colds are easily spread (contagious). You can pass it to others by kissing, coughing, sneezing, or drinking out of the same glass. Usually, you get better in 1 to 3  weeks.  However, the cough can last for even longer. HOME CARE   Only take medicine as told by your doctor. Follow instructions provided above.  Drink enough water and fluids to keep your pee (urine) clear or pale yellow.  Get plenty of rest.  Return to work when your temperature is < 100 for 24 hours or as told by your doctor. You may use a face mask and wash your hands to stop your cold from spreading. GET HELP RIGHT AWAY IF:   After the first few days, you feel you are getting worse.  You have questions about your medicine.  You have chills, shortness of breath, or red spit (mucus).  You have pain in the face for more then 1-2 days, especially when you bend  forward.  You have a fever, puffy (swollen) neck, pain when you swallow, or white spots in the back of your throat.  You have a bad headache, ear pain, sinus pain, or chest pain.  You have a high-pitched whistling sound when you breathe in and out (wheezing).  You cough up blood.  You have sore muscles or a stiff neck. MAKE SURE YOU:   Understand these instructions.  Will watch your condition.  Will get help right away if you are not doing well or get worse. Document Released: 11/23/2007 Document Revised: 08/29/2011 Document Reviewed: 09/11/2013 [facility] Patient Information 2015 [place], [place]. This information is not intended to replace advice given to you by your health care provider. Make sure you discuss any questions you have with your health care provider.     Lucretia Kern, DO

## 2018-02-22 NOTE — Addendum Note (Signed)
Addended by: Agnes Lawrence on: 02/22/2018 04:08 PM   Modules accepted: Orders

## 2018-02-26 NOTE — Unmapped (Signed)
[facility] Refill Coordination Note  Specialty Medication(s): humira 40mg /0.4  Additional Medications shipped:      Bruchy Mikel, DOB: 09/01/1970  Phone: [phone] (home) , Alternate phone contact: N/A  Phone or address changes today?: No  All above HIPAA information was verified with patient.  Shipping Address: [address] [name]  [place] [address]   Insurance changes? No    Completed refill call assessment today to schedule patient's medication shipment from the [facility] [phone]).      Confirmed the medication and dosage are correct and have not changed: Yes, regimen is correct and unchanged.    Confirmed patient started or stopped the following medications in the past month:  No, there are no changes reported at this time.    Are you tolerating your medication?:  Shery reports tolerating the medication.    ADHERENCE    (Below is required for Medicare Part B or Transplant patients only - per drug):   How many tablets were dispensed last month: 4 pen  Patient currently has 1 pen remaining.    Did you miss any doses in the past 4 weeks? No missed doses reported.    FINANCIAL/SHIPPING    Delivery Scheduled: Yes, Expected medication delivery date: 091819     The patient will receive a drug information handout for each medication shipped and additional FDA Medication Guides as required.      Vasilia did not have any additional questions at this time.    Delivery address validated in Epic.    We will follow up with patient monthly for standard refill processing and delivery.      Thank you,  [name]   [facility] Specialty Technician

## 2018-02-28 MED ORDER — ADALIMUMAB PEN CITRATE FREE 40 MG/0.4 ML
6 refills | 0 days | Status: CP
Start: 2018-02-28 — End: 2018-09-27
  Filled 2018-03-06: qty 4, 28d supply, fill #0

## 2018-02-28 NOTE — Unmapped (Signed)
Humira maintenance dose refill sent to [facility]

## 2018-03-06 MED FILL — HUMIRA PEN CITRATE FREE 40 MG/0.4 ML: 28 days supply | Qty: 4 | Fill #0 | Status: AC

## 2018-03-22 ENCOUNTER — Encounter: Payer: Self-pay | Admitting: Family Medicine

## 2018-03-22 ENCOUNTER — Ambulatory Visit: Payer: BLUE CROSS/BLUE SHIELD | Admitting: Family Medicine

## 2018-03-22 VITALS — BP 120/80 | HR 85 | Temp 98.1°F | Ht 65.0 in | Wt 229.2 lb

## 2018-03-22 DIAGNOSIS — R300 Dysuria: Secondary | ICD-10-CM | POA: Diagnosis not present

## 2018-03-22 DIAGNOSIS — N390 Urinary tract infection, site not specified: Secondary | ICD-10-CM | POA: Diagnosis not present

## 2018-03-22 DIAGNOSIS — Z3200 Encounter for pregnancy test, result unknown: Secondary | ICD-10-CM

## 2018-03-22 DIAGNOSIS — Z23 Encounter for immunization: Secondary | ICD-10-CM | POA: Diagnosis not present

## 2018-03-22 DIAGNOSIS — N938 Other specified abnormal uterine and vaginal bleeding: Secondary | ICD-10-CM | POA: Diagnosis not present

## 2018-03-22 LAB — POCT URINALYSIS DIPSTICK
Bilirubin, UA: NEGATIVE
GLUCOSE UA: NEGATIVE
Ketones, UA: NEGATIVE
NITRITE UA: NEGATIVE
PROTEIN UA: NEGATIVE
SPEC GRAV UA: 1.02 (ref 1.010–1.025)
Urobilinogen, UA: 0.2 E.U./dL
pH, UA: 5 (ref 5.0–8.0)

## 2018-03-22 LAB — URINALYSIS, MICROSCOPIC ONLY

## 2018-03-22 LAB — POCT URINE PREGNANCY: PREG TEST UR: NEGATIVE

## 2018-03-22 MED ORDER — NITROFURANTOIN MONOHYD MACRO 100 MG PO CAPS: 100.0000 mg | ORAL_CAPSULE | Freq: Two times a day (BID) | ORAL | 0 refills | Status: DC

## 2018-03-22 NOTE — Patient Instructions (Addendum)
BEFORE YOU LEAVE: -flu shot, upreg, ua with reflex micro and culture if abnormal -follow up: 3 months  See urology as planned.  I hope you are feeling better soon! Seek care promptly if your symptoms worsen, new concerns arise or you are not improving with treatment.   We have ordered labs or studies at this visit. It can take up to 1-2 weeks for results and processing. IF results require follow up or explanation, we will call you with instructions. Clinically stable results will be released to your [facility]. If you have not heard from [place] or cannot find your results in [facility] in 2 weeks please contact our office at [phone].  If you are not yet signed up for [facility], please consider signing up.

## 2018-03-22 NOTE — Addendum Note (Signed)
Addended by: Agnes Lawrence on: 03/22/2018 05:11 PM   Modules accepted: Orders

## 2018-03-22 NOTE — Progress Notes (Signed)
HPI:  Using dictation device. Unfortunately this device frequently misinterprets words/phrases.  Acute visit for dysuria: -This started over the last 1 week -Reports she has a history of recurrent UTIs with 3 UTIs over the summer, managed by her gynecologist -Reports she will be seeing a urologist in 2 weeks about this, but she could not wait until that appointment for the symptoms -Symptoms include bladder discomfort with urination, frequency and urgency -Denies fevers, malaise, flank pain, vomiting, nausea, vaginal symptoms or diarrhea -Reports globin A1c checked recent with her gynecologist prediabetes -Reports does not drink enough water -Seeing GYN also for dysfunctional uterine bleeding, first day last menstrual period was in July  ROS: See pertinent positives and negatives per HPI.  Past Medical History:  Diagnosis Date  . ADD (attention deficit disorder)   . Anxiety   . Cholesterol serum elevated    on medication in the past  . Chronic kidney disease   . Crohn's disease of colon with fistula ([place])    sees Dr. Coralyn Mark, [facility]  . Depression   . Fibromyalgia - managed by Dr. Dossie Der, [facility] - takes Neurontin and Tramadol 11/20/2013  . Frequent headaches   . GERD (gastroesophageal reflux disease)   . Osteoarthritis    knee, spine  . Pneumonia   . Syncope     Past Surgical History:  Procedure Laterality Date  . anal fistula repair      X 4   . colonoscopy with dilation     X 2   . FRACTURE SURGERY    . TUBAL LIGATION      Family History  Problem Relation Age of Onset  . Hypertension Mother   . ADD / ADHD Mother   . Anxiety disorder Mother   . Arthritis Mother   . Depression Mother   . Varicose Veins Mother   . Diabetes Father   . Hearing loss Father   . Heart disease Father   . Cancer Sister        cervical  . ADD / ADHD Sister   . Anxiety disorder Sister   . Arthritis Sister   . Depression Sister   . Early death Sister   . Varicose Veins Sister    . Mental illness Brother        drugs and alcohol abuse  . ADD / ADHD Brother   . Alcohol abuse Brother   . Asthma Brother   . Depression Brother   . Drug abuse Brother   . ADD / ADHD Daughter     SOCIAL HX: See HPI   Current Outpatient Medications:  .  acetaminophen (TYLENOL) 500 MG tablet, Take 1,000 mg by mouth every 6 (six) hours as needed. For migraines and stomach upset , Disp: , Rfl:  .  Adalimumab (HUMIRA) 40 MG/0.8ML PSKT, Inject 40 mg into the skin once a week., Disp: , Rfl:  .  benzonatate (TESSALON PERLES) 100 MG capsule, Take 1 capsule (100 mg total) by mouth 3 (three) times daily as needed., Disp: 20 capsule, Rfl: 0 .  cefUROXime (CEFTIN) 500 MG tablet, Take 1 tablet (500 mg total) by mouth 2 (two) times daily with a meal., Disp: 14 tablet, Rfl: 2 .  desipramine (NORPRAMIN) 25 MG tablet, Take by mouth., Disp: , Rfl:  .  dicyclomine (BENTYL) 10 MG capsule, Take 10 mg by mouth 2 (two) times daily., Disp: , Rfl:  .  fluticasone (FLONASE) 50 MCG/ACT nasal spray, Place 1 spray into both nostrils daily., Disp: 48 g,  Rfl: 0 .  ondansetron (ZOFRAN ODT) 8 MG disintegrating tablet, Take 1 tablet (8 mg total) by mouth every 8 (eight) hours as needed for nausea or vomiting., Disp: 30 tablet, Rfl: 0 .  prenatal vitamin w/FE, FA (PRENATAL 1 + 1) 27-1 MG TABS tablet, Take 1 tablet by mouth daily before breakfast., Disp: 30 each, Rfl: 11 .  tinidazole (TINDAMAX) 500 MG tablet, Take 2 tablets (1,000 mg total) by mouth daily with breakfast., Disp: 10 tablet, Rfl: 2  EXAM:  Vitals:   03/22/18 1401  BP: 120/80  Pulse: 85  Temp: 98.1 F (36.7 C)    Body mass index is 38.14 kg/m.  GENERAL: vitals reviewed and listed above, alert, oriented, appears well hydrated and in no acute distress  HEENT: atraumatic, conjunttiva clear, no obvious abnormalities on inspection of external nose and ears  NECK: no obvious masses on inspection  LUNGS: clear to auscultation bilaterally, no  wheezes, rales or rhonchi, good air movement  CV: HRRR, no peripheral edema  ABD: Soft, nontender, no CVA tenderness  MS: moves all extremities without noticeable abnormality  PSYCH: pleasant and cooperative, no obvious depression or anxiety  ASSESSMENT AND PLAN:  Discussed the following assessment and plan:  Dysuria  DUB (dysfunctional uterine bleeding)  Recurrent UTI  -Check urine studies today -treat appropriately -Advised plenty of water, urinating after intercourse, low sugar healthy diet, loosefitting close and no baths -See urologist as planned -Given dysfunctional uterine bleeding with no.  In several months, she is seeing gynecology for this -Wants her flu shot today -Patient advised to return or notify a doctor immediately if symptoms worsen or persist or new concerns arise.  Patient Instructions  BEFORE YOU LEAVE: -flu shot, upreg, ua with reflex micro and culture if abnormal -follow up: 3 months      Lucretia Kern, DO

## 2018-03-24 LAB — URINE CULTURE
MICRO NUMBER: 91189940
SPECIMEN QUALITY: ADEQUATE

## 2018-03-26 ENCOUNTER — Telehealth: Payer: Self-pay | Admitting: *Deleted

## 2018-03-26 NOTE — Telephone Encounter (Signed)
See results note.  Copied from [place] [id number]. Topic: General - Other >> Mar 26, 2018 11:07 AM Cecelia Byars, NT wrote: Reason for CRM: Patient returned call from [place] and would like a call back

## 2018-03-27 MED ORDER — NITROFURANTOIN MONOHYD MACRO 100 MG PO CAPS: 100.0000 mg | ORAL_CAPSULE | Freq: Two times a day (BID) | ORAL | 0 refills | Status: DC

## 2018-03-27 NOTE — Addendum Note (Signed)
Addended by: Agnes Lawrence on: 03/27/2018 09:41 AM   Modules accepted: Orders

## 2018-04-04 DIAGNOSIS — N390 Urinary tract infection, site not specified: Secondary | ICD-10-CM | POA: Diagnosis not present

## 2018-04-04 DIAGNOSIS — K50919 Crohn's disease, unspecified, with unspecified complications: Secondary | ICD-10-CM | POA: Diagnosis not present

## 2018-04-05 NOTE — Unmapped (Signed)
Called pt as notified by [facility] that pt has not scheduled delivery of Humira. Left VM for pt today with [facility] phone number encouraging her to call so she does not have any delay in therapy. Also provided RN phone number if she has any questions.

## 2018-04-05 NOTE — Unmapped (Signed)
[facility] Refill Coordination Note  Medication: Humira 40mg /0.11ml    Unable to reach patient to schedule shipment for medication being filled at [facility]. Left voicemail on phone.  As this is the 3rd unsuccessful attempt to reach the patient, no additional phone call attempts will be made at this time.      Phone numbers attempted: [phone]  Last scheduled delivery: [id number]    Please call the [facility] at [phone] (option 4) should you have any further questions.      Thanks,  [facility] Specialty Team

## 2018-04-06 NOTE — Unmapped (Signed)
[facility] Refill Coordination Note  Specialty Medication(s): Humira 40mg /ml Pen  Additional Medications shipped: none    Jasmine Mitchell, DOB: [birth year]  Phone: [phone] (home) , Alternate phone contact: N/A  Phone or address changes today?: No  All above HIPAA information was verified with patient.  Shipping Address: [address] [name]  [place] [address]   Insurance changes? No    Completed refill call assessment today to schedule patient's medication shipment from the [facility] [phone]).      Confirmed the medication and dosage are correct and have not changed: Yes, regimen is correct and unchanged.    Confirmed patient started or stopped the following medications in the past month:  No, there are no changes reported at this time.    Are you tolerating your medication?:  Jasmine Mitchell reports tolerating the medication.    ADHERENCE    (Below is required for Medicare Part B or Transplant patients only - per drug):   How many tablets were dispensed last month: 4 pens  Patient currently has 0 pens remaining.    Did you miss any doses in the past 4 weeks? Yes.  Jasmine Mitchell reports missing 1 days of medication therapy in the last 4 weeks.  Jasmine Mitchell reports have not had chance to call the pharmacy back as the cause of their non-adherance.    FINANCIAL/SHIPPING    Delivery Scheduled: Yes, Expected medication delivery date: 04/10/18     Medication will be delivered via UPS to the home address in [facility].    The patient will receive a drug information handout for each medication shipped and additional FDA Medication Guides as required.      Jasmine Mitchell did not have any additional questions at this time.    We will follow up with patient monthly for standard refill processing and delivery.      Thank you,  Roderic Palau   [facility] Shared [facility] Specialty Pharmacist

## 2018-04-09 MED FILL — HUMIRA PEN CITRATE FREE 40 MG/0.4 ML: 28 days supply | Qty: 4 | Fill #1 | Status: AC

## 2018-04-09 MED FILL — HUMIRA PEN CITRATE FREE 40 MG/0.4 ML: 28 days supply | Qty: 4 | Fill #1

## 2018-04-17 MED ORDER — DICYCLOMINE 10 MG CAPSULE
ORAL_CAPSULE | 3 refills | 0 days | Status: CP
Start: 2018-04-17 — End: ?

## 2018-04-18 ENCOUNTER — Ambulatory Visit: Payer: Self-pay

## 2018-04-18 NOTE — Telephone Encounter (Signed)
Patient called in with c/o "dizziness." She says "this has been going on for several weeks, off and on. I feel off balance and the room spins. I am still able to walk, but at times, it stopped me in my tracks. I've noticed it when I stand, when I eat, especially eating sweets. I checked my BP, which I don't have BP problems, and it was 145/83." I asked about other symptoms, she says "I've had cramps in my legs/feet and pain in my feet, but nothing else." According to protocol, see PCP within 3 days, appointment scheduled for tomorrow at 0830 with Dr. Maudie Mercury, care advice given, patient verbalized understanding.  Reason for Disposition . [1] MILD dizziness (e.g., vertigo; walking normally) AND [2] has NOT been evaluated by physician for this  Answer Assessment - Initial Assessment Questions 1. DESCRIPTION: "Describe your dizziness."     Room spinning, out of balance 2. VERTIGO: "Do you feel like either you or the room is spinning or tilting?"      Yes 3. LIGHTHEADED: "Do you feel lightheaded?" (e.g., somewhat faint, woozy, weak upon standing)     Yes 4. SEVERITY: "How bad is it?"  "Can you walk?"   - MILD - Feels unsteady but walking normally.   - MODERATE - Feels very unsteady when walking, but not falling; interferes with normal activities (e.g., school, work) .   - SEVERE - Unable to walk without falling (requires assistance).     Mild-Moderate at times 5. ONSET:  "When did the dizziness begin?"     Off and on several weeks 6. AGGRAVATING FACTORS: "Does anything make it worse?" (e.g., standing, change in head position)     Standing, after eating it's happened a couple of times 7. CAUSE: "What do you think is causing the dizziness?"     I don't know 8. RECURRENT SYMPTOM: "Have you had dizziness before?" If so, ask: "When was the last time?" "What happened that time?"     Yes, when dehydrated years ago 9. OTHER SYMPTOMS: "Do you have any other symptoms?" (e.g., headache, weakness, numbness,  vomiting, earache)     Cramps feet/legs, sharp pains feet 10. PREGNANCY: "Is there any chance you are pregnant?" "When was your last menstrual period?"       No  Protocols used: DIZZINESS - VERTIGO-A-AH

## 2018-04-18 NOTE — Progress Notes (Signed)
HPI:  Using dictation device. Unfortunately this device frequently misinterprets words/phrases.  Katrina Grant is a pleasant 47 yo with a PMH of Anxiety, ADD, chrohn's, Depression, fibromyalgia, morbid obesity, hyperlipidemia, DUB, frequent headaches and a hx of syncope here for an acute visit for vertigo: -intermittent for several weeks -brief spells of room spinning, very brief - longest 30 seconds, sometimes notices when goes to stand up -lots of anxiety/stress - work related - BP elevated when she checked it at 145/83 -no fevers, malaise, SOB, palp, hearing loss, sinus pain, syncope, Ha, vision changes, depression, hallucinations, severe anxiety, thoughts of self-harm or other symptoms  ROS: See pertinent positives and negatives per HPI.  Past Medical History:  Diagnosis Date  . ADD (attention deficit disorder)   . Anxiety   . Cholesterol serum elevated    on medication in the past  . Chronic kidney disease   . Crohn's disease of colon with fistula ([place])    sees Dr. Coralyn Mark, [facility]  . Depression   . Fibromyalgia - managed by Dr. Dossie Der, [facility] - takes Neurontin and Tramadol 11/20/2013  . Frequent headaches   . GERD (gastroesophageal reflux disease)   . Osteoarthritis    knee, spine  . Pneumonia   . Syncope     Past Surgical History:  Procedure Laterality Date  . anal fistula repair      X 4   . colonoscopy with dilation     X 2   . FRACTURE SURGERY    . TUBAL LIGATION      Family History  Problem Relation Age of Onset  . Hypertension Mother   . ADD / ADHD Mother   . Anxiety disorder Mother   . Arthritis Mother   . Depression Mother   . Varicose Veins Mother   . Diabetes Father   . Hearing loss Father   . Heart disease Father   . Cancer Sister        cervical  . ADD / ADHD Sister   . Anxiety disorder Sister   . Arthritis Sister   . Depression Sister   . Early death Sister   . Varicose Veins Sister   . Mental illness Brother        drugs and  alcohol abuse  . ADD / ADHD Brother   . Alcohol abuse Brother   . Asthma Brother   . Depression Brother   . Drug abuse Brother   . ADD / ADHD Daughter     SOCIAL HX: see hpi    Current Outpatient Medications:  .  acetaminophen (TYLENOL) 500 MG tablet, Take 1,000 mg by mouth every 6 (six) hours as needed. For migraines and stomach upset , Disp: , Rfl:  .  Adalimumab (HUMIRA) 40 MG/0.8ML PSKT, Inject 40 mg into the skin once a week., Disp: , Rfl:  .  desipramine (NORPRAMIN) 25 MG tablet, Take by mouth., Disp: , Rfl:  .  dicyclomine (BENTYL) 10 MG capsule, Take 10 mg by mouth 2 (two) times daily., Disp: , Rfl:  .  fluticasone (FLONASE) 50 MCG/ACT nasal spray, Place 1 spray into both nostrils daily., Disp: 48 g, Rfl: 0 .  ondansetron (ZOFRAN ODT) 8 MG disintegrating tablet, Take 1 tablet (8 mg total) by mouth every 8 (eight) hours as needed for nausea or vomiting., Disp: 30 tablet, Rfl: 0 .  prenatal vitamin w/FE, FA (PRENATAL 1 + 1) 27-1 MG TABS tablet, Take 1 tablet by mouth daily before breakfast., Disp: 30 each, Rfl:  11  EXAM:  Vitals:   04/19/18 0835  BP: 118/74  Pulse: 70  Temp: 98 F (36.7 C)  SpO2: 98%    Body mass index is 38.56 kg/m.  GENERAL: vitals reviewed and listed above, alert, oriented, appears well hydrated and in no acute distress  HEENT: atraumatic, conjunttiva clear, no obvious abnormalities on inspection of external nose and ears, normal appearance of ear canals and TMs except for clear effusions bilat L > R, clear nasal congestion, mild post oropharyngeal erythema with PND, no tonsillar edema or exudate, no sinus TTP  NECK: no obvious masses on inspection, no bruit  LUNGS: clear to auscultation bilaterally, no wheezes, rales or rhonchi, good air movement  CV: HRRR, no peripheral edema  MS: moves all extremities without noticeable abnormality  PSYCH/NEURO: pleasant and cooperative, no obvious depression or anxiety, cranial nerves II through XII  grossly intact, finger-to-nose is normal, gait is normal, speech and thought processing grossly intact, Dix-Hallpike positive to the left with reproduction of symptoms  ASSESSMENT AND PLAN:  Discussed the following assessment and plan:  Vertigo -we discussed possible serious and likely etiologies, workup and treatment, treatment risks and return precautions; there are many causes of vertigo, in her case 3 things currently going on could be causing her symptoms most likely.  Possible mild BPPV, eustachian tube dysfunction, anxiety. -after this discussion, [name] opted for some basic labs, glucose monitor check blood sugar when symptomatic for her concerns of low blood sugar, treatment of all 3; home exercises for the BPPV with referral for vestibular rehab if not successful.  She agrees to call.  Restart Flonase for the eustachian tube dysfunction, she has not been using it consistently.  She is to call immediately if any signs or symptoms of worsening or sinus infection.  CBT for an anxiety.  See below. -follow up advised 1 month -of course, we advised [name]  to return or notify a doctor immediately if symptoms worsen or persist or new concerns arise.   Dysfunction of both eustachian tubes -Restart Flonase, 2 sprays each nostril daily for 1 month, then 1 spray each nostril daily -She is to call immediately if any signs or symptoms of ear infection or sinus infection  Anxiety -Discussed options for treatment, she is going to try CBT with Dennison Bulla -Brochure provided so that she can call and schedule, she agrees to call today -Follow-up sooner if any worsening or new symptoms  -Patient advised to return or notify a doctor immediately if symptoms worsen or persist or new concerns arise.  Patient Instructions  BEFORE YOU LEAVE: -labs -glucose monitor -follow up: 1 month  We have ordered labs or studies at this visit. It can take up to 1-2 weeks for results and processing. IF results  require follow up or explanation, we will call you with instructions. Clinically stable results will be released to your [facility]. If you have not heard from [place] or cannot find your results in [facility] in 2 weeks please contact our office at [phone].  If you are not yet signed up for [facility], please consider signing up.  -check blood sugar if feeling poorly  -CALL the number provided today to set up counseling for Anxiety with Dennison Bulla  -flonase 2 sprays each nostril daily for 1 month, then 1 spray each nostril daily  Please see the link below for more information and a treatment for vertigo.  [email or web address]  Please do not drive with vertigo.  I hope you are feeling better soon!  Seek care promptly if your symptoms worsen, new concerns arise or you are not improving with treatment.  I hope you are feeling better soon! Seek care promptly if your symptoms worsen, new concerns arise or you are not improving with treatment.               Lucretia Kern, DO

## 2018-04-19 ENCOUNTER — Encounter: Payer: Self-pay | Admitting: Family Medicine

## 2018-04-19 ENCOUNTER — Ambulatory Visit: Payer: BLUE CROSS/BLUE SHIELD | Admitting: Family Medicine

## 2018-04-19 VITALS — BP 118/74 | HR 70 | Temp 98.0°F | Ht 65.0 in | Wt 231.7 lb

## 2018-04-19 DIAGNOSIS — R42 Dizziness and giddiness: Secondary | ICD-10-CM

## 2018-04-19 DIAGNOSIS — F419 Anxiety disorder, unspecified: Secondary | ICD-10-CM | POA: Diagnosis not present

## 2018-04-19 DIAGNOSIS — H6983 Other specified disorders of Eustachian tube, bilateral: Secondary | ICD-10-CM | POA: Diagnosis not present

## 2018-04-19 LAB — BASIC METABOLIC PANEL
BUN: 10 mg/dL (ref 6–23)
CALCIUM: 9.1 mg/dL (ref 8.4–10.5)
CHLORIDE: 103 meq/L (ref 96–112)
CO2: 32 meq/L (ref 19–32)
Creatinine, Ser: 0.71 mg/dL (ref 0.40–1.20)
GFR: 113.07 mL/min (ref >60.00)
GLUCOSE: 86 mg/dL (ref 70–99)
POTASSIUM: 4.2 meq/L (ref 3.5–5.1)
SODIUM: 138 meq/L (ref 135–145)

## 2018-04-19 LAB — CBC
HEMATOCRIT: 38.6 % (ref 36.0–46.0)
HEMOGLOBIN: 13 g/dL (ref 12.0–15.0)
MCHC: 33.7 g/dL (ref 30.0–36.0)
MCV: 91 fl (ref 78.0–100.0)
PLATELETS: 240 10*3/uL (ref 150.0–400.0)
RBC: 4.24 Mil/uL (ref 3.87–5.11)
RDW: 13.7 % (ref 11.5–15.5)
WBC: 5.3 10*3/uL (ref 4.0–10.5)

## 2018-04-19 MED ORDER — GLUCOSE BLOOD VI STRP: ORAL_STRIP | 1 refills | Status: DC

## 2018-04-19 MED ORDER — ACCU-CHEK FASTCLIX LANCETS MISC: 0 refills | Status: DC

## 2018-04-19 NOTE — Addendum Note (Signed)
Addended by: Agnes Lawrence on: 04/19/2018 09:19 AM   Modules accepted: Orders

## 2018-04-19 NOTE — Patient Instructions (Signed)
BEFORE YOU LEAVE: -labs -glucose monitor -follow up: 1 month  We have ordered labs or studies at this visit. It can take up to 1-2 weeks for results and processing. IF results require follow up or explanation, we will call you with instructions. Clinically stable results will be released to your [facility]. If you have not heard from [place] or cannot find your results in [facility] in 2 weeks please contact our office at [phone].  If you are not yet signed up for [facility], please consider signing up.  -check blood sugar if feeling poorly  -CALL the number provided today to set up counseling for Anxiety with Dennison Bulla  -flonase 2 sprays each nostril daily for 1 month, then 1 spray each nostril daily  Please see the link below for more information and a treatment for vertigo.  [email or web address]  Please do not drive with vertigo.  I hope you are feeling better soon! Seek care promptly if your symptoms worsen, new concerns arise or you are not improving with treatment.  I hope you are feeling better soon! Seek care promptly if your symptoms worsen, new concerns arise or you are not improving with treatment.

## 2018-04-30 NOTE — Progress Notes (Signed)
HPI:  Using dictation device. Unfortunately this device frequently misinterprets words/phrases.  1 month follow up for Dizziness, ETD, Anxiety/stress. See notes from 04/19/18 for details. Reviewed today. Reports she is doing much better.  Reports the dizziness completely resolved with treating the ear issues.  She has been doing the Flonase 2 sprays each nostril daily.  She still has some postnasal drip at times, however the ear symptoms and dizziness have completely resolved.  She continues to have some stress/anxiety mainly related to work.  She has not yet done CBT with Dennison Bulla, she has not had the time.  She does plan to perhaps that this in when she has more time.  She feels like the stress is improved some however. She knows that she needs to work on her diet.  Reports sugars not a problem, but carbohydrates are.  Her sugars have been fine at home when she checks them.  Her husband working to improve his diet as well.  She also frequently goes to the drive-through for fast food.  No regular exercise.  She also has some trouble eating fresh vegetables because of her Crohn's disease.  ROS: See pertinent positives and negatives per HPI.  Past Medical History:  Diagnosis Date  . ADD (attention deficit disorder)   . Anxiety   . Cholesterol serum elevated    on medication in the past  . Chronic kidney disease   . Crohn's disease of colon with fistula ([place])    sees Dr. Coralyn Mark, [facility]  . Depression   . Fibromyalgia - managed by Dr. Dossie Der, [facility] - takes Neurontin and Tramadol 11/20/2013  . Frequent headaches   . GERD (gastroesophageal reflux disease)   . Osteoarthritis    knee, spine  . Pneumonia   . Syncope     Past Surgical History:  Procedure Laterality Date  . anal fistula repair      X 4   . colonoscopy with dilation     X 2   . FRACTURE SURGERY    . TUBAL LIGATION      Family History  Problem Relation Age of Onset  . Hypertension Mother   . ADD / ADHD Mother    . Anxiety disorder Mother   . Arthritis Mother   . Depression Mother   . Varicose Veins Mother   . Diabetes Father   . Hearing loss Father   . Heart disease Father   . Cancer Sister        cervical  . ADD / ADHD Sister   . Anxiety disorder Sister   . Arthritis Sister   . Depression Sister   . Early death Sister   . Varicose Veins Sister   . Mental illness Brother        drugs and alcohol abuse  . ADD / ADHD Brother   . Alcohol abuse Brother   . Asthma Brother   . Depression Brother   . Drug abuse Brother   . ADD / ADHD Daughter     SOCIAL HX: See HPI   Current Outpatient Medications:  .  ACCU-CHEK FASTCLIX LANCETS MISC, Use as directed to check blood sugar once day, Disp: 102 each, Rfl: 0 .  acetaminophen (TYLENOL) 500 MG tablet, Take 1,000 mg by mouth every 6 (six) hours as needed. For migraines and stomach upset , Disp: , Rfl:  .  Adalimumab (HUMIRA) 40 MG/0.8ML PSKT, Inject 40 mg into the skin once a week., Disp: , Rfl:  .  desipramine (NORPRAMIN)  25 MG tablet, Take by mouth., Disp: , Rfl:  .  dicyclomine (BENTYL) 10 MG capsule, Take 10 mg by mouth 2 (two) times daily., Disp: , Rfl:  .  fluticasone (FLONASE) 50 MCG/ACT nasal spray, Place 1 spray into both nostrils daily., Disp: 48 g, Rfl: 0 .  glucose blood (ACCU-CHEK GUIDE) test strip, Use as instructed, Disp: 100 each, Rfl: 1 .  ondansetron (ZOFRAN ODT) 8 MG disintegrating tablet, Take 1 tablet (8 mg total) by mouth every 8 (eight) hours as needed for nausea or vomiting., Disp: 30 tablet, Rfl: 0 .  prenatal vitamin w/FE, FA (PRENATAL 1 + 1) 27-1 MG TABS tablet, Take 1 tablet by mouth daily before breakfast., Disp: 30 each, Rfl: 11  EXAM:  Vitals:   05/01/18 1124  BP: 130/78  Pulse: (!) 56  Temp: 98.6 F (37 C)    Body mass index is 38.94 kg/m.  GENERAL: vitals reviewed and listed above, alert, oriented, appears well hydrated and in no acute distress  HEENT: atraumatic, conjunttiva clear, no obvious  abnormalities on inspection of external nose and ears, ear canals and TMs normal  NECK: no obvious masses on inspection  LUNGS: clear to auscultation bilaterally, no wheezes, rales or rhonchi, good air movement  CV: HRRR, no peripheral edema  MS: moves all extremities without noticeable abnormality  PSYCH: pleasant and cooperative, no obvious depression or anxiety  ASSESSMENT AND PLAN:  Discussed the following assessment and plan:  Vertigo  Crohn's disease with complication, unspecified gastrointestinal tract location [facility]), Chronic  Dysfunction of both eustachian tubes  BMI 38.0-38.9,adult  Stress  -I am so glad the vertigo has resolved, recommended going to 1 spray each nostril and Flonase and continuing for another month or 2 -We talked about the stress and she feels like she is doing okay, did encourage her to try to do some CBT and she is considering when she can find some time -Discussed lifestyle at length, recommended weight reduction and a healthy lifestyle.  Recommended the Mediterranean or DASH diet.  Discussed a number of healthy, easy, fast options and advised that she avoid fast food. -Follow-up 3 months, sooner as needed   Patient Instructions  BEFORE YOU LEAVE: -follow up: 3 months  Work on a healthy low sugar, low carb diet and regular exercise. Would recommend a 20lb weight reduction over the next 3-6 months. The Mediterranean diet is a good diet to follow.  Try to get in with Dennison Bulla if you can.  Go down to 1 spray each nostril daily of the flonase. Continue for 2-3 months.      We recommend the following healthy lifestyle for LIFE: 1) Small portions. But, make sure to get regular (at least 3 per day), healthy meals and small healthy snacks if needed.  2) Eat a healthy clean diet.   TRY TO EAT: -at least 5-7 servings of low sugar, colorful, and nutrient rich vegetables per day (not corn, potatoes or bananas.) -berries are the best choice if  you wish to eat fruit (only eat small amounts if trying to reduce weight)  -lean meets (fish, white meat of chicken or [place]) -vegan proteins for some meals - beans or tofu, whole grains, nuts and seeds -Replace bad fats with good fats - good fats include: fish, nuts and seeds, canola oil, olive oil -small amounts of low fat or non fat dairy -small amounts of100 % whole grains - check the lables -drink plenty of water  AVOID: -SUGAR, sweets, anything with  added sugar, corn syrup or sweeteners - must read labels as even foods advertised as "healthy" often are loaded with sugar -if you must have a sweetener, small amounts of stevia may be best -sweetened beverages and artificially sweetened beverages -simple starches (rice, bread, potatoes, pasta, chips, etc - small amounts of 100% whole grains are ok) -red meat, pork, butter -fried foods, fast food, processed food, excessive dairy, eggs and coconut.  3)Get at least 150 minutes of sweaty aerobic exercise per week.  4)Reduce stress - consider counseling, meditation and relaxation to balance other aspects of your life.     Lucretia Kern, DO

## 2018-05-01 ENCOUNTER — Ambulatory Visit: Payer: BLUE CROSS/BLUE SHIELD | Admitting: Family Medicine

## 2018-05-01 ENCOUNTER — Encounter: Payer: Self-pay | Admitting: Family Medicine

## 2018-05-01 VITALS — BP 130/78 | HR 56 | Temp 98.6°F | Ht 65.0 in | Wt 234.0 lb

## 2018-05-01 DIAGNOSIS — R42 Dizziness and giddiness: Secondary | ICD-10-CM

## 2018-05-01 DIAGNOSIS — Z6838 Body mass index (BMI) 38.0-38.9, adult: Secondary | ICD-10-CM | POA: Diagnosis not present

## 2018-05-01 DIAGNOSIS — H6983 Other specified disorders of Eustachian tube, bilateral: Secondary | ICD-10-CM | POA: Diagnosis not present

## 2018-05-01 DIAGNOSIS — K50919 Crohn's disease, unspecified, with unspecified complications: Secondary | ICD-10-CM

## 2018-05-01 DIAGNOSIS — F439 Reaction to severe stress, unspecified: Secondary | ICD-10-CM

## 2018-05-01 NOTE — Patient Instructions (Signed)
BEFORE YOU LEAVE: -follow up: 3 months  Work on a healthy low sugar, low carb diet and regular exercise. Would recommend a 20lb weight reduction over the next 3-6 months. The Mediterranean diet is a good diet to follow.  Try to get in with Dennison Bulla if you can.  Go down to 1 spray each nostril daily of the flonase. Continue for 2-3 months.      We recommend the following healthy lifestyle for LIFE: 1) Small portions. But, make sure to get regular (at least 3 per day), healthy meals and small healthy snacks if needed.  2) Eat a healthy clean diet.   TRY TO EAT: -at least 5-7 servings of low sugar, colorful, and nutrient rich vegetables per day (not corn, potatoes or bananas.) -berries are the best choice if you wish to eat fruit (only eat small amounts if trying to reduce weight)  -lean meets (fish, white meat of chicken or [place]) -vegan proteins for some meals - beans or tofu, whole grains, nuts and seeds -Replace bad fats with good fats - good fats include: fish, nuts and seeds, canola oil, olive oil -small amounts of low fat or non fat dairy -small amounts of100 % whole grains - check the lables -drink plenty of water  AVOID: -SUGAR, sweets, anything with added sugar, corn syrup or sweeteners - must read labels as even foods advertised as "healthy" often are loaded with sugar -if you must have a sweetener, small amounts of stevia may be best -sweetened beverages and artificially sweetened beverages -simple starches (rice, bread, potatoes, pasta, chips, etc - small amounts of 100% whole grains are ok) -red meat, pork, butter -fried foods, fast food, processed food, excessive dairy, eggs and coconut.  3)Get at least 150 minutes of sweaty aerobic exercise per week.  4)Reduce stress - consider counseling, meditation and relaxation to balance other aspects of your life.

## 2018-05-07 NOTE — Unmapped (Signed)
[facility] Special[facility] Pharmacy Refill Coordination Note  Specialty Medication(s): Humira 40mg /0.9ml      Jasmine Mitchell, DOB: 15-Jun-1971  Phone: [phone] (home) , Alternate phone contact: N/A  Phone or address changes today?: No  All above HIPAA information was verified with patient.  Shipping Address: [address] [name]  [place] [address]   Insurance changes? No    Completed refill call assessment today to schedule patient's medication shipment from the [facility] [phone]).      Confirmed the medication and dosage are correct and have not changed: Yes, regimen is correct and unchanged.    Confirmed patient started or stopped the following medications in the past month:  No, there are no changes reported at this time.    Are you tolerating your medication?:  Jasmine Mitchell reports tolerating the medication.    ADHERENCE  Did you miss any doses in the past 4 weeks? No missed doses reported.    FINANCIAL/SHIPPING    Delivery Scheduled: Yes, Expected medication delivery date: 05/09/2018     Medication will be delivered via UPS to the home address in [facility].    The patient will receive a drug information handout for each medication shipped and additional FDA Medication Guides as required.      Jasmine Mitchell did not have any additional questions at this time.    We will follow up with patient monthly for standard refill processing and delivery.      Thank you,  Shelina Luo  Anders Grant   [facility] Specialty Pharmacist

## 2018-05-08 MED FILL — HUMIRA PEN CITRATE FREE 40 MG/0.4 ML: 28 days supply | Qty: 4 | Fill #2 | Status: AC

## 2018-05-08 MED FILL — HUMIRA PEN CITRATE FREE 40 MG/0.4 ML: 28 days supply | Qty: 4 | Fill #2

## 2018-06-06 MED FILL — HUMIRA PEN CITRATE FREE 40 MG/0.4 ML: 28 days supply | Qty: 4 | Fill #3 | Status: AC

## 2018-06-06 MED FILL — HUMIRA PEN CITRATE FREE 40 MG/0.4 ML: 28 days supply | Qty: 4 | Fill #3

## 2018-06-06 NOTE — Unmapped (Signed)
[facility] Specialty Pharmacy Refill Coordination Note  Specialty Medication(s): Humira CF  Additional Medications shipped: none    Jasmine Mitchell, DOB: Aug 28, 1970  Phone: [phone] (home) , Alternate phone contact: N/A  Phone or address changes today?: No  All above HIPAA information was verified with patient.  Shipping Address: [address] Jasmine Mitchell  [place] [address]   Insurance changes? No    Completed refill call assessment today to schedule patient's medication shipment from the [facility] [phone]).      Confirmed the medication and dosage are correct and have not changed: Yes, regimen is correct and unchanged.    Confirmed patient started or stopped the following medications in the past month:  No, there are no changes reported at this time.    Are you tolerating your medication?:  Jasmine Mitchell reports tolerating the medication.    ADHERENCE    (Below is required for Medicare Part B or Transplant patients only - per drug):   How many injections were dispensed last month: 4 injections  Patient currently has 0 remaining.    Did you miss any doses in the past 4 weeks? No missed doses reported.    FINANCIAL/SHIPPING    Delivery Scheduled: Yes, Expected medication delivery date: 06/07/18     Medication will be delivered via UPS to the home address in [facility].    The patient will receive a drug information handout for each medication shipped and additional FDA Medication Guides as required.      Jasmine Mitchell did not have any additional questions at this time.    We will follow up with patient monthly for standard refill processing and delivery.      Thank you,  Tera Helper   [facility] Specialty Pharmacist

## 2018-07-06 NOTE — Unmapped (Signed)
[facility] Specialty Pharmacy Refill Coordination Note  Medication: Humira 40mg /0.2ml    Unable to reach patient to schedule shipment for medication being filled at [facility]. Left voicemail on phone.  As this is the 3rd unsuccessful attempt to reach the patient, no additional phone call attempts will be made at this time.      Phone numbers attempted: [phone]  Dates called: 06/27/2018; 07/02/2018; 07/06/2018  Last scheduled delivery: 06/06/18    Please call the [facility] at [phone] (option 4) should you have any further questions.      Thanks,  [facility] Specialty Team

## 2018-07-10 NOTE — Unmapped (Signed)
Called pt as [facility] informed RN they have been unable to reach pt to schedule Humira  Pt also due for appointment (28month f/u) with Dr. Raphael Gibney. Provided [facility] pharmacy information and GIS information on pt vm

## 2018-07-12 MED FILL — HUMIRA PEN CITRATE FREE 40 MG/0.4 ML: 28 days supply | Qty: 4 | Fill #4

## 2018-07-12 MED FILL — HUMIRA PEN CITRATE FREE 40 MG/0.4 ML: 28 days supply | Qty: 4 | Fill #4 | Status: AC

## 2018-07-12 NOTE — Unmapped (Signed)
[facility] Specialty Pharmacy Refill and Clinical Coordination Note  Medication(s): Humira 40mg /0.5ml    Jasmine Mitchell, DOB: April 26, 1971  Phone: [phone] (home) , Alternate phone contact: N/A  Shipping address: [address]  [place] [address]  Phone or address changes today?: No  All above HIPAA information verified.  Insurance changes? No    Completed refill and clinical call assessment today to schedule patient's medication shipment from the [facility] [phone]).      MEDICATION RECONCILIATION    Confirmed the medication and dosage are correct and have not changed: Yes, regimen is correct and unchanged.    Were there any changes to your medication(s) in the past month:  No, there are no changes reported at this time.    ADHERENCE    Is this medicine transplant or covered by Medicare Part B? No.    Humira 40mg /0.55ml: Patient has 0 on hand.    Did you miss any doses in the past 4 weeks? No missed doses reported.  Adherence counseling provided? Not needed     SIDE EFFECT MANAGEMENT    Are you tolerating your medication?:  Jasmine Mitchell reports tolerating the medication.  Side effect management discussed: None      Therapy is appropriate and should be continued.    Evidence of clinical benefit: See Epic note from 12/08/17      FINANCIAL/SHIPPING    Delivery Scheduled: Yes, Expected medication delivery date: 07/13/18     Medication will be delivered via UPS to the home address in [facility].    Additional medications refilled: No additional medications/refills needed at this time.    The patient will receive a drug information handout for each medication shipped and additional FDA Medication Guides as required.      Jasmine Mitchell did not have any additional questions at this time.    Delivery address confirmed in Epic.     We will follow up with patient monthly for standard refill processing and delivery.      Thank you,  Tera Helper   [facility] Specialty Pharmacist

## 2018-08-01 NOTE — Progress Notes (Deleted)
HPI:  Using dictation device. Unfortunately this device frequently misinterprets words/phrases.  Katrina Grant is a pleasant 48 yo here for follow up. She has a PMH significant for Crohn's (sees GI for management, on Humira), Obesity, mild hyperglycemia and anxiety here for follow up. She was trying some flonase for ETD and was considering seeking Dennison Bulla in behavioral health for anxiety at her last visit. She also was planning on working on lifestyle changes for weight reduction. Reports. Denies.  ROS: See pertinent positives and negatives per HPI.  Past Medical History:  Diagnosis Date  . ADD (attention deficit disorder)   . Anxiety   . Cholesterol serum elevated    on medication in the past  . Chronic kidney disease   . Crohn's disease of colon with fistula ([place])    sees Dr. Coralyn Mark, [facility]  . Depression   . Fibromyalgia - managed by Dr. Dossie Der, [facility] - takes Neurontin and Tramadol 11/20/2013  . Frequent headaches   . GERD (gastroesophageal reflux disease)   . Osteoarthritis    knee, spine  . Pneumonia   . Syncope     Past Surgical History:  Procedure Laterality Date  . anal fistula repair      X 4   . colonoscopy with dilation     X 2   . FRACTURE SURGERY    . TUBAL LIGATION      Family History  Problem Relation Age of Onset  . Hypertension Mother   . ADD / ADHD Mother   . Anxiety disorder Mother   . Arthritis Mother   . Depression Mother   . Varicose Veins Mother   . Diabetes Father   . Hearing loss Father   . Heart disease Father   . Cancer Sister        cervical  . ADD / ADHD Sister   . Anxiety disorder Sister   . Arthritis Sister   . Depression Sister   . Early death Sister   . Varicose Veins Sister   . Mental illness Brother        drugs and alcohol abuse  . ADD / ADHD Brother   . Alcohol abuse Brother   . Asthma Brother   . Depression Brother   . Drug abuse Brother   . ADD / ADHD Daughter     SOCIAL HX: ***   Current  Outpatient Medications:  .  ACCU-CHEK FASTCLIX LANCETS MISC, Use as directed to check blood sugar once day, Disp: 102 each, Rfl: 0 .  acetaminophen (TYLENOL) 500 MG tablet, Take 1,000 mg by mouth every 6 (six) hours as needed. For migraines and stomach upset , Disp: , Rfl:  .  Adalimumab (HUMIRA) 40 MG/0.8ML PSKT, Inject 40 mg into the skin once a week., Disp: , Rfl:  .  desipramine (NORPRAMIN) 25 MG tablet, Take by mouth., Disp: , Rfl:  .  dicyclomine (BENTYL) 10 MG capsule, Take 10 mg by mouth 2 (two) times daily., Disp: , Rfl:  .  fluticasone (FLONASE) 50 MCG/ACT nasal spray, Place 1 spray into both nostrils daily., Disp: 48 g, Rfl: 0 .  glucose blood (ACCU-CHEK GUIDE) test strip, Use as instructed, Disp: 100 each, Rfl: 1 .  ondansetron (ZOFRAN ODT) 8 MG disintegrating tablet, Take 1 tablet (8 mg total) by mouth every 8 (eight) hours as needed for nausea or vomiting., Disp: 30 tablet, Rfl: 0 .  prenatal vitamin w/FE, FA (PRENATAL 1 + 1) 27-1 MG TABS tablet, Take 1 tablet  by mouth daily before breakfast., Disp: 30 each, Rfl: 11  EXAM:  There were no vitals filed for this visit.  There is no height or weight on file to calculate BMI.  GENERAL: vitals reviewed and listed above, alert, oriented, appears well hydrated and in no acute distress  HEENT: atraumatic, conjunttiva clear, no obvious abnormalities on inspection of external nose and ears  NECK: no obvious masses on inspection  LUNGS: clear to auscultation bilaterally, no wheezes, rales or rhonchi, good air movement  CV: HRRR, no peripheral edema  MS: moves all extremities without noticeable abnormality *** PSYCH: pleasant and cooperative, no obvious depression or anxiety  ASSESSMENT AND PLAN:  Discussed the following assessment and plan:  No diagnosis found.  *** -Patient advised to return or notify a doctor immediately if symptoms worsen or persist or new concerns arise.  There are no Patient Instructions on file for  this visit.  Lucretia Kern, DO

## 2018-08-02 ENCOUNTER — Ambulatory Visit: Payer: BLUE CROSS/BLUE SHIELD | Admitting: Family Medicine

## 2018-08-02 DIAGNOSIS — Z0289 Encounter for other administrative examinations: Secondary | ICD-10-CM

## 2018-08-07 NOTE — Unmapped (Signed)
[facility] Specialty Pharmacy Refill Coordination Note  Specialty Medication(s): Humira  Additional Medications shipped: none    Jasmine Mitchell, DOB: [birth year]  Phone: [phone] (home) , Alternate phone contact: N/A  Phone or address changes today?: No  All above HIPAA information was verified with patient.  Shipping Address: [address] [name]  [place] [address]   Insurance changes? No    Completed refill call assessment today to schedule patient's medication shipment from the [facility] [phone]).      Confirmed the medication and dosage are correct and have not changed: Yes, regimen is correct and unchanged.    Confirmed patient started or stopped the following medications in the past month:  No, there are no changes reported at this time.    Are you tolerating your medication?:  Jasmine Mitchell reports tolerating the medication.    ADHERENCE    Next injection due 08/10/18 and she needs a pen for that injecion    Did you miss any doses in the past 4 weeks? No missed doses reported.    FINANCIAL/SHIPPING    Delivery Scheduled: Yes, Expected medication delivery date: 08/09/18     Medication will be delivered via UPS to the home address in [facility].    The patient will receive a drug information handout for each medication shipped and additional FDA Medication Guides as required.      Jasmine Mitchell did not have any additional questions at this time.    We will follow up with patient monthly for standard refill processing and delivery.      Thank you,  Rollen Sox   [facility] Specialty Pharmacist

## 2018-08-08 MED FILL — HUMIRA PEN CITRATE FREE 40 MG/0.4 ML: 28 days supply | Qty: 4 | Fill #5

## 2018-08-08 MED FILL — HUMIRA PEN CITRATE FREE 40 MG/0.4 ML: 28 days supply | Qty: 4 | Fill #5 | Status: AC

## 2018-08-21 ENCOUNTER — Encounter: Admit: 2018-08-21 | Discharge: 2018-08-22 | Payer: PRIVATE HEALTH INSURANCE

## 2018-08-21 DIAGNOSIS — K603 Anal fistula: Secondary | ICD-10-CM | POA: Diagnosis not present

## 2018-08-21 DIAGNOSIS — F419 Anxiety disorder, unspecified: Secondary | ICD-10-CM | POA: Diagnosis not present

## 2018-08-21 DIAGNOSIS — M797 Fibromyalgia: Secondary | ICD-10-CM | POA: Diagnosis not present

## 2018-08-21 DIAGNOSIS — K5080 Crohn's disease of both small and large intestine without complications: Secondary | ICD-10-CM | POA: Diagnosis not present

## 2018-08-21 DIAGNOSIS — K5090 Crohn's disease, unspecified, without complications: Principal | ICD-10-CM

## 2018-08-21 DIAGNOSIS — K219 Gastro-esophageal reflux disease without esophagitis: Principal | ICD-10-CM

## 2018-08-21 DIAGNOSIS — N390 Urinary tract infection, site not specified: Principal | ICD-10-CM

## 2018-08-21 DIAGNOSIS — K580 Irritable bowel syndrome with diarrhea: Principal | ICD-10-CM

## 2018-08-21 DIAGNOSIS — M1990 Unspecified osteoarthritis, unspecified site: Principal | ICD-10-CM

## 2018-08-21 LAB — COMPREHENSIVE METABOLIC PANEL
ALBUMIN: 4.2 g/dL (ref 3.5–5.0)
ALKALINE PHOSPHATASE: 86 U/L (ref 38–126)
ALT (SGPT): 18 U/L (ref <35)
ANION GAP: 7 mmol/L (ref 7–15)
AST (SGOT): 25 U/L (ref 14–38)
BILIRUBIN TOTAL: 0.4 mg/dL (ref 0.0–1.2)
BLOOD UREA NITROGEN: 13 mg/dL (ref 7–21)
BUN / CREAT RATIO: 15
CHLORIDE: 104 mmol/L (ref 98–107)
CO2: 30 mmol/L (ref 22.0–30.0)
CO2: 30 mmol/L (ref 22.0–30.0)
EGFR CKD-EPI AA FEMALE: >90 mL/min/{1.73_m2} (ref >=60)
EGFR CKD-EPI NON-AA FEMALE: 81 mL/min/{1.73_m2} (ref >=60)
GLUCOSE RANDOM: 95 mg/dL (ref 70–179)
POTASSIUM: 4.4 mmol/L (ref 3.5–5.0)
SODIUM: 141 mmol/L (ref 135–145)

## 2018-08-21 LAB — IRON & TIBC
IRON SATURATION (CALC): 15 % (ref 15–50)
TOTAL IRON BINDING CAPACITY (CALC): 407.6 mg/dL (ref 252.0–479.0)
TOTAL IRON BINDING CAPACITY (CALC): 407.6 mg/dL (ref 252.0–479.0)

## 2018-08-21 LAB — CBC
HEMOGLOBIN: 14.2 g/dL (ref 12.0–16.0)
MEAN CORPUSCULAR HEMOGLOBIN CONC: 33.4 g/dL (ref 31.0–37.0)
MEAN CORPUSCULAR HEMOGLOBIN: 30.6 pg (ref 26.0–34.0)
MEAN CORPUSCULAR VOLUME: 91.6 fL (ref 80.0–100.0)
MEAN PLATELET VOLUME: 8.7 fL (ref 7.0–10.0)
RED BLOOD CELL COUNT: 4.63 10*12/L (ref 4.00–5.20)
RED CELL DISTRIBUTION WIDTH: 13.7 % (ref 12.0–15.0)
WBC ADJUSTED: 8.9 10*9/L (ref 4.5–11.0)

## 2018-08-21 MED ORDER — DESIPRAMINE 25 MG TABLET
ORAL_TABLET | Freq: Every day | ORAL | 6 refills | 0 days | Status: CP
Start: 2018-08-21 — End: 2018-09-14

## 2018-08-21 NOTE — Unmapped (Addendum)
INSTRUCTIONS:     1.  Continue weekly Humira  2.  We will check labs today including Humira level (weekly dosing, due in 3 days)  3.  Colonoscopy next month or two to see how Crohn's is doing  4.  Pnemonia vaccine today  5.  Increase Desipramine to 75mg  at bedtime  5.  If any trouble or symptoms, do not hesitate to call.       Zetta Bills, MD  Assistant Professor of Medicine  Division of Gastroenterology & Hepatology  [place] [facility] [place]    Clinical Nurse Contact:  Neta Mends, RN  Ph#  [phone]  Fax#  [phone]    Office Number Rubbie Battiest):  Ph# [phone]  Fax # [phone]    For clinic appointments, please call, [phone], option 1.  For questions regarding radiology appointments, or to schedule, [phone].  For questions regarding scheduling GI procedures (e.g,. Colonoscopy), please call, [phone], option 2.    For educational material and resources:  [email or web address]  ============================================

## 2018-08-21 NOTE — Unmapped (Signed)
[facility]   FOLLOWUP NOTE - INFLAMMATORY BOWEL DISEASE  09/04/2018    Demographics:  Jasmine Mitchell is a 48 y.o. year old female    Diagnosis:  Crohn's Disease  Disease onset (yr): 55  Age at onset:   < 44 yr old (A1)  Location:  Ileocolonic (L3) (mostly colonic)  Behavior:  Perianal (P), Stricturing (B2)  Current Tight Control Scenario:   Maintenance = Biologic          HPI / NOTE :     Interval Events:   1.  Last seen by me 11/2017, at that time plan to continue Humira weekly for Crohn's and Desipramine 50mg  + dicylclomine for IBS.  Of note, we sent desipramine 3 months worth in June but no refills since then.   2.  Last colonoscopy 07/2016 - recommended to have 2 yr follow up    HPI:  Bowel symptoms are ok.  Having 2-3BMs/day which is a bit better (largely related to desipramine).  She does have ongoing stomach cramps.  She does have considerable ongoing issues with anxiety which is quite bothersome.  It is considerable stress which is a trigger for feeling poorly and GI symptoms.  Mood is okay.    No extraintestinal manifestations of IBD.     Abdominal pain (0-10):  Some stomach cramps while eating (upper abdomen)  BM a day: 2-3x/day  (less since being on desipramine)  Consistency: soft  % of stools have blood: 0%  Nocturnal BM: rare  Urgency:  yes  Weight change over last 6 mo: stable  Smoking:  no  NSAIDS: avoids    Review of Systems:   Review of systems positive for: negative except as above.   Otherwise, the balance of 10 systems is negative.          IBD HISTORY:     Brief IBD Disease Course:    - 33 - dx with Crohn's dz ~age 72, sx of diarrhea, abd pain, rectal pain and bloody stools.  Treated with sulfasalazine, Asacol, Pentasa without benefit.  Got courses of steroids and antibiotics on and off.  Tried on but had nausea.   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    - 2004 - Remicade x 9 months. Stopped in 2005 due to 2ndary loss of response.   - 2005 - developed a pyoderma lesion.   - 2010 - Re-established care with Dr. Marcella Dubs.  Concern for internal fistulizing disease.  Was on Cimzia. Had surgery for perianal disease.   - 2011 - Started on Humira.   - 2012 - active perianal disease with a large abscess.  Required EUA, drainage, setons ([facility]).   - 2013 - April, dx with pelvic floor dyssynergia and anal sphincter weakness, underwent biofeedback x 2 courses ([place] and [place]).   - 2014 - on - doing well on Humira and long term Cipro.  Having symptoms which have been thought to be related to intermittent IBS symptoms.     Endoscopy:      - Colonoscopy 06/17/2003: patch inflammation and ulcers throughout the colon.    - Colonoscopy 01/2009:  Active crohn's at IC valve. Pseudopolyps.  Retraction of IC valve. ?Rectosigmoid fistula tract.  Otherwise normal colon.   - Colonoscopy 10/08/2009:  Perianal fistula, pseudopolyps.  Few aphthae in TI.   - Colonoscopy 07/29/2010:  Poor colon prep, pseudopolyps, stricture at IC valve.  Few ulcers at IC valve. Rectal stricture. Ileal crohn's disease.   - Colonoscopy 05/05/2011:  Pseduopolyps throughout.  4mm cecal polyp. Large fistula in sigmoid colon. Normal TI.   - Colonoscopy 09/06/2012:  Healed scars from prior fistulotomies on perianal exam. Scar in ascending colon. No active Crohn's.   - Colonoscopy 10/24/13:  Pseudopolyps in rectosigmoid, otherwise normal colon and TI.   - Colonoscopy 10/01/14:  Pseudopolyps in rectum, transverse and ascending colon.  Otherwise normal colon.  Granular TI.  Fixed and open IC valve (not stenotic).   - Colonoscopy 08/12/16:  Mild anal canal stenosis on exam, otherwise normal colon and ileum.     PATH = chronic quiescent colitis (left and right colon) no dysplasia    Imaging:    - CT A/P 10/24/2013: mild colonic wall thickening at splenic/descending colon, no other bowel inflammation or stranding.  *Pachy groundglass opacity in left lung base.     Prior IBD medications (type, dose, duration, response):  x 5-ASAs - Sulfasalazine, Asacol, Pentasa  x Oral corticosteroids - Prednisone  ? Intravenous corticosteroids  x Antibiotics - Flagyl - nausea.  Cipro - tolerates well and it helps.   x Thiopurines - - nausea.    TPMT - normal 07/25/17  x Methotrexate - 2005.   x Anti-TNF therapies - Remicade 2004 - 2005 (~6 doses).  Had gradual loss of response so stopped. Tried on Remicade again in 2009 with some effect, increased to 10mg /kg q4 weeks. Cimzia - started 09/2008.  Humira 2014 - dose increased to 80mg  q2 weeks.  04/2016 - humira level 5.6, Ab negative.   ? Cyclosporine  ? Clinical trial medication  ? Other (Please specify):    IBD health maintenance:  Influenza vaccine: 2012, 117/17  Pneumonia vaccine: Prevnar spring 2017 by patient report (at PCP office)  Hepatitis B:   TB testing:   Chickenpox/Shingles history:   Bone denistometry:   Derm appointment:  Last small bowel imaging:   Last colonoscopy: 10/01/2014  PAP smear:     Extraintestinal manifestations:   -joint pains affecting: n  -eye: n  -skin: n  -oral ulcers :  n  -blood clots: n  -PSC: n  -other: n          Past Medical History:   Past medical history:   Past Medical History:   Diagnosis Date   ??? Acid reflux    ??? Arthritis    ??? Crohn disease (CMS-HCC)    ??? Fibromyalgia    ??? UTI (urinary tract infection)      Past surgical history:   Past Surgical History:   Procedure Laterality Date   ??? FRACTURE SURGERY      left wrist   ??? GASTROCUTANEOUS FISTULA CLOSURE     ??? ORTHOPEDIC SURGERY     ??? PR COLONOSCOPY FLX DX W/COLLJ SPEC WHEN PFRMD N/A 10/24/2013    Procedure: COLONOSCOPY, FLEXIBLE, PROXIMAL TO SPLENIC FLEXURE; DIAGNOSTIC, W/WO COLLECTION SPECIMEN BY BRUSH OR WASH;  Surgeon: Theadore Nan, MD;  [facility];  Service: Gastroenterology   ??? PR COLONOSCOPY W/BIOPSY SINGLE/MULTIPLE N/A 10/01/2014    Procedure: COLONOSCOPY, FLEXIBLE, PROXIMAL TO SPLENIC FLEXURE; WITH BIOPSY, SINGLE OR MULTIPLE;  Surgeon: Teodoro Spray, MD;  [facility];  Service: Gastroenterology   ??? PR COLONOSCOPY W/BIOPSY SINGLE/MULTIPLE N/A 08/12/2016    Procedure: COLONOSCOPY, FLEXIBLE, PROXIMAL TO SPLENIC FLEXURE; WITH BIOPSY, SINGLE OR MULTIPLE;  Surgeon: Zetta Bills, MD;  [facility]: [facility];  Service: Gastroenterology   ??? PR SURG DIAGNOSTIC EXAM, ANORECTAL N/A 12/23/2016    Procedure:  ANORECTAL EXAM, SURGICAL, REQUIRING ANESTHESIA (GENERAL, SPINAL, OR EPIDURAL), DIAGNOSTIC;  Surgeon: Mickle Asper, MD;  Location: MAIN OR ;  Service: Gastrointestinal   ??? PR UPPER GI ENDOSCOPY,DIAGNOSIS N/A 08/12/2016    Procedure: UGI ENDO, INCLUDE ESOPHAGUS, STOMACH, & DUODENUM &/OR JEJUNUM; DX W/WO COLLECTION SPECIMN, BY BRUSH OR WASH;  Surgeon: Zetta Bills, MD;  Location: [facility];  Service: Gastroenterology   ??? TUBAL LIGATION       Family history:   Family History   Problem Relation Age of Onset   ??? Cancer Sister    ??? Cancer Paternal Grandmother    ??? Colorectal Cancer Neg Hx      Social history:   Social History     Socioeconomic History   ??? Marital status: Single     Spouse name: None   ??? Number of children: None   ??? Years of education: None   ??? Highest education level: None   Occupational History   ??? None   Social Needs   ??? Financial resource strain: None   ??? Food insecurity     Worry: None     Inability: None   ??? Transportation needs     Medical: None     Non-medical: None   Tobacco Use   ??? Smoking status: Never Smoker   ??? Smokeless tobacco: Never Used   Substance and Sexual Activity   ??? Alcohol use: Yes     Alcohol/week: 0.0 standard drinks     Comment: socially    ??? Drug use: No   ??? Sexual activity: None   Lifestyle   ??? Physical activity     Days per week: None     Minutes per session: None   ??? Stress: None   Relationships   ??? Social Wellsite geologist on phone: None     Gets together: None     Attends religious service: None     Active member of club or organization: None     Attends meetings of clubs or organizations: None     Relationship status: None   Other Topics Concern   ??? None   Social History Narrative   ??? None             Allergies:     Allergies   Allergen Reactions   ??? Oxycodone (Bulk) Nausea And Vomiting     Patient tolerates acetaminophen well             Medications:     Current Outpatient Medications   Medication Sig Dispense Refill   ??? acetaminophen (TYLENOL) 500 MG tablet Take 1,000 mg by mouth every four (4) hours as needed for pain.     ??? ADALIMUMAB PEN CITRATE FREE 40 MG/0.4 ML INJECT 1 PEN (40 MG TOTAL) UNDER THE SKIN EVERY SEVEN (7) DAYS. 4 each 6   ??? desipramine (NORPRAMIN) 25 MG tablet TAKE 2 TABLETS BY MOUTH EVERY DAY 60 tablet 2   ??? dicyclomine (BENTYL) 10 mg capsule TAKE 1 CAPSULE BY MOUTH 3 TIMES A DAY 90 capsule 3   ??? fluticasone (FLONASE) 50 mcg/actuation nasal spray 1 SPRAY BY EACH NARE ROUTE DAILY. AS DIRECTED (Patient taking differently: 1 spray by Each Nare route daily as needed. As directed) 16 mL 2   ??? loratadine (CLARITIN) 10 mg tablet Take 10 mg by mouth once as needed.      ??? MULTIVIT WITH CALCIUM,IRON,MIN (WOMEN'S DAILY MULTIVITAMIN ORAL) Take 1 tablet by mouth  once daily.     ??? ondansetron (ZOFRAN-ODT) 8 MG disintegrating tablet Take 8 mg by mouth every eight (8) hours as needed.      ??? cholestyramine-aspartame (CHOLESTYRAMINE LIGHT) 4 gram PwPk Take 1 packet by mouth Two (2) times a day. (Patient not taking: Reported on 12/08/2017) 180 packet 3   ??? ciprofloxacin HCl (CIPRO) 500 MG tablet TAKE 1 TABLET BY MOUTH 2 TIMES A DAY (Patient not taking: Reported on 07/25/2017) 60 tablet 2   ??? desipramine (NORPRAMIN) 25 MG tablet Take 3 tablets (75 mg total) by mouth daily. 90 tablet 6   ??? oxyCODONE (ROXICODONE) 5 MG immediate release tablet Take 1 tablet (5 mg total) by mouth every six (6) hours as needed for pain. (Patient not taking: Reported on 01/20/2017) 10 tablet 0     No current facility-administered medications for this visit.              Physical Exam:   BP 129/79  - Pulse 103 - Temp 36.7 ??C (98 ??F)  - Wt (!) 107 kg (236 lb)  - BMI 40.49 kg/m??     GEN:  Obese, well appearing female in no apparent distress, appears comfortable on exam  HEENT: PEERL, OP clear with no erythema, lesions, exudate, mucous membranes moist  NECK: Supple, no lymphadenopathy  PULM: CTAB, no wheezes, rales, or rhonchi  CV: S1/S2, RRR, no murmurs  GI: Soft, mildly tender throughout, most notable in mid-abdomen and RLQ, nondistended, normoactive bowel sounds, no rebound/guarding, no appreciable organomegaly  Extremities: no cyanosis, clubbing or edema, normal gait  Psych: affect appropriate, A&O x3  Skin:  No skin rashes or lesions          Labs, Data & Indices:     Lab Review:   Lab Results   Component Value Date    WBC 8.9 08/21/2018    WBC 6.5 04/29/2016    RBC 4.63 08/21/2018    RBC 4.50 04/29/2016    HGB 14.2 08/21/2018    HGB 13.2 04/29/2016     Lab Results   Component Value Date    AST 25 08/21/2018    AST 13 04/29/2016    ALT 18 08/21/2018    ALT 9 04/29/2016    BUN 13 08/21/2018    BUN 11 04/29/2016    Creatinine 0.85 08/21/2018    Creatinine 0.80 04/29/2016    CO2 30.0 08/21/2018    CO2 21 04/29/2016    Albumin 4.2 08/21/2018    Albumin 4.3 08/19/2014    Calcium 10.0 08/21/2018    Calcium 9.4 04/29/2016     Lab Results   Component Value Date    TSH 2.810 04/29/2016      ...........................................................................................................................................Marland Kitchen   Diagnosis ICD-10-CM Associated Orders   1. Crohn's disease of both small and large intestine without complication (CMS-HCC) K50.80 CBC     Comprehensive Metabolic Panel     C-reactive protein     Sedimentation Rate     Ferritin     Iron & TIBC     Vitamin B12 Level     Colonoscopy     Adalimumab/Adalimumab Ab     Adalimumab/Adalimumab Ab     Vitamin B12 Level     Iron & TIBC     Ferritin     Sedimentation Rate     C-reactive protein     Comprehensive Metabolic Panel     CBC   2. Irritable bowel syndrome with diarrhea K58.0    3. Fibromyalgia  M79.7    4. Perianal fistula K60.3    5. Anxiety F41.9            Assessment & Recommendations:   Disease state:  Ileocolonic crohn's with perianal disease and anorectal stricture    Anjani Feuerborn is a 48 y.o. female with a long standing hx of stricturing ileocolonic Crohn's disease.  She also has hx of perianal disease last requiring drainage by Dr. Elenore Rota (summer 2018).  She is currently maintained on weekly Humira monotherapy and doing symptomatically well on that.  She also has Irritable bowel with diarrhea and pain, which has been doing better since starting Desipramine.  She does have ongoing issues with stress and anxiety which are quite bothersome.     PLAN:  CROHN'S DISEASE  1.  Continue Humira 1 pen weekly  2.  Check humira trough level  3.  Colonoscopy next 1-2 months to assess Crohn's activity; also due for dysplasia surveillance at same time  4.  Update labs today including CBC, CMP, iron, b12  5.  If perianal disease becomes more bothersome, may need an EUA in future    IBS-D, Anxiety  6.  Continue Desipramine increase to 75mg  qHS  7.  Continue Dicyclomine 10mg  three times daily    8.  Follow up in ~4 months  --------------------------------------------  Author: Zetta Bills 09/04/2018 2:00 PM    Zetta Bills, MD  Assistant Professor of Medicine  Division of Gastroenterology & Hepatology  [facility]

## 2018-08-28 NOTE — Unmapped (Signed)
[facility] Refill Coordination Note    Specialty Medication(s) to be Shipped:   General Specialty: Humira 40mg /0.67ml    Other medication(s) to be shipped:       Jasmine Mitchell, DOB: 1970/12/18  Phone: There are no phone numbers on file.      All above HIPAA information was verified with patient.     Completed refill call assessment today to schedule patient's medication shipment from the [facility] [phone]).       Specialty medication(s) and dose(s) confirmed: Regimen is correct and unchanged.   Changes to medications: Irania reports no changes reported at this time.  Changes to insurance: No  Questions for the pharmacist: No    Confirmed patient received Welcome Packet with first shipment. The patient will receive a drug information handout for each medication shipped and additional FDA Medication Guides as required.       DISEASE/MEDICATION-SPECIFIC INFORMATION        N/A    SPECIALTY MEDICATION ADHERENCE     Medication Adherence    Patient reported X missed doses in the last month:  0  Specialty Medication:  Humira 40mg /0.56ml                Humira 40/0.4 mg/ml: 7 days of medicine on hand         SHIPPING     Shipping address confirmed in Epic.     Delivery Scheduled: Yes, Expected medication delivery date: 031720.     Medication will be delivered via UPS to the home address in Epic WAM.    [name]   [facility] Specialty Technician

## 2018-08-29 ENCOUNTER — Ambulatory Visit: Payer: BLUE CROSS/BLUE SHIELD | Admitting: Psychology

## 2018-09-03 MED FILL — HUMIRA PEN CITRATE FREE 40 MG/0.4 ML: 28 days supply | Qty: 4 | Fill #6 | Status: AC

## 2018-09-03 MED FILL — HUMIRA PEN CITRATE FREE 40 MG/0.4 ML: 28 days supply | Qty: 4 | Fill #6

## 2018-09-04 DIAGNOSIS — M797 Fibromyalgia: Principal | ICD-10-CM

## 2018-09-04 DIAGNOSIS — K5010 Crohn's disease of large intestine without complications: Principal | ICD-10-CM

## 2018-09-04 DIAGNOSIS — K219 Gastro-esophageal reflux disease without esophagitis: Principal | ICD-10-CM

## 2018-09-04 DIAGNOSIS — Z1211 Encounter for screening for malignant neoplasm of colon: Principal | ICD-10-CM

## 2018-09-04 DIAGNOSIS — Z885 Allergy status to narcotic agent status: Principal | ICD-10-CM

## 2018-09-04 DIAGNOSIS — E669 Obesity, unspecified: Principal | ICD-10-CM

## 2018-09-04 DIAGNOSIS — K624 Stenosis of anus and rectum: Principal | ICD-10-CM

## 2018-09-04 DIAGNOSIS — M1990 Unspecified osteoarthritis, unspecified site: Principal | ICD-10-CM

## 2018-09-05 ENCOUNTER — Other Ambulatory Visit: Payer: Self-pay

## 2018-09-05 ENCOUNTER — Ambulatory Visit: Payer: BLUE CROSS/BLUE SHIELD | Admitting: Psychology

## 2018-09-05 DIAGNOSIS — F4322 Adjustment disorder with anxiety: Secondary | ICD-10-CM | POA: Diagnosis not present

## 2018-09-13 NOTE — Unmapped (Signed)
Patient called with increased symptoms over the past week.  Has been doing well up until this point on weekly Humira. Last dose was 09/07/2018.  Mentions that starting last Thursday she had acute onset abdominal cramping pain, increased BM frequency to 6times per day with urgency, loose stool.      Called her to discuss. At time of the call she states she feels improved in terms of BM frequency, this has significantly slowed down but stool is still loose, 2-3times per day. Urgency has resolved. She is still having some pain in upper mid to left quadrant/under ribs on left side after eating but this has been ongoing. She does not have any pain except when she eats.  She wonders if she needs evaluation of gall bladder or colonoscopy moved up from June date.     Suggest that actue symptoms may have been viral or foodborne since seem to be self resolving. She will continue to monitor and update if symptoms get worse again.  Will update Dr. Raphael Gibney.

## 2018-09-14 MED ORDER — DESIPRAMINE 25 MG TABLET
ORAL_TABLET | Freq: Every day | ORAL | 6 refills | 0.00000 days | Status: CP
Start: 2018-09-14 — End: 2019-09-14

## 2018-09-20 ENCOUNTER — Ambulatory Visit: Payer: BLUE CROSS/BLUE SHIELD | Admitting: Psychology

## 2018-09-27 MED ORDER — ADALIMUMAB PEN CITRATE FREE 40 MG/0.4 ML
5 refills | 0 days | Status: CP
Start: 2018-09-27 — End: 2019-09-27
  Filled 2018-10-04: qty 4, 28d supply, fill #0

## 2018-09-27 NOTE — Unmapped (Signed)
Appts up to date. Humira refill authorized per protocol.

## 2018-10-01 NOTE — Unmapped (Signed)
[facility] has made a second and final attempt to reach this patient to refill the following medication:Humira.      We have Left voicemails on the following phone numbers: [phone].    Dates contacted:  04/06,0413    Last scheduled delivery: 03/16    The patient may be at risk of non-compliance with this medication. The patient should call the [facility] at [phone] (option 4) to refill medication.    [name]   [facility] Specialty Technician

## 2018-10-03 NOTE — Unmapped (Signed)
[facility] Refill Coordination Note    Specialty Medication(s) to be Shipped:   Inflammatory Disorders: Humira    Other medication(s) to be shipped: na     Jasmine Mitchell, DOB: [birth year]  Phone: There are no phone numbers on file.      All above HIPAA information was verified with patient.     Completed refill call assessment today to schedule patient's medication shipment from the [facility] [phone]).       Specialty medication(s) and dose(s) confirmed: Regimen is correct and unchanged.   Changes to medications: Jasmine Mitchell reports no changes at this time.  Changes to insurance: No  Questions for the pharmacist: No    Confirmed patient received Welcome Packet with first shipment. The patient will receive a drug information handout for each medication shipped and additional FDA Medication Guides as required.       DISEASE/MEDICATION-SPECIFIC INFORMATION        N/A    SPECIALTY MEDICATION ADHERENCE     Medication Adherence    Patient reported X missed doses in the last month:  0  Specialty Medication:  humira  Patient is on additional specialty medications:  No  Patient is on more than two specialty medications:  No  Any gaps in refill history greater than 2 weeks in the last 3 months:  no  Informant:  patient  Reliability of informant:  reliable  Confirmed plan for next specialty medication refill:  delivery by pharmacy  Refills needed for supportive medications:  not needed          Refill Coordination    Has the Patients' Contact Information Changed:  No  Is the Shipping Address Different:  No           Humira injections. Patient has no medication remaining. Next dose due 4/18      SHIPPING     Shipping address confirmed in Epic.     Delivery Scheduled: Yes, Expected medication delivery date: 041720.     Medication will be delivered via UPS to the home address in Epic WAM.    Jasmine Mitchell   [facility] Specialty Technician

## 2018-10-04 MED FILL — HUMIRA PEN CITRATE FREE 40 MG/0.4 ML: 28 days supply | Qty: 4 | Fill #0 | Status: AC

## 2018-10-15 ENCOUNTER — Other Ambulatory Visit: Payer: Self-pay | Admitting: Family Medicine

## 2018-10-24 NOTE — Unmapped (Signed)
[facility] Refill Coordination Note    Specialty Medication(s) to be Shipped:   General Specialty: Humira 40mg /0.12ml    Other medication(s) to be shipped:       Jasmine Mitchell, DOB: March 06, 1971  Phone: There are no phone numbers on file.      All above HIPAA information was verified with patient.     Completed refill call assessment today to schedule patient's medication shipment from the [facility] [phone]).       Specialty medication(s) and dose(s) confirmed: Regimen is correct and unchanged.   Changes to medications: [name] reports no changes at this time.  Changes to insurance: No  Questions for the pharmacist: No    Confirmed patient received Welcome Packet with first shipment. The patient will receive a drug information handout for each medication shipped and additional FDA Medication Guides as required.       DISEASE/MEDICATION-SPECIFIC INFORMATION        N/A    SPECIALTY MEDICATION ADHERENCE     Medication Adherence    Patient reported X missed doses in the last month:  0  Specialty Medication:  Humira 40mg /0.56ml                Humira 40/0.4 mg/ml: 7 days of medicine on hand       SHIPPING     Shipping address confirmed in Epic.     Delivery Scheduled: Yes, Expected medication delivery date: 05/12.     Medication will be delivered via UPS to the home address in Epic WAM.    Jasmine Mitchell   [facility] Specialty Technician

## 2018-10-29 MED FILL — HUMIRA PEN CITRATE FREE 40 MG/0.4 ML: 28 days supply | Qty: 4 | Fill #1 | Status: AC

## 2018-10-29 MED FILL — HUMIRA PEN CITRATE FREE 40 MG/0.4 ML: 28 days supply | Qty: 4 | Fill #1

## 2018-11-14 NOTE — Unmapped (Addendum)
Patient was called to schedule Pre-Test for <DATE>01/26/19 at 9am in [facility] Quick Test [facility]   Did patient confirm appointment? Yes    If No, what action was taken?       ----- Message from Jasmine Mitchell sent at 11/14/2018  8:40 AM EDT -----  Regarding: COVID SCREENING CLOSER HOME  Patient has a care plan that requires asymptomatic respiratory testing prior to implementation.   The date 01/28/2019, time 8:00 of the treatment, surgery, or procedure for which they need testing is 8:00.    Please schedule a COVID PRE TEST appointment in [facility] Quick Test [facility].    CLOSER TO HOME IF POSSIBLE.    Thank you!

## 2018-11-15 NOTE — Unmapped (Signed)
[facility] Pharm[facility] Refill Coordination Note    Specialty Medication(s) to be Shipped:   General Specialty: humira 40mg /0.23ml    Other medication(s) to be shipped:       Jasmine Mitchell, DOB: 05-Feb-1971  Phone: [phone] (home)       All above HIPAA information was verified with patient.     Completed refill call assessment today to schedule patient's medication shipment from the [facility] [phone]).       Specialty medication(s) and dose(s) confirmed: Regimen is correct and unchanged.   Changes to medications: Jasmine Mitchell reports no changes at this time.  Changes to insurance: No  Questions for the pharmacist: No    Confirmed patient received Welcome Packet with first shipment. The patient will receive a drug information handout for each medication shipped and additional FDA Medication Guides as required.       DISEASE/MEDICATION-SPECIFIC INFORMATION        N/A    SPECIALTY MEDICATION ADHERENCE     Medication Adherence    Patient reported X missed doses in the last month:  0  Specialty Medication:  humira 40mg /0.33ml                Humira 40mg /0.15ml: 7 days of medicine on hand         SHIPPING     Shipping address confirmed in Epic.     Delivery Scheduled: Yes, Expected medication delivery date: 06/04.     Medication will be delivered via UPS to the home address in Epic WAM.    Jasmine Mitchell   [facility] Specialty Technician

## 2018-11-21 MED FILL — HUMIRA PEN CITRATE FREE 40 MG/0.4 ML: 28 days supply | Qty: 4 | Fill #2 | Status: AC

## 2018-11-21 MED FILL — HUMIRA PEN CITRATE FREE 40 MG/0.4 ML: 28 days supply | Qty: 4 | Fill #2

## 2018-11-29 ENCOUNTER — Telehealth: Payer: Self-pay | Admitting: *Deleted

## 2018-11-29 ENCOUNTER — Other Ambulatory Visit: Payer: Self-pay

## 2018-11-29 ENCOUNTER — Ambulatory Visit (INDEPENDENT_AMBULATORY_CARE_PROVIDER_SITE_OTHER): Payer: BC Managed Care – PPO | Admitting: Family Medicine

## 2018-11-29 ENCOUNTER — Encounter: Payer: Self-pay | Admitting: Family Medicine

## 2018-11-29 DIAGNOSIS — R300 Dysuria: Secondary | ICD-10-CM | POA: Diagnosis not present

## 2018-11-29 MED ORDER — NITROFURANTOIN MONOHYD MACRO 100 MG PO CAPS: 100.0000 mg | ORAL_CAPSULE | Freq: Two times a day (BID) | ORAL | 0 refills | Status: DC

## 2018-11-29 NOTE — Telephone Encounter (Signed)
-----   Message from Lucretia Kern, DO sent at 11/29/2018 12:27 PM EDT ----- -patient wants to CANCEL visit with Dr. Majel Homer prefers to stay with me and will do a meet and greet/follow up with Dr. Ethlyn Gallery in 3 months

## 2018-11-29 NOTE — Progress Notes (Signed)
Virtual Visit via Video Note  I connected with Katrina Grant  on 11/29/18 at 12:05 PM EDT by a video enabled telemedicine application and verified that I am speaking with the correct person using two identifiers.  Location patient: home Location provider:work or home office Persons participating in the virtual visit: patient, provider  I discussed the limitations of evaluation and management by telemedicine and the availability of in person appointments. The patient expressed understanding and agreed to proceed.   HPI:  Acute visit for Dysuria: -she had symptoms about a month ago and took 4 days of macrobid her daughter had and it cleared up, but then came back -symptoms recurred the last 1 week -symptoms include urgency, frequency, odor to urine, dysuria -denies: fevers, malaise, hematuria, NVD, abd/pelvic pain, flank, vaginal discharge -hx of prior UTIs and feels the same -denies pregnancy, she is perimenopausal so no periods for several months   ROS: See pertinent positives and negatives per HPI.  Past Medical History:  Diagnosis Date  . ADD (attention deficit disorder)   . Anxiety   . Cholesterol serum elevated    on medication in the past  . Chronic kidney disease   . Crohn's disease of colon with fistula ([place])    sees Dr. Coralyn Mark, [facility]  . Depression   . Fibromyalgia - managed by Dr. Dossie Der, [facility] - takes Neurontin and Tramadol 11/20/2013  . Frequent headaches   . GERD (gastroesophageal reflux disease)   . Osteoarthritis    knee, spine  . Pneumonia   . Syncope     Past Surgical History:  Procedure Laterality Date  . anal fistula repair      X 4   . colonoscopy with dilation     X 2   . FRACTURE SURGERY    . TUBAL LIGATION      Family History  Problem Relation Age of Onset  . Hypertension Mother   . ADD / ADHD Mother   . Anxiety disorder Mother   . Arthritis Mother   . Depression Mother   . Varicose Veins Mother   . Diabetes Father   . Hearing loss  Father   . Heart disease Father   . Cancer Sister        cervical  . ADD / ADHD Sister   . Anxiety disorder Sister   . Arthritis Sister   . Depression Sister   . Early death Sister   . Varicose Veins Sister   . Mental illness Brother        drugs and alcohol abuse  . ADD / ADHD Brother   . Alcohol abuse Brother   . Asthma Brother   . Depression Brother   . Drug abuse Brother   . ADD / ADHD Daughter     SOCIAL HX: see hpi   Current Outpatient Medications:  .  ACCU-CHEK FASTCLIX LANCETS MISC, Use as directed to check blood sugar once day, Disp: 102 each, Rfl: 0 .  acetaminophen (TYLENOL) 500 MG tablet, Take 1,000 mg by mouth every 6 (six) hours as needed. For migraines and stomach upset , Disp: , Rfl:  .  Adalimumab (HUMIRA) 40 MG/0.8ML PSKT, Inject 40 mg into the skin once a week., Disp: , Rfl:  .  dicyclomine (BENTYL) 10 MG capsule, Take 10 mg by mouth 2 (two) times daily., Disp: , Rfl:  .  fluticasone (FLONASE) 50 MCG/ACT nasal spray, PLACE 1 SPRAY INTO BOTH NOSTRILS DAILY., Disp: 16 g, Rfl: 1 .  glucose blood (ACCU-CHEK  GUIDE) test strip, Use as instructed, Disp: 100 each, Rfl: 1 .  ondansetron (ZOFRAN ODT) 8 MG disintegrating tablet, Take 1 tablet (8 mg total) by mouth every 8 (eight) hours as needed for nausea or vomiting., Disp: 30 tablet, Rfl: 0 .  prenatal vitamin w/FE, FA (PRENATAL 1 + 1) 27-1 MG TABS tablet, Take 1 tablet by mouth daily before breakfast., Disp: 30 each, Rfl: 11 .  desipramine (NORPRAMIN) 25 MG tablet, Take by mouth., Disp: , Rfl:  .  nitrofurantoin, macrocrystal-monohydrate, (MACROBID) 100 MG capsule, Take 1 capsule (100 mg total) by mouth 2 (two) times daily., Disp: 14 capsule, Rfl: 0  EXAM:  VITALS per patient if applicable:denies fever  GENERAL: alert, oriented, appears well and in no acute distress  HEENT: atraumatic, conjunttiva clear, no obvious abnormalities on inspection of external nose and ears  NECK: normal movements of the head and  neck  LUNGS: on inspection no signs of respiratory distress, breathing rate appears normal, no obvious gross SOB, gasping or wheezing  CV: no obvious cyanosis  MS: moves all visible extremities without noticeable abnormality  PSYCH/NEURO: pleasant and cooperative, no obvious depression or anxiety, speech and thought processing grossly intact  ASSESSMENT AND PLAN:  Discussed the following assessment and plan:  Dysuria - Plan:  -discussed potential etiologies, options for evaluation, treatment options, risks and return precautions -she prefers ot avoid in person exams in light of the COVID19 pandemic -opted to treat empirically with macrobid     I discussed the assessment and treatment plan with the patient. The patient was provided an opportunity to ask questions and all were answered. The patient agreed with the plan and demonstrated an understanding of the instructions.   The patient was advised to call back or seek an in-person evaluation if the symptoms worsen or if the condition fails to improve as anticipated.   Follow up instructions: Advised assistant Wendie Simmer to help patient arrange the following: -patient wants to CANCEL visit with Dr. Jerilee Hoh -she prefers to stay with me and will do a meet and greet/follow up with Dr. Ethlyn Gallery in 3 months  Lucretia Kern, DO

## 2018-11-29 NOTE — Patient Instructions (Signed)
-  take the Macrobid as prescribed.  -follow up if any worsening or recurrent symptoms.  -Meet and greet with Dr. Ethlyn Gallery in 3 months - otherwise can see me for follow ups and any concerns that arise.

## 2018-11-29 NOTE — Telephone Encounter (Signed)
I left a detailed message at the pts cell number the appt was cancelled and asked that she call back to schedule a visit with Dr Ethlyn Gallery as below.

## 2018-12-05 ENCOUNTER — Encounter: Payer: Self-pay | Admitting: Internal Medicine

## 2018-12-19 MED ORDER — PEG-ELECTROLYTE SOLUTION 420 GRAM ORAL SOLUTION
Freq: Once | ORAL | 0 refills | 0 days | Status: CP
Start: 2018-12-19 — End: 2018-12-19

## 2018-12-20 NOTE — Unmapped (Signed)
[facility] Clinical Assessment & Refill Coordination Note    Jasmine Mitchell, [place]: [birth year]  Phone: [phone] (home)     All above HIPAA information was verified with patient.     Specialty Medication(s):   Inflammatory Disorders: Humira     Current Outpatient Medications   Medication Sig Dispense Refill   ??? acetaminophen (TYLENOL) 500 MG tablet Take 1,000 mg by mouth every four (4) hours as needed for pain.     ??? ADALIMUMAB PEN CITRATE FREE 40 MG/0.4 ML INJECT THE CONTENTS OF 1 PEN (40 MG TOTAL) UNDER THE SKIN EVERY SEVEN (7) DAYS. 4 each 5   ??? cholestyramine-aspartame (CHOLESTYRAMINE LIGHT) 4 gram PwPk Take 1 packet by mouth Two (2) times a day. (Patient not taking: Reported on 12/08/2017) 180 packet 3   ??? ciprofloxacin HCl (CIPRO) 500 MG tablet TAKE 1 TABLET BY MOUTH 2 TIMES A DAY (Patient not taking: Reported on 07/25/2017) 60 tablet 2   ??? desipramine (NORPRAMIN) 25 MG tablet Take 3 tablets (75 mg total) by mouth daily. 90 tablet 6   ??? dicyclomine (BENTYL) 10 mg capsule TAKE 1 CAPSULE BY MOUTH 3 TIMES A DAY 90 capsule 3   ??? fluticasone (FLONASE) 50 mcg/actuation nasal spray 1 SPRAY BY EACH NARE ROUTE DAILY. AS DIRECTED (Patient taking differently: 1 spray by Each Nare route daily as needed. As directed) 16 mL 2   ??? loratadine (CLARITIN) 10 mg tablet Take 10 mg by mouth once as needed.      ??? MULTIVIT WITH CALCIUM,IRON,MIN (WOMEN'S DAILY MULTIVITAMIN ORAL) Take 1 tablet by mouth once daily.     ??? ondansetron (ZOFRAN-ODT) 8 MG disintegrating tablet Take 8 mg by mouth every eight (8) hours as needed.      ??? oxyCODONE (ROXICODONE) 5 MG immediate release tablet Take 1 tablet (5 mg total) by mouth every six (6) hours as needed for pain. (Patient not taking: Reported on 01/20/2017) 10 tablet 0     No current facility-administered medications for this visit.         Changes to medications: Jasmine Mitchell reports no changes at this time.    Allergies   Allergen Reactions   ??? Oxycodone (Bulk) Nausea And Vomiting     Patient tolerates acetaminophen well       Changes to allergies: No    SPECIALTY MEDICATION ADHERENCE     Humira 40mg /0.36ml: 7 days of medicine on hand     Medication Adherence    Patient reported X missed doses in the last month:  0  Specialty Medication:  Humira 40mg /0.37ml          Specialty medication(s) dose(s) confirmed: Regimen is correct and unchanged.     Are there any concerns with adherence? No    Adherence counseling provided? Not needed    CLINICAL MANAGEMENT AND INTERVENTION      Clinical Benefit Assessment:    Do you feel the medicine is effective or helping your condition? Yes    Clinical Benefit counseling provided? Not needed    Adverse Effects Assessment:    Are you experiencing any side effects? No    Are you experiencing difficulty administering your medicine? No    Quality of Life Assessment:    How many days over the past month did your Crohn's Disease keep you from your normal activities? For example, brushing your teeth or getting up in the morning. Patient declined to answer    Have you discussed this with your provider?  Not needed    Therapy Appropriateness:    Is therapy appropriate? Yes, therapy is appropriate and should be continued    DISEASE/MEDICATION-SPECIFIC INFORMATION      For patients on injectable medications: Patient currently has 1 doses left.  Next injection is scheduled for 12/21/2018.    PATIENT SPECIFIC NEEDS     ? Does the patient have any physical, cognitive, or cultural barriers? No    ? Is the patient high risk? No     ? Does the patient require a Care Management Plan? No     ? Does the patient require physician intervention or other additional services (i.e. nutrition, smoking cessation, social work)? No      SHIPPING     Specialty Medication(s) to be Shipped:   Inflammatory Disorders: Humira 40mg /0.90ml    Other medication(s) to be shipped: none       Changes to insurance: No    Delivery Scheduled: Yes, Expected medication delivery date: 12/26/2018.     Medication will be delivered via UPS to the confirmed home address in [facility].    The patient will receive a drug information handout for each medication shipped and additional FDA Medication Guides as required.  Verified that patient has previously received a Conservation officer, historic buildings.    All of the patient's questions and concerns have been addressed.    Jasmine Mitchell   [facility] Specialty Pharmacist

## 2018-12-25 MED FILL — HUMIRA PEN CITRATE FREE 40 MG/0.4 ML: 28 days supply | Qty: 4 | Fill #3

## 2018-12-25 MED FILL — HUMIRA PEN CITRATE FREE 40 MG/0.4 ML: 28 days supply | Qty: 4 | Fill #3 | Status: AC

## 2018-12-26 ENCOUNTER — Other Ambulatory Visit: Payer: Self-pay | Admitting: Family Medicine

## 2018-12-26 DIAGNOSIS — R399 Unspecified symptoms and signs involving the genitourinary system: Secondary | ICD-10-CM | POA: Diagnosis not present

## 2018-12-26 DIAGNOSIS — Z0000 Encounter for general adult medical examination without abnormal findings: Secondary | ICD-10-CM

## 2019-01-09 NOTE — Unmapped (Signed)
[facility] Refill Coordination Note    Specialty Medication(s) to be Shipped:   General Specialty: humira 40/0.67ml    Other medication(s) to be shipped:       Jasmine Mitchell, DOB: [birth year]  Phone: There are no phone numbers on file.      All above HIPAA information was verified with patient.     Completed refill call assessment today to schedule patient's medication shipment from the [facility] [phone]).       Specialty medication(s) and dose(s) confirmed: Regimen is correct and unchanged.   Changes to medications: [place] reports no changes at this time.  Changes to insurance: No  Questions for the pharmacist: No    Confirmed patient received Welcome Packet with first shipment. The patient will receive a drug information handout for each medication shipped and additional FDA Medication Guides as required.       DISEASE/MEDICATION-SPECIFIC INFORMATION        N/A    SPECIALTY MEDICATION ADHERENCE     Medication Adherence    Patient reported X missed doses in the last month: 0  Specialty Medication: humira 40mg /0.9ml  Patient is on additional specialty medications: No                Humira 40/0.4  mg/ml: 14 days of medicine on hand       SHIPPING     Shipping address confirmed in Epic.     Delivery Scheduled: Yes, Expected medication delivery date: 07/04.     Medication will be delivered via UPS to the home address in Epic WAM.    [name]   [facility] Specialty Technician

## 2019-01-11 ENCOUNTER — Ambulatory Visit (INDEPENDENT_AMBULATORY_CARE_PROVIDER_SITE_OTHER): Payer: BC Managed Care – PPO | Admitting: Family Medicine

## 2019-01-11 ENCOUNTER — Other Ambulatory Visit: Payer: Self-pay

## 2019-01-11 ENCOUNTER — Encounter: Payer: Self-pay | Admitting: Family Medicine

## 2019-01-11 DIAGNOSIS — R059 Cough, unspecified: Secondary | ICD-10-CM

## 2019-01-11 DIAGNOSIS — Z20822 Contact with and (suspected) exposure to covid-19: Secondary | ICD-10-CM

## 2019-01-11 DIAGNOSIS — R05 Cough: Secondary | ICD-10-CM

## 2019-01-11 DIAGNOSIS — Z20828 Contact with and (suspected) exposure to other viral communicable diseases: Secondary | ICD-10-CM | POA: Diagnosis not present

## 2019-01-11 NOTE — Progress Notes (Signed)
Virtual Visit via Video Note  I connected with Katrina Grant on 01/11/19 at 11:30 AM EDT by a video enabled telemedicine application and verified that I am speaking with the correct person using two identifiers.  Location patient: home Location provider:work or home office Persons participating in the virtual visit: patient, provider  I discussed the limitations of evaluation and management by telemedicine and the availability of in person appointments. The patient expressed understanding and agreed to proceed.   HPI: Pt with coughing, HA, scratchy throat x 2-3 days. Pt denies fever, rhinorrhea, ear pain, n/v.  Pt has chron's dz so upset stomach is normal for her.  On humira. Had to leave work last wk 2/2 diarrhea, but has improved.  States one of her husbands co-workers tested positive for COVID-19.  Pt's husband is not sick.    Pt is working from home today.  Was in the office.   ROS: See pertinent positives and negatives per HPI.  Past Medical History:  Diagnosis Date  . ADD (attention deficit disorder)   . Anxiety   . Cholesterol serum elevated    on medication in the past  . Chronic kidney disease   . Crohn's disease of colon with fistula ([place])    sees Dr. Coralyn Mark, [facility]  . Depression   . Fibromyalgia - managed by Dr. Dossie Der, [facility] - takes Neurontin and Tramadol 11/20/2013  . Frequent headaches   . GERD (gastroesophageal reflux disease)   . Osteoarthritis    knee, spine  . Pneumonia   . Syncope     Past Surgical History:  Procedure Laterality Date  . anal fistula repair      X 4   . colonoscopy with dilation     X 2   . FRACTURE SURGERY    . TUBAL LIGATION      Family History  Problem Relation Age of Onset  . Hypertension Mother   . ADD / ADHD Mother   . Anxiety disorder Mother   . Arthritis Mother   . Depression Mother   . Varicose Veins Mother   . Diabetes Father   . Hearing loss Father   . Heart disease Father   . Cancer Sister        cervical   . ADD / ADHD Sister   . Anxiety disorder Sister   . Arthritis Sister   . Depression Sister   . Early death Sister   . Varicose Veins Sister   . Mental illness Brother        drugs and alcohol abuse  . ADD / ADHD Brother   . Alcohol abuse Brother   . Asthma Brother   . Depression Brother   . Drug abuse Brother   . ADD / ADHD Daughter     Current Outpatient Medications:  .  ACCU-CHEK FASTCLIX LANCETS MISC, Use as directed to check blood sugar once day, Disp: 102 each, Rfl: 0 .  acetaminophen (TYLENOL) 500 MG tablet, Take 1,000 mg by mouth every 6 (six) hours as needed. For migraines and stomach upset , Disp: , Rfl:  .  Adalimumab (HUMIRA) 40 MG/0.8ML PSKT, Inject 40 mg into the skin once a week., Disp: , Rfl:  .  desipramine (NORPRAMIN) 25 MG tablet, Take by mouth., Disp: , Rfl:  .  dicyclomine (BENTYL) 10 MG capsule, Take 10 mg by mouth 2 (two) times daily., Disp: , Rfl:  .  fluticasone (FLONASE) 50 MCG/ACT nasal spray, PLACE 1 SPRAY INTO BOTH NOSTRILS DAILY., Disp:  16 g, Rfl: 1 .  glucose blood (ACCU-CHEK GUIDE) test strip, Use as instructed, Disp: 100 each, Rfl: 1 .  nitrofurantoin, macrocrystal-monohydrate, (MACROBID) 100 MG capsule, Take 1 capsule (100 mg total) by mouth 2 (two) times daily., Disp: 14 capsule, Rfl: 0 .  ondansetron (ZOFRAN ODT) 8 MG disintegrating tablet, Take 1 tablet (8 mg total) by mouth every 8 (eight) hours as needed for nausea or vomiting., Disp: 30 tablet, Rfl: 0 .  prenatal vitamin w/FE, FA (PRENATAL 1 + 1) 27-1 MG TABS tablet, Take 1 tablet by mouth daily before breakfast., Disp: 30 each, Rfl: 11  EXAM:  VITALS per patient if applicable: RR between 98-72 bpm  GENERAL: alert, oriented, appears well and in no acute distress  HEENT: atraumatic, conjunctiva clear, no obvious abnormalities on inspection of external nose and ears  NECK: normal movements of the head and neck  LUNGS: on inspection no signs of respiratory distress, breathing rate appears  normal, no obvious gross SOB, gasping or wheezing  CV: no obvious cyanosis  MS: moves all visible extremities without noticeable abnormality  PSYCH/NEURO: pleasant and cooperative, no obvious depression or anxiety, speech and thought processing grossly intact  ASSESSMENT AND PLAN:  Discussed the following assessment and plan:  Exposure to Covid-19 Virus -discussed symptoms possibly 2/2 seasonal allergies, chronic conditions (chron's dz) or viral etiology- cannot r/o COVID infection.  - Plan: Novel Coronavirus, NAA ([facility]) -pt advised to self quarantine -given precautions  Cough  -supportive care -advised to try OTC medications. -consider symptoms 2/2 seasonal allergies  -f/u prn    I discussed the assessment and treatment plan with the patient. The patient was provided an opportunity to ask questions and all were answered. The patient agreed with the plan and demonstrated an understanding of the instructions.   The patient was advised to call back or seek an in-person evaluation if the symptoms worsen or if the condition fails to improve as anticipated.  Billie Ruddy, MD

## 2019-01-15 LAB — NOVEL CORONAVIRUS, NAA: SARS-CoV-2, NAA: NOT DETECTED

## 2019-01-21 MED FILL — HUMIRA PEN CITRATE FREE 40 MG/0.4 ML: 28 days supply | Qty: 4 | Fill #4

## 2019-01-21 MED FILL — HUMIRA PEN CITRATE FREE 40 MG/0.4 ML: 28 days supply | Qty: 4 | Fill #4 | Status: AC

## 2019-01-25 DIAGNOSIS — R399 Unspecified symptoms and signs involving the genitourinary system: Secondary | ICD-10-CM | POA: Diagnosis not present

## 2019-01-26 ENCOUNTER — Encounter: Admit: 2019-01-26 | Discharge: 2019-01-27 | Payer: PRIVATE HEALTH INSURANCE

## 2019-01-26 DIAGNOSIS — Z01812 Encounter for preprocedural laboratory examination: Secondary | ICD-10-CM | POA: Diagnosis not present

## 2019-01-26 DIAGNOSIS — Z1159 Encounter for screening for other viral diseases: Secondary | ICD-10-CM | POA: Diagnosis not present

## 2019-01-26 DIAGNOSIS — Z20828 Contact with and (suspected) exposure to other viral communicable diseases: Secondary | ICD-10-CM | POA: Diagnosis not present

## 2019-01-26 NOTE — Unmapped (Signed)
COVID Pre-Procedure Intake Form     Assessment     Jasmine Mitchell is a 48 y.o. female presenting to [facility] for COVID testing.     Plan     If no testing performed, pt counseled on routine care for respiratory illness.  If testing performed, COVID sent.  Patient directed to Home given findings during today's visit.    Subjective     Jasmine Mitchell is a 48 y.o. female who [facility] with complaints of the following:    Exposure History: In the last 21 days?     Have you traveled outside of [place]? No               Have you been in close contact with someone confirmed by a test to have COVID? (Close contact is within 6 feet for at least 10 minutes) No       Have you worked in a health care facility? No     Lived or worked facility like a nursing home, group home, or assisted living?    No         Are you scheduled to have surgery or a procedure in the next 3 days? Yes               Are you scheduled to receive cancer chemotherapy within the next 7 days?    No     Have you ever been tested before for COVID-19 with a swab of your nose? No   Are you a healthcare worker being tested so to return to work No     Right now,  do you have any of the following that developed over the past 7 days (as stated by patient on intake form):    Subjective fever (felt feverish) No   Chills (especially repeated shaking chills) No   Severe fatigue (felt very tired) No   Muscle aches No   Runny nose No   Sore throat No   Loss of taste or smell No   Cough (new onset or worsening of chronic cough) No   Shortness of breath No   Nausea or vomiting No   Headache No   Abdominal Pain No   Diarrhea (3 or more loose stools in last 24 hours) No       Scribe's Attestation: Cristin Mulderig Colford, MD obtained and performed the history, physical exam and medical decision making elements that were entered into the chart.  Signed by Alleen Borne serving as Scribe, on 01/26/2019 9:32 AM

## 2019-01-28 ENCOUNTER — Encounter
Admit: 2019-01-28 | Discharge: 2019-01-28 | Payer: PRIVATE HEALTH INSURANCE | Attending: Anesthesiology | Primary: Anesthesiology

## 2019-01-28 ENCOUNTER — Ambulatory Visit: Admit: 2019-01-28 | Discharge: 2019-01-28 | Payer: PRIVATE HEALTH INSURANCE

## 2019-01-28 DIAGNOSIS — E669 Obesity, unspecified: Secondary | ICD-10-CM | POA: Diagnosis not present

## 2019-01-28 DIAGNOSIS — Z1211 Encounter for screening for malignant neoplasm of colon: Secondary | ICD-10-CM | POA: Diagnosis not present

## 2019-01-28 DIAGNOSIS — K219 Gastro-esophageal reflux disease without esophagitis: Secondary | ICD-10-CM | POA: Diagnosis not present

## 2019-01-28 DIAGNOSIS — Z885 Allergy status to narcotic agent status: Secondary | ICD-10-CM | POA: Diagnosis not present

## 2019-01-28 DIAGNOSIS — K624 Stenosis of anus and rectum: Secondary | ICD-10-CM | POA: Diagnosis not present

## 2019-01-28 DIAGNOSIS — K5090 Crohn's disease, unspecified, without complications: Secondary | ICD-10-CM | POA: Diagnosis not present

## 2019-01-28 DIAGNOSIS — K5010 Crohn's disease of large intestine without complications: Secondary | ICD-10-CM | POA: Diagnosis not present

## 2019-01-28 DIAGNOSIS — M797 Fibromyalgia: Secondary | ICD-10-CM | POA: Diagnosis not present

## 2019-01-28 DIAGNOSIS — M1990 Unspecified osteoarthritis, unspecified site: Secondary | ICD-10-CM | POA: Diagnosis not present

## 2019-01-31 LAB — HM COLONOSCOPY

## 2019-02-06 NOTE — Unmapped (Signed)
[facility] Refill Coordination Note    Specialty Medication(s) to be Shipped:   General Specialty: humira 40mg /0.72ml    Other medication(s) to be shipped:       Jasmine Mitchell, DOB: August 25, 1970  Phone: There are no phone numbers on file.      All above HIPAA information was verified with patient.     Completed refill call assessment today to schedule patient's medication shipment from the [facility] [phone]).       Specialty medication(s) and dose(s) confirmed: Regimen is correct and unchanged.   Changes to medications: Jasmine Mitchell reports no changes at this time.  Changes to insurance: No  Questions for the pharmacist: No    Confirmed patient received Welcome Packet with first shipment. The patient will receive a drug information handout for each medication shipped and additional FDA Medication Guides as required.       DISEASE/MEDICATION-SPECIFIC INFORMATION        N/A    SPECIALTY MEDICATION ADHERENCE     Medication Adherence    Patient reported X missed doses in the last month: 0  Specialty Medication: humira  40mg /0.23ml  Patient is on additional specialty medications: No                Humira 40/0.4 mg/ml: 14 days of medicine on hand       SHIPPING     Shipping address confirmed in Epic.     Delivery Scheduled: Yes, Expected medication delivery date: 09/01.     Medication will be delivered via UPS to the home address in Epic WAM.    Jasmine Mitchell   [facility] Specialty Technician

## 2019-02-11 ENCOUNTER — Encounter: Payer: Self-pay | Admitting: Family Medicine

## 2019-02-18 MED FILL — HUMIRA PEN CITRATE FREE 40 MG/0.4 ML: 28 days supply | Qty: 4 | Fill #5 | Status: AC

## 2019-02-18 MED FILL — HUMIRA PEN CITRATE FREE 40 MG/0.4 ML: 28 days supply | Qty: 4 | Fill #5

## 2019-02-28 DIAGNOSIS — N390 Urinary tract infection, site not specified: Secondary | ICD-10-CM | POA: Diagnosis not present

## 2019-02-28 DIAGNOSIS — Z87440 Personal history of urinary (tract) infections: Secondary | ICD-10-CM | POA: Diagnosis not present

## 2019-03-15 ENCOUNTER — Other Ambulatory Visit: Payer: Self-pay

## 2019-03-15 DIAGNOSIS — R6889 Other general symptoms and signs: Secondary | ICD-10-CM | POA: Diagnosis not present

## 2019-03-15 DIAGNOSIS — Z20822 Contact with and (suspected) exposure to covid-19: Secondary | ICD-10-CM

## 2019-03-16 LAB — NOVEL CORONAVIRUS, NAA: SARS-CoV-2, NAA: NOT DETECTED

## 2019-03-22 NOTE — Unmapped (Signed)
The [facility] has made a third and final attempt to reach this patient to refill the following medication:Humira.      We have left voicemails on the following phone numbers: [phone].    Dates contacted: 09/21,09/28,10/02  Last scheduled delivery: 08/31    The patient may be at risk of non-compliance with this medication. The patient should call the [facility] at [phone] (option 4) to refill medication.    [name]   [facility] Specialty Technician

## 2019-04-01 DIAGNOSIS — K5080 Crohn's disease of both small and large intestine without complications: Principal | ICD-10-CM

## 2019-04-01 MED ORDER — ADALIMUMAB PEN CITRATE FREE 40 MG/0.4 ML: 40 mg | each | 11 refills | 28 days | Status: AC

## 2019-04-01 NOTE — Unmapped (Signed)
[facility] Refill Coordination Note    Specialty Medication(s) to be Shipped:   Inflammatory Disorders: Humira    Other medication(s) to be shipped: na     Jasmine Mitchell, DOB: [birth year]  Phone: There are no phone numbers on file.      All above HIPAA information was verified with patient.     Completed refill call assessment today to schedule patient's medication shipment from the [facility] [phone]).       Specialty medication(s) and dose(s) confirmed: Regimen is correct and unchanged.   Changes to medications: Floyce reports no changes at this time.  Changes to insurance: No  Questions for the pharmacist: No    Confirmed patient received Welcome Packet with first shipment. The patient will receive a drug information handout for each medication shipped and additional FDA Medication Guides as required.       DISEASE/MEDICATION-SPECIFIC INFORMATION        For patients on injectable medications: Patient currently has 0 doses left.  Next injection is scheduled for 100920.    SPECIALTY MEDICATION ADHERENCE     Medication Adherence    Patient reported X missed doses in the last month: 1  Specialty Medication: humira 40mg /0.91ml-pt thought she had 1 more pen left but did not  Patient is on additional specialty medications: No  Any gaps in refill history greater than 2 weeks in the last 3 months: no  Demonstrates understanding of importance of adherence: yes  Informant: patient  Reliability of informant: reliable  Confirmed plan for next specialty medication refill: delivery by pharmacy  Refills needed for supportive medications: not needed                humira 40mg /0.75ml. 0 on hand      SHIPPING     Shipping address confirmed in Epic.     Delivery Scheduled: Yes, Expected medication delivery date: 101420.  However, Rx request for refills was sent to the provider as there are none remaining.     Medication will be delivered via UPS to the home address in Epic WAM. Azani Brogdon D Brenden Rudman   [facility] Specialty Technician

## 2019-04-02 MED FILL — HUMIRA PEN CITRATE FREE 40 MG/0.4 ML: 28 days supply | Qty: 4 | Fill #0 | Status: AC

## 2019-04-02 MED FILL — HUMIRA PEN CITRATE FREE 40 MG/0.4 ML: SUBCUTANEOUS | 28 days supply | Qty: 4 | Fill #0

## 2019-04-03 DIAGNOSIS — R310 Gross hematuria: Secondary | ICD-10-CM | POA: Diagnosis not present

## 2019-04-03 DIAGNOSIS — N390 Urinary tract infection, site not specified: Secondary | ICD-10-CM | POA: Diagnosis not present

## 2019-04-29 ENCOUNTER — Telehealth (INDEPENDENT_AMBULATORY_CARE_PROVIDER_SITE_OTHER): Payer: BC Managed Care – PPO | Admitting: Family Medicine

## 2019-04-29 ENCOUNTER — Other Ambulatory Visit: Payer: Self-pay

## 2019-04-29 DIAGNOSIS — R509 Fever, unspecified: Secondary | ICD-10-CM | POA: Diagnosis not present

## 2019-04-29 DIAGNOSIS — Z87440 Personal history of urinary (tract) infections: Secondary | ICD-10-CM

## 2019-04-29 DIAGNOSIS — B349 Viral infection, unspecified: Secondary | ICD-10-CM

## 2019-04-29 DIAGNOSIS — Z20822 Contact with and (suspected) exposure to covid-19: Secondary | ICD-10-CM

## 2019-04-29 NOTE — Progress Notes (Signed)
Virtual Visit via Video Note  I connected with Katrina Grant on 04/29/19 at  1:30 PM EST by a video enabled telemedicine application 2/2 [id number] pandemic and verified that I am speaking with the correct person using two identifiers.  Location patient: home Location provider:work or home office Persons participating in the virtual visit: patient, provider  I discussed the limitations of evaluation and management by telemedicine and the availability of in person appointments. The patient expressed understanding and agreed to proceed.   HPI: Pt is a 48 yo female with pmh sig for fibromyalgia (on neurontin and tramadol- Dr. Dossie Der, [facility]) and Crohn's dz on Humira.   followed by Dr. Maudie Mercury.  Seen for acute concern of with dry cough, congestion, HA, fever x 2 days.  Tmax 101.3 F.  Taking tylenol.  Pt on humira injections.  Pt states throat has a hot feeling, but not sore.  Also endorses body aches, joint pain, and feels like the L side of her face is starting to get sore.  Pt had COVID testing this am- results pending.  Pt denies rhinorrhea, diarrhea.  Pt has a "get together" for her mother, 10-15 ppl during Halloween.  States no one from that has been sick and ppl wore their mask.  Pt's husband is not sick.  Pt states she has been having recurrent UTIs.  Pt states she started keflex today.  Was last on the abx at the end of Oct., given a rx with refills by her Urologist.  States started it because she had cloudy urine 2-3 days ago.   Awaiting cystoscopy, but cannot have done if has a UTI.   ROS: See pertinent positives and negatives per HPI.  Past Medical History:  Diagnosis Date  . ADD (attention deficit disorder)   . Anxiety   . Cholesterol serum elevated    on medication in the past  . Chronic kidney disease   . Crohn's disease of colon with fistula ([place])    sees Dr. Coralyn Mark, [facility]  . Depression   . Fibromyalgia - managed by Dr. Dossie Der, [facility] - takes Neurontin and Tramadol  11/20/2013  . Frequent headaches   . GERD (gastroesophageal reflux disease)   . Osteoarthritis    knee, spine  . Pneumonia   . Syncope     Past Surgical History:  Procedure Laterality Date  . anal fistula repair      X 4   . colonoscopy with dilation     X 2   . FRACTURE SURGERY    . TUBAL LIGATION      Family History  Problem Relation Age of Onset  . Hypertension Mother   . ADD / ADHD Mother   . Anxiety disorder Mother   . Arthritis Mother   . Depression Mother   . Varicose Veins Mother   . Diabetes Father   . Hearing loss Father   . Heart disease Father   . Cancer Sister        cervical  . ADD / ADHD Sister   . Anxiety disorder Sister   . Arthritis Sister   . Depression Sister   . Early death Sister   . Varicose Veins Sister   . Mental illness Brother        drugs and alcohol abuse  . ADD / ADHD Brother   . Alcohol abuse Brother   . Asthma Brother   . Depression Brother   . Drug abuse Brother   . ADD / ADHD Daughter  Current Outpatient Medications:  .  ACCU-CHEK FASTCLIX LANCETS MISC, Use as directed to check blood sugar once day, Disp: 102 each, Rfl: 0 .  acetaminophen (TYLENOL) 500 MG tablet, Take 1,000 mg by mouth every 6 (six) hours as needed. For migraines and stomach upset , Disp: , Rfl:  .  Adalimumab (HUMIRA) 40 MG/0.8ML PSKT, Inject 40 mg into the skin once a week., Disp: , Rfl:  .  desipramine (NORPRAMIN) 25 MG tablet, Take by mouth., Disp: , Rfl:  .  dicyclomine (BENTYL) 10 MG capsule, Take 10 mg by mouth 2 (two) times daily., Disp: , Rfl:  .  fluticasone (FLONASE) 50 MCG/ACT nasal spray, PLACE 1 SPRAY INTO BOTH NOSTRILS DAILY., Disp: 16 g, Rfl: 1 .  glucose blood (ACCU-CHEK GUIDE) test strip, Use as instructed, Disp: 100 each, Rfl: 1 .  nitrofurantoin, macrocrystal-monohydrate, (MACROBID) 100 MG capsule, Take 1 capsule (100 mg total) by mouth 2 (two) times daily., Disp: 14 capsule, Rfl: 0 .  ondansetron (ZOFRAN ODT) 8 MG disintegrating tablet,  Take 1 tablet (8 mg total) by mouth every 8 (eight) hours as needed for nausea or vomiting., Disp: 30 tablet, Rfl: 0 .  prenatal vitamin w/FE, FA (PRENATAL 1 + 1) 27-1 MG TABS tablet, Take 1 tablet by mouth daily before breakfast., Disp: 30 each, Rfl: 11  EXAM:  VITALS per patient if applicable: RR between 68-25 bpm  GENERAL: alert, oriented, appears well and in no acute distress  HEENT: atraumatic, conjunctiva clear, no obvious abnormalities on inspection of external nose and ears  NECK: normal movements of the head and neck  LUNGS: dry cough, on inspection no signs of respiratory distress, breathing rate appears normal, no obvious gross SOB, gasping or wheezing  CV: no obvious cyanosis  MS: moves all visible extremities without noticeable abnormality  PSYCH/NEURO: pleasant and cooperative, no obvious depression or anxiety, speech and thought processing grossly intact  ASSESSMENT AND PLAN:  Discussed the following assessment and plan:  Fever, unspecified fever  -discussed possible causes including viral (URI, influenza, or COVID-19), bacterial (UTI, pneumonia, etc) -continue supportive care: tylenol, ice -COVID results pending.  Advised to self quarantine  -given precautions for continued or worsening symptoms  Acute viral syndrome -supportive care  History of recurrent UTIs -continue keflex as prescribed by Urology -hydration encouraged -f/u with Urology    I discussed the assessment and treatment plan with the patient. The patient was provided an opportunity to ask questions and all were answered. The patient agreed with the plan and demonstrated an understanding of the instructions.   The patient was advised to call back or seek an in-person evaluation if the symptoms worsen or if the condition fails to improve as anticipated.  Billie Ruddy, MD

## 2019-04-29 NOTE — Unmapped (Signed)
[facility] Refill Coordination Note    Specialty Medication(s) to be Shipped:   General Specialty: humira 40mg /0.47ml    Other medication(s) to be shipped:       Jasmine Mitchell, DOB: 1971/02/09  Phone: There are no phone numbers on file.      All above HIPAA information was verified with patient.     Completed refill call assessment today to schedule patient's medication shipment from the [facility] [phone]).       Specialty medication(s) and dose(s) confirmed: Regimen is correct and unchanged.   Changes to medications: Stuart reports no changes at this time.  Changes to insurance: No  Questions for the pharmacist: No    Confirmed patient received Welcome Packet with first shipment. The patient will receive a drug information handout for each medication shipped and additional FDA Medication Guides as required.       DISEASE/MEDICATION-SPECIFIC INFORMATION        N/A    SPECIALTY MEDICATION ADHERENCE     Medication Adherence    Patient reported X missed doses in the last month: 0  Specialty Medication: humira 40mg /0.71ml  Patient is on additional specialty medications: No  Informant: patient                Humira 40/0.4 mg/ml: 0 days of medicine on hand         SHIPPING     Shipping address confirmed in Epic.     Delivery Scheduled: Yes, Expected medication delivery date: 11/11.     Medication will be delivered via UPS to the prescription address in Epic WAM.    [name]   [facility] Specialty Technician

## 2019-04-30 LAB — NOVEL CORONAVIRUS, NAA: SARS-CoV-2, NAA: DETECTED — AB

## 2019-04-30 MED FILL — HUMIRA PEN CITRATE FREE 40 MG/0.4 ML: 28 days supply | Qty: 4 | Fill #1 | Status: AC

## 2019-04-30 MED FILL — HUMIRA PEN CITRATE FREE 40 MG/0.4 ML: SUBCUTANEOUS | 28 days supply | Qty: 4 | Fill #1

## 2019-05-03 NOTE — Unmapped (Addendum)
Pt called stating she tested positive for COVID-19, started experiencing symptoms (fever, upper respiratory symptoms, cough, heaviness to chest)  last Friday, on Monday got tested and received a positive test on Tuesday. Her cough has improved, fever broke on Wednesday, but since yesterday she has had GI upset and diarrhea. 2 diarrhea episodes yesterday, at 4:30am today she woke up needing to have BM, ate this morning again had diarrhea, but after those episodes she has improved. Had nausea yesterday, improving today.  Due today for Humira which she will hold, had her  last dose of Humira last Friday (day her COVID symptoms started).   Will discuss with Dr. Raphael Gibney when she can resume Humira.   Per Dr. Raphael Gibney - she may resume Humira 3 weeks from her last dose assuming she is feeling well at that time.     Relayed this information to pt. She will let us know if anything arises.

## 2019-05-06 ENCOUNTER — Ambulatory Visit: Payer: Self-pay

## 2019-05-06 NOTE — Telephone Encounter (Signed)
Outgoing call to Patient with c/o continuous coughing.  Cough is rated 6 Denies hemoptysis Pt plans on getting cough medications brought to her. Denies cardiac history.  Denies traveling outside the country.  Provided care advice.  Pt. Voiced understanding.  Patient will follow up with Dr.  Colin Benton  If cough persists. After trying  Cough medication.                 Reason for Disposition . Cough has been present for > 3 weeks  Answer Assessment - Initial Assessment Questions 1. ONSET: "When did the cough begin?"    Off and on since last Monday 2. SEVERITY: "How bad is the cough today?"       Rated 6 3. RESPIRATORY DISTRESS: "Describe your breathing."      A winded when talking alot 4. FEVER: "Do you have a fever?" If so, ask: "What is your temperature, how was it measured, and when did it start?"    100 5. HEMOPTYSIS: "Are you coughing up any blood?" If so ask: "How much?" (flecks, streaks, tablespoons, etc.)    Denies  6. TREATMENT: "What have you done so far to treat the cough?" (e.g., meds, fluids, humidifier)     Will get cough  7. CARDIAC HISTORY: "Do you have any history of heart disease?" (e.g., heart attack, congestive heart failure)     denies 8. LUNG HISTORY: "Do you have any history of lung disease?"  (e.g., pulmonary embolus, asthma, emphysema)     denies 9. PE RISK FACTORS: "Do you have a history of blood clots?" (or: recent major surgery, recent prolonged travel, bedridden)     *No Answer* 10. OTHER SYMPTOMS: "Do you have any other symptoms? (e.g., runny nose, wheezing, chest pain)       denies 11. PREGNANCY: "Is there any chance you are pregnant?" "When was your last menstrual period?"       na 12. TRAVEL: "Have you traveled out of the country in the last month?" (e.g., travel history, exposures)       denies  Protocols used: COUGH - ACUTE NON-PRODUCTIVE-A-AH

## 2019-05-07 ENCOUNTER — Telehealth (INDEPENDENT_AMBULATORY_CARE_PROVIDER_SITE_OTHER): Payer: BC Managed Care – PPO | Admitting: Family Medicine

## 2019-05-07 ENCOUNTER — Encounter: Payer: Self-pay | Admitting: Family Medicine

## 2019-05-07 ENCOUNTER — Other Ambulatory Visit: Payer: Self-pay

## 2019-05-07 VITALS — Temp 98.4°F

## 2019-05-07 DIAGNOSIS — R05 Cough: Secondary | ICD-10-CM

## 2019-05-07 DIAGNOSIS — U071 COVID-19: Secondary | ICD-10-CM

## 2019-05-07 DIAGNOSIS — R059 Cough, unspecified: Secondary | ICD-10-CM

## 2019-05-07 MED ORDER — BENZONATATE 200 MG PO CAPS: 200.0000 mg | ORAL_CAPSULE | Freq: Two times a day (BID) | ORAL | 0 refills | Status: DC | PRN

## 2019-05-07 NOTE — Progress Notes (Signed)
Virtual Visit via Video Note  I connected with Verdis Frederickson  on 05/07/19 at  3:00 PM EST by a video enabled telemedicine application and verified that I am speaking with the correct person using two identifiers.  Location patient: home Location provider:work or home office Persons participating in the virtual visit: patient, provider  I discussed the limitations of evaluation and management by telemedicine and the availability of in person appointments. The patient expressed understanding and agreed to proceed.   HPI:  Acute visit for cough: -got sick 11/7 after small gathering with family; there was also an outbreak at her office -symptoms included fever, cough, body aches, congestion at visit 11/9 -COVID testing positive from 11/9 -today reports: she has persistent cough, she is exhausted, she has not had a fever in a few days, no sense of smell and loss of taste -she is sleeping well and doesn't feel bad at this point -denies: SOB, wheezing, fevers anymore -her humira was stopped during this illness by her specialist  ROS: See pertinent positives and negatives per HPI.  Past Medical History:  Diagnosis Date  . ADD (attention deficit disorder)   . Anxiety   . Cholesterol serum elevated    on medication in the past  . Chronic kidney disease   . Crohn's disease of colon with fistula ([place])    sees Dr. Coralyn Mark, [facility]  . Depression   . Fibromyalgia - managed by Dr. Dossie Der, [facility] - takes Neurontin and Tramadol 11/20/2013  . Frequent headaches   . GERD (gastroesophageal reflux disease)   . Osteoarthritis    knee, spine  . Pneumonia   . Syncope     Past Surgical History:  Procedure Laterality Date  . anal fistula repair      X 4   . colonoscopy with dilation     X 2   . FRACTURE SURGERY    . TUBAL LIGATION      Family History  Problem Relation Age of Onset  . Hypertension Mother   . ADD / ADHD Mother   . Anxiety disorder Mother   . Arthritis Mother   . Depression  Mother   . Varicose Veins Mother   . Diabetes Father   . Hearing loss Father   . Heart disease Father   . Cancer Sister        cervical  . ADD / ADHD Sister   . Anxiety disorder Sister   . Arthritis Sister   . Depression Sister   . Early death Sister   . Varicose Veins Sister   . Mental illness Brother        drugs and alcohol abuse  . ADD / ADHD Brother   . Alcohol abuse Brother   . Asthma Brother   . Depression Brother   . Drug abuse Brother   . ADD / ADHD Daughter     SOCIAL HX: see HPI   Current Outpatient Medications:  .  ACCU-CHEK FASTCLIX LANCETS MISC, Use as directed to check blood sugar once day, Disp: 102 each, Rfl: 0 .  acetaminophen (TYLENOL) 500 MG tablet, Take 1,000 mg by mouth every 6 (six) hours as needed. For migraines and stomach upset , Disp: , Rfl:  .  Adalimumab (HUMIRA) 40 MG/0.8ML PSKT, Inject 40 mg into the skin once a week., Disp: , Rfl:  .  dicyclomine (BENTYL) 10 MG capsule, Take 10 mg by mouth 2 (two) times daily., Disp: , Rfl:  .  fluticasone (FLONASE) 50 MCG/ACT nasal spray,  PLACE 1 SPRAY INTO BOTH NOSTRILS DAILY., Disp: 16 g, Rfl: 1 .  glucose blood (ACCU-CHEK GUIDE) test strip, Use as instructed, Disp: 100 each, Rfl: 1 .  nitrofurantoin, macrocrystal-monohydrate, (MACROBID) 100 MG capsule, Take 1 capsule (100 mg total) by mouth 2 (two) times daily., Disp: 14 capsule, Rfl: 0 .  ondansetron (ZOFRAN ODT) 8 MG disintegrating tablet, Take 1 tablet (8 mg total) by mouth every 8 (eight) hours as needed for nausea or vomiting., Disp: 30 tablet, Rfl: 0 .  prenatal vitamin w/FE, FA (PRENATAL 1 + 1) 27-1 MG TABS tablet, Take 1 tablet by mouth daily before breakfast., Disp: 30 each, Rfl: 11 .  benzonatate (TESSALON) 200 MG capsule, Take 1 capsule (200 mg total) by mouth 2 (two) times daily as needed for cough., Disp: 20 capsule, Rfl: 0 .  desipramine (NORPRAMIN) 25 MG tablet, Take by mouth., Disp: , Rfl:   EXAM:  VITALS per patient if  applicable:  GENERAL: alert, oriented, appears well and in no acute distress  HEENT: atraumatic, conjunttiva clear, no obvious abnormalities on inspection of external nose and ears  NECK: normal movements of the head and neck  LUNGS: on inspection no signs of respiratory distress, breathing rate appears normal, no obvious gross SOB, gasping or wheezing  CV: no obvious cyanosis  MS: moves all visible extremities without noticeable abnormality  PSYCH/NEURO: pleasant and cooperative, no obvious depression or anxiety, speech and thought processing grossly intact  ASSESSMENT AND PLAN:  Discussed the following assessment and plan: More than 50% of over 25 minutes spent in total in caring for this patient was spent face-to-face with the patient, counseling and/or coordinating care.   COVID-19  Cough  -we discussed possible serious and likely etiologies, options for evaluation and workup, limitations of telemedicine visit vs in person visit, treatment, treatment risks and precautions. Pt prefers to treat via telemedicine empirically rather then risking or undertaking an in person visit at this moment. Discussed transmission, typical course, treatment, potential complications, prevention of CVOID19. Answered questions. She feels is improving overall, persistent cough is the main issues. Opted to try tessalon. Advised home isolation for 20 days from onset of symptoms given immunocompromised and advised to seek prompt care if worsening, new symptoms arise, or if is not improving with treatment.   Reviewed HM measures due and she agrees to set up once feeling better.  Needs  PCP. Sent message to schedulers to assist.  I discussed the assessment and treatment plan with the patient. The patient was provided an opportunity to ask questions and all were answered. The patient agreed with the plan and demonstrated an understanding of the instructions.   The patient was advised to call back or seek an  in-person evaluation if the symptoms worsen or if the condition fails to improve as anticipated.   Lucretia Kern, DO

## 2019-05-07 NOTE — Telephone Encounter (Signed)
Can you check on her and see if needs appt with me today? Also we need to get her set up to meet one of our docs so has an in house PCP. Thanks!Im happy to keep seeing her for virtual visits as long has has a [place] PCP. Thanks.

## 2019-05-14 ENCOUNTER — Other Ambulatory Visit: Payer: Self-pay

## 2019-05-14 DIAGNOSIS — Z20822 Contact with and (suspected) exposure to covid-19: Secondary | ICD-10-CM

## 2019-05-16 LAB — NOVEL CORONAVIRUS, NAA: SARS-CoV-2, NAA: NOT DETECTED

## 2019-05-22 NOTE — Unmapped (Signed)
[facility] Clinical Assessment & Refill Coordination Note    Stopped for 3 weeks due to covid. Will start again on Friday 12/4.  Had 3 pens of Humira but were left out of fridge for 1 day on 12/27.  Patient must use within 14 days after taking out of fridge, even if it was put back in the fridge.   She will use 1 pen for her next dose on 12/4 and discard the others     Jasmine Mitchell, DOB: Feb 10, 1971  Phone: There are no phone numbers on file.    All above HIPAA information was verified with patient.     Specialty Medication(s):   Inflammatory Disorders: Humira     Current Outpatient Medications   Medication Sig Dispense Refill   ??? acetaminophen (TYLENOL) 500 MG tablet Take 1,000 mg by mouth every four (4) hours as needed for pain.     ??? ADALIMUMAB PEN CITRATE FREE 40 MG/0.4 ML Inject the contents of 1 pen (40 mg total) under the skin once a week. 4 each 5   ??? cholestyramine-aspartame (CHOLESTYRAMINE LIGHT) 4 gram PwPk Take 1 packet by mouth Two (2) times a day. (Patient not taking: Reported on 12/08/2017) 180 packet 3   ??? ciprofloxacin HCl (CIPRO) 500 MG tablet TAKE 1 TABLET BY MOUTH 2 TIMES A DAY (Patient not taking: Reported on 07/25/2017) 60 tablet 2   ??? desipramine (NORPRAMIN) 25 MG tablet Take 3 tablets (75 mg total) by mouth daily. 90 tablet 6   ??? dicyclomine (BENTYL) 10 mg capsule TAKE 1 CAPSULE BY MOUTH 3 TIMES A DAY 90 capsule 3   ??? fluticasone (FLONASE) 50 mcg/actuation nasal spray 1 SPRAY BY EACH NARE ROUTE DAILY. AS DIRECTED (Patient taking differently: 1 spray by Each Nare route daily as needed. As directed) 16 mL 2   ??? loratadine (CLARITIN) 10 mg tablet Take 10 mg by mouth once as needed.      ??? MULTIVIT WITH CALCIUM,IRON,MIN (WOMEN'S DAILY MULTIVITAMIN ORAL) Take 1 tablet by mouth once daily.     ??? ondansetron (ZOFRAN-ODT) 8 MG disintegrating tablet Take 8 mg by mouth every eight (8) hours as needed. ??? oxyCODONE (ROXICODONE) 5 MG immediate release tablet Take 1 tablet (5 mg total) by mouth every six (6) hours as needed for pain. (Patient not taking: Reported on 01/20/2017) 10 tablet 0     No current facility-administered medications for this visit.         Changes to medications: [name] reports no changes at this time.    Allergies   Allergen Reactions   ??? Oxycodone (Bulk) Nausea And Vomiting     Patient tolerates acetaminophen well       Changes to allergies: No    SPECIALTY MEDICATION ADHERENCE     Humira 40mg /0.59ml  mg/ml: 21 days of medicine on hand        Medication Adherence    Patient reported X missed doses in the last month: 0  Specialty Medication: Humira 40mg /0.25ml  Patient is on additional specialty medications: No  Informant: patient          Specialty medication(s) dose(s) confirmed: Regimen is correct and unchanged.     Are there any concerns with adherence? No    Adherence counseling provided? Not needed    CLINICAL MANAGEMENT AND INTERVENTION      Clinical Benefit Assessment:    Do you feel the medicine is effective or helping your condition? Yes    Clinical  Benefit counseling provided? Not needed    Adverse Effects Assessment:    Are you experiencing any side effects? No    Are you experiencing difficulty administering your medicine? No    Quality of Life Assessment:    How many days over the past month did your Crohn's  keep you from your normal activities? For example, brushing your teeth or getting up in the morning. 0    Have you discussed this with your provider? Not needed    Therapy Appropriateness:    Is therapy appropriate? Yes, therapy is appropriate and should be continued    DISEASE/MEDICATION-SPECIFIC INFORMATION      For patients on injectable medications: Patient currently has 0 doses left.  Next injection is scheduled for 12/4.    PATIENT SPECIFIC NEEDS     ? Does the patient have any physical, cognitive, or cultural barriers? No    ? Is the patient high risk? No ? Does the patient require a Care Management Plan? No     ? Does the patient require physician intervention or other additional services (i.e. nutrition, smoking cessation, social work)? No      SHIPPING     Specialty Medication(s) to be Shipped:   Inflammatory Disorders: Humira    Other medication(s) to be shipped: n/a     Changes to insurance: No    Delivery Scheduled: Yes, Expected medication delivery date: 12/9.     Medication will be delivered via UPS to the confirmed prescription address in [facility].    The patient will receive a drug information handout for each medication shipped and additional FDA Medication Guides as required.  Verified that patient has previously received a Conservation officer, historic buildings.    All of the patient's questions and concerns have been addressed.    Julianne Rice   [facility] Specialty Pharmacist

## 2019-05-28 DIAGNOSIS — K5080 Crohn's disease of both small and large intestine without complications: Principal | ICD-10-CM

## 2019-05-28 MED FILL — HUMIRA PEN CITRATE FREE 40 MG/0.4 ML: SUBCUTANEOUS | 28 days supply | Qty: 4 | Fill #2

## 2019-05-28 MED FILL — HUMIRA PEN CITRATE FREE 40 MG/0.4 ML: 28 days supply | Qty: 4 | Fill #2 | Status: AC

## 2019-06-11 NOTE — Unmapped (Signed)
[facility] Refill Coordination Note    Specialty Medication(s) to be Shipped:   General Specialty: humira    Other medication(s) to be shipped:       Jasmine Mitchell, DOB: May 14, 1971  Phone: There are no phone numbers on file.      All above HIPAA information was verified with patient.     Was a Nurse, learning disability used for this call? No    Completed refill call assessment today to schedule patient's medication shipment from the [facility] [phone]).       Specialty medication(s) and dose(s) confirmed: Regimen is correct and unchanged.   Changes to medications: Clarivel reports no changes at this time.  Changes to insurance: No  Questions for the pharmacist: No    Confirmed patient received Welcome Packet with first shipment. The patient will receive a drug information handout for each medication shipped and additional FDA Medication Guides as required.       DISEASE/MEDICATION-SPECIFIC INFORMATION        For patients on injectable medications: Patient currently has 2 doses left.  Next injection is scheduled for 12/25,01/01.    SPECIALTY MEDICATION ADHERENCE     Medication Adherence    Patient reported X missed doses in the last month: 0  Specialty Medication: humira 40mg /0.2ml  Patient is on additional specialty medications: No  Informant: patient                humira 40/0.4   mg/ml: 14 days of medicine on hand         SHIPPING     Shipping address confirmed in Epic.     Delivery Scheduled: Yes, Expected medication delivery date: 01/05.     Medication will be delivered via UPS to the prescription address in Epic WAM.    [name]   [facility] Specialty Technician

## 2019-06-18 ENCOUNTER — Ambulatory Visit: Payer: BC Managed Care – PPO | Attending: Internal Medicine

## 2019-06-18 DIAGNOSIS — Z20828 Contact with and (suspected) exposure to other viral communicable diseases: Secondary | ICD-10-CM | POA: Insufficient documentation

## 2019-06-18 DIAGNOSIS — Z20822 Contact with and (suspected) exposure to covid-19: Secondary | ICD-10-CM

## 2019-06-19 LAB — NOVEL CORONAVIRUS, NAA: SARS-CoV-2, NAA: NOT DETECTED

## 2019-06-19 LAB — SPECIMEN STATUS REPORT

## 2019-06-24 MED FILL — HUMIRA PEN CITRATE FREE 40 MG/0.4 ML: 28 days supply | Qty: 4 | Fill #3 | Status: AC

## 2019-06-24 MED FILL — HUMIRA PEN CITRATE FREE 40 MG/0.4 ML: SUBCUTANEOUS | 28 days supply | Qty: 4 | Fill #3

## 2019-07-01 ENCOUNTER — Other Ambulatory Visit: Payer: Self-pay | Admitting: Obstetrics

## 2019-07-01 DIAGNOSIS — Z0000 Encounter for general adult medical examination without abnormal findings: Secondary | ICD-10-CM

## 2019-07-18 ENCOUNTER — Encounter: Payer: Self-pay | Admitting: Obstetrics

## 2019-07-18 ENCOUNTER — Other Ambulatory Visit: Payer: Self-pay

## 2019-07-18 ENCOUNTER — Ambulatory Visit (INDEPENDENT_AMBULATORY_CARE_PROVIDER_SITE_OTHER): Payer: BC Managed Care – PPO | Admitting: Obstetrics

## 2019-07-18 VITALS — BP 138/99 | HR 94 | Ht 64.0 in | Wt 244.1 lb

## 2019-07-18 DIAGNOSIS — N951 Menopausal and female climacteric states: Secondary | ICD-10-CM | POA: Diagnosis not present

## 2019-07-18 DIAGNOSIS — Z124 Encounter for screening for malignant neoplasm of cervix: Secondary | ICD-10-CM | POA: Diagnosis not present

## 2019-07-18 DIAGNOSIS — Z1239 Encounter for other screening for malignant neoplasm of breast: Secondary | ICD-10-CM

## 2019-07-18 DIAGNOSIS — N898 Other specified noninflammatory disorders of vagina: Secondary | ICD-10-CM

## 2019-07-18 DIAGNOSIS — Z6841 Body Mass Index (BMI) 40.0 and over, adult: Secondary | ICD-10-CM

## 2019-07-18 DIAGNOSIS — Z1151 Encounter for screening for human papillomavirus (HPV): Secondary | ICD-10-CM

## 2019-07-18 DIAGNOSIS — R030 Elevated blood-pressure reading, without diagnosis of hypertension: Secondary | ICD-10-CM

## 2019-07-18 DIAGNOSIS — Z113 Encounter for screening for infections with a predominantly sexual mode of transmission: Secondary | ICD-10-CM

## 2019-07-18 DIAGNOSIS — Z01419 Encounter for gynecological examination (general) (routine) without abnormal findings: Secondary | ICD-10-CM | POA: Diagnosis not present

## 2019-07-18 DIAGNOSIS — N390 Urinary tract infection, site not specified: Secondary | ICD-10-CM

## 2019-07-18 DIAGNOSIS — F329 Major depressive disorder, single episode, unspecified: Secondary | ICD-10-CM

## 2019-07-18 DIAGNOSIS — N3001 Acute cystitis with hematuria: Secondary | ICD-10-CM

## 2019-07-18 DIAGNOSIS — E66813 Obesity, class 3: Secondary | ICD-10-CM

## 2019-07-18 DIAGNOSIS — F32A Depression, unspecified: Secondary | ICD-10-CM

## 2019-07-18 DIAGNOSIS — F419 Anxiety disorder, unspecified: Secondary | ICD-10-CM

## 2019-07-18 DIAGNOSIS — Z0000 Encounter for general adult medical examination without abnormal findings: Secondary | ICD-10-CM

## 2019-07-18 LAB — POCT URINALYSIS DIPSTICK
Bilirubin, UA: NEGATIVE
Glucose, UA: NEGATIVE
Ketones, UA: NEGATIVE
Leukocytes, UA: NEGATIVE
Nitrite, UA: NEGATIVE
Protein, UA: NEGATIVE
Spec Grav, UA: 1.015 (ref 1.010–1.025)
Urobilinogen, UA: 0.2 E.U./dL
pH, UA: 7 (ref 5.0–8.0)

## 2019-07-18 MED ORDER — BUPROPION HCL ER (SR) 150 MG PO TB12: 150.0000 mg | ORAL_TABLET | Freq: Two times a day (BID) | ORAL | 11 refills | Status: DC

## 2019-07-18 MED ORDER — PRENATAL PLUS 27-1 MG PO TABS: 1.0000 | ORAL_TABLET | Freq: Every day | ORAL | 11 refills | Status: DC

## 2019-07-18 MED ORDER — SULFAMETHOXAZOLE-TRIMETHOPRIM 800-160 MG PO TABS: 1.0000 | ORAL_TABLET | Freq: Two times a day (BID) | ORAL | 0 refills | Status: DC

## 2019-07-18 NOTE — Progress Notes (Signed)
Pt presents for annual, pap, and all STD testing.  Pt c/o menopausal sx's mood swings, hot flashes, fatigue, anxiety, no motivation.  Pt frustrated with recurrent UTI's. Dr. Zigmund Daniel treated her several times without relief.  Normal mammogram 01/10/2018 Requests PNV rf

## 2019-07-18 NOTE — Progress Notes (Signed)
Subjective:        Katrina Grant is a 49 y.o. female here for a routine exam.  Current complaints: Pain and burning with urination.    Personal health questionnaire:  Is patient Ashkenazi Jewish, have a family history of breast and/or ovarian cancer: no Is there a family history of uterine cancer diagnosed at age < 45, gastrointestinal cancer, urinary tract cancer, family member who is a Field seismologist syndrome-associated carrier: no Is the patient overweight and hypertensive, family history of diabetes, personal history of gestational diabetes, preeclampsia or PCOS: yes Is patient over 68, have PCOS,  family history of premature CHD under age 7, diabetes, smoke, have hypertension or peripheral artery disease:  no At any time, has a partner hit, kicked or otherwise hurt or frightened you?: no Over the past 2 weeks, have you felt down, depressed or hopeless?: no Over the past 2 weeks, have you felt little interest or pleasure in doing things?:no   Gynecologic History No LMP recorded. (Menstrual status: Irregular Periods). Contraception: tubal ligation Last Pap: 12-13-2017. Results were: normal Last mammogram: 01-10-2018. Results were: normal  Obstetric History OB History  Gravida Para Term Preterm AB Living  2 2 2     2   SAB TAB Ectopic Multiple Live Births               # Outcome Date GA Lbr Len/2nd Weight Sex Delivery Anes PTL Lv  2 Term           1 Term             Past Medical History:  Diagnosis Date  . ADD (attention deficit disorder)   . Anxiety   . Cholesterol serum elevated    on medication in the past  . Chronic kidney disease   . Crohn's disease of colon with fistula ([place])    sees Dr. Coralyn Mark, [facility]  . Depression   . Fibromyalgia - managed by Dr. Dossie Der, [facility] - takes Neurontin and Tramadol 11/20/2013  . Frequent headaches   . GERD (gastroesophageal reflux disease)   . Osteoarthritis    knee, spine  . Pneumonia   . Syncope     Past Surgical History:   Procedure Laterality Date  . anal fistula repair      X 4   . colonoscopy with dilation     X 2   . FRACTURE SURGERY    . TUBAL LIGATION       Current Outpatient Medications:  .  Adalimumab (HUMIRA) 40 MG/0.8ML PSKT, Inject 40 mg into the skin once a week., Disp: , Rfl:  .  desipramine (NORPRAMIN) 25 MG tablet, Take by mouth 3 (three) times daily. , Disp: , Rfl:  .  ACCU-CHEK FASTCLIX LANCETS MISC, Use as directed to check blood sugar once day (Patient not taking: Reported on 07/18/2019), Disp: 102 each, Rfl: 0 .  acetaminophen (TYLENOL) 500 MG tablet, Take 1,000 mg by mouth every 6 (six) hours as needed. For migraines and stomach upset , Disp: , Rfl:  .  benzonatate (TESSALON) 200 MG capsule, Take 1 capsule (200 mg total) by mouth 2 (two) times daily as needed for cough. (Patient not taking: Reported on 07/18/2019), Disp: 20 capsule, Rfl: 0 .  dicyclomine (BENTYL) 10 MG capsule, Take 10 mg by mouth 3 (three) times daily. , Disp: , Rfl:  .  fluticasone (FLONASE) 50 MCG/ACT nasal spray, PLACE 1 SPRAY INTO BOTH NOSTRILS DAILY. (Patient not taking: Reported on 07/18/2019), Disp: 16 g,  Rfl: 1 .  glucose blood (ACCU-CHEK GUIDE) test strip, Use as instructed (Patient not taking: Reported on 07/18/2019), Disp: 100 each, Rfl: 1 .  nitrofurantoin, macrocrystal-monohydrate, (MACROBID) 100 MG capsule, Take 1 capsule (100 mg total) by mouth 2 (two) times daily. (Patient not taking: Reported on 07/18/2019), Disp: 14 capsule, Rfl: 0 .  ondansetron (ZOFRAN ODT) 8 MG disintegrating tablet, Take 1 tablet (8 mg total) by mouth every 8 (eight) hours as needed for nausea or vomiting. (Patient not taking: Reported on 07/18/2019), Disp: 30 tablet, Rfl: 0 .  prenatal vitamin w/FE, FA (PRENATAL 1 + 1) 27-1 MG TABS tablet, Take 1 tablet by mouth daily before breakfast. (Patient not taking: Reported on 07/18/2019), Disp: 30 each, Rfl: 11 .  sulfamethoxazole-trimethoprim (BACTRIM DS) 800-160 MG tablet, Take 1 tablet by mouth 2  (two) times daily., Disp: 14 tablet, Rfl: 0 Allergies  Allergen Reactions  . Percocet [Oxycodone-Acetaminophen] Nausea And Vomiting    Social History   Tobacco Use  . Smoking status: Never Smoker  . Smokeless tobacco: Never Used  Substance Use Topics  . Alcohol use: Not Currently    Alcohol/week: 0.0 standard drinks    Family History  Problem Relation Age of Onset  . Hypertension Mother   . ADD / ADHD Mother   . Anxiety disorder Mother   . Arthritis Mother   . Depression Mother   . Varicose Veins Mother   . Diabetes Father   . Hearing loss Father   . Heart disease Father   . Cancer Sister        cervical  . ADD / ADHD Sister   . Anxiety disorder Sister   . Arthritis Sister   . Depression Sister   . Early death Sister   . Varicose Veins Sister   . Mental illness Brother        drugs and alcohol abuse  . ADD / ADHD Brother   . Alcohol abuse Brother   . Asthma Brother   . Depression Brother   . Drug abuse Brother   . ADD / ADHD Daughter       Review of Systems  Constitutional: negative for fatigue and weight loss Respiratory: negative for cough and wheezing Cardiovascular: negative for chest pain, fatigue and palpitations Gastrointestinal: negative for abdominal pain and change in bowel habits Musculoskeletal:negative for myalgias Neurological: negative for gait problems and tremors Behavioral/Psych: positive for anxiety / depression Endocrine: negative for temperature intolerance    Genitourinary:negative for abnormal menstrual periods, genital lesions, hot flashes, sexual problems and vaginal discharge.  Positive for dysuria Integument/breast: negative for breast lump, breast tenderness, nipple discharge and skin lesion(s)    Objective:       BP (!) 138/99   Pulse 94   Ht 5' 4"  (1.626 m)   Wt 244 lb 1.6 oz (110.7 kg)   BMI 41.90 kg/m  General:   alert  Skin:   no rash or abnormalities  Lungs:   clear to auscultation bilaterally  Heart:   regular  rate and rhythm, S1, S2 normal, no murmur, click, rub or gallop  Breasts:   normal without suspicious masses, skin or nipple changes or axillary nodes  Abdomen:  normal findings: no organomegaly, soft, non-tender and no hernia  Pelvis:  External genitalia: normal general appearance Urinary system: urethral meatus normal and bladder without fullness, nontender Vaginal: normal without tenderness, induration or masses Cervix: normal appearance Adnexa: normal bimanual exam Uterus: anteverted and non-tender, normal size   Lab Review Urine  pregnancy test Labs reviewed yes Radiologic studies reviewed yes  Results for Katrina Grant, Katrina Grant (MRN [id number]) as of 07/18/2019 16:50  Ref. Range 07/18/2019 16:10  Bilirubin, UA Unknown neg  Clarity, UA Unknown cloudy  Color, UA Unknown yellow  Glucose Latest Ref Range: Negative  Negative  Ketones, UA Unknown neg  Leukocytes,UA Latest Ref Range: Negative  Negative  Nitrite, UA Unknown neg  pH, UA Latest Ref Range: 5.0 - 8.0  7.0  Protein,UA Latest Ref Range: Negative  Negative  Specific Gravity, UA Latest Ref Range: 1.010 - 1.025  1.015  Urobilinogen, UA Latest Ref Range: 0.2 or 1.0 E.U./dL 0.2  RBC, UA Unknown large   50% of 25 min visit spent on counseling and coordination of care.   Assessment:     1. Encounter for gynecological examination with Papanicolaou smear of cervix Rx: - Cytology - PAP - Hemoglobin A1c - TSH - CBC - Comprehensive metabolic panel  2. Perimenopause - clinically stable  3. Vaginal discharge Rx: - Cervicovaginal ancillary only( [place])  4. Screening examination for STD (sexually transmitted disease) Rx: - Hepatitis B surface antigen - Hepatitis C antibody - HIV Antibody (routine testing w rflx) - RPR  5. Chronic UTI - managed by UroGyn - Dr. Zigmund Daniel Rx: - Urine Culture - POCT Urinalysis Dipstick:  Large RBC's  6. Acute cystitis with hematuria Rx: - sulfamethoxazole-trimethoprim (BACTRIM DS)  800-160 MG tablet; Take 1 tablet by mouth 2 (two) times daily.  Dispense: 14 tablet; Refill: 0  7. Elevated BP without diagnosis of hypertension - followed by PCP  8. Anxiety and depression Rx: - buPROPion (WELLBUTRIN SR) 150 MG 12 hr tablet; Take 1 tablet (150 mg total) by mouth 2 (two) times daily.  Dispense: 60 tablet; Refill: 11  9. Class 3 severe obesity due to excess calories without serious comorbidity with body mass index (BMI) of 40.0 to 44.9 in adult [facility]) - program of caloric restriction, exercise and behavioral modification recommended  10. Screening breast examination Rx: - MM Digital Screening; Future  11. Routine adult health maintenance Rx: - prenatal vitamin w/FE, FA (PRENATAL 1 + 1) 27-1 MG TABS tablet; Take 1 tablet by mouth daily before breakfast.  Dispense: 30 tablet; Refill: 11   Plan:    Education reviewed: calcium supplements, depression evaluation, low fat, low cholesterol diet, safe sex/STD prevention, self breast exams and weight bearing exercise. Mammogram ordered. Follow up in: 1 year.   Meds ordered this encounter  Medications  . sulfamethoxazole-trimethoprim (BACTRIM DS) 800-160 MG tablet    Sig: Take 1 tablet by mouth 2 (two) times daily.    Dispense:  14 tablet    Refill:  0   Orders Placed This Encounter  Procedures  . Urine Culture  . MM Digital Screening    Standing Status:   Future    Standing Expiration Date:   09/14/2020    Order Specific Question:   Reason for Exam (SYMPTOM  OR DIAGNOSIS REQUIRED)    Answer:   Screening    Order Specific Question:   Is the patient pregnant?    Answer:   No    Order Specific Question:   Preferred imaging location?    Answer:   [facility]  . Hepatitis B surface antigen  . Hepatitis C antibody  . HIV Antibody (routine testing w rflx)  . RPR  . Hemoglobin A1c  . TSH  . CBC  . Comprehensive metabolic panel    Shelly Bombard, MD 07/18/2019 4:06  PM

## 2019-07-19 LAB — CERVICOVAGINAL ANCILLARY ONLY
Bacterial Vaginitis (gardnerella): NEGATIVE
Candida Glabrata: NEGATIVE
Candida Vaginitis: NEGATIVE
Chlamydia: NEGATIVE
Comment: NEGATIVE
Comment: NEGATIVE
Comment: NEGATIVE
Comment: NEGATIVE
Comment: NEGATIVE
Comment: NORMAL
Neisseria Gonorrhea: NEGATIVE
Trichomonas: NEGATIVE

## 2019-07-20 LAB — URINE CULTURE

## 2019-07-21 LAB — COMPREHENSIVE METABOLIC PANEL
ALT: 16 IU/L (ref 0–32)
AST: 17 IU/L (ref 0–40)
Albumin/Globulin Ratio: 1.3 (ref 1.2–2.2)
Albumin: 4.4 g/dL (ref 3.8–4.8)
Alkaline Phosphatase: 84 IU/L (ref 39–117)
BUN/Creatinine Ratio: 16 (ref 9–23)
BUN: 11 mg/dL (ref 6–24)
Bilirubin Total: 0.3 mg/dL (ref 0.0–1.2)
CO2: 24 mmol/L (ref 20–29)
Calcium: 9.6 mg/dL (ref 8.7–10.2)
Chloride: 102 mmol/L (ref 96–106)
Creatinine, Ser: 0.7 mg/dL (ref 0.57–1.00)
GFR calc Af Amer: 118 mL/min/{1.73_m2} (ref >59)
GFR calc non Af Amer: 102 mL/min/{1.73_m2} (ref >59)
Globulin, Total: 3.4 g/dL (ref 1.5–4.5)
Glucose: 88 mg/dL (ref 65–99)
Potassium: 4.2 mmol/L (ref 3.5–5.2)
Sodium: 139 mmol/L (ref 134–144)
Total Protein: 7.8 g/dL (ref 6.0–8.5)

## 2019-07-21 LAB — TSH: TSH: 3.77 u[IU]/mL (ref 0.450–4.500)

## 2019-07-21 LAB — HIV ANTIBODY (ROUTINE TESTING W REFLEX): HIV Screen 4th Generation wRfx: NONREACTIVE

## 2019-07-21 LAB — CBC
Hematocrit: 41.3 % (ref 34.0–46.6)
Hemoglobin: 13.5 g/dL (ref 11.1–15.9)
MCH: 29.2 pg (ref 26.6–33.0)
MCHC: 32.7 g/dL (ref 31.5–35.7)
MCV: 89 fL (ref 79–97)
Platelets: 226 10*3/uL (ref 150–450)
RBC: 4.63 x10E6/uL (ref 3.77–5.28)
RDW: 13.1 % (ref 11.7–15.4)
WBC: 7.7 10*3/uL (ref 3.4–10.8)

## 2019-07-21 LAB — HEMOGLOBIN A1C
Est. average glucose Bld gHb Est-mCnc: 117 mg/dL
Hgb A1c MFr Bld: 5.7 % — ABNORMAL HIGH (ref 4.8–5.6)

## 2019-07-21 LAB — RPR: RPR Ser Ql: NONREACTIVE

## 2019-07-21 LAB — HEPATITIS C ANTIBODY: Hep C Virus Ab: <0.1 s/co ratio (ref 0.0–0.9)

## 2019-07-21 LAB — HEPATITIS B SURFACE ANTIGEN: Hepatitis B Surface Ag: NEGATIVE

## 2019-07-22 ENCOUNTER — Other Ambulatory Visit: Payer: Self-pay | Admitting: Obstetrics

## 2019-07-22 ENCOUNTER — Telehealth: Payer: Self-pay

## 2019-07-22 DIAGNOSIS — N3001 Acute cystitis with hematuria: Secondary | ICD-10-CM

## 2019-07-22 LAB — CYTOLOGY - PAP
Comment: NEGATIVE
Diagnosis: NEGATIVE
High risk HPV: NEGATIVE

## 2019-07-22 NOTE — Telephone Encounter (Signed)
Advised pt of recommendation to decrease dosage and take it in the morning, pt voices understanding.

## 2019-07-22 NOTE — Telephone Encounter (Signed)
Pt called and reports that the Wellbutrin is causing her to have insomnia since she started taking it. Would like to know what our recommendations are.

## 2019-07-22 NOTE — Telephone Encounter (Signed)
Wellbutrin 150 mg SR.  Decrease dose from twice a day to once a day in the morning.

## 2019-07-23 NOTE — Unmapped (Signed)
[facility] Refill Coordination Note    Specialty Medication(s) to be Shipped:   Inflammatory Disorders: Humira 40mg /0.45ml     Jasmine Mitchell, DOB: [birth year]  Phone: There are no phone numbers on file.    All above HIPAA information was verified with patient.     Was a Nurse, learning disability used for this call? No    Completed refill call assessment today to schedule patient's medication shipment from the [facility] [phone]).       Specialty medication(s) and dose(s) confirmed: Regimen is correct and unchanged.   Changes to medications: Shalena reports starting the following medications: Bactrim ( 1 tab twice a day for 7 days for UTI) & Wellbutrin 150mg  ( 1 tab once daily)  Changes to insurance: No  Questions for the pharmacist: No    Confirmed patient received Welcome Packet with first shipment. The patient will receive a drug information handout for each medication shipped and additional FDA Medication Guides as required.       DISEASE/MEDICATION-SPECIFIC INFORMATION        For patients on injectable medications: Patient currently has 0 doses left.  Next injection is scheduled for 07/26/2019.    SPECIALTY MEDICATION ADHERENCE     Medication Adherence    Patient reported X missed doses in the last month: 0  Specialty Medication: Humira 40mg /0.30ml  Patient is on additional specialty medications: No  Informant: patient  Reliability of informant: reliable        SHIPPING     Shipping address confirmed in Epic.     Delivery Scheduled: Yes, Expected medication delivery date: 07/25/2019.     Medication will be delivered via UPS to the prescription address in Epic WAM.    Jasmine Mitchell Shared [facility] Specialty Technician

## 2019-07-24 MED FILL — HUMIRA PEN CITRATE FREE 40 MG/0.4 ML: SUBCUTANEOUS | 28 days supply | Qty: 4 | Fill #4

## 2019-07-24 MED FILL — HUMIRA PEN CITRATE FREE 40 MG/0.4 ML: 28 days supply | Qty: 4 | Fill #4 | Status: AC

## 2019-08-19 ENCOUNTER — Encounter: Payer: BC Managed Care – PPO | Admitting: Family Medicine

## 2019-08-20 NOTE — Unmapped (Signed)
[facility] Refill Coordination Note    Specialty Medication(s) to be Shipped:   Inflammatory Disorders: Humira 40MG /0.4ML     Jasmine Mitchell, DOB: [birth year]  Phone: There are no phone numbers on file.    All above HIPAA information was verified with patient.     Was a Nurse, learning disability used for this call? No    Completed refill call assessment today to schedule patient's medication shipment from the [facility] [phone]).       Specialty medication(s) and dose(s) confirmed: Regimen is correct and unchanged.   Changes to medications: Mosella reports no changes at this time.  Changes to insurance: No  Questions for the pharmacist: No    Confirmed patient received Welcome Packet with first shipment. The patient will receive a drug information handout for each medication shipped and additional FDA Medication Guides as required.       DISEASE/MEDICATION-SPECIFIC INFORMATION        For patients on injectable medications: Patient currently has 0 doses left.  Next injection is scheduled for 08/23/2019.    SPECIALTY MEDICATION ADHERENCE     Medication Adherence    Patient reported X missed doses in the last month: 0  Specialty Medication: Humira 40mg /0.72ml  Patient is on additional specialty medications: No  Informant: patient  Reliability of informant: reliable        Humira 40mg /0.39ml: 0 days of medicine on hand     SHIPPING     Shipping address confirmed in Epic.     Delivery Scheduled: Yes, Expected medication delivery date: 03/04/021.     Medication will be delivered via UPS to the prescription address in Epic WAM.    Kirk Basquez P Wetzel Bjornstad Shared [facility] Specialty Technician

## 2019-08-21 MED FILL — HUMIRA PEN CITRATE FREE 40 MG/0.4 ML: 28 days supply | Qty: 4 | Fill #5 | Status: AC

## 2019-08-21 MED FILL — HUMIRA PEN CITRATE FREE 40 MG/0.4 ML: SUBCUTANEOUS | 28 days supply | Qty: 4 | Fill #5

## 2019-08-22 ENCOUNTER — Other Ambulatory Visit: Payer: Self-pay
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
## 2019-08-23 ENCOUNTER — Ambulatory Visit: Payer: BC Managed Care – PPO | Admitting: Family Medicine

## 2019-08-23 ENCOUNTER — Encounter: Payer: Self-pay | Admitting: Family Medicine

## 2019-08-23 VITALS — BP 120/72 | HR 104 | Temp 98.0°F | Ht 64.0 in | Wt 246.5 lb

## 2019-08-23 DIAGNOSIS — K50919 Crohn's disease, unspecified, with unspecified complications: Secondary | ICD-10-CM | POA: Diagnosis not present

## 2019-08-23 DIAGNOSIS — E559 Vitamin D deficiency, unspecified: Secondary | ICD-10-CM | POA: Diagnosis not present

## 2019-08-23 DIAGNOSIS — R739 Hyperglycemia, unspecified: Secondary | ICD-10-CM

## 2019-08-23 DIAGNOSIS — R5383 Other fatigue: Secondary | ICD-10-CM | POA: Diagnosis not present

## 2019-08-23 DIAGNOSIS — Z13220 Encounter for screening for lipoid disorders: Secondary | ICD-10-CM

## 2019-08-23 NOTE — Patient Instructions (Signed)
Increase wellbutrin to 196m twice daily. Let me know if any problems with this.   labwork when able; I will call you with results. Please check your blood pressures daily or twice daily and record in the meanwhile so you can let me know how numbers are looking once we get your results.

## 2019-08-28 ENCOUNTER — Ambulatory Visit
Admission: RE | Admit: 2019-08-28 | Discharge: 2019-08-28 | Disposition: A | Discharge status: Home / Self Care | Payer: BC Managed Care – PPO | Source: Ambulatory Visit | Attending: Obstetrics | Admitting: Obstetrics

## 2019-08-28 ENCOUNTER — Other Ambulatory Visit: Payer: Self-pay

## 2019-08-28 DIAGNOSIS — Z1239 Encounter for other screening for malignant neoplasm of breast: Secondary | ICD-10-CM

## 2019-08-28 DIAGNOSIS — Z1231 Encounter for screening mammogram for malignant neoplasm of breast: Secondary | ICD-10-CM | POA: Diagnosis not present

## 2019-09-16 ENCOUNTER — Other Ambulatory Visit: Payer: Self-pay | Admitting: Family Medicine

## 2019-09-23 DIAGNOSIS — K5080 Crohn's disease of both small and large intestine without complications: Principal | ICD-10-CM

## 2019-09-23 NOTE — Unmapped (Signed)
[facility] Refill Coordination Note    Specialty Medication(s) to be Shipped:   Inflammatory Disorders: Humira 40mg /0.15ml     Jasmine Mitchell, DOB: 10/03/1970  Phone: There are no phone numbers on file.    All above HIPAA information was verified with patient.     Was a Nurse, learning disability used for this call? No    Completed refill call assessment today to schedule patient's medication shipment from the [facility] [phone]).       Specialty medication(s) and dose(s) confirmed: Regimen is correct and unchanged.   Changes to medications: [place] reports no changes at this time.  Changes to insurance: No  Questions for the pharmacist: No    Confirmed patient received Welcome Packet with first shipment. The patient will receive a drug information handout for each medication shipped and additional FDA Medication Guides as required.       DISEASE/MEDICATION-SPECIFIC INFORMATION        For patients on injectable medications: Patient currently has 1 doses left.  Next injection is scheduled for 09/27/2019.    SPECIALTY MEDICATION ADHERENCE     Medication Adherence    Patient reported X missed doses in the last month: 0  Specialty Medication: Humira 40mg /0.69ml  Patient is on additional specialty medications: No  Informant: patient  Reliability of informant: reliable        Humira 40mg /0.85ml : 4 days of medicine on hand     SHIPPING     Shipping address confirmed in Epic.     Delivery Scheduled: Yes, Expected medication delivery date: 09/26/2019.     Medication will be delivered via UPS to the prescription address in Epic WAM.    Charlayne Vultaggio P Wetzel Bjornstad Shared [facility] Specialty Technician

## 2019-09-24 MED ORDER — HUMIRA PEN CITRATE FREE 40 MG/0.4 ML
SUBCUTANEOUS | 1 refills | 28 days | Status: CP
Start: 2019-09-24 — End: 2020-09-23
  Filled 2019-09-25: qty 4, 28d supply, fill #0

## 2019-09-24 NOTE — Unmapped (Signed)
Due for app with Dr Raphael Gibney. msg sent to [facility] and to pt

## 2019-09-25 MED FILL — HUMIRA PEN CITRATE FREE 40 MG/0.4 ML: 28 days supply | Qty: 4 | Fill #0 | Status: AC

## 2019-10-04 DIAGNOSIS — Z20822 Contact with and (suspected) exposure to covid-19: Secondary | ICD-10-CM | POA: Diagnosis not present

## 2019-10-22 ENCOUNTER — Other Ambulatory Visit: Payer: Self-pay

## 2019-10-22 ENCOUNTER — Ambulatory Visit: Payer: BC Managed Care – PPO | Admitting: Obstetrics

## 2019-10-22 ENCOUNTER — Encounter: Payer: Self-pay | Admitting: Obstetrics

## 2019-10-22 VITALS — BP 118/84 | HR 89 | Wt 249.0 lb

## 2019-10-22 DIAGNOSIS — F418 Other specified anxiety disorders: Secondary | ICD-10-CM | POA: Diagnosis not present

## 2019-10-22 DIAGNOSIS — R399 Unspecified symptoms and signs involving the genitourinary system: Secondary | ICD-10-CM | POA: Diagnosis not present

## 2019-10-22 DIAGNOSIS — B3731 Acute candidiasis of vulva and vagina: Secondary | ICD-10-CM

## 2019-10-22 DIAGNOSIS — N390 Urinary tract infection, site not specified: Secondary | ICD-10-CM | POA: Diagnosis not present

## 2019-10-22 DIAGNOSIS — B373 Candidiasis of vulva and vagina: Secondary | ICD-10-CM | POA: Diagnosis not present

## 2019-10-22 LAB — POCT URINALYSIS DIPSTICK
Bilirubin, UA: NEGATIVE
Glucose, UA: NEGATIVE
Ketones, UA: NEGATIVE
Leukocytes, UA: NEGATIVE
Nitrite, UA: NEGATIVE
Protein, UA: NEGATIVE
Spec Grav, UA: 1.015 (ref 1.010–1.025)
Urobilinogen, UA: 0.2 E.U./dL
pH, UA: 6 (ref 5.0–8.0)

## 2019-10-22 MED ORDER — FLUCONAZOLE 200 MG PO TABS: 200.0000 mg | ORAL_TABLET | ORAL | 2 refills | Status: DC

## 2019-10-22 MED ORDER — BUPROPION HCL ER (XL) 300 MG PO TB24: 300.0000 mg | ORAL_TABLET | Freq: Every day | ORAL | 11 refills | Status: DC

## 2019-10-22 NOTE — Progress Notes (Addendum)
Patient ID: Katrina Grant, female   DOB: 08/04/1970, 49 y.o.   MRN: [id number]  Chief Complaint  Patient presents with  . Urinary Tract Infection    HPI Katrina Grant is a 49 y.o. female.  Chronic UTI's.  Recently treated in January 2021.  Has a history of frequent UTI's, and has been seen by Urology and UroGyn, and treated on several occasions successfully but the infection keeps coming back.  There is some correlation with UTI's post-coitally. HPI  Past Medical History:  Diagnosis Date  . ADD (attention deficit disorder)   . Anxiety   . Cholesterol serum elevated    on medication in the past  . Chronic kidney disease   . Crohn's disease of colon with fistula ([place])    sees Dr. Coralyn Mark, [facility]  . Depression   . Fibromyalgia - managed by Dr. Dossie Der, [facility] - takes Neurontin and Tramadol 11/20/2013  . Frequent headaches   . GERD (gastroesophageal reflux disease)   . Osteoarthritis    knee, spine  . Pneumonia   . Syncope     Past Surgical History:  Procedure Laterality Date  . anal fistula repair      X 4   . colonoscopy with dilation     X 2   . FRACTURE SURGERY    . TUBAL LIGATION      Family History  Problem Relation Age of Onset  . Hypertension Mother   . ADD / ADHD Mother   . Anxiety disorder Mother   . Arthritis Mother   . Depression Mother   . Varicose Veins Mother   . Diabetes Father   . Hearing loss Father   . Heart disease Father   . Cancer Sister        cervical  . ADD / ADHD Sister   . Anxiety disorder Sister   . Arthritis Sister   . Depression Sister   . Early death Sister   . Varicose Veins Sister   . Mental illness Brother        drugs and alcohol abuse  . ADD / ADHD Brother   . Alcohol abuse Brother   . Asthma Brother   . Depression Brother   . Drug abuse Brother   . ADD / ADHD Daughter     Social History Social History   Tobacco Use  . Smoking status: Never Smoker  . Smokeless tobacco: Never Used  Substance Use Topics  .  Alcohol use: Not Currently    Alcohol/week: 0.0 standard drinks  . Drug use: No    Allergies  Allergen Reactions  . Percocet [Oxycodone-Acetaminophen] Nausea And Vomiting    Current Outpatient Medications  Medication Sig Dispense Refill  . Adalimumab (HUMIRA) 40 MG/0.8ML PSKT Inject 40 mg into the skin once a week.    . fluticasone (FLONASE) 50 MCG/ACT nasal spray PLACE 1 SPRAY INTO BOTH NOSTRILS DAILY. PATIENT NEEDS AN APPOINTMENT 16 mL 0  . ACCU-CHEK FASTCLIX LANCETS MISC Use as directed to check blood sugar once day 102 each 0  . acetaminophen (TYLENOL) 500 MG tablet Take 1,000 mg by mouth every 6 (six) hours as needed. For migraines and stomach upset     . buPROPion (WELLBUTRIN XL) 300 MG 24 hr tablet Take 1 tablet (300 mg total) by mouth daily after breakfast. 30 tablet 11  . desipramine (NORPRAMIN) 25 MG tablet Take by mouth 3 (three) times daily.     Marland Kitchen dicyclomine (BENTYL) 10 MG capsule Take 10 mg  by mouth 3 (three) times daily.     . fluconazole (DIFLUCAN) 200 MG tablet Take 1 tablet (200 mg total) by mouth every 3 (three) days. 3 tablet 2  . glucose blood (ACCU-CHEK GUIDE) test strip Use as instructed 100 each 1  . ondansetron (ZOFRAN ODT) 8 MG disintegrating tablet Take 1 tablet (8 mg total) by mouth every 8 (eight) hours as needed for nausea or vomiting. 30 tablet 0  . prenatal vitamin w/FE, FA (PRENATAL 1 + 1) 27-1 MG TABS tablet Take 1 tablet by mouth daily before breakfast. 30 tablet 11   No current facility-administered medications for this visit.    Review of Systems Review of Systems Constitutional: negative for fatigue and weight loss Respiratory: negative for cough and wheezing Cardiovascular: negative for chest pain, fatigue and palpitations Gastrointestinal: negative for abdominal pain and change in bowel habits Genitourinary:positive for urinary frequency and burning Integument/breast: negative for nipple discharge Musculoskeletal:negative for  myalgias Neurological: negative for gait problems and tremors Behavioral/Psych: negative for abusive relationship, depression Endocrine: negative for temperature intolerance      Blood pressure 118/84, pulse 89, weight 249 lb (112.9 kg).  Physical Exam Physical Exam General:   alert and no distress  Skin:   no rash or abnormalities  Lungs:   good respiratory effort, clear  Heart:   regular rate and rhythm   The remainder of the physical exam was deferred due to the nature of the encounter being consultative.   >50% of 15 min visit spent on counseling and coordination of care.   Data Reviewed U/A, Urinalysis and C/S  Assessment     1. UTI symptoms Rx: - Urine Culture - POCT urinalysis dipstick  2. Chronic UTI (urinary tract infection) Rx: - Ambulatory referral to Urology  3. Anxiety associated with depression Rx: - buPROPion (WELLBUTRIN XL) 300 MG 24 hr tablet; Take 1 tablet (300 mg total) by mouth daily after breakfast.  Dispense: 30 tablet; Refill: 11  4. Candida vaginitis Rx: - fluconazole (DIFLUCAN) 200 MG tablet; Take 1 tablet (200 mg total) by mouth every 3 (three) days.  Dispense: 3 tablet; Refill: 2    Plan  Follow up in 3 months   Orders Placed This Encounter  Procedures  . Urine Culture  . Ambulatory referral to Urology    Referral Priority:   Routine    Referral Type:   Consultation    Referral Reason:   Specialty Services Required    Requested Specialty:   Urology    Number of Visits Requested:   1  . POCT urinalysis dipstick   Meds ordered this encounter  Medications  . buPROPion (WELLBUTRIN XL) 300 MG 24 hr tablet    Sig: Take 1 tablet (300 mg total) by mouth daily after breakfast.    Dispense:  30 tablet    Refill:  11  . fluconazole (DIFLUCAN) 200 MG tablet    Sig: Take 1 tablet (200 mg total) by mouth every 3 (three) days.    Dispense:  3 tablet    Refill:  2     Shelly Bombard, MD 10/22/2019 11:13 AM

## 2019-10-22 NOTE — Progress Notes (Signed)
Pt states she has another uti; Was recently treated.   Pt is also on Welbutrin- can only take once daily due to Crohns but pt feels that one daily is not helping.

## 2019-10-24 ENCOUNTER — Other Ambulatory Visit: Payer: Self-pay | Admitting: Obstetrics

## 2019-10-24 ENCOUNTER — Telehealth: Payer: Self-pay

## 2019-10-24 DIAGNOSIS — N390 Urinary tract infection, site not specified: Secondary | ICD-10-CM

## 2019-10-24 LAB — URINE CULTURE

## 2019-10-24 MED ORDER — SULFAMETHOXAZOLE 800 MG-TRIMETHOPRIM 160 MG TABLET
ORAL | 0 days
Start: 2019-10-24 — End: ?

## 2019-10-24 MED ORDER — SULFAMETHOXAZOLE-TRIMETHOPRIM 800-160 MG PO TABS: 1.0000 | ORAL_TABLET | Freq: Two times a day (BID) | ORAL | 1 refills | Status: DC

## 2019-10-24 NOTE — Telephone Encounter (Signed)
TC from pt regarding Rx for bactrim not being sent to pharmacy [facility] on [address]  Pt seen in office on 10/22/19 Pt recent UA results were neg

## 2019-10-24 NOTE — Telephone Encounter (Signed)
Bactrim DS Rx with instructions to take 1 tab po twice a day for 3 days prn, and also to take 1 tablet po after intercourse and empty bladder.

## 2019-10-25 NOTE — Unmapped (Signed)
[facility] Clinical Assessment & Refill Coordination Note    Jasmine Mitchell, [place]: February 07, 1971  Phone: There are no phone numbers on file.    All above HIPAA information was verified with patient.     Was a Nurse, learning disability used for this call? No    Specialty Medication(s):   Inflammatory Disorders: Humira     Current Outpatient Medications   Medication Sig Dispense Refill   ??? acetaminophen (TYLENOL) 500 MG tablet Take 1,000 mg by mouth every four (4) hours as needed for pain.     ??? buPROPion (WELLBUTRIN XL) 150 MG 24 hr tablet Take 150 mg by mouth daily.     ??? cholestyramine-aspartame (CHOLESTYRAMINE LIGHT) 4 gram PwPk Take 1 packet by mouth Two (2) times a day. (Patient not taking: Reported on 12/08/2017) 180 packet 3   ??? ciprofloxacin HCl (CIPRO) 500 MG tablet TAKE 1 TABLET BY MOUTH 2 TIMES A DAY (Patient not taking: Reported on 07/25/2017) 60 tablet 2   ??? dicyclomine (BENTYL) 10 mg capsule TAKE 1 CAPSULE BY MOUTH 3 TIMES A DAY 90 capsule 3   ??? fluticasone (FLONASE) 50 mcg/actuation nasal spray 1 SPRAY BY EACH NARE ROUTE DAILY. AS DIRECTED (Patient taking differently: 1 spray by Each Nare route daily as needed. As directed) 16 mL 2   ??? HUMIRA PEN CITRATE FREE 40 MG/0.4 ML Inject the contents of 1 pen (40 mg total) under the skin once a week. 4 each 1   ??? loratadine (CLARITIN) 10 mg tablet Take 10 mg by mouth once as needed.      ??? MULTIVIT WITH CALCIUM,IRON,MIN (WOMEN'S DAILY MULTIVITAMIN ORAL) Take 1 tablet by mouth once daily.     ??? ondansetron (ZOFRAN-ODT) 8 MG disintegrating tablet Take 8 mg by mouth every eight (8) hours as needed.      ??? oxyCODONE (ROXICODONE) 5 MG immediate release tablet Take 1 tablet (5 mg total) by mouth every six (6) hours as needed for pain. (Patient not taking: Reported on 01/20/2017) 10 tablet 0     No current facility-administered medications for this visit.        Changes to medications: Sohana reports no changes at this time.    Allergies   Allergen Reactions   ??? Oxycodone (Bulk) Nausea And Vomiting     Patient tolerates acetaminophen well       Changes to allergies: No    SPECIALTY MEDICATION ADHERENCE         Medication Adherence    Patient reported X missed doses in the last month: 0  Specialty Medication: Humira 40 mg/0.4 ml  Patient is on additional specialty medications: No  Informant: patient  Reasons for non-adherence: no problems identified  Confirmed plan for next specialty medication refill: delivery by pharmacy  Refills needed for supportive medications: not needed          Specialty medication(s) dose(s) confirmed: Regimen is correct and unchanged.     Are there any concerns with adherence? No    Adherence counseling provided? Not needed    CLINICAL MANAGEMENT AND INTERVENTION      Clinical Benefit Assessment:    Do you feel the medicine is effective or helping your condition? Yes    Clinical Benefit counseling provided? Not needed    Adverse Effects Assessment:    Are you experiencing any side effects? No    Are you experiencing difficulty administering your medicine? No    Quality of Life Assessment:    How  many days over the past month did your Crohn's  keep you from your normal activities? For example, brushing your teeth or getting up in the morning. 0    Have you discussed this with your provider? Not needed    Therapy Appropriateness:    Is therapy appropriate? Yes, therapy is appropriate and should be continued    DISEASE/MEDICATION-SPECIFIC INFORMATION      For patients on injectable medications: Patient currently has 0 doses left.  Next injection is scheduled for 5/14.    PATIENT SPECIFIC NEEDS     - Does the patient have any physical, cognitive, or cultural barriers? No    - Is the patient high risk? No     - Does the patient require a Care Management Plan? No     - Does the patient require physician intervention or other additional services (i.e. nutrition, smoking cessation, social work)? No      SHIPPING     Specialty Medication(s) to be Shipped:   Inflammatory Disorders: Humira    Other medication(s) to be shipped: n/a     Changes to insurance: No    Delivery Scheduled: Yes, Expected medication delivery date: 5/12.     Medication will be delivered via UPS to the confirmed prescription address in [facility].    The patient will receive a drug information handout for each medication shipped and additional FDA Medication Guides as required.  Verified that patient has previously received a Conservation officer, historic buildings.    All of the patient's questions and concerns have been addressed.    Julianne Rice   [facility] Specialty Pharmacist

## 2019-10-29 MED FILL — HUMIRA PEN CITRATE FREE 40 MG/0.4 ML: 28 days supply | Qty: 4 | Fill #1 | Status: AC

## 2019-10-29 MED FILL — HUMIRA PEN CITRATE FREE 40 MG/0.4 ML: SUBCUTANEOUS | 28 days supply | Qty: 4 | Fill #1

## 2019-11-14 DIAGNOSIS — K5080 Crohn's disease of both small and large intestine without complications: Principal | ICD-10-CM

## 2019-11-14 MED ORDER — HUMIRA PEN CITRATE FREE 40 MG/0.4 ML
SUBCUTANEOUS | 1 refills | 28 days
Start: 2019-11-14 — End: 2020-11-13

## 2019-11-14 NOTE — Unmapped (Signed)
[facility] Refill Coordination Note    Specialty Medication(s) to be Shipped:   Inflammatory Disorders: Humira 40mg /0.48ml     Jasmine Mitchell, DOB: [birth year]  Phone: [phone] (home)     All above HIPAA information was verified with patient.     Was a Nurse, learning disability used for this call? No    Completed refill call assessment today to schedule patient's medication shipment from the [facility] [phone]).       Specialty medication(s) and dose(s) confirmed: Regimen is correct and unchanged.   Changes to medications: Jasmine Mitchell reports starting the following medications: Wellbutrin XL 150mg  ( 1 Tab daily )  Changes to insurance: No  Questions for the pharmacist: No    Confirmed patient received Welcome Packet with first shipment. The patient will receive a drug information handout for each medication shipped and additional FDA Medication Guides as required.       DISEASE/MEDICATION-SPECIFIC INFORMATION        For patients on injectable medications: Patient currently has 1 doses left.  Next injection is scheduled for 11/15/2019.    SPECIALTY MEDICATION ADHERENCE     Medication Adherence    Patient reported X missed doses in the last month: 0  Specialty Medication: Humira 40mg /0.74ml  Patient is on additional specialty medications: No  Informant: patient  Reliability of informant: reliable        Humira 40mg /0.55ml : 0 days of medicine on hand     SHIPPING     Shipping address confirmed in Epic.     Delivery Scheduled: Yes, Expected medication delivery date: 11/21/2019.     Medication will be delivered via UPS to the prescription address in Epic [facility].    Jasmine Mitchell Shared [facility] Specialty Technician

## 2019-11-15 MED ORDER — HUMIRA PEN CITRATE FREE 40 MG/0.4 ML
SUBCUTANEOUS | 1 refills | 28.00000 days | Status: CP
Start: 2019-11-15 — End: 2020-11-14
  Filled 2019-11-20: qty 4, 28d supply, fill #0

## 2019-11-20 ENCOUNTER — Other Ambulatory Visit: Payer: Self-pay | Admitting: Family Medicine

## 2019-11-20 MED FILL — HUMIRA PEN CITRATE FREE 40 MG/0.4 ML: 28 days supply | Qty: 4 | Fill #0 | Status: AC

## 2019-12-12 NOTE — Unmapped (Signed)
[facility] Refill Coordination Note    Specialty Medication(s) to be Shipped:   Inflammatory Disorders: Humira 40mg /0.35ml     Jasmine Mitchell, DOB: Oct 14, 1970  Phone: [phone] (home)     All above HIPAA information was verified with patient.     Was a Nurse, learning disability used for this call? No    Completed refill call assessment today to schedule patient's medication shipment from the [facility] [phone]).       Specialty medication(s) and dose(s) confirmed: Regimen is correct and unchanged.   Changes to medications: Orine reports no changes at this time.  Changes to insurance: No  Questions for the pharmacist: No    Confirmed patient received Welcome Packet with first shipment. The patient will receive a drug information handout for each medication shipped and additional FDA Medication Guides as required.       DISEASE/MEDICATION-SPECIFIC INFORMATION        For patients on injectable medications: Patient currently has 1 doses left.  Next injection is scheduled for 12/13/2019.    SPECIALTY MEDICATION ADHERENCE     Medication Adherence    Patient reported X missed doses in the last month: 0  Specialty Medication: Humira 40mg /0.16ml  Patient is on additional specialty medications: No  Informant: patient  Reliability of informant: reliable        Humira 40mg /0.38ml : 0 days of medicine on hand     SHIPPING     Shipping address confirmed in Epic.     Delivery Scheduled: Yes, Expected medication delivery date: 12/18/2019.     Medication will be delivered via UPS to the prescription address in Epic [facility].    Kameela Leipold P [facility] [place] [facility] Specialty Technician

## 2019-12-14 NOTE — Unmapped (Signed)
[facility]   FOLLOWUP NOTE - INFLAMMATORY BOWEL DISEASE  12/17/2019    Demographics:  Jasmine Mitchell is a 49 y.o. year old female    Diagnosis:  Crohn's Disease  Disease onset (yr): 32  Age at onset:   < 30 yr old (A1)  Location:  Ileocolonic (L3) (mostly colonic)  Behavior:  Perianal (P), Stricturing (B2)  Current Tight Control Scenario:   Maintenance = Biologic          HPI / NOTE :     Interval Events:   1.  Last seen by me 08/2018.  Remains on weekly Humira and ?desipramine.    2.  She had (+)COVID in 04/2019.   3.  I reviewed colonoscopy 01/2019 - mild anal stricture (dilated), but otherwise normal colon with no active Crohn's disease.   4.  No recent labs  5.  Has had a lot of UTI's over the past year, has been on several different antibiotics recently. Has a new urologist appointment 7/29.     HPI:  Past 3-4 months, lot of acid reflux / heartburn symptoms.  She actually describes this as mostly epigastric burning.  Has noticed some triggers, like coffee, greasy foods. Also burping and belching.  This happens at least 2-3x/week. Has mainly used tums and rolaids, which don't help much.      Remains on Humira weekly.  Stools are still loose.  Feels this is related to IBS.  Hasn't been using Desipramine recently.      Has been on Wellbutrin XL 150mg  for anxiety; was working initially but feels like it isn't working as well now.     Abdominal pain (0-10): mild  BM a day: ~4x/day  Consistency: soft  % of stools have blood: 0%  Nocturnal BM: rare  Urgency:  mild  Weight change over last 6 mo: stable  Smoking:  no  NSAIDS: avoids    Review of Systems:   Review of systems positive for: negative except as above.   Otherwise, the balance of 10 systems is negative.          IBD HISTORY:     Brief IBD Disease Course:    - 32 - dx with Crohn's dz ~age 24, sx of diarrhea, abd pain, rectal pain and bloody stools.  Treated with sulfasalazine, Asacol, Pentasa without benefit.  Got courses of steroids and antibiotics on and off.  Tried on but had nausea.   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    - 2004 - Remicade x 9 months. Stopped in 2005 due to 2ndary loss of response.   - 2005 - developed a pyoderma lesion.   - 2010 - Re-established care with Dr. Marcella Dubs.  Concern for internal fistulizing disease.  Was on Cimzia. Had surgery for perianal disease.   - 2011 - Started on Humira.   - 2012 - active perianal disease with a large abscess.  Required EUA, drainage, setons ([place]).   - 2013 - April, dx with pelvic floor dyssynergia and anal sphincter weakness, underwent biofeedback x 2 courses ([facility] and [place]).   - 2014 - on - doing well on Humira and long term Cipro.  Having on and off bowel symptoms symptoms which have been thought to be related to intermittent IBS symptoms.     Endoscopy:      - Colonoscopy 06/17/2003: patch inflammation and ulcers throughout the colon.    - Colonoscopy 01/2009:  Active crohn's at IC valve. Pseudopolyps.  Retraction  of IC valve. ?Rectosigmoid fistula tract.  Otherwise normal colon.   - Colonoscopy 10/08/2009:  Perianal fistula, pseudopolyps.  Few aphthae in TI.   - Colonoscopy 07/29/2010:  Poor colon prep, pseudopolyps, stricture at IC valve.  Few ulcers at IC valve. Rectal stricture. Ileal crohn's disease.   - Colonoscopy 05/05/2011:  Pseduopolyps throughout.  4mm cecal polyp. Large fistula in sigmoid colon. Normal TI.   - Colonoscopy 09/06/2012:  Healed scars from prior fistulotomies on perianal exam. Scar in ascending colon. No active Crohn's.   - Colonoscopy 10/24/13:  Pseudopolyps in rectosigmoid, otherwise normal colon and TI.   - Colonoscopy 10/01/14:  Pseudopolyps in rectum, transverse and ascending colon.  Otherwise normal colon.  Granular TI.  Fixed and open IC valve (not stenotic).   - Colonoscopy 08/12/16:  Mild anal canal stenosis on exam, otherwise normal colon and ileum.     PATH = chronic quiescent colitis (left and right colon) no dysplasia  - Colonoscopy 01/28/2019 - mild anal stricture (dilated to 20mm via [place]), otherwise normal colon and ileum with no active inflammation    Imaging:    - CT A/P 10/24/2013: mild colonic wall thickening at splenic/descending colon, no other bowel inflammation or stranding.  *Pachy groundglass opacity in left lung base.     Prior IBD medications (type, dose, duration, response):  x 5-ASAs - Sulfasalazine, Asacol, Pentasa  x Oral corticosteroids - Prednisone  ? Intravenous corticosteroids  x Antibiotics - Flagyl - nausea.  Cipro - tolerates well and it helps.   x Thiopurines - - nausea.    TPMT - normal 07/25/17  x Methotrexate - 2005.   x Anti-TNF therapies - Remicade 2004 - 2005 (~6 doses).  Had gradual loss of response so stopped. Tried on Remicade again in 2009 with some effect, increased to 10mg /kg q4 weeks. Cimzia - started 09/2008.  Humira 2014 - dose increased to 80mg  q2 weeks.  04/2016 - humira level 5.6, Ab negative.   ? Cyclosporine  ? Clinical trial medication  ? Other (Please specify):    Extraintestinal manifestations:   -joint pains affecting: n  -eye: n  -skin: n  -oral ulcers :  n  -blood clots: n  -PSC: n  -other: n          Past Medical History:   Past medical history:   Past Medical History:   Diagnosis Date   ??? Acid reflux    ??? Arthritis    ??? Crohn disease (CMS-HCC)    ??? Fibromyalgia    ??? UTI (urinary tract infection)      Past surgical history:   Past Surgical History:   Procedure Laterality Date   ??? FRACTURE SURGERY      left wrist   ??? GASTROCUTANEOUS FISTULA CLOSURE     ??? ORTHOPEDIC SURGERY     ??? PR COLONOSCOPY FLX DX W/COLLJ SPEC WHEN PFRMD N/A 10/24/2013    Procedure: COLONOSCOPY, FLEXIBLE, PROXIMAL TO SPLENIC FLEXURE; DIAGNOSTIC, W/WO COLLECTION SPECIMEN BY BRUSH OR WASH;  Surgeon: Theadore Nan, MD;  [facility];  Service: Gastroenterology   ??? PR COLONOSCOPY W/BIOPSY SINGLE/MULTIPLE N/A 10/01/2014    Procedure: COLONOSCOPY, FLEXIBLE, PROXIMAL TO SPLENIC FLEXURE; WITH BIOPSY, SINGLE OR MULTIPLE;  Surgeon: Teodoro Spray, MD;  Location: [facility] PROCEDURES [facility];  Service: Gastroenterology   ??? PR COLONOSCOPY W/BIOPSY SINGLE/MULTIPLE N/A 08/12/2016    Procedure: COLONOSCOPY, FLEXIBLE, PROXIMAL TO SPLENIC FLEXURE; WITH BIOPSY, SINGLE OR MULTIPLE;  Surgeon: Zetta Bills, MD;  Location:  GI PROCEDURES [facility];  Service: Gastroenterology   ??? PR COLONOSCOPY W/BIOPSY SINGLE/MULTIPLE Left 01/28/2019    Procedure: COLONOSCOPY, FLEXIBLE, PROXIMAL TO SPLENIC FLEXURE; WITH BIOPSY, SINGLE OR MULTIPLE;  Surgeon: Zetta Bills, MD;  Location: [facility] PROCEDURES [facility];  Service: Gastroenterology   ??? PR COLSC FLX W/RMVL OF TUMOR POLYP LESION SNARE TQ Left 01/28/2019    Procedure: COLONOSCOPY FLEX; W/REMOV TUMOR/LES BY SNARE;  Surgeon: Zetta Bills, MD;  Location: [facility] PROCEDURES [facility];  Service: Gastroenterology   ??? PR SURG DIAGNOSTIC EXAM, ANORECTAL N/A 12/23/2016    Procedure: ANORECTAL EXAM, SURGICAL, REQUIRING ANESTHESIA (GENERAL, SPINAL, OR EPIDURAL), DIAGNOSTIC;  Surgeon: Mickle Asper, MD;  Location: MAIN OR [place];  Service: Gastrointestinal   ??? PR UPPER GI ENDOSCOPY,DIAGNOSIS N/A 08/12/2016    Procedure: UGI ENDO, INCLUDE ESOPHAGUS, STOMACH, & DUODENUM &/OR JEJUNUM; DX W/WO COLLECTION SPECIMN, BY BRUSH OR WASH;  Surgeon: Zetta Bills, MD;  Location: [facility] PROCEDURES [facility];  Service: Gastroenterology   ??? TUBAL LIGATION       Family history:   Family History   Problem Relation Age of Onset   ??? Cancer Sister    ??? Cancer Paternal Grandmother    ??? Colorectal Cancer Neg Hx      Social history:   Social History     Socioeconomic History   ??? Marital status: Single     Spouse name: None   ??? Number of children: None   ??? Years of education: None   ??? Highest education level: None   Occupational History   ??? None   Tobacco Use   ??? Smoking status: Never Smoker   ??? Smokeless tobacco: Never Used   Substance and Sexual Activity   ??? Alcohol use: Yes     Alcohol/week: 0.0 standard drinks Comment: socially    ??? Drug use: No   ??? Sexual activity: None   Other Topics Concern   ??? None   Social History Narrative   ??? None     Social Determinants of Health     Financial Resource Strain:    ??? Difficulty of Paying Living Expenses:    Food Insecurity:    ??? Worried About Programme researcher, broadcasting/film/video in the Last Year:    ??? Barista in the Last Year:    Transportation Needs:    ??? Freight forwarder (Medical):    ??? Lack of Transportation (Non-Medical):    Physical Activity:    ??? Days of Exercise per Week:    ??? Minutes of Exercise per Session:    Stress:    ??? Feeling of Stress :    Social Connections:    ??? Frequency of Communication with Friends and Family:    ??? Frequency of Social Gatherings with Friends and Family:    ??? Attends Religious Services:    ??? Database administrator or Organizations:    ??? Attends Banker Meetings:    ??? Marital Status:              Allergies:     Allergies   Allergen Reactions   ??? Oxycodone (Bulk) Nausea And Vomiting     Patient tolerates acetaminophen well             Medications:     Current Outpatient Medications   Medication Sig Dispense Refill   ??? acetaminophen (TYLENOL) 500 MG tablet Take 1,000 mg by mouth every four (4) hours as needed for pain.     ???  buPROPion (WELLBUTRIN XL) 150 MG 24 hr tablet Take 150 mg by mouth daily.     ??? fluticasone (FLONASE) 50 mcg/actuation nasal spray 1 SPRAY BY EACH NARE ROUTE DAILY. AS DIRECTED (Patient taking differently: 1 spray by Each Nare route daily as needed. As directed) 16 mL 2   ??? HUMIRA PEN CITRATE FREE 40 MG/0.4 ML Inject the contents of 1 pen (40 mg total) under the skin once a week. 4 each 1   ??? loratadine (CLARITIN) 10 mg tablet Take 10 mg by mouth once as needed.      ??? MULTIVIT WITH CALCIUM,IRON,MIN (WOMEN'S DAILY MULTIVITAMIN ORAL) Take 1 tablet by mouth once daily.     ??? ondansetron (ZOFRAN-ODT) 8 MG disintegrating tablet Take 8 mg by mouth every eight (8) hours as needed.      ??? sulfamethoxazole-trimethoprim (BACTRIM DS) 800-160 mg per tablet Take 1 tablet by mouth.     ??? desipramine (NORPRAMIN) 25 MG tablet Take 3 tablets (75 mg total) by mouth nightly. 270 tablet 1   ??? dicyclomine (BENTYL) 10 mg capsule TAKE 1 CAPSULE BY MOUTH 3 TIMES A DAY (Patient not taking: Reported on 10/28/2019) 90 capsule 3   ??? esomeprazole (NEXIUM) 20 MG capsule Take 1 capsule (20 mg total) by mouth every morning before breakfast. 90 capsule 3     No current facility-administered medications for this visit.             Physical Exam:   BP 121/89  - Pulse 86  - Temp 36.8 ??C (98.3 ??F)  - Wt (!) 111.6 kg (246 lb)  - BMI 40.94 kg/m??     GEN:  Obese, well appearing female in no apparent distress, appears comfortable on exam  HEENT: PEERL, OP clear with no erythema, lesions, exudate, mucous membranes moist  NECK: Supple, no lymphadenopathy  PULM: CTAB, no wheezes, rales, or rhonchi  CV: S1/S2, RRR, no murmurs  GI: Soft, non-tender, nondistended, normoactive bowel sounds, no rebound/guarding, no appreciable organomegaly  Extremities: no cyanosis, clubbing or edema, normal gait  Psych: affect appropriate, A&O x3  Skin:  No skin rashes or lesions          Labs, Data & Indices:     Lab Review:   Lab Results   Component Value Date    WBC 5.6 12/17/2019    WBC 6.5 04/29/2016    RBC 4.42 12/17/2019    RBC 4.50 04/29/2016    HGB 13.0 12/17/2019    HGB 13.2 04/29/2016     Lab Results   Component Value Date    AST 25 08/21/2018    AST 13 04/29/2016    ALT 18 08/21/2018    ALT 9 04/29/2016    BUN 13 08/21/2018    BUN 11 04/29/2016    Creatinine 0.85 08/21/2018    Creatinine 0.80 04/29/2016    CO2 30.0 08/21/2018    CO2 21 04/29/2016    Albumin 4.2 08/21/2018    Albumin 4.3 08/19/2014    Calcium 10.0 08/21/2018    Calcium 9.4 04/29/2016     Lab Results   Component Value Date    TSH 2.810 04/29/2016      ...........................................................................................................................................Marland Kitchen   Diagnosis ICD-10-CM Associated Orders   1. Crohn's disease of both small and large intestine without complication (CMS-HCC)  K50.80 CBC     Comprehensive Metabolic Panel     C-reactive protein     Sedimentation Rate     Ferritin     Vitamin B12  Level     Vitamin D 25 Hydroxy (25OH D2 + D3)     Iron & TIBC     Iron & TIBC     Vitamin D 25 Hydroxy (25OH D2 + D3)     Vitamin B12 Level     Ferritin     Sedimentation Rate     C-reactive protein     Comprehensive Metabolic Panel     CBC   2. Irritable bowel syndrome with diarrhea  K58.0 desipramine (NORPRAMIN) 25 MG tablet   3. Dyspepsia  R10.13 esomeprazole (NEXIUM) 20 MG capsule   4. Heartburn  R12 esomeprazole (NEXIUM) 20 MG capsule           Assessment & Recommendations:   Disease state:  Ileocolonic Crohn's with perianal disease and anorectal stricture    Jasmine Mitchell is a 49 y.o. female with a long standing hx of stricturing ileocolonic Crohn's disease.  She also has hx of perianal disease last requiring drainage by Dr. Elenore Rota (summer 2018).  She is currently maintained on weekly Humira monotherapy in clinical and endoscopic remission on this therapy. She has Irritable bowel with diarrhea and pain, which has responded well to desipramine in the past.  She does have ongoing issues with stress and anxiety, which has been partially relieved by Wellbutrin. We will restart Desipramine to help with IBS-D, and this may also have some collateral benefits for her anxiety and depression.      Ms. Gadea also reports upper abdominal symptoms, which sound mostly like dyspepsia.  We will try low dose nexium (20mg  daily) to see if this helps, and I also suggested some dietary modifications for her as well.      PLAN:  1.  Continue Humira weekly injections  2.  Re-start Desipramine. Start at 50mg  daily.  You can increase to 75mg  daily after 1 week.   3.  Try Nexium 20mg  every day.  Take it first thing in the morning to help with burning in your stomach.   4.  Try to keep diary of other foods that might trigger your stomach discomfort/burning.  Try to avoid coffee on an pty stomach.   5.  Update lab work today.    IBD health maintenance:  Influenza vaccine: 2012, 117/17  Pneumonia vaccine: Prevnar 02/2014; Pneumovax 3//08/2018  COVID Vaccine:  Pfizer April 2021  Hepatitis B:   TB testing:   Chickenpox/Shingles history:   Bone denistometry:   Derm appointment:  Last small bowel imaging:   Last colonoscopy: 01/2019  PAP smear:   --------------------------------------------  I personally spent 46 minutes face-to-face and non-face-to-face in the care of this patient, which includes all pre, intra, and post visit time on the date of service.    Author: Zetta Bills 12/17/2019 9:50 AM    Zetta Bills, MD  Assistant Professor of Medicine  Division of Gastroenterology & Hepatology  [facility] [place]

## 2019-12-17 ENCOUNTER — Encounter: Admit: 2019-12-17 | Discharge: 2019-12-18 | Payer: PRIVATE HEALTH INSURANCE

## 2019-12-17 DIAGNOSIS — K5080 Crohn's disease of both small and large intestine without complications: Secondary | ICD-10-CM | POA: Diagnosis not present

## 2019-12-17 DIAGNOSIS — R1013 Epigastric pain: Secondary | ICD-10-CM | POA: Diagnosis not present

## 2019-12-17 DIAGNOSIS — R12 Heartburn: Secondary | ICD-10-CM | POA: Diagnosis not present

## 2019-12-17 DIAGNOSIS — K580 Irritable bowel syndrome with diarrhea: Secondary | ICD-10-CM | POA: Diagnosis not present

## 2019-12-17 DIAGNOSIS — Z6841 Body Mass Index (BMI) 40.0 and over, adult: Secondary | ICD-10-CM | POA: Diagnosis not present

## 2019-12-17 LAB — CBC
HEMOGLOBIN: 13 g/dL (ref 12.0–15.5)
MEAN CORPUSCULAR HEMOGLOBIN CONC: 32.9 g/dL (ref 30.0–36.0)
MEAN CORPUSCULAR HEMOGLOBIN: 29.5 pg (ref 26.0–34.0)
MEAN CORPUSCULAR VOLUME: 89.4 fL (ref 82.0–98.0)
PLATELET COUNT: 244 10*9/L (ref 150–450)
RED BLOOD CELL COUNT: 4.42 10*12/L (ref 3.90–5.03)
RED CELL DISTRIBUTION WIDTH: 15.1 % — ABNORMAL HIGH (ref 12.0–15.0)
WBC ADJUSTED: 5.6 10*9/L (ref 3.5–10.5)

## 2019-12-17 LAB — VITAMIN B12: VITAMIN B-12: 377 pg/mL (ref 193–900)

## 2019-12-17 LAB — COMPREHENSIVE METABOLIC PANEL
ALBUMIN: 3.6 g/dL (ref 3.4–5.0)
ALT (SGPT): 19 U/L (ref 10–49)
ANION GAP: 5 mmol/L (ref 3–11)
AST (SGOT): 18 U/L (ref <34)
BILIRUBIN TOTAL: 0.4 mg/dL (ref 0.3–1.2)
BUN / CREAT RATIO: 17
CALCIUM: 9.7 mg/dL (ref 8.7–10.4)
CHLORIDE: 108 mmol/L — ABNORMAL HIGH (ref 98–107)
CO2: 27.8 mmol/L (ref 20.0–31.0)
CREATININE: 0.66 mg/dL (ref 0.50–0.80)
EGFR CKD-EPI AA FEMALE: >90 mL/min/{1.73_m2}
EGFR CKD-EPI NON-AA FEMALE: >90 mL/min/{1.73_m2}
GLUCOSE RANDOM: 94 mg/dL (ref 70–179)
POTASSIUM: 4 mmol/L (ref 3.5–5.1)
PROTEIN TOTAL: 7.7 g/dL (ref 5.7–8.2)
SODIUM: 141 mmol/L (ref 135–145)

## 2019-12-17 LAB — C-REACTIVE PROTEIN: C reactive protein:MCnc:Pt:Ser/Plas:Qn:: 4

## 2019-12-17 LAB — HEMATOCRIT: Hematocrit:VFr:Pt:Bld:Qn:: 39.6

## 2019-12-17 LAB — GLUCOSE RANDOM: Glucose:MCnc:Pt:Ser/Plas:Qn:: 94

## 2019-12-17 LAB — VITAMIN B-12: Cobalamins:MCnc:Pt:Ser/Plas:Qn:: 377

## 2019-12-17 LAB — IRON & TIBC
IRON SATURATION (CALC): 12 % — ABNORMAL LOW (ref 15–50)
TRANSFERRIN: 332.6 mg/dL (ref 200.0–380.0)

## 2019-12-17 LAB — FERRITIN: Ferritin:MCnc:Pt:Ser/Plas:Qn:: 18.5

## 2019-12-17 LAB — ERYTHROCYTE SEDIMENTATION RATE: Lab: 54 — ABNORMAL HIGH

## 2019-12-17 LAB — IRON: Iron:MCnc:Pt:Ser/Plas:Qn:: 49

## 2019-12-17 MED ORDER — ESOMEPRAZOLE MAGNESIUM 20 MG CAPSULE,DELAYED RELEASE
ORAL_CAPSULE | Freq: Every day | ORAL | 3 refills | 90.00000 days | Status: CP
Start: 2019-12-17 — End: 2020-12-16

## 2019-12-17 MED ORDER — DESIPRAMINE 25 MG TABLET
ORAL_TABLET | Freq: Every evening | ORAL | 1 refills | 90 days | Status: CP
Start: 2019-12-17 — End: 2020-06-14

## 2019-12-17 MED FILL — HUMIRA PEN CITRATE FREE 40 MG/0.4 ML: SUBCUTANEOUS | 28 days supply | Qty: 4 | Fill #1

## 2019-12-17 MED FILL — HUMIRA PEN CITRATE FREE 40 MG/0.4 ML: 28 days supply | Qty: 4 | Fill #1 | Status: AC

## 2019-12-17 NOTE — Unmapped (Addendum)
Jasmine Mitchell it was a pleasure seeing you today.  Here is a summary/wrap up from today's visit:     1.  Continue Humira weekly injections  2.  Re-start Desipramine. Start at 50mg  daily.  You can increase to 75mg  daily after 1 week.   3.  Try Nexium 20mg  every day.  Take it first thing in the morning to help with burning in your stomach.   4.  Try to keep diary of other foods that might trigger your stomach discomfort/burning.  Try to avoid coffee on an pty stomach.   5.  Update lab work today.  6.  If any trouble or symptoms, do not hesitate to call.     Zetta Bills, MD  Assistant Professor of Medicine  Division of Gastroenterology & Hepatology  [facility] [place]    Clinical Nurse Contact:  Neta Mends, RN  Ph#  [phone]  Fax#  [phone]    Thank you for visiting me in the [facility] (IBD) [facility].  We appreciate the opportunity to care for you and help with your IBD treatment.  As part of being seen in the clinic, I typically expect patients to be seen for appointments every 6 months,    [place] COVID19 Vaccine Information: [email or web address]    For clinic appointments, please call, [phone], option 1.  For questions regarding radiology appointments, or to schedule, [phone].  For questions regarding scheduling GI procedures (e.g,. Colonoscopy), please call, [phone], option 2.    For educational material and resources:  [email or web address]  ============================================

## 2019-12-19 LAB — VITAMIN D, TOTAL (25OH): Lab: 21.3

## 2019-12-22 ENCOUNTER — Other Ambulatory Visit: Payer: Self-pay | Admitting: Obstetrics

## 2019-12-22 DIAGNOSIS — F418 Other specified anxiety disorders: Secondary | ICD-10-CM

## 2020-01-06 DIAGNOSIS — R300 Dysuria: Secondary | ICD-10-CM | POA: Diagnosis not present

## 2020-01-06 DIAGNOSIS — R3121 Asymptomatic microscopic hematuria: Secondary | ICD-10-CM | POA: Diagnosis not present

## 2020-01-13 DIAGNOSIS — K5080 Crohn's disease of both small and large intestine without complications: Principal | ICD-10-CM

## 2020-01-13 MED ORDER — HUMIRA PEN CITRATE FREE 40 MG/0.4 ML
SUBCUTANEOUS | 6 refills | 28.00000 days | Status: CP
Start: 2020-01-13 — End: 2021-01-12
  Filled 2020-01-15: qty 4, 28d supply, fill #0

## 2020-01-13 NOTE — Unmapped (Signed)
[facility] Refill Coordination Note    Specialty Medication(s) to be Shipped:   Inflammatory Disorders: Humira 40mg /0.44ml  Other medication(s) to be shipped: No additional medications requested for fill at this time     Jasmine Mitchell, DOB: [birth year]  Phone: [phone] (home)     All above HIPAA information was verified with patient.     Was a Nurse, learning disability used for this call? No    Completed refill call assessment today to schedule patient's medication shipment from the [facility] [phone]).       Specialty medication(s) and dose(s) confirmed: Regimen is correct and unchanged.   Changes to medications: Timmy reports no changes at this time.  Changes to insurance: No  Questions for the pharmacist: No    Confirmed patient received Welcome Packet with first shipment. The patient will receive a drug information handout for each medication shipped and additional FDA Medication Guides as required.       DISEASE/MEDICATION-SPECIFIC INFORMATION        For patients on injectable medications: Patient currently has 0 doses left.  Next injection is scheduled for 01/17/2020.    SPECIALTY MEDICATION ADHERENCE     Medication Adherence    Patient reported X missed doses in the last month: 0  Specialty Medication: Humira 40mg /0.4ml  Patient is on additional specialty medications: No  Informant: patient  Reliability of informant: reliable        Humira 40mg /0.47ml : 0 days of medicine on hand     SHIPPING     Shipping address confirmed in Epic.     Delivery Scheduled: Yes, Expected medication delivery date: 01/16/2020.     Medication will be delivered via UPS to the prescription address in Epic WAM.    Alric Geise P Wetzel [place] [facility] Specialty Technician

## 2020-01-15 MED FILL — HUMIRA PEN CITRATE FREE 40 MG/0.4 ML: 28 days supply | Qty: 4 | Fill #0 | Status: AC

## 2020-02-12 NOTE — Unmapped (Signed)
[facility] Refill Coordination Note    Specialty Medication(s) to be Shipped:   Inflammatory Disorders: Humira    Other medication(s) to be shipped: No additional medications requested for fill at this time     Jasmine Mitchell, DOB: March 30, 1971  Phone: [phone] (home)       All above HIPAA information was verified with patient.     Was a Nurse, learning disability used for this call? No    Completed refill call assessment today to schedule patient's medication shipment from the [facility] [phone]).       Specialty medication(s) and dose(s) confirmed: Regimen is correct and unchanged.   Changes to medications: [name] reports no changes at this time.  Changes to insurance: No  Questions for the pharmacist: No    Confirmed patient received Welcome Packet with first shipment. The patient will receive a drug information handout for each medication shipped and additional FDA Medication Guides as required.       DISEASE/MEDICATION-SPECIFIC INFORMATION        For patients on injectable medications: Patient currently has 0 doses left.  Next injection is scheduled for 02/14/20.    SPECIALTY MEDICATION ADHERENCE     Medication Adherence    Patient reported X missed doses in the last month: 0  Specialty Medication: Humira  Patient is on additional specialty medications: No                Humira 40/0.4 mg/ml: 0 days of medicine on hand          SHIPPING     Shipping address confirmed in Epic.     Delivery Scheduled: Yes, Expected medication delivery date: 02/14/20.     Medication will be delivered via UPS to the prescription address in Epic WAM.    Unk Lightning   [facility] Specialty Technician

## 2020-02-13 MED FILL — HUMIRA PEN CITRATE FREE 40 MG/0.4 ML: SUBCUTANEOUS | 28 days supply | Qty: 4 | Fill #1

## 2020-02-13 MED FILL — HUMIRA PEN CITRATE FREE 40 MG/0.4 ML: 28 days supply | Qty: 4 | Fill #1 | Status: AC

## 2020-02-14 ENCOUNTER — Ambulatory Visit: Payer: BC Managed Care – PPO | Admitting: Adult Health

## 2020-02-14 ENCOUNTER — Other Ambulatory Visit: Payer: Self-pay

## 2020-02-14 ENCOUNTER — Encounter: Payer: Self-pay | Admitting: Adult Health

## 2020-02-14 VITALS — BP 108/82 | HR 84 | Temp 97.9°F | Ht 64.0 in | Wt 246.0 lb

## 2020-02-14 DIAGNOSIS — H938X2 Other specified disorders of left ear: Secondary | ICD-10-CM

## 2020-02-14 DIAGNOSIS — Z23 Encounter for immunization: Secondary | ICD-10-CM

## 2020-02-14 NOTE — Patient Instructions (Signed)
Health Maintenance Due  Topic Date Due  . COVID-19 Vaccine (1) Never done  . INFLUENZA VACCINE  01/19/2020    No flowsheet data found.

## 2020-02-14 NOTE — Progress Notes (Signed)
Subjective:    Patient ID: Katrina Grant, female    DOB: 01/11/1971, 49 y.o.   MRN: [id number]  HPI 49 year old female who  has a past medical history of ADD (attention deficit disorder), Anxiety, Cholesterol serum elevated, Chronic kidney disease, Crohn's disease of colon with fistula ([place]), Depression, Fibromyalgia - managed by Dr. Dossie Der, [facility] - takes Neurontin and Tramadol (11/20/2013), Frequent headaches, GERD (gastroesophageal reflux disease), Osteoarthritis, Pneumonia, and Syncope.  She presents to the office today for an acute issue. She reports that she was cleaning out her ear with a Q tip and when she pulled the Qtip out the cotton end came off and stayed in her ear. She has been unable to get anything out. Feel as though she has something still stuck in her ear.    Review of Systems See HPI   Past Medical History:  Diagnosis Date   ADD (attention deficit disorder)    Anxiety    Cholesterol serum elevated    on medication in the past   Chronic kidney disease    Crohn's disease of colon with fistula ([place])    sees Dr. Coralyn Mark, [facility]   Depression    Fibromyalgia - managed by Dr. Dossie Der, [facility] - takes Neurontin and Tramadol 11/20/2013   Frequent headaches    GERD (gastroesophageal reflux disease)    Osteoarthritis    knee, spine   Pneumonia    Syncope     Social History   Socioeconomic History   Marital status: Married    Spouse name: Not on file   Number of children: Not on file   Years of education: Not on file   Highest education level: Not on file  Occupational History   Not on file  Tobacco Use   Smoking status: Never Smoker   Smokeless tobacco: Never Used  Substance and Sexual Activity   Alcohol use: Not Currently    Alcohol/week: 0.0 standard drinks   Drug use: No   Sexual activity: Yes    Birth control/protection: Surgical  Other Topics Concern   Not on file  Social History Narrative   Work or School: on  disability for disability related to Crohn's and managed by Dr. Coralyn Mark; works part time      Home Situation: lives with boyfriend and son (74 yo) in 2015      Spiritual Beliefs: Christian      Lifestyle: regular exercise - walking 3x per week; diet is healthy            Social Determinants of Radio broadcast assistant Strain:    Difficulty of Paying Living Expenses: Not on file  Food Insecurity:    Worried About Charity fundraiser in the Last Year: Not on file   [employer] of Food in the Last Year: Not on file  Transportation Needs:    Lack of Transportation (Medical): Not on file   Lack of Transportation (Non-Medical): Not on file  Physical Activity:    Days of Exercise per Week: Not on file   Minutes of Exercise per Session: Not on file  Stress:    Feeling of Stress : Not on file  Social Connections:    Frequency of Communication with Friends and Family: Not on file   Frequency of Social Gatherings with Friends and Family: Not on file   Attends Religious Services: Not on file   Active Member of Clubs or Organizations: Not on file   Attends Club or  Organization Meetings: Not on file   Marital Status: Not on file  Intimate Partner Violence:    Fear of Current or Ex-Partner: Not on file   Emotionally Abused: Not on file   Physically Abused: Not on file   Sexually Abused: Not on file    Past Surgical History:  Procedure Laterality Date   anal fistula repair      X 4    colonoscopy with dilation     X 2    FRACTURE SURGERY     TUBAL LIGATION      Family History  Problem Relation Age of Onset   Hypertension Mother    ADD / ADHD Mother    Anxiety disorder Mother    Arthritis Mother    Depression Mother    Varicose Veins Mother    Diabetes Father    Hearing loss Father    Heart disease Father    Cancer Sister        cervical   ADD / ADHD Sister    Anxiety disorder Sister    Arthritis Sister    Depression Sister    Early  death Sister    Varicose Veins Sister    Mental illness Brother        drugs and alcohol abuse   ADD / ADHD Brother    Alcohol abuse Brother    Asthma Brother    Depression Brother    Drug abuse Brother    ADD / ADHD Daughter     Allergies  Allergen Reactions   Percocet [Oxycodone-Acetaminophen] Nausea And Vomiting    Current Outpatient Medications on File Prior to Visit  Medication Sig Dispense Refill   ACCU-CHEK FASTCLIX LANCETS MISC Use as directed to check blood sugar once day 102 each 0   acetaminophen (TYLENOL) 500 MG tablet Take 1,000 mg by mouth every 6 (six) hours as needed. For migraines and stomach upset      Adalimumab (HUMIRA) 40 MG/0.8ML PSKT Inject 40 mg into the skin once a week.     buPROPion (WELLBUTRIN XL) 300 MG 24 hr tablet TAKE 1 TABLET (300 MG TOTAL) BY MOUTH DAILY AFTER BREAKFAST. 90 tablet 4   dicyclomine (BENTYL) 10 MG capsule Take 10 mg by mouth 3 (three) times daily.      fluconazole (DIFLUCAN) 200 MG tablet Take 1 tablet (200 mg total) by mouth every 3 (three) days. 3 tablet 2   fluticasone (FLONASE) 50 MCG/ACT nasal spray PLACE 1 SPRAY INTO BOTH NOSTRILS DAILY. PATIENT NEEDS AN APPOINTMENT 16 mL 5   glucose blood (ACCU-CHEK GUIDE) test strip Use as instructed 100 each 1   ondansetron (ZOFRAN ODT) 8 MG disintegrating tablet Take 1 tablet (8 mg total) by mouth every 8 (eight) hours as needed for nausea or vomiting. 30 tablet 0   prenatal vitamin w/FE, FA (PRENATAL 1 + 1) 27-1 MG TABS tablet Take 1 tablet by mouth daily before breakfast. 30 tablet 11   sulfamethoxazole-trimethoprim (BACTRIM DS) 800-160 MG tablet Take 1 tablet by mouth 2 (two) times daily. 30 tablet 1   desipramine (NORPRAMIN) 25 MG tablet Take by mouth 3 (three) times daily.      No current facility-administered medications on file prior to visit.    BP 108/82    Pulse 84    Temp 97.9 F (36.6 C) (Other (Comment))    Ht 5' 4"  (1.626 m)    Wt 246 lb (111.6 kg)    SpO2  99%    BMI 42.23  kg/m       Objective:   Physical Exam Vitals and nursing note reviewed.  Constitutional:      Appearance: Normal appearance.  HENT:     Right Ear: Tympanic membrane, ear canal and external ear normal.     Left Ear: Tympanic membrane, ear canal and external ear normal.     Ears:     Comments: No signs of foreign object in ear Skin:    General: Skin is warm and dry.  Neurological:     General: No focal deficit present.     Mental Status: She is alert and oriented to person, place, and time.  Psychiatric:        Mood and Affect: Mood normal.        Behavior: Behavior normal.       Assessment & Plan:  1. Sensation of fullness in left ear - No foreign body was noticed. Ear was irrigated just to ensure and nothing was removed from ear.  - Follow up PRN   Dorothyann Peng, NP

## 2020-02-26 ENCOUNTER — Telehealth: Payer: BC Managed Care – PPO | Admitting: Family Medicine

## 2020-02-26 DIAGNOSIS — R3121 Asymptomatic microscopic hematuria: Secondary | ICD-10-CM | POA: Diagnosis not present

## 2020-02-26 DIAGNOSIS — R351 Nocturia: Secondary | ICD-10-CM | POA: Diagnosis not present

## 2020-02-26 DIAGNOSIS — R300 Dysuria: Secondary | ICD-10-CM | POA: Diagnosis not present

## 2020-03-10 NOTE — Unmapped (Signed)
[facility] Refill Coordination Note    Specialty Medication(s) to be Shipped:   Inflammatory Disorders: Humira 40mg /0.27ml  Other medication(s) to be shipped: No additional medications requested for fill at this time     Jasmine Mitchell, DOB: Oct 09, 1970  Phone: [phone] (home)     All above HIPAA information was verified with patient.     Was a Nurse, learning disability used for this call? No    Completed refill call assessment today to schedule patient's medication shipment from the [facility] [phone]).       Specialty medication(s) and dose(s) confirmed: Regimen is correct and unchanged.   Changes to medications: Summerlynn reports no changes at this time.  Changes to insurance: No  Questions for the pharmacist: No    Confirmed patient received Welcome Packet with first shipment. The patient will receive a drug information handout for each medication shipped and additional FDA Medication Guides as required.       DISEASE/MEDICATION-SPECIFIC INFORMATION        For patients on injectable medications: Patient currently has 0 doses left.  Next injection is scheduled for 03/13/2020.    SPECIALTY MEDICATION ADHERENCE     Medication Adherence    Patient reported X missed doses in the last month: 0  Specialty Medication: Humira 40mg /0.27ml  Patient is on additional specialty medications: No  Informant: patient  Reliability of informant: reliable        Humira 40mg /0.40ml : 0 days of medicine on hand     SHIPPING     Shipping address confirmed in Epic.     Delivery Scheduled: Yes, Expected medication delivery date: 03/12/2020.     Medication will be delivered via UPS to the prescription address in Epic WAM.    Annebelle Bostic P [facility] [place] [facility] Specialty Technician

## 2020-03-11 MED FILL — HUMIRA PEN CITRATE FREE 40 MG/0.4 ML: SUBCUTANEOUS | 28 days supply | Qty: 4 | Fill #2

## 2020-03-11 MED FILL — HUMIRA PEN CITRATE FREE 40 MG/0.4 ML: 28 days supply | Qty: 4 | Fill #2 | Status: AC

## 2020-03-30 ENCOUNTER — Telehealth: Payer: Self-pay | Admitting: *Deleted

## 2020-03-30 ENCOUNTER — Encounter: Payer: Self-pay | Admitting: Family Medicine

## 2020-03-30 ENCOUNTER — Telehealth (INDEPENDENT_AMBULATORY_CARE_PROVIDER_SITE_OTHER): Payer: BC Managed Care – PPO | Admitting: Family Medicine

## 2020-03-30 DIAGNOSIS — R7301 Impaired fasting glucose: Secondary | ICD-10-CM | POA: Diagnosis not present

## 2020-03-30 DIAGNOSIS — M2550 Pain in unspecified joint: Secondary | ICD-10-CM

## 2020-03-30 DIAGNOSIS — R5383 Other fatigue: Secondary | ICD-10-CM

## 2020-03-30 DIAGNOSIS — E559 Vitamin D deficiency, unspecified: Secondary | ICD-10-CM

## 2020-03-30 DIAGNOSIS — K50919 Crohn's disease, unspecified, with unspecified complications: Secondary | ICD-10-CM | POA: Diagnosis not present

## 2020-03-30 DIAGNOSIS — G5603 Carpal tunnel syndrome, bilateral upper limbs: Secondary | ICD-10-CM | POA: Diagnosis not present

## 2020-03-30 DIAGNOSIS — E538 Deficiency of other specified B group vitamins: Secondary | ICD-10-CM

## 2020-03-30 DIAGNOSIS — Z13220 Encounter for screening for lipoid disorders: Secondary | ICD-10-CM

## 2020-03-30 NOTE — Telephone Encounter (Signed)
Spoke with the pt and scheduled a lab appt for 10/14.  Patient is aware the Rx for [place] was left at the front desk for pick up.

## 2020-03-30 NOTE — Telephone Encounter (Signed)
-----   Message from Caren Macadam, MD sent at 03/30/2020 12:02 PM EDT ----- Please set up lab visit for her and we need to order bilat carpal tunnel splints for her for med supply (we can put order up front for her to pick up when she comes for bloodwork)

## 2020-03-30 NOTE — Progress Notes (Signed)
Virtual Visit via Video Note  I connected with Katrina Grant on 03/30/20 at 11:30 AM EDT by a video enabled telemedicine application and verified that I am speaking with the correct person using two identifiers.  Location patient: home Location provider: [place], [place] [address] Persons participating in the virtual visit: patient, provider  I discussed the limitations of evaluation and management by telemedicine and the availability of in person appointments. The patient expressed understanding and agreed to proceed.   Katrina Grant DOB: Nov 01, 1970 Encounter date: 03/30/2020  This is a 49 y.o. female who presents with Chief Complaint  Patient presents with  . Hand Pain    patient complains of numbness in the right thumb, numbness present in the palm x2 months, worse lately, also complains of left wrist pain x3-4 months, stiffness present, history of fracture    History of present illness: Does type a lot; getting nerve pain in middle of hand below thumb. Some soreness in thumb, but also some numbness. Happening in left hand as well, but right hand is worse. Pretty much using mouse all day. Has been bothering her for last couple of months. Now bothering her every day. No swelling in fingers, wrists. Hands feel stiff though. Did break left wrist in 2003; getting some pain/arthritis pain in that wrist. No numbness/tingling in fingers. Does not that pinky on right hand has some tenderness in pip. No sticking of fingers. Just feels some tightness in joints. Not hurting all the time; just flares more with use. Does feel like there is some weakness in thumb. Has had to set stuff down quickly.   Has been wearing carpal tunnel brace but not really doing anything for her; doesn't fit tightly around hand.   Desipramine started by GI in [place]; has increased to 4/day.   She has aches and pains every day. Neck, shoulders, lower back. Uses heating pad  regularly. Pain is more in muscles of neck. Knees bother her. Not sure if related to weight gain. Sometimes lock up on her. Doesn't feel swelling in joints, just stiffness/pain; although maybe fluid on right knee. Joints all worse with crohns/GI flares. Takes her awhile to get moving in the morning or in middle of night. Takes her 10-15 minutes to get going in the morning.   Spider veins of legs getting worse. Not painful.   Pinching nerve sensation in feet. Feels like bug has bitten her.   Has medically induced fibromyalgia - she has been told. That Humira is causing aches and pains.    Allergies  Allergen Reactions  . Percocet [Oxycodone-Acetaminophen] Nausea And Vomiting   Current Meds  Medication Sig  . ACCU-CHEK FASTCLIX LANCETS MISC Use as directed to check blood sugar once day  . acetaminophen (TYLENOL) 500 MG tablet Take 1,000 mg by mouth every 6 (six) hours as needed. For migraines and stomach upset   . Adalimumab (HUMIRA) 40 MG/0.8ML PSKT Inject 40 mg into the skin once a week.  [place] Kitchen buPROPion (WELLBUTRIN XL) 300 MG 24 hr tablet TAKE 1 TABLET (300 MG TOTAL) BY MOUTH DAILY AFTER BREAKFAST.  . fluticasone (FLONASE) 50 MCG/ACT nasal spray PLACE 1 SPRAY INTO BOTH NOSTRILS DAILY. PATIENT NEEDS AN APPOINTMENT  . glucose blood (ACCU-CHEK GUIDE) test strip Use as instructed  . ondansetron (ZOFRAN ODT) 8 MG disintegrating tablet Take 1 tablet (8 mg total) by mouth every 8 (eight) hours as needed for nausea or vomiting.  . prenatal vitamin  w/FE, FA (PRENATAL 1 + 1) 27-1 MG TABS tablet Take 1 tablet by mouth daily before breakfast.  . sulfamethoxazole-trimethoprim (BACTRIM DS) 800-160 MG tablet Take 1 tablet by mouth 2 (two) times daily.  . [DISCONTINUED] dicyclomine (BENTYL) 10 MG capsule Take 10 mg by mouth 3 (three) times daily.   . [DISCONTINUED] fluconazole (DIFLUCAN) 200 MG tablet Take 1 tablet (200 mg total) by mouth every 3 (three) days.    Review of Systems  Constitutional: Negative  for chills, fatigue and fever.  Respiratory: Negative for cough, chest tightness, shortness of breath and wheezing.   Cardiovascular: Negative for chest pain, palpitations and leg swelling.  Musculoskeletal: Negative for neck pain and neck stiffness. Arthralgias: somewhat wrists.  Neurological: Positive for weakness and numbness.    Objective:  There were no vitals taken for this visit.      BP Readings from Last 3 Encounters:  02/14/20 108/82  10/22/19 118/84  08/23/19 120/72   Wt Readings from Last 3 Encounters:  02/14/20 246 lb (111.6 kg)  10/22/19 249 lb (112.9 kg)  08/23/19 246 lb 8 oz (111.8 kg)    EXAM:  GENERAL: alert, oriented, appears well and in no acute distress  HEENT: atraumatic, conjunctiva clear, no obvious abnormalities on inspection of external nose and ears  NECK: normal movements of the head and neck  LUNGS: on inspection no signs of respiratory distress, breathing rate appears normal, no obvious gross SOB, gasping or wheezing  CV: no obvious cyanosis  MS: moves all visible extremities without noticeable abnormality  PSYCH/NEURO: pleasant and cooperative, no obvious depression or anxiety, speech and thought processing grossly intact   Assessment/Plan  1. Bilateral carpal tunnel syndrome We will writing prescription for bilateral wrist splints.  Not having if she has good fitting wrist splints this will help alleviate some of the pressure on her median nerve.  If this does not help in the next couple weeks, she should see a hand specialist.  She is hesitant to proceed with that due to worry about injection at this point.  2. Arthralgia, unspecified joint Ongoing joint pains. - Sedimentation rate; Future - ANA; Future - C-reactive protein; Future - Uric acid; Future - Rheumatoid factor; Future  3. Crohn's disease with complication, unspecified gastrointestinal tract location [facility]) This has been stable on current medication: humira 15m weekly.  However, joint pain worsened once bowels were stable.  4. Impaired fasting glucose We will recheck blood work.  We discussed low inflammatory diet.  5. Vitamin D deficiency - VITAMIN D 25 Hydroxy (Vit-D Deficiency, Fractures); Future  6. B12 deficiency - Vitamin B12; Future  7. Lipid screening - Hemoglobin A1c; Future - Lipid panel; Future  8. Other fatigue - CBC with Differential/Platelet; Future - Comprehensive metabolic panel; Future - TSH; Future  I discussed the assessment and treatment plan with the patient. The patient was provided an opportunity to ask questions and all were answered. The patient agreed with the plan and demonstrated an understanding of the instructions.   The patient was advised to call back or seek an in-person evaluation if the symptoms worsen or if the condition fails to improve as anticipated.  I provided 30 minutes of non-face-to-face time during this encounter.   JMicheline Rough MD

## 2020-04-02 ENCOUNTER — Other Ambulatory Visit: Payer: BC Managed Care – PPO

## 2020-04-06 NOTE — Unmapped (Signed)
[facility] Clinical Assessment & Refill Coordination Note    Morna Flud, [place]: 1970-09-13  Phone: [phone] (home)     All above HIPAA information was verified with patient.     Was a Nurse, learning disability used for this call? No    Specialty Medication(s):   Inflammatory Disorders: Humira     Current Outpatient Medications   Medication Sig Dispense Refill   ??? acetaminophen (TYLENOL) 500 MG tablet Take 1,000 mg by mouth every four (4) hours as needed for pain.     ??? buPROPion (WELLBUTRIN XL) 150 MG 24 hr tablet Take 150 mg by mouth daily.     ??? desipramine (NORPRAMIN) 25 MG tablet Take 3 tablets (75 mg total) by mouth nightly. 270 tablet 1   ??? dicyclomine (BENTYL) 10 mg capsule TAKE 1 CAPSULE BY MOUTH 3 TIMES A DAY (Patient not taking: Reported on 10/28/2019) 90 capsule 3   ??? esomeprazole (NEXIUM) 20 MG capsule Take 1 capsule (20 mg total) by mouth every morning before breakfast. 90 capsule 3   ??? fluticasone (FLONASE) 50 mcg/actuation nasal spray 1 SPRAY BY EACH NARE ROUTE DAILY. AS DIRECTED (Patient taking differently: 1 spray by Each Nare route daily as needed. As directed) 16 mL 2   ??? HUMIRA PEN CITRATE FREE 40 MG/0.4 ML Inject the contents of 1 pen (40 mg total) under the skin once a week. 4 each 6   ??? loratadine (CLARITIN) 10 mg tablet Take 10 mg by mouth once as needed.      ??? MULTIVIT WITH CALCIUM,IRON,MIN (WOMEN'S DAILY MULTIVITAMIN ORAL) Take 1 tablet by mouth once daily.     ??? ondansetron (ZOFRAN-ODT) 8 MG disintegrating tablet Take 8 mg by mouth every eight (8) hours as needed.      ??? sulfamethoxazole-trimethoprim (BACTRIM DS) 800-160 mg per tablet Take 1 tablet by mouth.       No current facility-administered medications for this visit.        Changes to medications: Georgiann reports no changes at this time.    Allergies   Allergen Reactions   ??? Oxycodone (Bulk) Nausea And Vomiting     Patient tolerates acetaminophen well       Changes to allergies: No    SPECIALTY MEDICATION ADHERENCE     Humira 40/0.4 mg/ml: 0 days of medicine on hand       Medication Adherence    Patient reported X missed doses in the last month: 0  Specialty Medication: Humira 40  Patient is on additional specialty medications: No  Informant: patient          Specialty medication(s) dose(s) confirmed: Regimen is correct and unchanged.     Are there any concerns with adherence? No    Adherence counseling provided? Not needed    CLINICAL MANAGEMENT AND INTERVENTION      Clinical Benefit Assessment:    Do you feel the medicine is effective or helping your condition? Yes    Clinical Benefit counseling provided? Not needed    Adverse Effects Assessment:    Are you experiencing any side effects? No    Are you experiencing difficulty administering your medicine? No    Quality of Life Assessment:    How many days over the past month did your CRohn  keep you from your normal activities? For example, brushing your teeth or getting up in the morning. 0    Have you discussed this with your provider? Not needed    Therapy Appropriateness:  Is therapy appropriate? Yes, therapy is appropriate and should be continued    DISEASE/MEDICATION-SPECIFIC INFORMATION      For patients on injectable medications: Patient currently has 0 doses left.  Next injection is scheduled for 10/22 but will inject next week on 10/26 as she is out of town.    PATIENT SPECIFIC NEEDS     - Does the patient have any physical, cognitive, or cultural barriers? No    - Is the patient high risk? No    - Does the patient require a Care Management Plan? No     - Does the patient require physician intervention or other additional services (i.e. nutrition, smoking cessation, social work)? No      SHIPPING     Specialty Medication(s) to be Shipped:   Inflammatory Disorders: Humira    Other medication(s) to be shipped: No additional medications requested for fill at this time     Changes to insurance: No    Delivery Scheduled: Yes, Expected medication delivery date: 10/26, pt going out of town tomorrow .     Medication will be delivered via UPS to the confirmed prescription address in [facility].    The patient will receive a drug information handout for each medication shipped and additional FDA Medication Guides as required.  Verified that patient has previously received a Conservation officer, historic buildings.    All of the patient's questions and concerns have been addressed.    Julianne Rice   [facility] Specialty Pharmacist

## 2020-04-13 MED FILL — HUMIRA PEN CITRATE FREE 40 MG/0.4 ML: 28 days supply | Qty: 4 | Fill #3 | Status: AC

## 2020-04-13 MED FILL — HUMIRA PEN CITRATE FREE 40 MG/0.4 ML: SUBCUTANEOUS | 28 days supply | Qty: 4 | Fill #3

## 2020-04-14 ENCOUNTER — Other Ambulatory Visit (INDEPENDENT_AMBULATORY_CARE_PROVIDER_SITE_OTHER): Payer: BC Managed Care – PPO

## 2020-04-14 ENCOUNTER — Other Ambulatory Visit: Payer: Self-pay

## 2020-04-14 DIAGNOSIS — M2550 Pain in unspecified joint: Secondary | ICD-10-CM | POA: Diagnosis not present

## 2020-04-14 DIAGNOSIS — R5383 Other fatigue: Secondary | ICD-10-CM

## 2020-04-14 DIAGNOSIS — E538 Deficiency of other specified B group vitamins: Secondary | ICD-10-CM | POA: Diagnosis not present

## 2020-04-14 DIAGNOSIS — Z13220 Encounter for screening for lipoid disorders: Secondary | ICD-10-CM | POA: Diagnosis not present

## 2020-04-14 DIAGNOSIS — E559 Vitamin D deficiency, unspecified: Secondary | ICD-10-CM | POA: Diagnosis not present

## 2020-04-16 LAB — COMPREHENSIVE METABOLIC PANEL
AG Ratio: 1.2 (calc) (ref 1.0–2.5)
ALT: 17 U/L (ref 6–29)
AST: 18 U/L (ref 10–35)
Albumin: 3.9 g/dL (ref 3.6–5.1)
Alkaline phosphatase (APISO): 74 U/L (ref 31–125)
BUN: 16 mg/dL (ref 7–25)
CO2: 29 mmol/L (ref 20–32)
Calcium: 9.4 mg/dL (ref 8.6–10.2)
Chloride: 104 mmol/L (ref 98–110)
Creat: 0.76 mg/dL (ref 0.50–1.10)
Globulin: 3.2 g/dL (calc) (ref 1.9–3.7)
Glucose, Bld: 86 mg/dL (ref 65–99)
Potassium: 4.4 mmol/L (ref 3.5–5.3)
Sodium: 139 mmol/L (ref 135–146)
Total Bilirubin: 0.6 mg/dL (ref 0.2–1.2)
Total Protein: 7.1 g/dL (ref 6.1–8.1)

## 2020-04-16 LAB — CBC WITH DIFFERENTIAL/PLATELET
Absolute Monocytes: 319 cells/uL (ref 200–950)
Basophils Absolute: 11 cells/uL (ref 0–200)
Basophils Relative: 0.2 %
Eosinophils Absolute: 129 cells/uL (ref 15–500)
Eosinophils Relative: 2.3 %
HCT: 40.5 % (ref 35.0–45.0)
Hemoglobin: 13 g/dL (ref 11.7–15.5)
Lymphs Abs: 2526 cells/uL (ref 850–3900)
MCH: 29.2 pg (ref 27.0–33.0)
MCHC: 32.1 g/dL (ref 32.0–36.0)
MCV: 91 fL (ref 80.0–100.0)
MPV: 10.4 fL (ref 7.5–12.5)
Monocytes Relative: 5.7 %
Neutro Abs: 2615 cells/uL (ref 1500–7800)
Neutrophils Relative %: 46.7 %
Platelets: 250 10*3/uL (ref 140–400)
RBC: 4.45 10*6/uL (ref 3.80–5.10)
RDW: 13.1 % (ref 11.0–15.0)
Total Lymphocyte: 45.1 %
WBC: 5.6 10*3/uL (ref 3.8–10.8)

## 2020-04-16 LAB — TSH: TSH: 3.03 mIU/L

## 2020-04-16 LAB — HEMOGLOBIN A1C
Hgb A1c MFr Bld: 5.6 % of total Hgb (ref <5.7)
Mean Plasma Glucose: 114 (calc)
eAG (mmol/L): 6.3 (calc)

## 2020-04-16 LAB — LIPID PANEL
Cholesterol: 197 mg/dL (ref <200)
HDL: 66 mg/dL (ref >=50)
LDL Cholesterol (Calc): 105 mg/dL (calc) — ABNORMAL HIGH
Non-HDL Cholesterol (Calc): 131 mg/dL (calc) — ABNORMAL HIGH (ref <130)
Total CHOL/HDL Ratio: 3 (calc) (ref <5.0)
Triglycerides: 138 mg/dL (ref <150)

## 2020-04-16 LAB — ANA: Anti Nuclear Antibody (ANA): NEGATIVE

## 2020-04-16 LAB — SEDIMENTATION RATE: Sed Rate: 25 mm/h — ABNORMAL HIGH (ref 0–20)

## 2020-04-16 LAB — URIC ACID: Uric Acid, Serum: 4.8 mg/dL (ref 2.5–7.0)

## 2020-04-16 LAB — RHEUMATOID FACTOR: Rheumatoid fact SerPl-aCnc: <14 IU/mL (ref <14)

## 2020-04-16 LAB — VITAMIN B12: Vitamin B-12: 456 pg/mL (ref 200–1100)

## 2020-04-16 LAB — VITAMIN D 25 HYDROXY (VIT D DEFICIENCY, FRACTURES): Vit D, 25-Hydroxy: 24 ng/mL — ABNORMAL LOW (ref 30–100)

## 2020-04-16 LAB — C-REACTIVE PROTEIN: CRP: 5.6 mg/L (ref <8.0)

## 2020-04-23 ENCOUNTER — Other Ambulatory Visit: Payer: Self-pay | Admitting: Obstetrics

## 2020-04-23 DIAGNOSIS — N390 Urinary tract infection, site not specified: Secondary | ICD-10-CM

## 2020-04-23 DIAGNOSIS — K580 Irritable bowel syndrome with diarrhea: Principal | ICD-10-CM

## 2020-04-23 MED ORDER — DESIPRAMINE 25 MG TABLET
ORAL_TABLET | 1 refills | 0 days
Start: 2020-04-23 — End: ?

## 2020-04-24 MED ORDER — DESIPRAMINE 25 MG TABLET
ORAL_TABLET | ORAL | 1 refills | 0.00000 days | Status: CP
Start: 2020-04-24 — End: 2020-08-07

## 2020-05-11 NOTE — Unmapped (Signed)
[facility] has made a third and final attempt to reach this patient to refill the following medication:  Humira 40mg /0.20ml.      We have left voicemails on the following phone numbers: [phone] (H) and have sent a MyChart message.    Dates contacted: 11/11, 11/16 & 11/22     Last scheduled delivery: 04/13/2020    The patient may be at risk of non-compliance with this medication. The patient should call the [facility] at [phone] (option 4) to refill medication.    Jenai Scaletta Leodis Binet   [facility] Specialty Technician

## 2020-05-26 DIAGNOSIS — M79645 Pain in left finger(s): Secondary | ICD-10-CM | POA: Diagnosis not present

## 2020-05-26 DIAGNOSIS — M79642 Pain in left hand: Secondary | ICD-10-CM | POA: Diagnosis not present

## 2020-05-29 NOTE — Unmapped (Deleted)
[facility]   FOLLOWUP NOTE - INFLAMMATORY BOWEL DISEASE  05/29/2020    Demographics:  Jasmine Mitchell is a 49 y.o. year old female    Diagnosis:  Crohn's Disease  Disease onset (yr): 70  Age at onset:   < 45 yr old (A1)  Location:  Ileocolonic (L3) (mostly colonic)  Behavior:  Perianal (P), Stricturing (B2)  Current Tight Control Scenario:   Maintenance = Biologic          HPI / NOTE :     Interval Events:   1.  Last seen by me 11/2019.  Remains on weekly Humira.  We added desipramine 50mg  for IBS-D and added nexium 20mg  daily for dyspepsia.  She was also on Wellbutrin 150mg  for anxiety.   2.  Annual labs from 12/17/2019 looked ok - sed rate little high, CRP nml, CBC normal, Iron levels low nml.     HPI:  ***    (update labs, ?desipramine, nexium)    Abdominal pain (0-10): mild  BM a day: ~4x/day  Consistency: soft  % of stools have blood: 0%  Nocturnal BM: rare  Urgency:  mild  Weight change over last 6 mo: stable  Smoking:  no  NSAIDS: avoids    Review of Systems:   Review of systems positive for: negative except as above.   Otherwise, the balance of 10 systems is negative.          IBD HISTORY:     Brief IBD Disease Course:    - 1 - dx with Crohn's dz ~age 70, sx of diarrhea, abd pain, rectal pain and bloody stools.  Treated with sulfasalazine, Asacol, Pentasa without benefit.  Got courses of steroids and antibiotics on and off.  Tried on but had nausea.   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    - 2004 - Remicade x 9 months. Stopped in 2005 due to 2ndary loss of response.   - 2005 - developed a pyoderma lesion.   - 2010 - Re-established care with Dr. Marcella Dubs.  Concern for internal fistulizing disease.  Was on Cimzia. Had surgery for perianal disease.   - 2011 - Started on Humira.   - 2012 - active perianal disease with a large abscess.  Required EUA, drainage, setons ([place]).   - 2013 - April, dx with pelvic floor dyssynergia and anal sphincter weakness, underwent biofeedback x 2 courses ([place] [facility] [place]).   - 2014 - on - doing well on Humira and long term Cipro.  Having on and off bowel symptoms symptoms which have been thought to be related to intermittent IBS symptoms.     Endoscopy:      - Colonoscopy 06/17/2003: patch inflammation and ulcers throughout the colon.    - Colonoscopy 01/2009:  Active crohn's at IC valve. Pseudopolyps.  Retraction of IC valve. ?Rectosigmoid fistula tract.  Otherwise normal colon.   - Colonoscopy 10/08/2009:  Perianal fistula, pseudopolyps.  Few aphthae in TI.   - Colonoscopy 07/29/2010:  Poor colon prep, pseudopolyps, stricture at IC valve.  Few ulcers at IC valve. Rectal stricture. Ileal crohn's disease.   - Colonoscopy 05/05/2011:  Pseduopolyps throughout.  4mm cecal polyp. Large fistula in sigmoid colon. Normal TI.   - Colonoscopy 09/06/2012:  Healed scars from prior fistulotomies on perianal exam. Scar in ascending colon. No active Crohn's.   - Colonoscopy 10/24/13:  Pseudopolyps in rectosigmoid, otherwise normal colon and TI.   - Colonoscopy 10/01/14:  Pseudopolyps in rectum, transverse and  ascending colon.  Otherwise normal colon.  Granular TI.  Fixed and open IC valve (not stenotic).   - Colonoscopy 08/12/16:  Mild anal canal stenosis on exam, otherwise normal colon and ileum.     PATH = chronic quiescent colitis (left and right colon) no dysplasia  - Colonoscopy 01/28/2019 - mild anal stricture (dilated to 20mm via [place]), otherwise normal colon and ileum with no active inflammation    Imaging:    - CT A/P 10/24/2013: mild colonic wall thickening at splenic/descending colon, no other bowel inflammation or stranding.  *Pachy groundglass opacity in left lung base.     Prior IBD medications (type, dose, duration, response):  x 5-ASAs - Sulfasalazine, Asacol, Pentasa  x Oral corticosteroids - Prednisone  ? Intravenous corticosteroids  x Antibiotics - Flagyl - nausea.  Cipro - tolerates well and it helps.   x Thiopurines - - nausea.    TPMT - normal 07/25/17  x Methotrexate - 2005.   x Anti-TNF therapies - Remicade 2004 - 2005 (~6 doses).  Had gradual loss of response so stopped. Tried on Remicade again in 2009 with some effect, increased to 10mg /kg q4 weeks. Cimzia - started 09/2008.  Humira 2014 - dose increased to 80mg  q2 weeks.  04/2016 - humira level 5.6, Ab negative.   ? Cyclosporine  ? Clinical trial medication  ? Other (Please specify):    Extraintestinal manifestations:   -joint pains affecting: n  -eye: n  -skin: n  -oral ulcers :  n  -blood clots: n  -PSC: n  -other: n          Past Medical History:   Past medical history:   Past Medical History:   Diagnosis Date   ??? Acid reflux    ??? Arthritis    ??? Crohn disease (CMS-HCC)    ??? Fibromyalgia    ??? UTI (urinary tract infection)      Past surgical history:   Past Surgical History:   Procedure Laterality Date   ??? FRACTURE SURGERY      left wrist   ??? GASTROCUTANEOUS FISTULA CLOSURE     ??? ORTHOPEDIC SURGERY     ??? PR COLONOSCOPY FLX DX W/COLLJ SPEC WHEN PFRMD N/A 10/24/2013    Procedure: COLONOSCOPY, FLEXIBLE, PROXIMAL TO SPLENIC FLEXURE; DIAGNOSTIC, W/WO COLLECTION SPECIMEN BY BRUSH OR WASH;  Surgeon: Theadore Nan, MD;  [facility];  Service: Gastroenterology   ??? PR COLONOSCOPY W/BIOPSY SINGLE/MULTIPLE N/A 10/01/2014    Procedure: COLONOSCOPY, FLEXIBLE, PROXIMAL TO SPLENIC FLEXURE; WITH BIOPSY, SINGLE OR MULTIPLE;  Surgeon: Teodoro Spray, MD;  [facility];  Service: Gastroenterology   ??? PR COLONOSCOPY W/BIOPSY SINGLE/MULTIPLE N/A 08/12/2016    Procedure: COLONOSCOPY, FLEXIBLE, PROXIMAL TO SPLENIC FLEXURE; WITH BIOPSY, SINGLE OR MULTIPLE;  Surgeon: Zetta Bills, MD;  Location: [facility];  Service: Gastroenterology   ??? PR COLONOSCOPY W/BIOPSY SINGLE/MULTIPLE Left 01/28/2019    Procedure: COLONOSCOPY, FLEXIBLE, PROXIMAL TO SPLENIC FLEXURE; WITH BIOPSY, SINGLE OR MULTIPLE;  Surgeon: Zetta Bills, MD;  Location: [facility]; Service: Gastroenterology   ??? PR COLSC FLX W/RMVL OF TUMOR POLYP LESION SNARE TQ Left 01/28/2019    Procedure: COLONOSCOPY FLEX; W/REMOV TUMOR/LES BY SNARE;  Surgeon: Zetta Bills, MD;  Location: [facility];  Service: Gastroenterology   ??? PR SURG DIAGNOSTIC EXAM, ANORECTAL N/A 12/23/2016    Procedure: ANORECTAL EXAM, SURGICAL, REQUIRING ANESTHESIA (GENERAL, SPINAL, OR EPIDURAL), DIAGNOSTIC;  Surgeon: Mickle Asper, MD;  Location: MAIN OR ;  Service: Gastrointestinal   ??? PR UPPER GI ENDOSCOPY,DIAGNOSIS N/A 08/12/2016    Procedure: UGI ENDO, INCLUDE ESOPHAGUS, STOMACH, & DUODENUM &/OR JEJUNUM; DX W/WO COLLECTION SPECIMN, BY BRUSH OR WASH;  Surgeon: Zetta Bills, MD;  Location: [facility];  Service: Gastroenterology   ??? TUBAL LIGATION       Family history:   Family History   Problem Relation Age of Onset   ??? Cancer Sister    ??? Cancer Paternal Grandmother    ??? Colorectal Cancer Neg Hx      Social history:   Social History     Socioeconomic History   ??? Marital status: Single     Spouse name: Not on file   ??? Number of children: Not on file   ??? Years of education: Not on file   ??? Highest education level: Not on file   Occupational History   ??? Not on file   Tobacco Use   ??? Smoking status: Never Smoker   ??? Smokeless tobacco: Never Used   Substance and Sexual Activity   ??? Alcohol use: Yes     Alcohol/week: 0.0 standard drinks     Comment: socially    ??? Drug use: No   ??? Sexual activity: Not on file   Other Topics Concern   ??? Not on file   Social History Narrative   ??? Not on file     Social Determinants of Health     Financial Resource Strain: Not on file   Food Insecurity: Not on file   Transportation Needs: Not on file   Physical Activity: Not on file   Stress: Not on file   Social Connections: Not on file             Allergies:     Allergies   Allergen Reactions   ??? Oxycodone (Bulk) Nausea And Vomiting     Patient tolerates acetaminophen well             Medications:     Current Outpatient Medications   Medication Sig Dispense Refill   ??? acetaminophen (TYLENOL) 500 MG tablet Take 1,000 mg by mouth every four (4) hours as needed for pain.     ??? buPROPion (WELLBUTRIN XL) 150 MG 24 hr tablet Take 150 mg by mouth daily.     ??? desipramine (NORPRAMIN) 25 MG tablet TAKE 3 TABLETS BY MOUTH NIGHTLY 270 tablet 1   ??? dicyclomine (BENTYL) 10 mg capsule TAKE 1 CAPSULE BY MOUTH 3 TIMES A DAY (Patient not taking: Reported on 10/28/2019) 90 capsule 3   ??? esomeprazole (NEXIUM) 20 MG capsule Take 1 capsule (20 mg total) by mouth every morning before breakfast. 90 capsule 3   ??? fluticasone (FLONASE) 50 mcg/actuation nasal spray 1 SPRAY BY EACH NARE ROUTE DAILY. AS DIRECTED (Patient taking differently: 1 spray by Each Nare route daily as needed. As directed) 16 mL 2   ??? HUMIRA PEN CITRATE FREE 40 MG/0.4 ML Inject the contents of 1 pen (40 mg total) under the skin once a week. 4 each 6   ??? loratadine (CLARITIN) 10 mg tablet Take 10 mg by mouth once as needed.      ??? MULTIVIT WITH CALCIUM,IRON,MIN (WOMEN'S DAILY MULTIVITAMIN ORAL) Take 1 tablet by mouth once daily.     ??? ondansetron (ZOFRAN-ODT) 8 MG disintegrating tablet Take 8 mg by mouth every eight (8) hours as needed.      ??? sulfamethoxazole-trimethoprim (BACTRIM DS) 800-160  mg per tablet Take 1 tablet by mouth.       No current facility-administered medications for this visit.             Physical Exam:   There were no vitals taken for this visit.  ***  GEN:  Obese, well appearing female in no apparent distress, appears comfortable on exam  HEENT: PEERL, OP clear with no erythema, lesions, exudate, mucous membranes moist  NECK: Supple, no lymphadenopathy  PULM: CTAB, no wheezes, rales, or rhonchi  CV: S1/S2, RRR, no murmurs  GI: Soft, non-tender, nondistended, normoactive bowel sounds, no rebound/guarding, no appreciable organomegaly  Extremities: no cyanosis, clubbing or edema, normal gait  Psych: affect appropriate, A&O x3  Skin:  No skin rashes or lesions          Labs, Data & Indices:     Lab Review:   Lab Results   Component Value Date    WBC 5.6 12/17/2019    WBC 6.5 04/29/2016    RBC 4.42 12/17/2019    RBC 4.50 04/29/2016    HGB 13.0 12/17/2019    HGB 13.2 04/29/2016     Lab Results   Component Value Date    AST 18 12/17/2019    AST 13 04/29/2016    ALT 19 12/17/2019    ALT 9 04/29/2016    BUN 11 12/17/2019    BUN 11 04/29/2016    Creatinine 0.66 12/17/2019    Creatinine 0.80 04/29/2016    CO2 27.8 12/17/2019    CO2 21 04/29/2016    Albumin 3.6 12/17/2019    Albumin 4.3 08/19/2014    Calcium 9.7 12/17/2019    Calcium 9.4 04/29/2016     Lab Results   Component Value Date    TSH 2.810 04/29/2016      ............................................................................................................................................  {No diagnosis found. (Refresh or delete this SmartLink)}        Assessment & Recommendations:   Disease state:  Ileocolonic Crohn's with perianal disease and anorectal stricture    Janet Humphreys is a 49 y.o. female with a long standing hx of stricturing ileocolonic Crohn's disease.  She also has hx of perianal disease last requiring drainage by Dr. Elenore Rota (summer 2018).  She is currently maintained on weekly Humira monotherapy in clinical and endoscopic remission. She has concomitant irritable bowel with diarrhea and pain, which has responded well to desipramine in the past.  She does have ongoing issues with stress and anxiety, which has been partially relieved by Wellbutrin. We will restart Desipramine to help with IBS-D, and this may also have some collateral benefits for her anxiety and depression.  Ms. Mcfate also reports upper abdominal symptoms, which sound mostly like dyspepsia.  We will try low dose nexium (20mg  daily) to see if this helps, and I also suggested some dietary modifications for her as well.    ***    PLAN:  1.  Continue Humira weekly injections  2.  Re-start Desipramine. Start at 50mg  daily. You can increase to 75mg  daily after 1 week.   3.  Try Nexium 20mg  every day.  Take it first thing in the morning to help with burning in your stomach.   4.  Try to keep diary of other foods that might trigger your stomach discomfort/burning.  Try to avoid coffee on an pty stomach.   5.  Update lab work today.    IBD health maintenance:  Influenza vaccine: 2012, 117/17  Pneumonia vaccine: Prevnar 02/2014; Pneumovax 3//08/2018  COVID  Vaccine:  Pfizer April 2021  Hepatitis B:   TB testing:   Chickenpox/Shingles history:   Bone denistometry:   Derm appointment:  Last small bowel imaging:   Last colonoscopy: 01/2019  PAP smear:   --------------------------------------------  Author: Zetta Bills 05/29/2020 9:13 AM    Zetta Bills, MD  Assistant Professor of Medicine  Division of Gastroenterology & Hepatology  [place] [facility]

## 2020-06-03 NOTE — Unmapped (Signed)
Patient was a no-show for GI clinic appointment with me today.

## 2020-06-03 NOTE — Unmapped (Signed)
[facility] Refill Coordination Note    Specialty Medication(s) to be Shipped:   Inflammatory Disorders: Humira    Other medication(s) to be shipped: No additional medications requested for fill at this time     Jasmine Mitchell, DOB: [birth year]  Phone: [phone] (home)       All above HIPAA information was verified with patient.     Was a Nurse, learning disability used for this call? No    Completed refill call assessment today to schedule patient's medication shipment from the [facility] [phone]).       Specialty medication(s) and dose(s) confirmed: Regimen is correct and unchanged.   Changes to medications: Jasmine Mitchell reports no changes at this time.  Changes to insurance: No  Questions for the pharmacist: No    Confirmed patient received Welcome Packet with first shipment. The patient will receive a drug information handout for each medication shipped and additional FDA Medication Guides as required.       DISEASE/MEDICATION-SPECIFIC INFORMATION        For patients on injectable medications: Patient currently has 0 doses left.  Next injection is scheduled for 06/05/20.    SPECIALTY MEDICATION ADHERENCE     Medication Adherence    Patient reported X missed doses in the last month: 0  Specialty Medication: Humira 40mg /0.2ml  Patient is on additional specialty medications: No  Informant: patient                SHIPPING     Shipping address confirmed in Epic.     Delivery Scheduled: Yes, Expected medication delivery date: 06/05/20.     Medication will be delivered via UPS to the prescription address in Epic WAM.    Jasmine Mitchell   Jasmine Mitchell [facility] Specialty Technician

## 2020-06-04 MED FILL — HUMIRA PEN CITRATE FREE 40 MG/0.4 ML: 28 days supply | Qty: 4 | Fill #4 | Status: AC

## 2020-06-04 MED FILL — HUMIRA PEN CITRATE FREE 40 MG/0.4 ML: SUBCUTANEOUS | 28 days supply | Qty: 4 | Fill #4

## 2020-06-21 DIAGNOSIS — Z20822 Contact with and (suspected) exposure to covid-19: Secondary | ICD-10-CM | POA: Diagnosis not present

## 2020-06-26 NOTE — Unmapped (Signed)
[facility] Refill Coordination Note    Specialty Medication(s) to be Shipped:   Inflammatory Disorders: Humira 40MG /0.4ML  Other medication(s) to be shipped: No additional medications requested for fill at this time     Jasmine Mitchell, DOB: [birth year]  Phone: [phone] (home)     All above HIPAA information was verified with patient.     Was a Nurse, learning disability used for this call? No    Completed refill call assessment today to schedule patient's medication shipment from the [facility] [phone]).       Specialty medication(s) and dose(s) confirmed: Regimen is correct and unchanged.   Changes to medications: Jasmine Mitchell reports no changes at this time.  Changes to insurance: No  Questions for the pharmacist: No    Confirmed patient received Welcome Packet with first shipment. The patient will receive a drug information handout for each medication shipped and additional FDA Medication Guides as required.       DISEASE/MEDICATION-SPECIFIC INFORMATION        For patients on injectable medications: Patient currently has 2 doses left.  Next injection is scheduled for 07/03/2020.    SPECIALTY MEDICATION ADHERENCE     Medication Adherence    Patient reported X missed doses in the last month: 0  Specialty Medication: Humira 40mg /0.58ml  Patient is on additional specialty medications: No  Informant: patient  Reliability of informant: reliable          SHIPPING     Shipping address confirmed in Epic.     Delivery Scheduled: Yes, Expected medication delivery date: 07/10/2020.     Medication will be delivered via UPS to the prescription address in Epic WAM.    [name] P [facility] [place] [facility] Specialty Technician

## 2020-07-06 ENCOUNTER — Other Ambulatory Visit: Payer: Self-pay | Admitting: Obstetrics

## 2020-07-06 DIAGNOSIS — N390 Urinary tract infection, site not specified: Secondary | ICD-10-CM

## 2020-07-09 MED FILL — HUMIRA PEN CITRATE FREE 40 MG/0.4 ML: SUBCUTANEOUS | 28 days supply | Qty: 4 | Fill #5

## 2020-07-30 NOTE — Unmapped (Signed)
[facility] Refill Coordination Note    Specialty Medication(s) to be Shipped:   Inflammatory Disorders: Humira    Other medication(s) to be shipped: No additional medications requested for fill at this time     Jasmine Mitchell, DOB: 03/09/1971  Phone: [phone] (home)       All above HIPAA information was verified with patient.     Was a Nurse, learning disability used for this call? No    Completed refill call assessment today to schedule patient's medication shipment from the [facility] [phone]).       Specialty medication(s) and dose(s) confirmed: Regimen is correct and unchanged.   Changes to medications: Wyatt reports no changes at this time.  Changes to insurance: No  Questions for the pharmacist: No    Confirmed patient received Welcome Packet with first shipment. The patient will receive a drug information handout for each medication shipped and additional FDA Medication Guides as required.       DISEASE/MEDICATION-SPECIFIC INFORMATION        For patients on injectable medications: Patient currently has 1 doses left.  Next injection is scheduled for 07/31/20.    SPECIALTY MEDICATION ADHERENCE     Medication Adherence    Patient reported X missed doses in the last month: 0  Specialty Medication: Humria   Patient is on additional specialty medications: No                Humira 40/0.4 mg/ml: 1 days of medicine on hand          SHIPPING     Shipping address confirmed in Epic.     Delivery Scheduled: Yes, Expected medication delivery date: 08/04/20.     Medication will be delivered via Same Day Courier to the prescription address in Epic WAM.    Unk Lightning   [facility] Specialty Technician

## 2020-08-03 MED FILL — HUMIRA PEN CITRATE FREE 40 MG/0.4 ML: SUBCUTANEOUS | 28 days supply | Qty: 4 | Fill #6

## 2020-08-06 NOTE — Unmapped (Signed)
Reason For Call:  Pt called stating her daughter tested positive on sometime late last week  [name]  tested neg for COVID-19 with home test on Monday.      Spoke to pt about virtual visit instead since she lives with her daughter and her exposure. She agrees and pt app with Dr Raphael Gibney on 08/07/20 changed to virtual visit.      Follow Up:  - Patient verbalized understanding and agreement with plan       Workup Time:   10 min

## 2020-08-06 NOTE — Unmapped (Signed)
[facility]   FOLLOWUP NOTE - INFLAMMATORY BOWEL DISEASE  08/07/2020    Demographics:  Jasmine Mitchell is a 50 y.o. year old female    Diagnosis:  Crohn's Disease  Disease onset (yr): 65  Age at onset:   < 66 yr old (A1)  Location:  Ileocolonic (L3) (mostly colonic)  Behavior:  Perianal (P), Stricturing (B2)  Current Tight Control Scenario:   Maintenance = Biologic          HPI / NOTE :     VIDEO VISIT DUE TO COVID19    Interval Events:   1.  Last seen by me 11/2019 - at that time we continued Humira, and restarted desipramine 50mg  at bedtime (ok to increase to 75mg ) for dysepsia and IBS.   2.  Last labs 12/17/2019 - Sed rate was 54, CBC, CMP ok  3.  Had COVID19 infection second time recently.  She has had 2 shots and 1 booster.    4.  I reviewed lab work in Care Everywhere, 04/14/2020 - CBC, CMP, B12 levels all looked. CRP looked ok.  Sed rate was mildly elevated.     HPI:  Doing so-so, feels the irritable bowel is more active. Bowel movements are good - usually twice daily.  Sometimes 3-4x BM per day. Is having more stomach cramps - particularly after eating. About half the time she has good days and half the time she has days with more cramping and discomfort.  Feels she has to rush to the bathroom immediately after eating, particularly if she feels full.  Sugar is a particular trigger, but most foods give her trouble now. She recall dicyclomine did help with this in the past.     Currently taking desipramine 75mg  at bedtime, feels it isn't helping as much as it used to.     Saw Urologist - prescribed prophylactic antibiotic to prevent UTI's.     Abdominal pain (0-10): mild to moderate cramping as above  BM a day: ~4x/day  Consistency: soft  % of stools have blood: 0%  Nocturnal BM: rare  Urgency:  mild  Weight change over last 6 mo: stable  Smoking:  no  NSAIDS: avoids    Review of Systems:   Review of systems positive for: negative except as above.   Otherwise, the balance of 10 systems is negative.          IBD HISTORY:     Brief IBD Disease Course:    - 67 - dx with Crohn's dz ~age 46, sx of diarrhea, abd pain, rectal pain and bloody stools.  Treated with sulfasalazine, Asacol, Pentasa without benefit.  Got courses of steroids and antibiotics on and off.  Tried on but had nausea.   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    - 2004 - Remicade x 9 months. Stopped in 2005 due to 2ndary loss of response.   - 2005 - developed a pyoderma lesion.   - 2010 - Re-established care with Dr. Marcella Dubs.  Concern for internal fistulizing disease.  Was on Cimzia. Had surgery for perianal disease.   - 2011 - Started on Humira.   - 2012 - active perianal disease with a large abscess.  Required EUA, drainage, setons ([place]).   - 2013 - April, dx with pelvic floor dyssynergia and anal sphincter weakness, underwent biofeedback x 2 courses ([place] and [place]).   - 2014 - on - doing well on Humira and long term Cipro.  Having on and  off bowel symptoms symptoms which have been thought to be related to intermittent IBS symptoms.     Endoscopy:      - Colonoscopy 06/17/2003: patch inflammation and ulcers throughout the colon.    - Colonoscopy 01/2009:  Active crohn's at IC valve. Pseudopolyps.  Retraction of IC valve. ?Rectosigmoid fistula tract.  Otherwise normal colon.   - Colonoscopy 10/08/2009:  Perianal fistula, pseudopolyps.  Few aphthae in TI.   - Colonoscopy 07/29/2010:  Poor colon prep, pseudopolyps, stricture at IC valve.  Few ulcers at IC valve. Rectal stricture. Ileal crohn's disease.   - Colonoscopy 05/05/2011:  Pseduopolyps throughout.  4mm cecal polyp. Large fistula in sigmoid colon. Normal TI.   - Colonoscopy 09/06/2012:  Healed scars from prior fistulotomies on perianal exam. Scar in ascending colon. No active Crohn's.   - Colonoscopy 10/24/13:  Pseudopolyps in rectosigmoid, otherwise normal colon and TI.   - Colonoscopy 10/01/14:  Pseudopolyps in rectum, transverse and ascending colon.  Otherwise normal colon.  Granular TI.  Fixed and open IC valve (not stenotic).   - Colonoscopy 08/12/16:  Mild anal canal stenosis on exam, otherwise normal colon and ileum.     PATH = chronic quiescent colitis (left and right colon) no dysplasia  - Colonoscopy 01/28/2019 - mild anal stricture (dilated to 20mm via [place]), otherwise normal colon and ileum with no active inflammation    Imaging:    - CT A/P 10/24/2013: mild colonic wall thickening at splenic/descending colon, no other bowel inflammation or stranding.  *Pachy groundglass opacity in left lung base.     Prior IBD medications (type, dose, duration, response):  x 5-ASAs - Sulfasalazine, Asacol, Pentasa  x Oral corticosteroids - Prednisone  ? Intravenous corticosteroids  x Antibiotics - Flagyl - nausea.  Cipro - tolerates well and it helps.   x Thiopurines - - nausea.    TPMT - normal 07/25/17  x Methotrexate - 2005.   x Anti-TNF therapies - Remicade 2004 - 2005 (~6 doses).  Had gradual loss of response so stopped. Tried on Remicade again in 2009 with some effect, increased to 10mg /kg q4 weeks. Cimzia - started 09/2008.  Humira 2014 - dose increased to 80mg  q2 weeks.  04/2016 - humira level 5.6, Ab negative.   ? Cyclosporine  ? Clinical trial medication  ? Other (Please specify):    Extraintestinal manifestations:   -joint pains affecting: n  -eye: n  -skin: n  -oral ulcers :  n  -blood clots: n  -PSC: n  -other: n          Past Medical History:   Past medical history:   Past Medical History:   Diagnosis Date   ??? Acid reflux    ??? Arthritis    ??? Crohn disease (CMS-HCC)    ??? Fibromyalgia    ??? UTI (urinary tract infection)      Past surgical history:   Past Surgical History:   Procedure Laterality Date   ??? FRACTURE SURGERY      left wrist   ??? GASTROCUTANEOUS FISTULA CLOSURE     ??? ORTHOPEDIC SURGERY     ??? PR COLONOSCOPY FLX DX W/COLLJ SPEC WHEN PFRMD N/A 10/24/2013    Procedure: COLONOSCOPY, FLEXIBLE, PROXIMAL TO SPLENIC FLEXURE; DIAGNOSTIC, W/WO COLLECTION SPECIMEN BY BRUSH OR WASH;  Surgeon: Theadore Nan, MD;  [facility];  Service: Gastroenterology   ??? PR COLONOSCOPY W/BIOPSY SINGLE/MULTIPLE N/A 10/01/2014    Procedure: COLONOSCOPY, FLEXIBLE, PROXIMAL TO SPLENIC FLEXURE; WITH  BIOPSY, SINGLE OR MULTIPLE;  Surgeon: Teodoro Spray, MD;  Location: [facility] PROCEDURES [facility];  Service: Gastroenterology   ??? PR COLONOSCOPY W/BIOPSY SINGLE/MULTIPLE N/A 08/12/2016    Procedure: COLONOSCOPY, FLEXIBLE, PROXIMAL TO SPLENIC FLEXURE; WITH BIOPSY, SINGLE OR MULTIPLE;  Surgeon: Zetta Bills, MD;  Location: [facility];  Service: Gastroenterology   ??? PR COLONOSCOPY W/BIOPSY SINGLE/MULTIPLE Left 01/28/2019    Procedure: COLONOSCOPY, FLEXIBLE, PROXIMAL TO SPLENIC FLEXURE; WITH BIOPSY, SINGLE OR MULTIPLE;  Surgeon: Zetta Bills, MD;  Location: [facility] PROCEDURES [facility];  Service: Gastroenterology   ??? PR COLSC FLX W/RMVL OF TUMOR POLYP LESION SNARE TQ Left 01/28/2019    Procedure: COLONOSCOPY FLEX; W/REMOV TUMOR/LES BY SNARE;  Surgeon: Zetta Bills, MD;  Location: [facility];  Service: Gastroenterology   ??? PR SURG DIAGNOSTIC EXAM, ANORECTAL N/A 12/23/2016    Procedure: ANORECTAL EXAM, SURGICAL, REQUIRING ANESTHESIA (GENERAL, SPINAL, OR EPIDURAL), DIAGNOSTIC;  Surgeon: Mickle Asper, MD;  Location: MAIN OR [place];  Service: Gastrointestinal   ??? PR UPPER GI ENDOSCOPY,DIAGNOSIS N/A 08/12/2016    Procedure: UGI ENDO, INCLUDE ESOPHAGUS, STOMACH, & DUODENUM &/OR JEJUNUM; DX W/WO COLLECTION SPECIMN, BY BRUSH OR WASH;  Surgeon: Zetta Bills, MD;  Location: [facility] PROCEDURES [facility];  Service: Gastroenterology   ??? TUBAL LIGATION       Family history:   Family History   Problem Relation Age of Onset   ??? Cancer Sister    ??? Cancer Paternal Grandmother    ??? Colorectal Cancer Neg Hx      Social history:   Social History     Socioeconomic History   ??? Marital status: Single     Spouse name: Not on file   ??? Number of children: Not on file   ??? Years of education: Not on file   ??? Highest education level: Not on file   Occupational History   ??? Not on file   Tobacco Use   ??? Smoking status: Never Smoker   ??? Smokeless tobacco: Never Used   Substance and Sexual Activity   ??? Alcohol use: Yes     Alcohol/week: 0.0 standard drinks     Comment: socially    ??? Drug use: No   ??? Sexual activity: Not on file   Other Topics Concern   ??? Not on file   Social History Narrative   ??? Not on file     Social Determinants of Health     Financial Resource Strain: Not on file   Food Insecurity: Not on file   Transportation Needs: Not on file   Physical Activity: Not on file   Stress: Not on file   Social Connections: Not on file             Allergies:     Allergies   Allergen Reactions   ??? Oxycodone (Bulk) Nausea And Vomiting     Patient tolerates acetaminophen well             Medications:     Current Outpatient Medications   Medication Sig Dispense Refill   ??? acetaminophen (TYLENOL) 500 MG tablet Take 1,000 mg by mouth every four (4) hours as needed for pain.     ??? buPROPion (WELLBUTRIN XL) 150 MG 24 hr tablet Take 150 mg by mouth daily.     ??? desipramine (NORPRAMIN) 25 MG tablet Take 4 tablets (100 mg total) by mouth nightly. 360 tablet 1   ??? dicyclomine (BENTYL) 10 mg capsule Take 1 capsule (10  mg total) by mouth Three (3) times a day. 270 capsule 2   ??? esomeprazole (NEXIUM) 20 MG capsule Take 1 capsule (20 mg total) by mouth every morning before breakfast. 90 capsule 3   ??? fluticasone (FLONASE) 50 mcg/actuation nasal spray 1 SPRAY BY EACH NARE ROUTE DAILY. AS DIRECTED (Patient taking differently: 1 spray by Each Nare route daily as needed. As directed) 16 mL 2   ??? HUMIRA PEN CITRATE FREE 40 MG/0.4 ML Inject the contents of 1 pen (40 mg total) under the skin once a week. 4 each 6   ??? loratadine (CLARITIN) 10 mg tablet Take 10 mg by mouth once as needed.      ??? MULTIVIT WITH CALCIUM,IRON,MIN (WOMEN'S DAILY MULTIVITAMIN ORAL) Take 1 tablet by mouth once daily.     ??? ondansetron (ZOFRAN-ODT) 8 MG disintegrating tablet Take 8 mg by mouth every eight (8) hours as needed.      ??? sulfamethoxazole-trimethoprim (BACTRIM DS) 800-160 mg per tablet Take 1 tablet by mouth.       No current facility-administered medications for this visit.             Physical Exam:   There were no vitals taken for this visit.    VIDEO VISIT - no vitals and limited physical exam    GEN: middle aged female in no apparent distress, appears comfortable on exam  HEENT: eyes normal and symmetric  NEURO:  Normal speech, face symmetric  PULM:  Respirations comfortable  SKIN:  No visible rash on face/neck  Psych: affect appropriate, A&O x3          Labs, Data & Indices:     Lab Review:   Lab Results   Component Value Date    WBC 5.6 12/17/2019    WBC 6.5 04/29/2016    RBC 4.42 12/17/2019    RBC 4.50 04/29/2016    HGB 13.0 12/17/2019    HGB 13.2 04/29/2016     Lab Results   Component Value Date    AST 18 12/17/2019    AST 13 04/29/2016    ALT 19 12/17/2019    ALT 9 04/29/2016    BUN 11 12/17/2019    BUN 11 04/29/2016    Creatinine 0.66 12/17/2019    Creatinine 0.80 04/29/2016    CO2 27.8 12/17/2019    CO2 21 04/29/2016    Albumin 3.6 12/17/2019    Albumin 4.3 08/19/2014    Calcium 9.7 12/17/2019    Calcium 9.4 04/29/2016     Lab Results   Component Value Date    TSH 2.810 04/29/2016      ...........................................................................................................................................Marland Kitchen   Diagnosis ICD-10-CM Associated Orders   1. Crohn's disease of both small and large intestine without complication (CMS-HCC)  K50.80    2. Irritable bowel syndrome with diarrhea  K58.0 desipramine (NORPRAMIN) 25 MG tablet           Assessment & Recommendations:   Disease state:  Ileocolonic Crohn's with perianal disease and anorectal stricture    Natha Guin is a 50 y.o. female with a long standing hx of stricturing ileocolonic Crohn's disease.  She also has hx of perianal disease (last requiring drainage by Dr. Elenore Rota 2018).  She is currently maintained on weekly Humira monotherapy in clinical and endoscopic remission on this therapy. She has Irritable bowel with diarrhea and pain, which has responded well to desipramine in the past.  She does have ongoing issues with stress and anxiety, which has been partially  relieved by Wellbutrin. She is having partial improvement with Desipramine 75mg  at bedtime.  We will increase to 100mg  daily and also add Bentyl which helped her cramping and pain in the past.     PLAN:  1.  Continue Humira weekly injections  2.  Increase Desipramine to 100mg  daily at bedtime, refill sent  3.  Start Bentyl 1 tablet three times daily.  We can adjust the dosage if needed.   4.  We will discuss updating lab work and a surveillance colonoscopy at next visit.   5.  Follow up with me in 4-6 months.      IBD health maintenance:  Influenza vaccine: 2012, 117/17  Pneumonia vaccine: Prevnar 02/2014; Pneumovax 3//08/2018  COVID Vaccine:  Pfizer April 2021 x 2 doses, 3rd shot 04/2020  Hepatitis B:   TB testing:   Chickenpox/Shingles history:   Bone denistometry:   Derm appointment:  Last small bowel imaging:   Last colonoscopy: 01/2019  PAP smear:   --------------------------------------------  Author: Zetta Bills 08/07/2020 3:55 PM    Zetta Bills, MD  Assistant Professor of Medicine  Division of Gastroenterology & Hepatology  [place] [facility]  =========================================      The patient reports they are currently: at home. I spent 21 minutes on the real-time audio and video visit with the patient on the date of service. I spent an additional 8 minutes on pre- and post-visit activities on the date of service.     The patient was not located and I was located within 250 yards of a hospital based location during the real-time audio and video visit. The patient was physically located in [place] or a state in which I am permitted to provide care. The patient and/or parent/guardian understood that s/he may incur co-pays and cost sharing, and agreed to the telemedicine visit. The visit was reasonable and appropriate under the circumstances given the patient's presentation at the time.    The patient and/or parent/guardian has been advised of the potential risks and limitations of this mode of treatment (including, but not limited to, the absence of in-person examination) and has agreed to be treated using telemedicine. The patient's/patient's family's questions regarding telemedicine have been answered.    If the visit was completed in an ambulatory setting, the patient and/or parent/guardian has also been advised to contact their provider???s office for worsening conditions, and seek emergency medical treatment and/or call 911 if the patient deems either necessary.

## 2020-08-07 ENCOUNTER — Encounter: Admit: 2020-08-07 | Discharge: 2020-08-08 | Payer: PRIVATE HEALTH INSURANCE

## 2020-08-07 DIAGNOSIS — K5080 Crohn's disease of both small and large intestine without complications: Secondary | ICD-10-CM | POA: Diagnosis not present

## 2020-08-07 DIAGNOSIS — K580 Irritable bowel syndrome with diarrhea: Secondary | ICD-10-CM | POA: Diagnosis not present

## 2020-08-07 MED ORDER — DICYCLOMINE 10 MG CAPSULE
ORAL_CAPSULE | Freq: Three times a day (TID) | ORAL | 2 refills | 90.00000 days | Status: CP
Start: 2020-08-07 — End: 2021-02-03

## 2020-08-07 MED ORDER — DESIPRAMINE 25 MG TABLET
ORAL_TABLET | Freq: Every evening | ORAL | 1 refills | 90.00000 days | Status: CP
Start: 2020-08-07 — End: 2021-02-03

## 2020-08-07 NOTE — Unmapped (Signed)
Jasmine Mitchell it was a pleasure seeing you today.  Here is a summary/wrap up from today's visit:     1.  Continue Humira weekly injections  2.  Increase Desipramine to 100mg  daily at bedtime, refill sent  3.  Start Bentyl 1 tablet three times daily.  We can adjust the dosage if needed.   4.  We will discuss updating lab work and a colonoscopy at your next visit.   5.  Follow up with me in 4-6 months.  Please call [phone] to schedule this appointment.     Zetta Bills, MD  Assistant Professor of Medicine  Division of Gastroenterology & Hepatology  [place] [facility]            EXPECTATIONS FOR PATIENT FOLLOW UP AND COMMUNICATION:  -- Follow up Appointments:  Crohn's disease and Ulcerative colitis are serious chronic inflammatory diseases which require close monitoring.  I typically expect to see patients for follow up visits at least every 6 months (or more often if you are having a flare, starting new therapy, etc).  In select cases, patients who are only on aminosalicylates / mesalamine, may be seen once per year for follow ups.      -- Communication:  if you have any questions or concerns, you can communicate with [place] via phone (contact information below) or via myChart.  Note that phone messages are given higher priority and triaged first.  We typically respond to myChart messages within 3 business days (occasionally longer during holidays or vacation times).  For urgent issues, please contact us via phone.      IBD NURSE COORDINATOR CONTACT ??? Neta Mends, RN BSN     Phone: [phone] (direct line)      Fax: [phone]  * For urgent medical concerns after hours or on weekends and holidays, call [phone] and ask to speak to the GI Fellow on call.    * If you have a GI medical question or GI symptoms and would like to speak to your provider's healthcare team, please contact Neta Mends, RN (contact information above) OR you can send the GI healthcare team a message through MyChart at [email or web address]    APPOINTMENT SCHEDULING FOR GI CLINIC AND GI PROCEDURES:  RADIOLOGY - to schedule imaging ordered, please call [phone] opt 1   GI MEDICINE CLINIC  [phone] option 1   GI PROCEDURES         [phone] option 2   *To schedule, reschedule, or cancel your GI appointment, please call [phone]. If you are unable to come to an appointment, please notify us as soon as possible, preferably 24 hours in advance. Doing so may allow other patients with urgent needs to be scheduled in a cancelled appointment slot.     TEST RESULTS   If you have a MyChart account, your new results and a provider message will be sent to you through your MyChart account at [email or web address]. For results that require follow-up, a member of your healthcare team will also contact you directly.    PRESCRIPTION REFILL REQUESTS  To request prescription refills, please contact your pharmacy or send your healthcare team a message through your MyChart account at [email or web address]  RECORD REQUESTS  For questions related to medical records, please call Medical Records Release of Information at [phone]  FINANCIAL COUNSELOR   For billing and other financial questions/needs ??? please contact Mee Hives at [phone]. If you need to leave a message, please be sure to leave  your full name, date of birth or MR#, best call back # and reason for call.    For educational material and resources:  [email or web address]  [place] COVID19 Vaccine Information: [email or web address]  ================================================================

## 2020-08-20 ENCOUNTER — Other Ambulatory Visit: Payer: Self-pay | Admitting: Obstetrics

## 2020-08-20 DIAGNOSIS — Z0000 Encounter for general adult medical examination without abnormal findings: Secondary | ICD-10-CM

## 2020-08-21 DIAGNOSIS — K5080 Crohn's disease of both small and large intestine without complications: Principal | ICD-10-CM

## 2020-08-21 MED ORDER — HUMIRA PEN CITRATE FREE 40 MG/0.4 ML
SUBCUTANEOUS | 6 refills | 28 days | Status: CP
Start: 2020-08-21 — End: 2021-08-21
  Filled 2020-08-27: qty 4, 28d supply, fill #0

## 2020-08-21 NOTE — Unmapped (Signed)
[facility] Refill Coordination Note    Specialty Medication(s) to be Shipped:   Inflammatory Disorders: Humira 40mg /0.51ml  Other medication(s) to be shipped: No additional medications requested for fill at this time     Jasmine Mitchell, DOB: 07-Oct-1970  Phone: [phone] (home)     All above HIPAA information was verified with patient.     Was a Nurse, learning disability used for this call? No    Completed refill call assessment today to schedule patient's medication shipment from the [facility] [phone]).       Specialty medication(s) and dose(s) confirmed: Regimen is correct and unchanged.   Changes to medications: Keasha reports no changes at this time.  Changes to insurance: No  Questions for the pharmacist: No    Confirmed patient received Welcome Packet with first shipment. The patient will receive a drug information handout for each medication shipped and additional FDA Medication Guides as required.       DISEASE/MEDICATION-SPECIFIC INFORMATION        For patients on injectable medications: Patient currently has 1 doses left.  Next injection is scheduled for 08/21/2020.    SPECIALTY MEDICATION ADHERENCE     Medication Adherence    Patient reported X missed doses in the last month: 0  Specialty Medication: Humira 40mg /0.52ml  Patient is on additional specialty medications: No  Patient is on more than two specialty medications: No  Informant: patient  Reliability of informant: reliable  Reasons for non-adherence: no problems identified          SHIPPING     Shipping address confirmed in Epic.     Delivery Scheduled: Yes, Expected medication delivery date: 08/25/2020.     Medication will be delivered via UPS to the prescription address in Epic WAM.    [name] Barbato P [facility] [place] [facility] Specialty Technician

## 2020-08-24 NOTE — Unmapped (Signed)
Jasmine Mitchell 's Humira shipment will be delayed as a result of the medication is too soon to refill until 3/10.     I have reached out to the patient and communicated the delay. We will reschedule the medication for the delivery date that the patient agreed upon.  We have confirmed the delivery date as 3/11, via ups.

## 2020-09-11 ENCOUNTER — Ambulatory Visit: Payer: BC Managed Care – PPO | Admitting: Internal Medicine

## 2020-09-11 ENCOUNTER — Encounter: Payer: Self-pay | Admitting: Internal Medicine

## 2020-09-11 ENCOUNTER — Ambulatory Visit (INDEPENDENT_AMBULATORY_CARE_PROVIDER_SITE_OTHER): Payer: BC Managed Care – PPO

## 2020-09-11 ENCOUNTER — Other Ambulatory Visit: Payer: Self-pay

## 2020-09-11 VITALS — BP 110/68 | HR 68 | Temp 97.9°F | Wt 231.0 lb

## 2020-09-11 DIAGNOSIS — M25532 Pain in left wrist: Secondary | ICD-10-CM

## 2020-09-11 NOTE — Progress Notes (Signed)
Acute office Visit     This visit occurred during the SARS-CoV-2 public health emergency.  Safety protocols were in place, including screening questions prior to the visit, additional usage of staff PPE, and extensive cleaning of exam room while observing appropriate contact time as indicated for disinfecting solutions.    CC/Reason for Visit: Left wrist pain  HPI: Katrina Grant is a 50 y.o. female who is coming in today for the above mentioned reasons.  She broke her wrist in 2003 and had it repaired at that time.  Yesterday she was pulling her pants up by her thumb and she hurt her left wrist pop.  Since then she has had significant pain around the medial part of her wrist radiating down into her pinky finger.  It also appears edematous.  Past Medical/Surgical History: Past Medical History:  Diagnosis Date  . ADD (attention deficit disorder)   . Anxiety   . Cholesterol serum elevated    on medication in the past  . Chronic kidney disease   . Crohn's disease of colon with fistula ([place])    sees Dr. Coralyn Mark, [facility]  . Depression   . Fibromyalgia - managed by Dr. Dossie Der, [facility] - takes Neurontin and Tramadol 11/20/2013  . Frequent headaches   . GERD (gastroesophageal reflux disease)   . Osteoarthritis    knee, spine  . Pneumonia   . Syncope     Past Surgical History:  Procedure Laterality Date  . anal fistula repair      X 4   . colonoscopy with dilation     X 2   . FRACTURE SURGERY    . TUBAL LIGATION      Social History:  reports that she has never smoked. She has never used smokeless tobacco. She reports previous alcohol use. She reports that she does not use drugs.  Allergies: Allergies  Allergen Reactions  . Percocet [Oxycodone-Acetaminophen] Nausea And Vomiting    Family History:  Family History  Problem Relation Age of Onset  . Hypertension Mother   . ADD / ADHD Mother   . Anxiety disorder Mother   . Arthritis Mother   . Depression Mother    . Varicose Veins Mother   . Diabetes Father   . Hearing loss Father   . Heart disease Father   . Cancer Sister        cervical  . ADD / ADHD Sister   . Anxiety disorder Sister   . Arthritis Sister   . Depression Sister   . Early death Sister   . Varicose Veins Sister   . Mental illness Brother        drugs and alcohol abuse  . ADD / ADHD Brother   . Alcohol abuse Brother   . Asthma Brother   . Depression Brother   . Drug abuse Brother   . ADD / ADHD Daughter      Current Outpatient Medications:  .  ACCU-CHEK FASTCLIX LANCETS MISC, Use as directed to check blood sugar once day, Disp: 102 each, Rfl: 0 .  acetaminophen (TYLENOL) 500 MG tablet, Take 1,000 mg by mouth every 6 (six) hours as needed. For migraines and stomach upset, Disp: , Rfl:  .  Adalimumab 40 MG/0.8ML PSKT, Inject 40 mg into the skin once a week., Disp: , Rfl:  .  buPROPion (WELLBUTRIN XL) 150 MG 24 hr tablet, Take by mouth., Disp: , Rfl:  .  buPROPion (WELLBUTRIN XL) 300 MG 24 hr  tablet, TAKE 1 TABLET (300 MG TOTAL) BY MOUTH DAILY AFTER BREAKFAST., Disp: 90 tablet, Rfl: 4 .  desipramine (NORPRAMIN) 25 MG tablet, Take by mouth., Disp: , Rfl:  .  dicyclomine (BENTYL) 10 MG capsule, Take 10 mg by mouth 3 (three) times daily., Disp: , Rfl:  .  fluticasone (FLONASE) 50 MCG/ACT nasal spray, PLACE 1 SPRAY INTO BOTH NOSTRILS DAILY. PATIENT NEEDS AN APPOINTMENT, Disp: 16 mL, Rfl: 5 .  glucose blood (ACCU-CHEK GUIDE) test strip, Use as instructed, Disp: 100 each, Rfl: 1 .  ondansetron (ZOFRAN ODT) 8 MG disintegrating tablet, Take 1 tablet (8 mg total) by mouth every 8 (eight) hours as needed for nausea or vomiting., Disp: 30 tablet, Rfl: 0 .  Prenatal Vit-Fe Fumarate-FA (M-NATAL PLUS) 27-1 MG TABS, TAKE 1 TABLET BY MOUTH EVERY DAY BEFORE BREAKFAST, Disp: 30 tablet, Rfl: 11 .  desipramine (NORPRAMIN) 25 MG tablet, Take by mouth 3 (three) times daily. , Disp: , Rfl:   Review of Systems:  Constitutional: Denies fever,  chills, diaphoresis, appetite change and fatigue.  HEENT: Denies photophobia, eye pain, redness, hearing loss, ear pain, congestion, sore throat, rhinorrhea, sneezing, mouth sores, trouble swallowing, neck pain, neck stiffness and tinnitus.   Respiratory: Denies SOB, DOE, cough, chest tightness,  and wheezing.   Cardiovascular: Denies chest pain, palpitations and leg swelling.  Gastrointestinal: Denies nausea, vomiting, abdominal pain, diarrhea, constipation, blood in stool and abdominal distention.  Genitourinary: Denies dysuria, urgency, frequency, hematuria, flank pain and difficulty urinating.  Endocrine: Denies: hot or cold intolerance, sweats, changes in hair or nails, polyuria, polydipsia. Musculoskeletal: Denies myalgias, back pain and gait problem.  Skin: Denies pallor, rash and wound.  Neurological: Denies dizziness, seizures, syncope, weakness, light-headedness, numbness and headaches.  Hematological: Denies adenopathy. Easy bruising, personal or family bleeding history  Psychiatric/Behavioral: Denies suicidal ideation, mood changes, confusion, nervousness, sleep disturbance and agitation    Physical Exam: Vitals:   09/11/20 0948  BP: 110/68  Pulse: 68  Temp: 97.9 F (36.6 C)  TempSrc: Oral  SpO2: 98%  Weight: 231 lb (104.8 kg)    Body mass index is 39.65 kg/m.   Constitutional: NAD, calm, comfortable Eyes: PERRL, lids and conjunctivae normal ENMT: Mucous membranes are moist.  Musculoskeletal: Pain and edema as well as deformity noted at the medial wrist above the fifth finger. Neurologic: Grossly intact and nonfocal Psychiatric: Normal judgment and insight. Alert and oriented x 3. Normal mood.    Impression and Plan:  Acute pain of left wrist  - Plan: DG Wrist Complete Left.  Further plan to follow but likely Ortho referral.  The orthopedist who did her surgery is now retired. -She has a wrist brace that she will wear for now.    Lelon Frohlich,  MD [facility] at [facility]

## 2020-09-22 ENCOUNTER — Ambulatory Visit: Payer: BC Managed Care – PPO | Admitting: Orthopaedic Surgery

## 2020-09-22 ENCOUNTER — Encounter: Payer: Self-pay | Admitting: Orthopaedic Surgery

## 2020-09-22 DIAGNOSIS — M25532 Pain in left wrist: Secondary | ICD-10-CM | POA: Diagnosis not present

## 2020-09-22 DIAGNOSIS — R200 Anesthesia of skin: Secondary | ICD-10-CM | POA: Diagnosis not present

## 2020-09-22 MED ORDER — PREDNISONE 10 MG (21) PO TBPK: ORAL_TABLET | ORAL | 0 refills | Status: DC

## 2020-09-22 NOTE — Progress Notes (Signed)
Office Visit Note   Patient: Katrina Grant           Date of Birth: 1971-03-03           MRN: [id number] Visit Date: 09/22/2020              Requested by: Katrina Grant, Katrina Halsted, MD [place],  [address] [address] PCP: Katrina Macadam, MD   Assessment & Plan: Visit Diagnoses:  1. Pain in left wrist   2. Bilateral hand numbness     Plan: Impression is left ulnar-sided wrist pain localizing to the ECU tendon possibly a TFCC tear and questionable bilateral carpal tunnel syndrome.  She is not allowed to take NSAIDs due to the Crohn's therefore we will send in prescription for prednisone Dosepak.  She will continue to immobilize with a wrist brace and use Voltaren gel.  I would like to recheck her in about 4weeks for this.  The meantime we will get nerve conduction studies to assess for carpal tunnel syndrome.  Follow-Up Instructions: Return in about 4 weeks (around 10/20/2020).   Orders:  Orders Placed This Encounter  Procedures  . Ambulatory referral to Physical Medicine Rehab   Meds ordered this encounter  Medications  . predniSONE (STERAPRED UNI-PAK 21 TAB) 10 MG (21) TBPK tablet    Sig: Take as directed    Dispense:  21 tablet    Refill:  0      Procedures: No procedures performed   Clinical Data: No additional findings.   Subjective: Chief Complaint  Patient presents with  . Left Wrist - Pain    Katrina Grant is a 50 year old female new patient who comes in for evaluation of left wrist pain started acutely about a week ago when she was pulling up her pants and felt a pop on the ulnar side of the wrist.  She is left-hand dominant.  She had a prior left distal radius fracture that was treated with ORIF.  She feels pain on ulnar side.  Feels like something is dislocating over the ECU tendon.  Denies any numbness and tingling at the wrist.  She does have a separate complaint of bilateral hand and finger numbness that is mild.  She takes Humira for  Crohn's disease.  She has been using it an off-the-shelf left wrist brace which helps with immobilization.   Review of Systems  Constitutional: Negative.   HENT: Negative.   Eyes: Negative.   Respiratory: Negative.   Cardiovascular: Negative.   Endocrine: Negative.   Musculoskeletal: Negative.   Neurological: Negative.   Hematological: Negative.   Psychiatric/Behavioral: Negative.   All other systems reviewed and are negative.    Objective: Vital Signs: There were no vitals taken for this visit.  Physical Exam Vitals and nursing note reviewed.  Constitutional:      Appearance: She is well-developed.  HENT:     Head: Normocephalic and atraumatic.  Pulmonary:     Effort: Pulmonary effort is normal.  Abdominal:     Palpations: Abdomen is soft.  Musculoskeletal:     Cervical back: Neck supple.  Skin:    General: Skin is warm.     Capillary Refill: Capillary refill takes less than 2 seconds.  Neurological:     Mental Status: She is alert and oriented to person, place, and time.  Psychiatric:        Behavior: Behavior normal.        Thought Content: Thought content normal.  Judgment: Judgment normal.     Ortho Exam Left wrist shows significant tenderness of the ECU tendon.  There is no subluxation of the tendon.  Pain with resisted wrist extension.  Ulnar styloid is tender to palpation.  Wrist range of motion is well-preserved.  Bilateral hands show questionable positive carpal tunnel compressive signs. Specialty Comments:  No specialty comments available.  Imaging: No results found.   PMFS History: Patient Active Problem List   Diagnosis Date Noted  . BMI 38.0-38.9,adult 11/24/2016  . Crohn's disease with complication ([place]) 63/84/5364  . Fibromyalgia - managed by Katrina Grant, [facility] - takes Neurontin and Tramadol 11/20/2013  . DUB (dysfunctional uterine bleeding) 03/12/2011   Past Medical History:  Diagnosis Date  . ADD (attention deficit  disorder)   . Anxiety   . Cholesterol serum elevated    on medication in the past  . Chronic kidney disease   . Crohn's disease of colon with fistula ([place])    sees Katrina Grant, [facility]  . Depression   . Fibromyalgia - managed by Katrina Grant, [facility] - takes Neurontin and Tramadol 11/20/2013  . Frequent headaches   . GERD (gastroesophageal reflux disease)   . Osteoarthritis    knee, spine  . Pneumonia   . Syncope     Family History  Problem Relation Age of Onset  . Hypertension Mother   . ADD / ADHD Mother   . Anxiety disorder Mother   . Arthritis Mother   . Depression Mother   . Varicose Veins Mother   . Diabetes Father   . Hearing loss Father   . Heart disease Father   . Cancer Sister        cervical  . ADD / ADHD Sister   . Anxiety disorder Sister   . Arthritis Sister   . Depression Sister   . Early death Sister   . Varicose Veins Sister   . Mental illness Brother        drugs and alcohol abuse  . ADD / ADHD Brother   . Alcohol abuse Brother   . Asthma Brother   . Depression Brother   . Drug abuse Brother   . ADD / ADHD Daughter     Past Surgical History:  Procedure Laterality Date  . anal fistula repair      X 4   . colonoscopy with dilation     X 2   . FRACTURE SURGERY    . TUBAL LIGATION     Social History   Occupational History  . Not on file  Tobacco Use  . Smoking status: Never Smoker  . Smokeless tobacco: Never Used  Substance and Sexual Activity  . Alcohol use: Not Currently    Alcohol/week: 0.0 standard drinks  . Drug use: No  . Sexual activity: Yes    Birth control/protection: Surgical

## 2020-09-25 NOTE — Unmapped (Signed)
[facility] Clinical Assessment & Refill Coordination Note    Jasmine Mitchell, [place]: 11-04-1970  Phone: [phone] (home)     All above HIPAA information was verified with patient.     Was a Nurse, learning disability used for this call? No    Specialty Medication(s):   Inflammatory Disorders: Humira     Current Outpatient Medications   Medication Sig Dispense Refill   ??? esomeprazole (NEXIUM) 20 MG capsule Take 1 capsule (20 mg total) by mouth every morning before breakfast. 90 capsule 3   ??? loratadine (CLARITIN) 10 mg tablet Take 10 mg by mouth once as needed.      ??? MULTIVIT WITH CALCIUM,IRON,MIN (WOMEN'S DAILY MULTIVITAMIN ORAL) Take 1 tablet by mouth once daily.     ??? predniSONE (DELTASONE) 10 mg tablet pack Take by mouth daily. 6 day dose pack for hurt arm.     ??? acetaminophen (TYLENOL) 500 MG tablet Take 1,000 mg by mouth every four (4) hours as needed for pain.     ??? buPROPion (WELLBUTRIN XL) 150 MG 24 hr tablet Take 150 mg by mouth daily.     ??? desipramine (NORPRAMIN) 25 MG tablet Take 4 tablets (100 mg total) by mouth nightly. 360 tablet 1   ??? dicyclomine (BENTYL) 10 mg capsule Take 1 capsule (10 mg total) by mouth Three (3) times a day. 270 capsule 2   ??? fluticasone (FLONASE) 50 mcg/actuation nasal spray 1 SPRAY BY EACH NARE ROUTE DAILY. AS DIRECTED (Patient taking differently: 1 spray by Each Nare route daily as needed. As directed) 16 mL 2   ??? HUMIRA PEN CITRATE FREE 40 MG/0.4 ML Inject the contents of 1 pen (40 mg total) under the skin once a week. 4 each 6   ??? ondansetron (ZOFRAN-ODT) 8 MG disintegrating tablet Take 8 mg by mouth every eight (8) hours as needed.      ??? sulfamethoxazole-trimethoprim (BACTRIM DS) 800-160 mg per tablet Take 1 tablet by mouth. (Patient not taking: Reported on 09/25/2020)       No current facility-administered medications for this visit.        Changes to medications: [place] reports no changes at this time.    Allergies   Allergen Reactions   ??? Oxycodone (Bulk) Nausea And Vomiting     Patient tolerates acetaminophen well       Changes to allergies: No    SPECIALTY MEDICATION ADHERENCE     Humira 40/0.4 mg/ml: 0 days of medicine on hand       Medication Adherence    Patient reported X missed doses in the last month: 0  Specialty Medication: Humira 40  Patient is on additional specialty medications: No  Informant: patient          Specialty medication(s) dose(s) confirmed: Regimen is correct and unchanged.     Are there any concerns with adherence? No    Adherence counseling provided? Not needed    CLINICAL MANAGEMENT AND INTERVENTION      Clinical Benefit Assessment:    Do you feel the medicine is effective or helping your condition? Yes    Clinical Benefit counseling provided? Progress note from 2/18 shows evidence of clinical benefit    Adverse Effects Assessment:    Are you experiencing any side effects? No    Are you experiencing difficulty administering your medicine? No    Quality of Life Assessment:    How many days over the past month did your Crohn's  keep you  from your normal activities? For example, brushing your teeth or getting up in the morning. 0    Have you discussed this with your provider? Not needed    Acute Infection Status:    Acute infections noted within Epic:  No active infections  Patient reported infection: None    Therapy Appropriateness:    Is therapy appropriate? Yes, therapy is appropriate and should be continued    DISEASE/MEDICATION-SPECIFIC INFORMATION      For patients on injectable medications: Patient currently has 1 doses left.  Next injection is scheduled for 4/8.    PATIENT SPECIFIC NEEDS     - Does the patient have any physical, cognitive, or cultural barriers? No    - Is the patient high risk? No    - Does the patient require a Care Management Plan? No     - Does the patient require physician intervention or other additional services (i.e. nutrition, smoking cessation, social work)? No      SHIPPING     Specialty Medication(s) to be Shipped:   Inflammatory Disorders: Humira    Other medication(s) to be shipped: No additional medications requested for fill at this time     Changes to insurance: No    Delivery Scheduled: Yes, Expected medication delivery date: 4/12.     Medication will be delivered via UPS to the confirmed prescription address in [facility].    The patient will receive a drug information handout for each medication shipped and additional FDA Medication Guides as required.  Verified that patient has previously received a Conservation officer, historic buildings and a Surveyor, mining.    All of the patient's questions and concerns have been addressed.    Julianne Rice   [facility] Specialty Pharmacist

## 2020-09-28 MED FILL — HUMIRA PEN CITRATE FREE 40 MG/0.4 ML: SUBCUTANEOUS | 28 days supply | Qty: 4 | Fill #1

## 2020-10-05 ENCOUNTER — Telehealth: Payer: Self-pay | Admitting: Physical Medicine and Rehabilitation

## 2020-10-05 NOTE — Telephone Encounter (Signed)
Called pt and LVM #1

## 2020-10-05 NOTE — Telephone Encounter (Signed)
Patient called requesting a called back about an earlier appt. Pease call patient at [phone].

## 2020-10-06 NOTE — Telephone Encounter (Signed)
Pt called back and r/s

## 2020-10-08 DIAGNOSIS — Z1152 Encounter for screening for COVID-19: Secondary | ICD-10-CM | POA: Diagnosis not present

## 2020-10-13 ENCOUNTER — Telehealth: Payer: Self-pay

## 2020-10-13 ENCOUNTER — Ambulatory Visit: Payer: BC Managed Care – PPO | Admitting: Orthopaedic Surgery

## 2020-10-13 ENCOUNTER — Encounter: Payer: Self-pay | Admitting: Orthopaedic Surgery

## 2020-10-13 ENCOUNTER — Other Ambulatory Visit: Payer: Self-pay

## 2020-10-13 DIAGNOSIS — M25532 Pain in left wrist: Secondary | ICD-10-CM | POA: Diagnosis not present

## 2020-10-13 MED ORDER — TRAMADOL HCL 50 MG PO TABS: 50.0000 mg | ORAL_TABLET | Freq: Three times a day (TID) | ORAL | 1 refills | Status: DC | PRN

## 2020-10-13 NOTE — Telephone Encounter (Signed)
PA Done through [email or web address]   Anahlia Buss Key: [id number] - Rx #: [id number]   Pending auth.

## 2020-10-13 NOTE — Progress Notes (Signed)
Office Visit Note   Patient: Katrina Grant           Date of Birth: 06/09/1971           MRN: [id number] Visit Date: 10/13/2020              Requested by: Caren Macadam, MD [place],  [place] [address] PCP: Caren Macadam, MD   Assessment & Plan: Visit Diagnoses:  1. Pain in left wrist     Plan: Impression is left wrist TFCC tear versus ECU pathology.  We have discussed cortisone injection versus MR arthrogram.  Patient would like to proceed with MR arthrogram of the left wrist first.  She also has an upcoming nerve conduction study for which she will see [place] for after it has been completed.  Follow-Up Instructions: Return for after MR arthrogram left wrist and ncs/emg.   Orders:  No orders of the defined types were placed in this encounter.  Meds ordered this encounter  Medications  . traMADol (ULTRAM) 50 MG tablet    Sig: Take 1 tablet (50 mg total) by mouth 3 (three) times daily as needed.    Dispense:  60 tablet    Refill:  1      Procedures: No procedures performed   Clinical Data: No additional findings.   Subjective: Chief Complaint  Patient presents with  . Left Wrist - Pain    HPI a pleasant 50 year old female who comes in today for follow-up of her left wrist.  Approximately 4 weeks ago she was pulling up her pants when she felt a pop and pain to the ulnar side of her wrist.  She was seen by [place] where she had significant tenderness to the TFCC as well as the ECU tendon.  She was placed in a removable wrist splint and provided with a steroid taper.  She notes that her symptoms mildly improved while on the medication.  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  Review of Systems as detailed in HPI.  All others reviewed and are negative.   Objective: Vital Signs: There were no vitals taken for this visit.  Physical Exam well-developed and well-nourished female in no  acute distress.  Alert and oriented x3.  Ortho Exam examination of the left wrist reveals mild swelling to the dorsum of the forearm.  She has diffuse tenderness throughout the ECU tendon and increased pain with resisted extension of the fingers.  She has marked tenderness along the TFCC and increased pain with ulnar deviation.  She is neurovascular intact distally.  Specialty Comments:  No specialty comments available.  Imaging: No new imaging   PMFS History: Patient Active Problem List   Diagnosis Date Noted  . BMI 38.0-38.9,adult 11/24/2016  . Crohn's disease with complication ([place]) 16/55/3748  . Fibromyalgia - managed by Dr. Dossie Der, [facility] - takes Neurontin and Tramadol 11/20/2013  . DUB (dysfunctional uterine bleeding) 03/12/2011   Past Medical History:  Diagnosis Date  . ADD (attention deficit disorder)   . Anxiety   . Cholesterol serum elevated    on medication in the past  . Chronic kidney disease   . Crohn's disease of colon with fistula ([place])    sees Dr. Coralyn Mark, [facility]  . Depression   . Fibromyalgia - managed by Dr. Dossie Der, [facility] - takes Neurontin and Tramadol 11/20/2013  . Frequent headaches   . GERD (gastroesophageal  reflux disease)   . Osteoarthritis    knee, spine  . Pneumonia   . Syncope     Family History  Problem Relation Age of Onset  . Hypertension Mother   . ADD / ADHD Mother   . Anxiety disorder Mother   . Arthritis Mother   . Depression Mother   . Varicose Veins Mother   . Diabetes Father   . Hearing loss Father   . Heart disease Father   . Cancer Sister        cervical  . ADD / ADHD Sister   . Anxiety disorder Sister   . Arthritis Sister   . Depression Sister   . Early death Sister   . Varicose Veins Sister   . Mental illness Brother        drugs and alcohol abuse  . ADD / ADHD Brother   . Alcohol abuse Brother   . Asthma Brother   . Depression Brother   . Drug abuse Brother   . ADD / ADHD Daughter     Past  Surgical History:  Procedure Laterality Date  . anal fistula repair      X 4   . colonoscopy with dilation     X 2   . FRACTURE SURGERY    . TUBAL LIGATION     Social History   Occupational History  . Not on file  Tobacco Use  . Smoking status: Never Smoker  . Smokeless tobacco: Never Used  Substance and Sexual Activity  . Alcohol use: Not Currently    Alcohol/week: 0.0 standard drinks  . Drug use: No  . Sexual activity: Yes    Birth control/protection: Surgical

## 2020-10-15 NOTE — Telephone Encounter (Signed)
Tramadol denied anything else you can send?

## 2020-10-16 ENCOUNTER — Other Ambulatory Visit: Payer: Self-pay | Admitting: Physician Assistant

## 2020-10-16 MED ORDER — ONDANSETRON HCL 4 MG PO TABS: 4.0000 mg | ORAL_TABLET | Freq: Three times a day (TID) | ORAL | 0 refills | Status: DC | PRN

## 2020-10-16 MED ORDER — ACETAMINOPHEN-CODEINE #3 300-30 MG PO TABS: 1.0000 | ORAL_TABLET | Freq: Three times a day (TID) | ORAL | 0 refills | Status: DC | PRN

## 2020-10-16 NOTE — Telephone Encounter (Signed)
Can you please send into pharm.

## 2020-10-16 NOTE — Telephone Encounter (Signed)
Tylenol 3

## 2020-10-16 NOTE — Telephone Encounter (Signed)
Sent in

## 2020-10-16 NOTE — Telephone Encounter (Signed)
Patient aware.

## 2020-10-20 NOTE — Unmapped (Signed)
[facility] Refill Coordination Note    Specialty Medication(s) to be Shipped:   Inflammatory Disorders: Humira 40mg /0.76ml  Other medication(s) to be shipped: No additional medications requested for fill at this time     Jasmine Mitchell, DOB: [birth year]  Phone: [phone] (home)     All above HIPAA information was verified with patient.     Was a Nurse, learning disability used for this call? No    Completed refill call assessment today to schedule patient's medication shipment from the [facility] [phone]).  All relevant notes have been reviewed.     Specialty medication(s) and dose(s) confirmed: Regimen is correct and unchanged.   Changes to medications: [place] reports no changes at this time.  Changes to insurance: No  New side effects reported not previously addressed with a pharmacist or physician: None reported  Questions for the pharmacist: No    Confirmed patient received a Conservation officer, historic buildings and a Surveyor, mining with first shipment. The patient will receive a drug information handout for each medication shipped and additional FDA Medication Guides as required.       DISEASE/MEDICATION-SPECIFIC INFORMATION        For patients on injectable medications: Patient currently has 1 doses left.  Next injection is scheduled for 10/23/2020.    SPECIALTY MEDICATION ADHERENCE     Medication Adherence    Patient reported X missed doses in the last month: 0  Specialty Medication: Humira 40mg /0.57ml  Patient is on additional specialty medications: No  Patient is on more than two specialty medications: No  Informant: patient  Reliability of informant: reliable  Reasons for non-adherence: no problems identified        Were doses missed due to medication being on hold? No    REFERRAL TO PHARMACIST     Referral to the pharmacist: Not needed    [facility]     Shipping address confirmed in Epic.     Delivery Scheduled: Yes, Expected medication delivery date: 10/23/2020.     Medication will be delivered via UPS to the prescription address in Epic [facility].    Jasmine Mitchell Shared [facility] Specialty Technician

## 2020-10-22 ENCOUNTER — Other Ambulatory Visit: Payer: Self-pay

## 2020-10-22 ENCOUNTER — Ambulatory Visit
Admission: RE | Admit: 2020-10-22 | Discharge: 2020-10-22 | Disposition: A | Discharge status: Home / Self Care | Payer: BC Managed Care – PPO | Source: Ambulatory Visit | Attending: Orthopaedic Surgery | Admitting: Orthopaedic Surgery

## 2020-10-22 DIAGNOSIS — M25532 Pain in left wrist: Secondary | ICD-10-CM | POA: Diagnosis not present

## 2020-10-22 DIAGNOSIS — S56512A Strain of other extensor muscle, fascia and tendon at forearm level, left arm, initial encounter: Secondary | ICD-10-CM | POA: Diagnosis not present

## 2020-10-22 DIAGNOSIS — Z1152 Encounter for screening for COVID-19: Secondary | ICD-10-CM | POA: Diagnosis not present

## 2020-10-22 MED ORDER — IOPAMIDOL (ISOVUE-M 200) INJECTION 41%
2.0000 mL | Freq: Once | INTRAMUSCULAR | Status: AC
  Administered 2020-10-22: 2 mL via INTRA_ARTICULAR

## 2020-10-22 MED FILL — HUMIRA PEN CITRATE FREE 40 MG/0.4 ML: SUBCUTANEOUS | 28 days supply | Qty: 4 | Fill #2

## 2020-10-25 NOTE — Progress Notes (Signed)
Needs appt

## 2020-10-26 ENCOUNTER — Telehealth: Payer: Self-pay

## 2020-10-26 NOTE — Telephone Encounter (Signed)
Called patient no answer LMOM.  Needs appt to Review MRI & to review NCS/EMG. She can come in  5/19 or any other day after that.

## 2020-11-03 ENCOUNTER — Ambulatory Visit: Payer: BC Managed Care – PPO | Admitting: Physical Medicine and Rehabilitation

## 2020-11-03 ENCOUNTER — Other Ambulatory Visit: Payer: Self-pay

## 2020-11-03 DIAGNOSIS — R202 Paresthesia of skin: Secondary | ICD-10-CM | POA: Diagnosis not present

## 2020-11-03 NOTE — Progress Notes (Signed)
Wrist pain with pain into fourth and fifth fingers on left hand. Starting to have pain in palm of right hand. History of left wrist fracture in 2003. Left hand dominant No lotion per patient Numeric Pain Rating Scale and Functional Assessment Average Pain 9   In the last MONTH (on 0-10 scale) has pain interfered with the following?  1. General activity like being  able to carry out your everyday physical activities such as walking, climbing stairs, carrying groceries, or moving a chair?  Rating(8)

## 2020-11-04 NOTE — Procedures (Signed)
EMG & NCV Findings: Evaluation of the left median (across palm) sensory nerve showed prolonged distal peak latency (Wrist, 3.7 ms) and prolonged distal peak latency (Palm, 2.1 ms).  The right median (across palm) sensory nerve showed prolonged distal peak latency (Wrist, 3.8 ms).  All remaining nerves (as indicated in the following tables) were within normal limits.  All left vs. right side differences were within normal limits.    All examined muscles (as indicated in the following table) showed no evidence of electrical instability.    Impression: The above electrodiagnostic study is ABNORMAL and reveals evidence of a mild bilateral median nerve entrapment at the wrist affecting sensory components.   There is no significant electrodiagnostic evidence of any other focal nerve entrapment, brachial plexopathy or cervical radiculopathy.   Recommendations: 1.  Follow-up with referring physician. 2.  Continue current management of symptoms.  ___________________________ Laurence Spates FAAPMR Board Certified, American Board of Physical Medicine and Rehabilitation    Nerve Conduction Studies Anti Sensory Summary Table   Stim Site NR Peak (ms) Norm Peak (ms) P-T Amp (V) Norm P-T Amp Site1 Site2 Delta-P (ms) Dist (cm) Vel (m/s) Norm Vel (m/s)  Left Median Acr Palm Anti Sensory (2nd Digit)  31C  Wrist    *3.7 <3.6 59.8 >10 Wrist Palm 1.6 0.0    Palm    *2.1 <2.0 76.9         Right Median Acr Palm Anti Sensory (2nd Digit)  30.1C  Wrist    *3.8 <3.6 43.4 >10 Wrist Palm 1.9 0.0    Palm    1.9 <2.0 39.3         Left Radial Anti Sensory (Base 1st Digit)  31.5C  Wrist    2.1 <3.1 29.3  Wrist Base 1st Digit 2.1 0.0    Left Ulnar Anti Sensory (5th Digit)  31C  Wrist    3.5 <3.7 38.9 >15.0 Wrist 5th Digit 3.5 14.0 40 >38   Motor Summary Table   Stim Site NR Onset (ms) Norm Onset (ms) O-P Amp (mV) Norm O-P Amp Site1 Site2 Delta-0 (ms) Dist (cm) Vel (m/s) Norm Vel (m/s)  Left Median Motor (Abd  Poll Brev)  31.3C  Wrist    3.4 <4.2 9.2 >5 Elbow Wrist 3.9 21.0 54 >50  Elbow    7.3  8.9         Right Median Motor (Abd Poll Brev)  30.2C  Wrist    3.8 <4.2 8.3 >5 Elbow Wrist 3.9 22.5 58 >50  Elbow    7.7  8.2         Left Ulnar Motor (Abd Dig Min)  31.1C  Wrist    2.6 <4.2 8.7 >3 B Elbow Wrist 3.3 19.5 59 >53  B Elbow    5.9  8.1  A Elbow B Elbow 1.1 9.0 82 >53  A Elbow    7.0  9.3          EMG   Side Muscle Nerve Root Ins Act Fibs Psw Amp Dur Poly Recrt Int [name] Comment  Left Abd Poll Brev Median C8-T1 Nml Nml Nml Nml Nml 0 Nml Nml   Left 1stDorInt Ulnar C8-T1 Nml Nml Nml Nml Nml 0 Nml Nml   Left PronatorTeres Median C6-7 Nml Nml Nml Nml Nml 0 Nml Nml   Left Biceps Musculocut C5-6 Nml Nml Nml Nml Nml 0 Nml Nml   Left Deltoid Axillary C5-6 Nml Nml Nml Nml Nml 0 Nml Nml     Nerve  Conduction Studies Anti Sensory Left/Right Comparison   Stim Site L Lat (ms) R Lat (ms) L-R Lat (ms) L Amp (V) R Amp (V) L-R Amp (%) Site1 Site2 L Vel (m/s) R Vel (m/s) L-R Vel (m/s)  Median Acr Palm Anti Sensory (2nd Digit)  31C  Wrist *3.7 *3.8 0.1 59.8 43.4 27.4 Wrist Palm     Palm *2.1 1.9 0.2 76.9 39.3 48.9       Radial Anti Sensory (Base 1st Digit)  31.5C  Wrist 2.1   29.3   Wrist Base 1st Digit     Ulnar Anti Sensory (5th Digit)  31C  Wrist 3.5   38.9   Wrist 5th Digit 40     Motor Left/Right Comparison   Stim Site L Lat (ms) R Lat (ms) L-R Lat (ms) L Amp (mV) R Amp (mV) L-R Amp (%) Site1 Site2 L Vel (m/s) R Vel (m/s) L-R Vel (m/s)  Median Motor (Abd Poll Brev)  31.3C  Wrist 3.4 3.8 0.4 9.2 8.3 9.8 Elbow Wrist 54 58 4  Elbow 7.3 7.7 0.4 8.9 8.2 7.9       Ulnar Motor (Abd Dig Min)  31.1C  Wrist 2.6   8.7   B Elbow Wrist 59    B Elbow 5.9   8.1   A Elbow B Elbow 82    A Elbow 7.0   9.3            Waveforms:

## 2020-11-04 NOTE — Progress Notes (Signed)
Katrina Grant - 50 y.o. female MRN [id number]  Date of birth: 20-Nov-1970  Office Visit Note: Visit Date: 11/03/2020 PCP: Caren Macadam, MD Referred by: Caren Macadam, MD  Subjective: Chief Complaint  Patient presents with  . Left Wrist - Pain  . Right Hand - Pain   HPI:  Katrina Grant is a 50 y.o. female who comes in today at the request of Dr. Eduard Roux for electrodiagnostic study of the Bilateral upper extremities.  Patient is Left hand dominant.  She reports pain numbness and tingling in both hands but in particular had more recent trouble of the left wrist on the ulnar side after hearing a pop.  She continues to have more symptoms of pain in the fourth and fifth digits on the left hand but some numbness and tingling globally.  Right hand more pain in the palm and global tingling and numbness.  No history of prior electrodiagnostic study.  Her case is complicated by history of fibromyalgia.  She rates her pain as 9 out of 10.   ROS Otherwise per HPI.  Assessment & Plan: Visit Diagnoses:    ICD-10-CM   1. Paresthesia of skin  R20.2 NCV with EMG (electromyography)    Plan: Impression: The above electrodiagnostic study is ABNORMAL and reveals evidence of a mild bilateral median nerve entrapment at the wrist affecting sensory components.   There is no significant electrodiagnostic evidence of any other focal nerve entrapment, brachial plexopathy or cervical radiculopathy.   Recommendations: 1.  Follow-up with referring physician. 2.  Continue current management of symptoms.  Meds & Orders: No orders of the defined types were placed in this encounter.   Orders Placed This Encounter  Procedures  . NCV with EMG (electromyography)    Follow-up: Return in about 2 weeks (around 11/17/2020) for  Eduard Roux, MD.   Procedures: No procedures performed  EMG & NCV Findings: Evaluation of the left median (across palm) sensory nerve showed prolonged distal peak latency  (Wrist, 3.7 ms) and prolonged distal peak latency (Palm, 2.1 ms).  The right median (across palm) sensory nerve showed prolonged distal peak latency (Wrist, 3.8 ms).  All remaining nerves (as indicated in the following tables) were within normal limits.  All left vs. right side differences were within normal limits.    All examined muscles (as indicated in the following table) showed no evidence of electrical instability.    Impression: The above electrodiagnostic study is ABNORMAL and reveals evidence of a mild bilateral median nerve entrapment at the wrist affecting sensory components.   There is no significant electrodiagnostic evidence of any other focal nerve entrapment, brachial plexopathy or cervical radiculopathy.   Recommendations: 1.  Follow-up with referring physician. 2.  Continue current management of symptoms.  ___________________________ Laurence Spates FAAPMR Board Certified, American Board of Physical Medicine and Rehabilitation    Nerve Conduction Studies Anti Sensory Summary Table   Stim Site NR Peak (ms) Norm Peak (ms) P-T Amp (V) Norm P-T Amp Site1 Site2 Delta-P (ms) Dist (cm) Vel (m/s) Norm Vel (m/s)  Left Median Acr Palm Anti Sensory (2nd Digit)  31C  Wrist    *3.7 <3.6 59.8 >10 Wrist Palm 1.6 0.0    Palm    *2.1 <2.0 76.9         Right Median Acr Palm Anti Sensory (2nd Digit)  30.1C  Wrist    *3.8 <3.6 43.4 >10 Wrist Palm 1.9 0.0    Palm    1.9 <2.0  39.3         Left Radial Anti Sensory (Base 1st Digit)  31.5C  Wrist    2.1 <3.1 29.3  Wrist Base 1st Digit 2.1 0.0    Left Ulnar Anti Sensory (5th Digit)  31C  Wrist    3.5 <3.7 38.9 >15.0 Wrist 5th Digit 3.5 14.0 40 >38   Motor Summary Table   Stim Site NR Onset (ms) Norm Onset (ms) O-P Amp (mV) Norm O-P Amp Site1 Site2 Delta-0 (ms) Dist (cm) Vel (m/s) Norm Vel (m/s)  Left Median Motor (Abd Poll Brev)  31.3C  Wrist    3.4 <4.2 9.2 >5 Elbow Wrist 3.9 21.0 54 >50  Elbow    7.3  8.9         Right Median  Motor (Abd Poll Brev)  30.2C  Wrist    3.8 <4.2 8.3 >5 Elbow Wrist 3.9 22.5 58 >50  Elbow    7.7  8.2         Left Ulnar Motor (Abd Dig Min)  31.1C  Wrist    2.6 <4.2 8.7 >3 B Elbow Wrist 3.3 19.5 59 >53  B Elbow    5.9  8.1  A Elbow B Elbow 1.1 9.0 82 >53  A Elbow    7.0  9.3          EMG   Side Muscle Nerve Root Ins Act Fibs Psw Amp Dur Poly Recrt Int [name] Comment  Left Abd Poll Brev Median C8-T1 Nml Nml Nml Nml Nml 0 Nml Nml   Left 1stDorInt Ulnar C8-T1 Nml Nml Nml Nml Nml 0 Nml Nml   Left PronatorTeres Median C6-7 Nml Nml Nml Nml Nml 0 Nml Nml   Left Biceps Musculocut C5-6 Nml Nml Nml Nml Nml 0 Nml Nml   Left Deltoid Axillary C5-6 Nml Nml Nml Nml Nml 0 Nml Nml     Nerve Conduction Studies Anti Sensory Left/Right Comparison   Stim Site L Lat (ms) R Lat (ms) L-R Lat (ms) L Amp (V) R Amp (V) L-R Amp (%) Site1 Site2 L Vel (m/s) R Vel (m/s) L-R Vel (m/s)  Median Acr Palm Anti Sensory (2nd Digit)  31C  Wrist *3.7 *3.8 0.1 59.8 43.4 27.4 Wrist Palm     Palm *2.1 1.9 0.2 76.9 39.3 48.9       Radial Anti Sensory (Base 1st Digit)  31.5C  Wrist 2.1   29.3   Wrist Base 1st Digit     Ulnar Anti Sensory (5th Digit)  31C  Wrist 3.5   38.9   Wrist 5th Digit 40     Motor Left/Right Comparison   Stim Site L Lat (ms) R Lat (ms) L-R Lat (ms) L Amp (mV) R Amp (mV) L-R Amp (%) Site1 Site2 L Vel (m/s) R Vel (m/s) L-R Vel (m/s)  Median Motor (Abd Poll Brev)  31.3C  Wrist 3.4 3.8 0.4 9.2 8.3 9.8 Elbow Wrist 54 58 4  Elbow 7.3 7.7 0.4 8.9 8.2 7.9       Ulnar Motor (Abd Dig Min)  31.1C  Wrist 2.6   8.7   B Elbow Wrist 59    B Elbow 5.9   8.1   A Elbow B Elbow 82    A Elbow 7.0   9.3            Waveforms:                 Clinical History: No specialty comments available.  Objective:  VS:  HT:    WT:   BMI:     BP:   HR: bpm  TEMP: ( )  RESP:  Physical Exam Musculoskeletal:        General: No swelling, tenderness or deformity.     Comments: Inspection reveals no  atrophy of the bilateral APB or FDI or hand intrinsics. There is no swelling, color changes, allodynia or dystrophic changes. There is 5 out of 5 strength in the bilateral wrist extension, finger abduction and long finger flexion. There is intact sensation to light touch in all dermatomal and peripheral nerve distributions. There is a negative Phalen's test bilaterally. There is a negative Hoffmann's test bilaterally.  Skin:    General: Skin is warm and dry.     Findings: No erythema or rash.  Neurological:     General: No focal deficit present.     Mental Status: She is alert and oriented to person, place, and time.     Motor: No weakness or abnormal muscle tone.     Coordination: Coordination normal.  Psychiatric:        Mood and Affect: Mood normal.        Behavior: Behavior normal.      Imaging: No results found.

## 2020-11-06 ENCOUNTER — Ambulatory Visit: Payer: BC Managed Care – PPO | Admitting: Orthopaedic Surgery

## 2020-11-06 ENCOUNTER — Telehealth: Payer: Self-pay | Admitting: Orthopaedic Surgery

## 2020-11-06 ENCOUNTER — Encounter: Payer: Self-pay | Admitting: Orthopaedic Surgery

## 2020-11-06 ENCOUNTER — Other Ambulatory Visit: Payer: Self-pay

## 2020-11-06 DIAGNOSIS — G5601 Carpal tunnel syndrome, right upper limb: Secondary | ICD-10-CM | POA: Diagnosis not present

## 2020-11-06 DIAGNOSIS — M25532 Pain in left wrist: Secondary | ICD-10-CM | POA: Diagnosis not present

## 2020-11-06 DIAGNOSIS — G5602 Carpal tunnel syndrome, left upper limb: Secondary | ICD-10-CM

## 2020-11-06 NOTE — Progress Notes (Signed)
Office Visit Note   Patient: Katrina Grant           Date of Birth: [birth year]           MRN: [id number] Visit Date: 11/06/2020              Requested by: Caren Macadam, MD [place],  [place] [id number] PCP: Caren Macadam, MD   Assessment & Plan: Visit Diagnoses:  1. Pain in left wrist   2. Right carpal tunnel syndrome   3. Left carpal tunnel syndrome     Plan: Nerve conduction study showed bilateral mild carpal tunnel syndrome.  MRI shows a complete tear of the ECU tendon with about a half a centimeter half of retraction.  These findings were reviewed with Verdis Frederickson and recommendation is for referral to a hand surgeon mainly for ECU tear.  Questions encouraged and answered.  Follow-Up Instructions: No follow-ups on file.   Orders:  No orders of the defined types were placed in this encounter.  No orders of the defined types were placed in this encounter.     Procedures: No procedures performed   Clinical Data: No additional findings.   Subjective: Chief Complaint  Patient presents with  . Left Wrist - Pain    [name] returns today for nerve conduction studies and a recent MRI review.  Denies any changes in symptoms.   Review of Systems   Objective: Vital Signs: LMP 10/08/2020 (Approximate)   Physical Exam  Ortho Exam  Exams are unchanged.  Continues to have tenderness along the ECU tendon and with wrist extension. Specialty Comments:  No specialty comments available.  Imaging: No results found.   PMFS History: Patient Active Problem List   Diagnosis Date Noted  . BMI 38.0-38.9,adult 11/24/2016  . Crohn's disease with complication ([place]) 13/24/4010  . Fibromyalgia - managed by Dr. Dossie Der, [facility] - takes Neurontin and Tramadol 11/20/2013  . DUB (dysfunctional uterine bleeding) 03/12/2011   Past Medical History:  Diagnosis Date  . ADD (attention deficit disorder)   . Anxiety   . Cholesterol serum elevated     on medication in the past  . Chronic kidney disease   . Crohn's disease of colon with fistula ([place])    sees Dr. Coralyn Mark, [facility]  . Depression   . Fibromyalgia - managed by Dr. Dossie Der, [facility] - takes Neurontin and Tramadol 11/20/2013  . Frequent headaches   . GERD (gastroesophageal reflux disease)   . Osteoarthritis    knee, spine  . Pneumonia   . Syncope     Family History  Problem Relation Age of Onset  . Hypertension Mother   . ADD / ADHD Mother   . Anxiety disorder Mother   . Arthritis Mother   . Depression Mother   . Varicose Veins Mother   . Diabetes Father   . Hearing loss Father   . Heart disease Father   . Cancer Sister        cervical  . ADD / ADHD Sister   . Anxiety disorder Sister   . Arthritis Sister   . Depression Sister   . Early death Sister   . Varicose Veins Sister   . Mental illness Brother        drugs and alcohol abuse  . ADD / ADHD Brother   . Alcohol abuse Brother   . Asthma Brother   . Depression Brother   . Drug abuse Brother   . ADD /  ADHD Daughter     Past Surgical History:  Procedure Laterality Date  . anal fistula repair      X 4   . colonoscopy with dilation     X 2   . FRACTURE SURGERY    . TUBAL LIGATION     Social History   Occupational History  . Not on file  Tobacco Use  . Smoking status: Never Smoker  . Smokeless tobacco: Never Used  Substance and Sexual Activity  . Alcohol use: Not Currently    Alcohol/week: 0.0 standard drinks  . Drug use: No  . Sexual activity: Yes    Birth control/protection: Surgical

## 2020-11-06 NOTE — Telephone Encounter (Signed)
Sent her a Comptroller

## 2020-11-06 NOTE — Telephone Encounter (Signed)
Pt called and stated at her appt this morning she was advised that Dr. Erlinda Hong will be referring her to another doctor by this afternoon and wanted to know if she could have the contact information and a call back as soon as it was available since she is in pain and wants to get that appt made asap. The best number to reach her at is [phone].

## 2020-11-07 DIAGNOSIS — Z1152 Encounter for screening for COVID-19: Secondary | ICD-10-CM | POA: Diagnosis not present

## 2020-11-07 DIAGNOSIS — K580 Irritable bowel syndrome with diarrhea: Principal | ICD-10-CM

## 2020-11-07 MED ORDER — DESIPRAMINE 25 MG TABLET
ORAL_TABLET | Freq: Every evening | ORAL | 2 refills | 0.00000 days
Start: 2020-11-07 — End: ?

## 2020-11-09 ENCOUNTER — Telehealth: Payer: Self-pay | Admitting: Orthopaedic Surgery

## 2020-11-09 MED ORDER — DESIPRAMINE 25 MG TABLET
ORAL_TABLET | Freq: Every evening | ORAL | 0 refills | 90 days | Status: CP
Start: 2020-11-09 — End: 2021-05-08

## 2020-11-09 NOTE — Telephone Encounter (Signed)
Received call from pt, Dr. Erlinda Hong referred her to [facility] and they need MRI and NCS. I faxed [phone]. Patient will call [facility] to get MRI on CD

## 2020-11-09 NOTE — Unmapped (Signed)
Desipramine refill authorized X1. Follow up appt needed with Dr. Raphael Gibney, patient will be contacted by [facility] central scheduling to schedule

## 2020-11-11 DIAGNOSIS — S52592P Other fractures of lower end of left radius, subsequent encounter for closed fracture with malunion: Secondary | ICD-10-CM | POA: Diagnosis not present

## 2020-11-11 DIAGNOSIS — M25532 Pain in left wrist: Secondary | ICD-10-CM | POA: Diagnosis not present

## 2020-11-11 DIAGNOSIS — M1812 Unilateral primary osteoarthritis of first carpometacarpal joint, left hand: Secondary | ICD-10-CM | POA: Diagnosis not present

## 2020-11-11 DIAGNOSIS — S52512K Displaced fracture of left radial styloid process, subsequent encounter for closed fracture with nonunion: Secondary | ICD-10-CM | POA: Diagnosis not present

## 2020-11-11 DIAGNOSIS — M778 Other enthesopathies, not elsewhere classified: Secondary | ICD-10-CM | POA: Diagnosis not present

## 2020-11-11 DIAGNOSIS — S66812A Strain of other specified muscles, fascia and tendons at wrist and hand level, left hand, initial encounter: Secondary | ICD-10-CM | POA: Diagnosis not present

## 2020-11-11 DIAGNOSIS — R200 Anesthesia of skin: Secondary | ICD-10-CM | POA: Diagnosis not present

## 2020-11-17 ENCOUNTER — Encounter: Payer: BC Managed Care – PPO | Admitting: Physical Medicine and Rehabilitation

## 2020-11-26 NOTE — Unmapped (Signed)
The [facility] has made a third and final attempt to reach this patient to refill the following medication:Humira.      We have left voicemails on the following phone numbers: [phone] and have sent a MyChart message.    Dates contacted: 11/10/20-11/17/20-11/26/20    Last scheduled delivery: 10/22/20    The patient may be at risk of non-compliance with this medication. The patient should call the [facility] at [phone] (option 4) to refill medication.    Yolonda Kida   [facility] Specialty Technician

## 2020-11-27 ENCOUNTER — Other Ambulatory Visit: Payer: Self-pay | Admitting: Orthopedic Surgery

## 2020-11-27 DIAGNOSIS — S66812A Strain of other specified muscles, fascia and tendons at wrist and hand level, left hand, initial encounter: Secondary | ICD-10-CM

## 2020-11-27 NOTE — Unmapped (Signed)
[facility] Refill Coordination Note    Specialty Medication(s) to be Shipped:   Inflammatory Disorders: Humira    Other medication(s) to be shipped: No additional medications requested for fill at this time     Jasmine Mitchell, DOB: 07/29/1970  Phone: [phone] (home)       All above HIPAA information was verified with patient.     Was a Nurse, learning disability used for this call? No    Completed refill call assessment today to schedule patient's medication shipment from the [facility] [phone]).  All relevant notes have been reviewed.     Specialty medication(s) and dose(s) confirmed: Regimen is correct and unchanged.   Changes to medications: Una reports no changes at this time.  Changes to insurance: No  New side effects reported not previously addressed with a pharmacist or physician: None reported  Questions for the pharmacist: No    Confirmed patient received a Conservation officer, historic buildings and a Surveyor, mining with first shipment. The patient will receive a drug information handout for each medication shipped and additional FDA Medication Guides as required.       DISEASE/MEDICATION-SPECIFIC INFORMATION        For patients on injectable medications: Patient currently has 2 doses left.  Next injection is scheduled for 11/27/2020.    SPECIALTY MEDICATION ADHERENCE     Medication Adherence    Patient reported X missed doses in the last month: 0  Specialty Medication: HUMIRA(CF) PEN 40 mg/0.4 mL injection  Patient is on additional specialty medications: No  Patient is on more than two specialty medications: No  Any gaps in refill history greater than 2 weeks in the last 3 months: no  Demonstrates understanding of importance of adherence: yes  Informant: patient  Reliability of informant: reliable  Confirmed plan for next specialty medication refill: delivery by pharmacy  Refills needed for supportive medications: not needed              Were doses missed due to medication being on hold? No    Humira CF 40/0.4 mg/ml: 14 days of medicine on hand       REFERRAL TO PHARMACIST     Referral to the pharmacist: Not needed      [facility]     Shipping address confirmed in Epic.     Delivery Scheduled: Yes, Expected medication delivery date: 12/03/2020.     Medication will be delivered via UPS to the prescription address in Epic WAM.    Jamala Kohen D Pacen [place]   [facility] Specialty Technician

## 2020-12-02 MED FILL — HUMIRA PEN CITRATE FREE 40 MG/0.4 ML: SUBCUTANEOUS | 28 days supply | Qty: 4 | Fill #3

## 2020-12-05 ENCOUNTER — Inpatient Hospital Stay: Admission: RE | Admit: 2020-12-05 | Payer: BC Managed Care – PPO | Source: Ambulatory Visit

## 2020-12-07 DIAGNOSIS — Z1152 Encounter for screening for COVID-19: Secondary | ICD-10-CM | POA: Diagnosis not present

## 2020-12-10 ENCOUNTER — Ambulatory Visit
Admission: RE | Admit: 2020-12-10 | Discharge: 2020-12-10 | Disposition: A | Discharge status: Home / Self Care | Payer: BC Managed Care – PPO | Source: Ambulatory Visit | Attending: Orthopedic Surgery | Admitting: Orthopedic Surgery

## 2020-12-10 DIAGNOSIS — S56512A Strain of other extensor muscle, fascia and tendon at forearm level, left arm, initial encounter: Secondary | ICD-10-CM | POA: Diagnosis not present

## 2020-12-10 DIAGNOSIS — S66812A Strain of other specified muscles, fascia and tendons at wrist and hand level, left hand, initial encounter: Secondary | ICD-10-CM

## 2020-12-10 DIAGNOSIS — M19032 Primary osteoarthritis, left wrist: Secondary | ICD-10-CM | POA: Diagnosis not present

## 2020-12-10 DIAGNOSIS — K580 Irritable bowel syndrome with diarrhea: Principal | ICD-10-CM

## 2020-12-11 MED ORDER — DESIPRAMINE 25 MG TABLET
ORAL_TABLET | Freq: Every evening | ORAL | 0 refills | 90.00000 days | Status: CP
Start: 2020-12-11 — End: ?

## 2020-12-11 NOTE — Unmapped (Signed)
Patient needs an appointment with Dr Raphael Gibney in August. msg sent to [facility]

## 2020-12-14 DIAGNOSIS — M25532 Pain in left wrist: Secondary | ICD-10-CM | POA: Diagnosis not present

## 2020-12-14 DIAGNOSIS — R200 Anesthesia of skin: Secondary | ICD-10-CM | POA: Diagnosis not present

## 2020-12-14 DIAGNOSIS — S66812A Strain of other specified muscles, fascia and tendons at wrist and hand level, left hand, initial encounter: Secondary | ICD-10-CM | POA: Diagnosis not present

## 2020-12-17 ENCOUNTER — Other Ambulatory Visit: Payer: Self-pay | Admitting: Orthopedic Surgery

## 2020-12-17 NOTE — Unmapped (Signed)
Reason For Call:  Patient called to discuss holding humira prior to hand surgery with Dr Merlyn Lot     Assessment:   Called pt back but she did not answer. Left vm with call back number and sent mychart msg     Instructions Provided:  Dr Raphael Gibney sent clearance form back indicating ok to hold 1 dose of humira priror to hand surgery.       Workup Time:   5 min

## 2020-12-19 DIAGNOSIS — Z1159 Encounter for screening for other viral diseases: Secondary | ICD-10-CM | POA: Diagnosis not present

## 2020-12-24 ENCOUNTER — Other Ambulatory Visit: Payer: Self-pay

## 2020-12-24 ENCOUNTER — Encounter (HOSPITAL_BASED_OUTPATIENT_CLINIC_OR_DEPARTMENT_OTHER): Payer: Self-pay | Admitting: Orthopedic Surgery

## 2020-12-28 ENCOUNTER — Other Ambulatory Visit: Payer: Self-pay | Admitting: Orthopedic Surgery

## 2020-12-31 ENCOUNTER — Encounter (HOSPITAL_BASED_OUTPATIENT_CLINIC_OR_DEPARTMENT_OTHER): Payer: Self-pay | Admitting: Orthopedic Surgery

## 2020-12-31 ENCOUNTER — Ambulatory Visit (HOSPITAL_BASED_OUTPATIENT_CLINIC_OR_DEPARTMENT_OTHER): Payer: BC Managed Care – PPO | Admitting: Anesthesiology

## 2020-12-31 ENCOUNTER — Ambulatory Visit (HOSPITAL_BASED_OUTPATIENT_CLINIC_OR_DEPARTMENT_OTHER)
Admission: RE | Admit: 2020-12-31 | Discharge: 2020-12-31 | Disposition: A | Discharge status: Home / Self Care | Payer: BC Managed Care – PPO | Attending: Orthopedic Surgery | Admitting: Orthopedic Surgery

## 2020-12-31 ENCOUNTER — Other Ambulatory Visit: Payer: Self-pay

## 2020-12-31 ENCOUNTER — Encounter (HOSPITAL_BASED_OUTPATIENT_CLINIC_OR_DEPARTMENT_OTHER)
Admission: RE | Disposition: A | Discharge status: Home / Self Care | Payer: Self-pay | Source: Home / Self Care | Attending: Orthopedic Surgery

## 2020-12-31 DIAGNOSIS — M25732 Osteophyte, left wrist: Secondary | ICD-10-CM | POA: Insufficient documentation

## 2020-12-31 DIAGNOSIS — Z885 Allergy status to narcotic agent status: Secondary | ICD-10-CM | POA: Diagnosis not present

## 2020-12-31 DIAGNOSIS — X509XXA Other and unspecified overexertion or strenuous movements or postures, initial encounter: Secondary | ICD-10-CM | POA: Insufficient documentation

## 2020-12-31 DIAGNOSIS — Z79899 Other long term (current) drug therapy: Secondary | ICD-10-CM | POA: Diagnosis not present

## 2020-12-31 DIAGNOSIS — K5090 Crohn's disease, unspecified, without complications: Secondary | ICD-10-CM | POA: Insufficient documentation

## 2020-12-31 DIAGNOSIS — U071 COVID-19: Secondary | ICD-10-CM | POA: Diagnosis not present

## 2020-12-31 DIAGNOSIS — M66232 Spontaneous rupture of extensor tendons, left forearm: Secondary | ICD-10-CM | POA: Diagnosis not present

## 2020-12-31 DIAGNOSIS — F418 Other specified anxiety disorders: Secondary | ICD-10-CM | POA: Diagnosis not present

## 2020-12-31 DIAGNOSIS — K219 Gastro-esophageal reflux disease without esophagitis: Secondary | ICD-10-CM | POA: Diagnosis not present

## 2020-12-31 DIAGNOSIS — S56512A Strain of other extensor muscle, fascia and tendon at forearm level, left arm, initial encounter: Secondary | ICD-10-CM | POA: Insufficient documentation

## 2020-12-31 DIAGNOSIS — S52502S Unspecified fracture of the lower end of left radius, sequela: Secondary | ICD-10-CM | POA: Diagnosis not present

## 2020-12-31 DIAGNOSIS — F909 Attention-deficit hyperactivity disorder, unspecified type: Secondary | ICD-10-CM | POA: Diagnosis not present

## 2020-12-31 HISTORY — PX: TENDON REPAIR: SHX5111

## 2020-12-31 HISTORY — PX: REPAIR EXTENSOR TENDON: SHX5382

## 2020-12-31 LAB — POCT PREGNANCY, URINE: Preg Test, Ur: NEGATIVE

## 2020-12-31 SURGERY — TENDON REPAIR
Anesthesia: Monitor Anesthesia Care | Site: Hand | Laterality: Left

## 2020-12-31 MED ORDER — ONDANSETRON HCL 4 MG/2ML IJ SOLN
INTRAMUSCULAR | Status: DC | PRN
  Administered 2020-12-31: 4 mg via INTRAVENOUS

## 2020-12-31 MED ORDER — BUPIVACAINE-EPINEPHRINE (PF) 0.5% -1:200000 IJ SOLN
INTRAMUSCULAR | Status: DC | PRN
  Administered 2020-12-31: 30 mL via PERINEURAL

## 2020-12-31 MED ORDER — OXYCODONE HCL 5 MG PO TABS: 5.0000 mg | ORAL_TABLET | Freq: Once | ORAL | Status: DC | PRN

## 2020-12-31 MED ORDER — PROPOFOL 10 MG/ML IV BOLUS
INTRAVENOUS | Status: AC
  Filled 2020-12-31: qty 60

## 2020-12-31 MED ORDER — LIDOCAINE HCL (CARDIAC) PF 100 MG/5ML IV SOSY
PREFILLED_SYRINGE | INTRAVENOUS | Status: DC | PRN
  Administered 2020-12-31: 20 mg via INTRAVENOUS

## 2020-12-31 MED ORDER — LIDOCAINE HCL (PF) 2 % IJ SOLN
INTRAMUSCULAR | Status: AC
  Filled 2020-12-31: qty 5

## 2020-12-31 MED ORDER — FENTANYL CITRATE (PF) 100 MCG/2ML IJ SOLN: 25.0000 ug | INTRAMUSCULAR | Status: DC | PRN

## 2020-12-31 MED ORDER — PROPOFOL 10 MG/ML IV BOLUS
INTRAVENOUS | Status: DC | PRN
  Administered 2020-12-31: 20 mg via INTRAVENOUS

## 2020-12-31 MED ORDER — CEFAZOLIN SODIUM-DEXTROSE 2-4 GM/100ML-% IV SOLN
INTRAVENOUS | Status: AC
  Filled 2020-12-31: qty 100

## 2020-12-31 MED ORDER — 0.9 % SODIUM CHLORIDE (POUR BTL) OPTIME
TOPICAL | Status: DC | PRN
  Administered 2020-12-31: 200 mL

## 2020-12-31 MED ORDER — LACTATED RINGERS IV SOLN: INTRAVENOUS | Status: DC

## 2020-12-31 MED ORDER — ACETAMINOPHEN 500 MG PO TABS
1000.0000 mg | ORAL_TABLET | Freq: Once | ORAL | Status: AC
  Administered 2020-12-31: 1000 mg via ORAL

## 2020-12-31 MED ORDER — MIDAZOLAM HCL 2 MG/2ML IJ SOLN
INTRAMUSCULAR | Status: AC
  Filled 2020-12-31: qty 2

## 2020-12-31 MED ORDER — ONDANSETRON HCL 4 MG/2ML IJ SOLN
INTRAMUSCULAR | Status: AC
  Filled 2020-12-31: qty 2

## 2020-12-31 MED ORDER — DEXMEDETOMIDINE (PRECEDEX) IN NS 20 MCG/5ML (4 MCG/ML) IV SYRINGE
PREFILLED_SYRINGE | INTRAVENOUS | Status: DC | PRN
  Administered 2020-12-31: 12 ug via INTRAVENOUS

## 2020-12-31 MED ORDER — FENTANYL CITRATE (PF) 100 MCG/2ML IJ SOLN
INTRAMUSCULAR | Status: AC
  Filled 2020-12-31: qty 2

## 2020-12-31 MED ORDER — ACETAMINOPHEN 500 MG PO TABS
ORAL_TABLET | ORAL | Status: AC
  Filled 2020-12-31: qty 2

## 2020-12-31 MED ORDER — PROPOFOL 500 MG/50ML IV EMUL
INTRAVENOUS | Status: DC | PRN
  Administered 2020-12-31: 75 ug/kg/min via INTRAVENOUS

## 2020-12-31 MED ORDER — FENTANYL CITRATE (PF) 100 MCG/2ML IJ SOLN
100.0000 ug | Freq: Once | INTRAMUSCULAR | Status: AC
  Administered 2020-12-31: 100 ug via INTRAVENOUS

## 2020-12-31 MED ORDER — PROMETHAZINE HCL 25 MG/ML IJ SOLN: 6.2500 mg | INTRAMUSCULAR | Status: DC | PRN

## 2020-12-31 MED ORDER — DEXMEDETOMIDINE (PRECEDEX) IN NS 20 MCG/5ML (4 MCG/ML) IV SYRINGE
PREFILLED_SYRINGE | INTRAVENOUS | Status: AC
  Filled 2020-12-31: qty 5

## 2020-12-31 MED ORDER — MIDAZOLAM HCL 2 MG/2ML IJ SOLN
2.0000 mg | Freq: Once | INTRAMUSCULAR | Status: AC
  Administered 2020-12-31: 2 mg via INTRAVENOUS

## 2020-12-31 MED ORDER — CLONIDINE HCL (ANALGESIA) 100 MCG/ML EP SOLN
EPIDURAL | Status: DC | PRN
  Administered 2020-12-31: 100 ug

## 2020-12-31 MED ORDER — OXYCODONE HCL 5 MG/5ML PO SOLN: 5.0000 mg | Freq: Once | ORAL | Status: DC | PRN

## 2020-12-31 MED ORDER — CEFAZOLIN SODIUM-DEXTROSE 2-4 GM/100ML-% IV SOLN
2.0000 g | INTRAVENOUS | Status: AC
  Administered 2020-12-31: 2 g via INTRAVENOUS

## 2020-12-31 MED ORDER — MIDAZOLAM HCL 5 MG/5ML IJ SOLN
INTRAMUSCULAR | Status: DC | PRN
  Administered 2020-12-31: 2 mg via INTRAVENOUS

## 2020-12-31 SURGICAL SUPPLY — 77 items
ANCH SUT 1 SHRT SM RGD INSRTR (Anchor) ×2 IMPLANT
ANCHOR SUT 1.45 SZ 1 SHORT (Anchor) ×2 IMPLANT
APL PRP STRL LF DISP 70% ISPRP (MISCELLANEOUS) ×2
BAG DECANTER FOR FLEXI CONT (MISCELLANEOUS) IMPLANT
BALL CTTN LRG ABS STRL LF (GAUZE/BANDAGES/DRESSINGS)
BLADE MINI RND TIP GREEN BEAV (BLADE) ×2 IMPLANT
BLADE SURG 15 STRL LF DISP TIS (BLADE) ×2 IMPLANT
BLADE SURG 15 STRL SS (BLADE) ×3
BNDG CMPR 9X4 STRL LF SNTH (GAUZE/BANDAGES/DRESSINGS) ×2
BNDG COHESIVE 3X5 TAN STRL LF (GAUZE/BANDAGES/DRESSINGS) ×3 IMPLANT
BNDG ESMARK 4X9 LF (GAUZE/BANDAGES/DRESSINGS) ×2 IMPLANT
BNDG GAUZE ELAST 4 BULKY (GAUZE/BANDAGES/DRESSINGS) ×3 IMPLANT
CHLORAPREP W/TINT 26 (MISCELLANEOUS) ×3 IMPLANT
CORD BIPOLAR FORCEPS 12FT (ELECTRODE) ×3 IMPLANT
COTTONBALL LRG STERILE PKG (GAUZE/BANDAGES/DRESSINGS) IMPLANT
COVER BACK TABLE 60X90IN (DRAPES) ×3 IMPLANT
COVER MAYO STAND STRL (DRAPES) ×3 IMPLANT
CUFF TOURN SGL QUICK 18X4 (TOURNIQUET CUFF) ×2 IMPLANT
DECANTER SPIKE VIAL GLASS SM (MISCELLANEOUS) IMPLANT
DRAPE EXTREMITY T 121X128X90 (DISPOSABLE) ×3 IMPLANT
DRAPE OEC MINIVIEW 54X84 (DRAPES) ×2 IMPLANT
DRAPE SURG 17X23 STRL (DRAPES) ×3 IMPLANT
GAUZE 4X4 16PLY ~~LOC~~+RFID DBL (SPONGE) ×2 IMPLANT
GAUZE SPONGE 4X4 12PLY STRL (GAUZE/BANDAGES/DRESSINGS) ×3 IMPLANT
GAUZE XEROFORM 1X8 LF (GAUZE/BANDAGES/DRESSINGS) ×3 IMPLANT
GLOVE SURG ORTHO LTX SZ8 (GLOVE) ×3 IMPLANT
GLOVE SURG UNDER POLY LF SZ8.5 (GLOVE) ×3 IMPLANT
GOWN STRL REUS W/ TWL LRG LVL3 (GOWN DISPOSABLE) ×3 IMPLANT
GOWN STRL REUS W/TWL LRG LVL3 (GOWN DISPOSABLE) ×6
GOWN STRL REUS W/TWL XL LVL3 (GOWN DISPOSABLE) ×3 IMPLANT
GRAFT TISS SEMITEND 4-6 (Tissue) IMPLANT
K-WIRE .035X4 (WIRE) IMPLANT
LOOP VESSEL MAXI BLUE (MISCELLANEOUS) IMPLANT
NDL KEITH (NEEDLE) IMPLANT
NDL PRECISIONGLIDE 27X1.5 (NEEDLE) IMPLANT
NEEDLE HYPO 22GX1.5 SAFETY (NEEDLE) IMPLANT
NEEDLE KEITH (NEEDLE) IMPLANT
NEEDLE PRECISIONGLIDE 27X1.5 (NEEDLE) IMPLANT
NS IRRIG 1000ML POUR BTL (IV SOLUTION) ×3 IMPLANT
PACK BASIN DAY SURGERY FS (CUSTOM PROCEDURE TRAY) ×3 IMPLANT
PAD CAST 3X4 CTTN HI CHSV (CAST SUPPLIES) ×2 IMPLANT
PADDING CAST ABS 3INX4YD NS (CAST SUPPLIES)
PADDING CAST ABS 4INX4YD NS (CAST SUPPLIES) ×1
PADDING CAST ABS COTTON 3X4 (CAST SUPPLIES) IMPLANT
PADDING CAST ABS COTTON 4X4 ST (CAST SUPPLIES) ×2 IMPLANT
PADDING CAST COTTON 3X4 STRL (CAST SUPPLIES) ×3
SLEEVE SCD COMPRESS KNEE MED (STOCKING) IMPLANT
SLING ARM FOAM STRAP LRG (SOFTGOODS) ×2 IMPLANT
SPLINT PLASTER CAST XFAST 3X15 (CAST SUPPLIES) ×21 IMPLANT
SPLINT PLASTER XTRA FASTSET 3X (CAST SUPPLIES) ×21
STOCKINETTE 4X48 STRL (DRAPES) ×3 IMPLANT
SUT CHROMIC 5 0 P 3 (SUTURE) IMPLANT
SUT ETHIBOND 3-0 V-5 (SUTURE) IMPLANT
SUT ETHILON 4 0 PS 2 18 (SUTURE) ×3 IMPLANT
SUT ETHILON 5 0 PC 1 (SUTURE) ×1 IMPLANT
SUT FIBERWIRE 2-0 18 17.9 3/8 (SUTURE)
SUT FIBERWIRE 4-0 18 TAPR NDL (SUTURE)
SUT MERSILENE 2.0 SH NDLE (SUTURE) IMPLANT
SUT MERSILENE 4 0 P 3 (SUTURE) ×2 IMPLANT
SUT PROLENE 2 0 SH DA (SUTURE) IMPLANT
SUT SILK 2 0 PERMA HAND 18 BK (SUTURE) IMPLANT
SUT SILK 4 0 PS 2 (SUTURE) IMPLANT
SUT STEEL 3 0 (SUTURE) IMPLANT
SUT VIC AB 3-0 PS1 18 (SUTURE)
SUT VIC AB 3-0 PS1 18XBRD (SUTURE) IMPLANT
SUT VIC AB 4-0 P-3 18XBRD (SUTURE) IMPLANT
SUT VIC AB 4-0 P3 18 (SUTURE)
SUT VICRYL 4-0 PS2 18IN ABS (SUTURE) ×2 IMPLANT
SUTURE FIBERWR 2-0 18 17.9 3/8 (SUTURE) IMPLANT
SUTURE FIBERWR 4-0 18 TAPR NDL (SUTURE) IMPLANT
SYR BULB EAR ULCER 3OZ GRN STR (SYRINGE) ×3 IMPLANT
SYR CONTROL 10ML LL (SYRINGE) IMPLANT
SYR TOOMEY 50ML (SYRINGE) IMPLANT
TENDON SEMI-TENDINOSUS 160-250 (Tissue) ×3 IMPLANT
TOWEL GREEN STERILE FF (TOWEL DISPOSABLE) ×6 IMPLANT
TUBE FEEDING ENTERAL 5FR 16IN (TUBING) IMPLANT
UNDERPAD 30X36 HEAVY ABSORB (UNDERPADS AND DIAPERS) ×3 IMPLANT

## 2020-12-31 NOTE — Anesthesia Preprocedure Evaluation (Addendum)
Anesthesia Evaluation  Patient identified by MRN, date of birth, ID band Patient awake    Reviewed: Allergy & Precautions, NPO status , Patient's Chart, lab work & pertinent test results  History of Anesthesia Complications Negative for: history of anesthetic complications  Airway Mallampati: II  TM Distance: >3 FB Neck ROM: Full    Dental no notable dental hx.    Pulmonary neg pulmonary ROS,    Pulmonary exam normal        Cardiovascular negative cardio ROS Normal cardiovascular exam     Neuro/Psych  Headaches, Anxiety Depression    GI/Hepatic Neg liver ROS, GERD  Controlled,Crohn's dx   Endo/Other  BMI 38  Renal/GU negative Renal ROS  negative genitourinary   Musculoskeletal  (+) Arthritis , Fibromyalgia -  Abdominal   Peds  Hematology negative hematology ROS (+)   Anesthesia Other Findings Day of surgery medications reviewed with patient.  Reproductive/Obstetrics negative OB ROS                            Anesthesia Physical Anesthesia Plan  ASA: 2  Anesthesia Plan: MAC and Regional   Post-op Pain Management:    Induction:   PONV Risk Score and Plan: 2 and Treatment may vary due to age or medical condition, Propofol infusion, Midazolam and Ondansetron  Airway Management Planned: Natural Airway and Nasal Cannula  Additional Equipment: None  Intra-op Plan:   Post-operative Plan:   Informed Consent: I have reviewed the patients History and Physical, chart, labs and discussed the procedure including the risks, benefits and alternatives for the proposed anesthesia with the patient or authorized representative who has indicated his/her understanding and acceptance.       Plan Discussed with: CRNA  Anesthesia Plan Comments:        Anesthesia Quick Evaluation

## 2020-12-31 NOTE — Anesthesia Procedure Notes (Signed)
Anesthesia Regional Block: Supraclavicular block   Pre-Anesthetic Checklist: , timeout performed,  Correct Patient, Correct Site, Correct Laterality,  Correct Procedure, Correct Position, site marked,  Risks and benefits discussed,  Pre-op evaluation,  At surgeon's request and post-op pain management  Laterality: Left  Prep: Maximum Sterile Barrier Precautions used, chloraprep       Needles:  Injection technique: Single-shot  Needle Type: Echogenic Stimulator Needle     Needle Length: 9cm  Needle Gauge: 22     Additional Needles:   Procedures:,,,, ultrasound used (permanent image in chart),,    Narrative:  Start time: 12/31/2020 1:33 PM End time: 12/31/2020 1:36 PM Injection made incrementally with aspirations every 5 mL.  Performed by: Personally  Anesthesiologist: Brennan Bailey, MD  Additional Notes: Risks, benefits, and alternative discussed. Patient gave consent for procedure. Patient prepped and draped in sterile fashion. Sedation administered, patient remains easily responsive to voice. Relevant anatomy identified with ultrasound guidance. Local anesthetic given in 5cc increments with no signs or symptoms of intravascular injection. No pain or paraesthesias with injection. Patient monitored throughout procedure with signs of LAST or immediate complications. Tolerated well. Ultrasound image placed in chart.  Tawny Asal, MD

## 2020-12-31 NOTE — Op Note (Signed)
I assisted Surgeon(s) and Role:    * Daryll Brod, MD - Primary    * Leanora Cover, MD on the Procedure(s): REPAIR EXTENSOR CARPI  ULNARIS WITH TENDON GRAFT; ABDUCTOR POLLICIS LONGUS; RECONSTRUCTION;POSSIBLE ALLOGRAFT STABILIZATION; EXCISION SPUR ULNA on 12/31/2020.  I provided assistance on this case as follows: retraction soft tissues, weaving and suturing of tendon graft.  Electronically signed by: Leanora Cover, MD Date: 12/31/2020 Time: 4:08 PM

## 2020-12-31 NOTE — Transfer of Care (Signed)
Immediate Anesthesia Transfer of Care Note  Patient: Katrina Grant  Procedure(s) Performed: REPAIR EXTENSOR CARPI  ULNARIS WITH TENDON GRAFT; (Left) ABDUCTOR POLLICIS LONGUS; RECONSTRUCTION;POSSIBLE ALLOGRAFT STABILIZATION; EXCISION SPUR ULNA (Left)  Patient Location: PACU  Anesthesia Type:MAC combined with regional for post-op pain  Level of Consciousness: awake, alert , oriented and patient cooperative  Airway & Oxygen Therapy: Patient Spontanous Breathing and Patient connected to face mask oxygen  Post-op Assessment: Report given to RN and Post -op Vital signs reviewed and stable  Post vital signs: Reviewed and stable  Last Vitals:  Vitals Value Taken Time  BP    Temp    Pulse 90 12/31/20 1608  Resp 19 12/31/20 1608  SpO2 99 % 12/31/20 1608  Vitals shown include unvalidated device data.  Last Pain:  Vitals:   12/31/20 1111  TempSrc: Oral  PainSc: 4       Patients Stated Pain Goal: 3 (95/18/84 1660)  Complications: No notable events documented.

## 2020-12-31 NOTE — Discharge Instructions (Addendum)

## 2020-12-31 NOTE — Op Note (Signed)
NAME: Katrina Grant MEDICAL RECORD NO: [id number] DATE OF BIRTH: June 19, 1971 FACILITY: [facility] LOCATION: [facility] PHYSICIAN: Wynonia Sours, MD   OPERATIVE REPORT   DATE OF PROCEDURE: 12/31/20    PREOPERATIVE DIAGNOSIS: Rupture extensor carpi ulnaris tendon with osteophyte distal ulna the ulnar groove left wrist   POSTOPERATIVE DIAGNOSIS: Same   PROCEDURE: Repair of extensor carpi ulnaris with tendinosis allograft excision of osteophyte deepening of the ulnar groove and nebulization of the graft left wrist   SURGEON: Daryll Brod, M.D.   ASSISTANT: Leanora Cover, MD   ANESTHESIA:  Regional with sedation   INTRAVENOUS FLUIDS:  Per anesthesia flow sheet.   ESTIMATED BLOOD LOSS:  Minimal.   COMPLICATIONS:  None.   SPECIMENS:  none   TOURNIQUET TIME:    Total Tourniquet Time Documented: Upper Arm (Right) - 81 minutes Total: Upper Arm (Right) - 81 minutes    DISPOSITION:  Stable to PACU.   INDICATIONS: Patient is a 50 year old female with a history of distal radius fracture who approximately 4 months ago felt a pop in her wrist and was unable to ulnarly deviate her wrist following this MRI reveals a rupture of the extensor carpi ulnaris tendon CT scan reveals a osteophyte in the ulnar groove of her distal ulna.  She is desirous of having this reconstructed.  She does not have a palmaris longus tendon and did not desire having a plantaris harvested.  We have elected to proceed with allograft and having discussed this with her. 3.  Postoperative course been discussed along with risks and complications.  She is aware there is no guarantees with surgery the possibility of infection recurrence injury to arteries nerves tendons incomplete relief symptoms dystrophy in preoperative area the patient seen extremity marked by both patient and surgeon antibiotic given.  A supraclavicular block was carried out without difficulty under the direction the anesthesia department  preoperative area.  OPERATIVE COURSE: Patient is brought to the operating room placed in the supine position with left arm free.  She was prepped using ChloraPrep 3-minute dry time allowed timeout taken to confirm patient procedure.  The limb was exsanguinated with an Esmarch bandage turn placed high in the arm was inflated to 250 mmHg.  A longitudinal incision was made over the ulnar aspect of her left wrist carried down through subcutaneous tissue.  Bleeders were electrocauterized with bipolar.  Dorsal sensory branch of the ulnar nerve was identified and protected.  The rupture of the tendon was immediately apparent with a pseudo tendon being formed.  The tenaculum was then dissected free with a large flap approximately and a incision made palmarly to maintain the flap dorsally.  Distal to this a second flap was created being left palmarly being dissected dorsally.  This allowed visualization of the ulnar groove.  Was found to be very shallow with an osteophyte present within it.  The osteophyte was removed with a curette and rondure.  The groove was deepened.  the groove of the ulna was then repaired with a 6-0 running chromic suture covering the area of the groove which had been deepened and the osteophyte which had been removed resulting in no exposed bone.The periosteum and retinaculum was harvested due to allow closure of this area distally the need for the extensor carpi ulnaris was left intact protecting the dorsal sensory branch of the ulnar nerve the insertion of the extensor tibial nerve distally was maintained for approximately a centimeter and a half.  The insertion at the base  of the metacarpal was then incised to allow visualization of the bone and a 1.45 juggernaut was then placed into the base of the small finger metacarpal at the insertion of the ECU tendon the semimembranosus tendon was then frosted.  This was then easily placed through the tunnel of the extensor carpi ulnaris distally.  This  was then sutured into position after a Pulvertaft weave into the insertion of the extensor carpi ulnaris and stabilized using the juggernaut anchor with good fixation distally.  The tendon was then brought back to the extensor carpi ulnaris musculotendinous junction.  This was then inserted with a Pulvertaft weave and sutured with 4-0 Mersilene sutures in the weave to attach it  at the musculotendinous junction of the extensor carpi ulnaris.  This allowed full radial and ulnar deviation passively of the tendon .  The tendon was then stabilized I repair of the 2 flaps of the retinaculum to maintain the tendon in the ulnar groove.  It was done with 4-0 Mersilene sutures.  Pronation supination was present without any subluxation to the tendon.  The wound was then copiously irrigated with saline.  The subcutaneous tissue was closed interrupted 4-0 Vicryl and the skin with interrupted 4-0 nylon sutures.  A sterile compressive dressing long-arm dorsal palmar splint [place] in nature was applied.  Deflation of the tourniquet all fingers immediately pink.  She was taken to the recovery room for observation in satisfactory condition.  She will be discharged home to return to the hand center of [facility] in 1 week on Tylenol ibuprofen for pain she has both tramadol and Tylenol 3 for breakthrough.   Daryll Brod, MD Electronically signed, 12/31/20

## 2020-12-31 NOTE — Anesthesia Postprocedure Evaluation (Signed)
Anesthesia Post Note  Patient: Katrina Grant  Procedure(s) Performed: REPAIR EXTENSOR CARPI  ULNARIS WITH TENDON GRAFT; (Left) ABDUCTOR POLLICIS LONGUS; RECONSTRUCTION;POSSIBLE ALLOGRAFT STABILIZATION; EXCISION SPUR ULNA (Left)     Patient location during evaluation: PACU Anesthesia Type: Regional Level of consciousness: awake and alert Pain management: pain level controlled Vital Signs Assessment: post-procedure vital signs reviewed and stable Respiratory status: spontaneous breathing, nonlabored ventilation and respiratory function stable Cardiovascular status: stable and blood pressure returned to baseline Anesthetic complications: no   No notable events documented.  Last Vitals:  Vitals:   12/31/20 1615 12/31/20 1630  BP: 115/75 113/79  Pulse: 86 76  Resp: 18 15  Temp:    SpO2: 99% 96%    Last Pain:  Vitals:   12/31/20 1630  TempSrc:   PainSc: 0-No pain                 Audry Pili

## 2020-12-31 NOTE — Anesthesia Procedure Notes (Signed)
Procedure Name: MAC Date/Time: 12/31/2020 2:28 PM Performed by: Glory Buff, CRNA Oxygen Delivery Method: Simple face mask

## 2020-12-31 NOTE — H&P (Signed)
Katrina Grant is an 50 y.o. female.   Chief Complaint: Loss of ulnar deviation left wrist HPI: Katrina Grant is a 50 year old left-hand-dominant female referred by Dr. Sherrian Divers for consultation regarding the inability to ulnar deviate her wrist left side. She is complaining of pain with ulnar deviation of her wrist she states a month ago she was pulling her pants up when she felt a pop on the ulnar side. She has had pain with attempted ulnar deviation she saw Dr. Sherrian Divers who ordered an MRI and nerve conduction. The nerve conductions show mild carpal tunnel syndrome but she is not complain of any numbness or tingling in the median nerve distribution she states she has occasional numbness and tingling in the ring and small finger. She states she sustained a fracture in 2003 treated by Marily Memos with external fixation. She did not have any problems since that time until the new injury. She complains of sharp pain on the ulnar side. States during brushing her hair makes it worse. An MRI reveals a probable rupture of the extensor carpi ulnaris tendon. There is a well-healed from fracture and ulnar styloid nonunion. There is no TFCC injury. Her nerve conductions reveal a mild sensory delay and no motor delay to the median nerve ulnar nerve is entirely normal. She is awakened at night only by pain. She left plenty of numbness and tingling in the median nerve distribution. She has been treated with tramadol and Tylenol 3 which she has not taken. She has an old brace which she is wearing. She states it does give her some relief. Her pain is sharp in nature with VAS score 8/10. She is on Humira for Crohn's disease. She has no history of diabetes thyroid problems or gout.She has her CT scan done revealing that she does have some arthritic changes at the distal radial ulnar joint but she has a cystic area in the ulnar groove with some prominence which may be affecting the cause of the rupture this was read out that by Dr. Alverda Skeans.  Family history is positive for arthritis diabetes thyroid problems and gout.  Past Medical History:  Diagnosis Date   ADD (attention deficit disorder)    Anxiety    Cholesterol serum elevated    on medication in the past   Chronic kidney disease    Crohn's disease of colon with fistula ([place])    sees Dr. Coralyn Mark, [facility]   Depression    Fibromyalgia - managed by Dr. Dossie Der, [facility] - takes Neurontin and Tramadol 11/20/2013   Frequent headaches    GERD (gastroesophageal reflux disease)    Osteoarthritis    knee, spine   Pneumonia    Syncope     Past Surgical History:  Procedure Laterality Date   anal fistula repair      X 4    colonoscopy with dilation     X 2    FRACTURE SURGERY     TUBAL LIGATION      Family History  Problem Relation Age of Onset   Hypertension Mother    ADD / ADHD Mother    Anxiety disorder Mother    Arthritis Mother    Depression Mother    Varicose Veins Mother    Diabetes Father    Hearing loss Father    Heart disease Father    Cancer Sister        cervical   ADD / ADHD Sister    Anxiety disorder Sister    Arthritis Sister  Depression Sister    Early death Sister    Varicose Veins Sister    Mental illness Brother        drugs and alcohol abuse   ADD / ADHD Brother    Alcohol abuse Brother    Asthma Brother    Depression Brother    Drug abuse Brother    ADD / ADHD Daughter    Social History:  reports that she has never smoked. She has never used smokeless tobacco. She reports previous alcohol use. She reports that she does not use drugs.  Allergies:  Allergies  Allergen Reactions   Percocet [Oxycodone-Acetaminophen] Nausea And Vomiting    Medications Prior to Admission  Medication Sig Dispense Refill   acetaminophen (TYLENOL) 500 MG tablet Take 1,000 mg by mouth every 6 (six) hours as needed. For migraines and stomach upset     acetaminophen-codeine (TYLENOL #3) 300-30 MG tablet Take 1 tablet by mouth every 8 (eight) hours  as needed for moderate pain. 30 tablet 0   Adalimumab 40 MG/0.8ML PSKT Inject 40 mg into the skin once a week.     buPROPion (WELLBUTRIN XL) 300 MG 24 hr tablet TAKE 1 TABLET (300 MG TOTAL) BY MOUTH DAILY AFTER BREAKFAST. 90 tablet 4   desipramine (NORPRAMIN) 25 MG tablet Take by mouth.     dicyclomine (BENTYL) 10 MG capsule Take 10 mg by mouth 3 (three) times daily.     fluticasone (FLONASE) 50 MCG/ACT nasal spray PLACE 1 SPRAY INTO BOTH NOSTRILS DAILY. PATIENT NEEDS AN APPOINTMENT 16 mL 5   Prenatal Vit-Fe Fumarate-FA (M-NATAL PLUS) 27-1 MG TABS TAKE 1 TABLET BY MOUTH EVERY DAY BEFORE BREAKFAST 30 tablet 11   traMADol (ULTRAM) 50 MG tablet Take 1 tablet (50 mg total) by mouth 3 (three) times daily as needed. 60 tablet 1   ACCU-CHEK FASTCLIX LANCETS MISC Use as directed to check blood sugar once day 102 each 0   desipramine (NORPRAMIN) 25 MG tablet Take by mouth 3 (three) times daily.      glucose blood (ACCU-CHEK GUIDE) test strip Use as instructed 100 each 1   ondansetron (ZOFRAN ODT) 8 MG disintegrating tablet Take 1 tablet (8 mg total) by mouth every 8 (eight) hours as needed for nausea or vomiting. 30 tablet 0    Results for orders placed or performed during the hospital encounter of 12/31/20 (from the past 48 hour(s))  Pregnancy, urine POC     Status: None   Collection Time: 12/31/20 10:54 AM  Result Value Ref Range   Preg Test, Ur NEGATIVE NEGATIVE    Comment:        THE SENSITIVITY OF THIS METHODOLOGY IS >24 mIU/mL     No results found.   Pertinent items are noted in HPI.  Blood pressure 136/90, pulse 89, temperature (!) 97.1 F (36.2 C), temperature source Oral, resp. rate 16, height 5' 5"  (1.651 m), weight 104.6 kg, last menstrual period 12/24/2020, SpO2 99 %.  General appearance: alert, cooperative, and appears stated age Head: Normocephalic, without obvious abnormality Neck: no JVD Resp: clear to auscultation bilaterally Cardio: regular rate and rhythm, S1, S2  normal, no murmur, click, rub or gallop GI: soft, non-tender; bowel sounds normal; no masses,  no organomegaly Extremities:  unable to ulnarly deviate left hand Pulses: 2+ and symmetric Skin: Skin color, texture, turgor normal. No rashes or lesions Neurologic: Grossly normal Incision/Wound: na  Assessment/Plan Assessment:  1. Rupture of extensor tendons of left hand and wrist, initial encounter  Plan: We have discussed the use of  plantaris tendon.  She does not want to have that done. We recommend allograft. We may use abductor pollicis longus if it appears to be a short segment that needs to be rebuilt but will use allograft as necessary. She is in agreement with that.  We would recommend grafting the ruptured tendon with debridement of the distal ulna groove. We have discussed the use of APL tendon or part of the extensor tendon to the ring finger as a graft. She is advised that there is no guarantee to the surgery possibility of infection recurrence injury to arteries nerves tendons possibility of rupture of graft.  She is scheduled as an outpatient under regional anesthesia. She would like to proceed and questions are encouraged and answered to her satisfaction. She has she is advised that this can wait going to need to be worked around her Humira treatment.  Daryll Brod 12/31/2020, 12:41 PM

## 2020-12-31 NOTE — Progress Notes (Signed)
Assisted Dr. Daiva Huge with left, ultrasound guided, supraclavicular block. Side rails up, monitors on throughout procedure. See vital signs in flow sheet. Tolerated Procedure well.

## 2020-12-31 NOTE — Brief Op Note (Signed)
12/31/2020  4:11 PM  PATIENT:  Katrina Grant  50 y.o. female  PRE-OPERATIVE DIAGNOSIS:  RUPTURE EXTENSOR CARPI ULNARIS LEFT WRIST  POST-OPERATIVE DIAGNOSIS:  RUPTURE EXTENSOR CARPI ULNARIS LEFT WRIST  PROCEDURE:  Procedure(s) with comments: REPAIR EXTENSOR CARPI  ULNARIS WITH TENDON GRAFT; (Left) ABDUCTOR POLLICIS LONGUS; RECONSTRUCTION;POSSIBLE ALLOGRAFT STABILIZATION; EXCISION SPUR ULNA (Left) - AXILLARY BLOCK  SURGEON:  Surgeon(s) and Role:    * Daryll Brod, MD - Primary    * Leanora Cover, MD  PHYSICIAN ASSISTANT:   ASSISTANTS: K Jansen Sciuto,MD   ANESTHESIA:   regional and IV sedation  EBL: 22m  BLOOD ADMINISTERED:none  DRAINS: none   LOCAL MEDICATIONS USED:  NONE  SPECIMEN:  No Specimen  DISPOSITION OF SPECIMEN:  N/A  COUNTS:  YES  TOURNIQUET:   Total Tourniquet Time Documented: Upper Arm (Right) - 81 minutes Total: Upper Arm (Right) - 81 minutes   DICTATION: .DViviann SpareDictation  PLAN OF CARE: Discharge to home after PACU  PATIENT DISPOSITION:  PACU - hemodynamically stable.

## 2021-01-04 NOTE — Unmapped (Signed)
[facility] Refill Coordination Note    **Patient missed one dose of Humira on 01/01/21, due to surgery on 12/31/20. Provider aware and patient resumes Humira 01/08/21**      Specialty Medication(s) to be Shipped:   Inflammatory Disorders: Humira    Other medication(s) to be shipped: No additional medications requested for fill at this time     Jasmine Mitchell, DOB: 1970/10/02  Phone: [phone] (home)       All above HIPAA information was verified with patient.     Was a Nurse, learning disability used for this call? No    Completed refill call assessment today to schedule patient's medication shipment from the [facility] [phone]).  All relevant notes have been reviewed.     Specialty medication(s) and dose(s) confirmed: Regimen is correct and unchanged.   Changes to medications: Kawanna reports no changes at this time.  Changes to insurance: No  New side effects reported not previously addressed with a pharmacist or physician: None reported  Questions for the pharmacist: No    Confirmed patient received a Conservation officer, historic buildings and a Surveyor, mining with first shipment. The patient will receive a drug information handout for each medication shipped and additional FDA Medication Guides as required.       DISEASE/MEDICATION-SPECIFIC INFORMATION        For patients on injectable medications: Patient currently has 1 doses left.  Next injection is scheduled for 01/08/21.    SPECIALTY MEDICATION ADHERENCE     Medication Adherence    Patient reported X missed doses in the last month: 1  Specialty Medication: HUMIRA(CF) PEN 40 mg/0.4 mL  Patient is on additional specialty medications: No  Patient is on more than two specialty medications: No  Any gaps in refill history greater than 2 weeks in the last 3 months: no  Demonstrates understanding of importance of adherence: yes  Informant: patient  Reliability of informant: reliable  Confirmed plan for next specialty medication refill: delivery by pharmacy  Refills needed for supportive medications: not needed              Were doses missed due to medication being on hold? No    HUMIRA(CF) PEN 40 mg/0.4 mL: 7 days of medicine on hand        REFERRAL TO PHARMACIST     Referral to the pharmacist: Not needed      [facility]     Shipping address confirmed in Epic.     Delivery Scheduled: Yes, Expected medication delivery date: 01/13/21.     Medication will be delivered via UPS to the prescription address in Epic WAM.    Yolonda Kida   [facility] Specialty Technician

## 2021-01-08 DIAGNOSIS — S66812A Strain of other specified muscles, fascia and tendons at wrist and hand level, left hand, initial encounter: Secondary | ICD-10-CM | POA: Diagnosis not present

## 2021-01-12 MED FILL — HUMIRA PEN CITRATE FREE 40 MG/0.4 ML: SUBCUTANEOUS | 28 days supply | Qty: 4 | Fill #4

## 2021-01-14 DIAGNOSIS — Z1152 Encounter for screening for COVID-19: Secondary | ICD-10-CM | POA: Diagnosis not present

## 2021-01-28 DIAGNOSIS — Z1152 Encounter for screening for COVID-19: Secondary | ICD-10-CM | POA: Diagnosis not present

## 2021-02-05 DIAGNOSIS — S66812A Strain of other specified muscles, fascia and tendons at wrist and hand level, left hand, initial encounter: Secondary | ICD-10-CM | POA: Diagnosis not present

## 2021-02-05 DIAGNOSIS — M25632 Stiffness of left wrist, not elsewhere classified: Secondary | ICD-10-CM | POA: Diagnosis not present

## 2021-02-09 NOTE — Unmapped (Signed)
[facility] Refill Coordination Note    Specialty Medication(s) to be Shipped:   Inflammatory Disorders: Humira    Other medication(s) to be shipped: No additional medications requested for fill at this time     Jasmine Mitchell, DOB: 11/30/1970  Phone: [phone] (home)       All above HIPAA information was verified with patient.     Was a Nurse, learning disability used for this call? No    Completed refill call assessment today to schedule patient's medication shipment from the [facility] [phone]).  All relevant notes have been reviewed.     Specialty medication(s) and dose(s) confirmed: Regimen is correct and unchanged.   Changes to medications: Djuana reports no changes at this time.  Changes to insurance: No  New side effects reported not previously addressed with a pharmacist or physician: None reported  Questions for the pharmacist: No    Confirmed patient received a Conservation officer, historic buildings and a Surveyor, mining with first shipment. The patient will receive a drug information handout for each medication shipped and additional FDA Medication Guides as required.       DISEASE/MEDICATION-SPECIFIC INFORMATION        For patients on injectable medications: Patient currently has 1 doses left.  Next injection is scheduled for 02/12/21.    SPECIALTY MEDICATION ADHERENCE     Medication Adherence    Patient reported X missed doses in the last month: 0  Specialty Medication: HUMIRA(CF) PEN 40 mg/0.4 mL  Patient is on additional specialty medications: No  Patient is on more than two specialty medications: No  Any gaps in refill history greater than 2 weeks in the last 3 months: no  Demonstrates understanding of importance of adherence: yes  Informant: patient  Reliability of informant: reliable  Confirmed plan for next specialty medication refill: delivery by pharmacy  Refills needed for supportive medications: not needed              Were doses missed due to medication being on hold? No    HUMIRA(CF) PEN 40 mg/0.4 mL : 7 days of medicine on hand        REFERRAL TO PHARMACIST     Referral to the pharmacist: Not needed      [facility]     Shipping address confirmed in Epic.     Delivery Scheduled: Yes, Expected medication delivery date: 02/17/21.     Medication will be delivered via UPS to the prescription address in Epic WAM.    Yolonda Kida   [facility] Specialty Technician

## 2021-02-12 DIAGNOSIS — Z1152 Encounter for screening for COVID-19: Secondary | ICD-10-CM | POA: Diagnosis not present

## 2021-02-16 MED FILL — HUMIRA PEN CITRATE FREE 40 MG/0.4 ML: SUBCUTANEOUS | 28 days supply | Qty: 4 | Fill #5

## 2021-02-23 NOTE — Unmapped (Signed)
Patient called stating Dr Raphael Gibney requested to see her in august (6 month follow up) but she called to schedule and hs next available is January.     Called pt to discuss but she did not answer. Sent mychart msg

## 2021-02-24 DIAGNOSIS — Z1152 Encounter for screening for COVID-19: Secondary | ICD-10-CM | POA: Diagnosis not present

## 2021-02-25 DIAGNOSIS — M25632 Stiffness of left wrist, not elsewhere classified: Secondary | ICD-10-CM | POA: Diagnosis not present

## 2021-02-25 DIAGNOSIS — S66812A Strain of other specified muscles, fascia and tendons at wrist and hand level, left hand, initial encounter: Secondary | ICD-10-CM | POA: Diagnosis not present

## 2021-02-25 DIAGNOSIS — Z789 Other specified health status: Secondary | ICD-10-CM | POA: Diagnosis not present

## 2021-03-02 ENCOUNTER — Other Ambulatory Visit (HOSPITAL_COMMUNITY)
Admission: RE | Admit: 2021-03-02 | Discharge: 2021-03-02 | Disposition: A | Discharge status: Home / Self Care | Payer: BC Managed Care – PPO | Source: Ambulatory Visit | Attending: Obstetrics | Admitting: Obstetrics

## 2021-03-02 ENCOUNTER — Other Ambulatory Visit: Payer: Self-pay

## 2021-03-02 ENCOUNTER — Ambulatory Visit (INDEPENDENT_AMBULATORY_CARE_PROVIDER_SITE_OTHER): Payer: BC Managed Care – PPO | Admitting: Obstetrics

## 2021-03-02 ENCOUNTER — Encounter: Payer: Self-pay | Admitting: Obstetrics

## 2021-03-02 VITALS — BP 111/81 | HR 86 | Ht 65.0 in | Wt 234.0 lb

## 2021-03-02 DIAGNOSIS — B373 Candidiasis of vulva and vagina: Secondary | ICD-10-CM

## 2021-03-02 DIAGNOSIS — R6882 Decreased libido: Secondary | ICD-10-CM

## 2021-03-02 DIAGNOSIS — Z780 Asymptomatic menopausal state: Secondary | ICD-10-CM | POA: Diagnosis not present

## 2021-03-02 DIAGNOSIS — N950 Postmenopausal bleeding: Secondary | ICD-10-CM

## 2021-03-02 DIAGNOSIS — Z01419 Encounter for gynecological examination (general) (routine) without abnormal findings: Secondary | ICD-10-CM | POA: Diagnosis not present

## 2021-03-02 DIAGNOSIS — N898 Other specified noninflammatory disorders of vagina: Secondary | ICD-10-CM | POA: Diagnosis not present

## 2021-03-02 DIAGNOSIS — N3001 Acute cystitis with hematuria: Secondary | ICD-10-CM

## 2021-03-02 DIAGNOSIS — Z1211 Encounter for screening for malignant neoplasm of colon: Secondary | ICD-10-CM

## 2021-03-02 DIAGNOSIS — Z1239 Encounter for other screening for malignant neoplasm of breast: Secondary | ICD-10-CM

## 2021-03-02 DIAGNOSIS — Z0000 Encounter for general adult medical examination without abnormal findings: Secondary | ICD-10-CM

## 2021-03-02 DIAGNOSIS — B3731 Acute candidiasis of vulva and vagina: Secondary | ICD-10-CM

## 2021-03-02 LAB — POCT URINALYSIS DIPSTICK
Bilirubin, UA: NEGATIVE
Glucose, UA: NEGATIVE
Ketones, UA: NEGATIVE
Leukocytes, UA: NEGATIVE
Nitrite, UA: NEGATIVE
Odor: NEGATIVE
Protein, UA: NEGATIVE
Spec Grav, UA: 1.02 (ref 1.010–1.025)
Urobilinogen, UA: 0.2 E.U./dL
pH, UA: 7 (ref 5.0–8.0)

## 2021-03-02 MED ORDER — FLUCONAZOLE 150 MG PO TABS: 150.0000 mg | ORAL_TABLET | Freq: Once | ORAL | 4 refills | Status: AC

## 2021-03-02 MED ORDER — M-NATAL PLUS 27-1 MG PO TABS
ORAL_TABLET | ORAL | 11 refills | Status: DC
Start: 2021-03-02 — End: 2021-10-06

## 2021-03-02 MED ORDER — NITROFURANTOIN MONOHYD MACRO 100 MG PO CAPS: 100.0000 mg | ORAL_CAPSULE | Freq: Two times a day (BID) | ORAL | 2 refills | Status: DC

## 2021-03-02 NOTE — Progress Notes (Signed)
Subjective:        Katrina Grant is a 50 y.o. female here for a routine exam.  Current complaints: Vaginal discharge with irritation.  Also c/o burning with urination. . Had a normal period recently after 2 years of amenorrhea, with all the menopausal vasomotor symptoms.  Personal health questionnaire:  Is patient Ashkenazi Jewish, have a family history of breast and/or ovarian cancer: no Is there a family history of uterine cancer diagnosed at age < 54, gastrointestinal cancer, urinary tract cancer, family member who is a Field seismologist syndrome-associated carrier: no Is the patient overweight and hypertensive, family history of diabetes, personal history of gestational diabetes, preeclampsia or PCOS: yes Is patient over 47, have PCOS,  family history of premature CHD under age 33, diabetes, smoke, have hypertension or peripheral artery disease:  no At any time, has a partner hit, kicked or otherwise hurt or frightened you?: no Over the past 2 weeks, have you felt down, depressed or hopeless?: no Over the past 2 weeks, have you felt little interest or pleasure in doing things?:no   Gynecologic History No LMP recorded. (Menstrual status: Irregular Periods). Contraception: post menopausal status Last Pap: 07-18-2019. Results were: normal Last mammogram: 08-28-2019. Results were: normal  Obstetric History OB History  Gravida Para Term Preterm AB Living  2 2 2     2   SAB IAB Ectopic Multiple Live Births               # Outcome Date GA Lbr Len/2nd Weight Sex Delivery Anes PTL Lv  2 Term           1 Term             Past Medical History:  Diagnosis Date   ADD (attention deficit disorder)    Anxiety    Cholesterol serum elevated    on medication in the past   Crohn's disease of colon with fistula ([place])    sees Dr. Coralyn Mark, [facility]   Depression    Fibromyalgia - managed by Dr. Dossie Der, [facility] - takes Neurontin and Tramadol 11/20/2013   Frequent headaches    GERD (gastroesophageal  reflux disease)    Osteoarthritis    knee, spine   Pneumonia    Syncope     Past Surgical History:  Procedure Laterality Date   anal fistula repair      X 4    colonoscopy with dilation     X 2    FRACTURE SURGERY     REPAIR EXTENSOR TENDON Left 12/31/2020   Procedure: ABDUCTOR POLLICIS LONGUS RECONSTRUCTION WITH ALLOGRAFT STABILIZATION; EXCISION SPUR ULNA;  Surgeon: Daryll Brod, MD;  Location: [place];  Service: Orthopedics;  Laterality: Left;  AXILLARY BLOCK   TENDON REPAIR Left 12/31/2020   Procedure: REPAIR EXTENSOR CARPI  ULNARIS WITH TENDON GRAFT;;  Surgeon: Daryll Brod, MD;  Location: [place];  Service: Orthopedics;  Laterality: Left;   TUBAL LIGATION       Current Outpatient Medications:    acetaminophen (TYLENOL) 500 MG tablet, Take 1,000 mg by mouth every 6 (six) hours as needed. For migraines and stomach upset, Disp: , Rfl:    Adalimumab 40 MG/0.8ML PSKT, Inject 40 mg into the skin once a week., Disp: , Rfl:    buPROPion (WELLBUTRIN XL) 300 MG 24 hr tablet, TAKE 1 TABLET (300 MG TOTAL) BY MOUTH DAILY AFTER BREAKFAST., Disp: 90 tablet, Rfl: 4   fluconazole (DIFLUCAN) 150 MG tablet, Take 1 tablet (150  mg total) by mouth once for 1 dose., Disp: 1 tablet, Rfl: 4   nitrofurantoin, macrocrystal-monohydrate, (MACROBID) 100 MG capsule, Take 1 capsule (100 mg total) by mouth 2 (two) times daily. 1 po BID x 7days, Disp: 14 capsule, Rfl: 2   ondansetron (ZOFRAN ODT) 8 MG disintegrating tablet, Take 1 tablet (8 mg total) by mouth every 8 (eight) hours as needed for nausea or vomiting., Disp: 30 tablet, Rfl: 0   traMADol (ULTRAM) 50 MG tablet, Take 1 tablet (50 mg total) by mouth 3 (three) times daily as needed., Disp: 60 tablet, Rfl: 1   ACCU-CHEK FASTCLIX LANCETS MISC, Use as directed to check blood sugar once day (Patient not taking: Reported on 03/02/2021), Disp: 102 each, Rfl: 0   acetaminophen-codeine (TYLENOL #3) 300-30 MG tablet, Take 1 tablet by  mouth every 8 (eight) hours as needed for moderate pain., Disp: 30 tablet, Rfl: 0   desipramine (NORPRAMIN) 25 MG tablet, Take by mouth 3 (three) times daily. , Disp: , Rfl:    desipramine (NORPRAMIN) 25 MG tablet, Take by mouth., Disp: , Rfl:    dicyclomine (BENTYL) 10 MG capsule, Take 10 mg by mouth 3 (three) times daily. (Patient not taking: Reported on 03/02/2021), Disp: , Rfl:    fluticasone (FLONASE) 50 MCG/ACT nasal spray, PLACE 1 SPRAY INTO BOTH NOSTRILS DAILY. PATIENT NEEDS AN APPOINTMENT (Patient not taking: Reported on 03/02/2021), Disp: 16 mL, Rfl: 5   glucose blood (ACCU-CHEK GUIDE) test strip, Use as instructed (Patient not taking: Reported on 03/02/2021), Disp: 100 each, Rfl: 1   Prenatal Vit-Fe Fumarate-FA (M-NATAL PLUS) 27-1 MG TABS, TAKE 1 TABLET BY MOUTH EVERY DAY BEFORE BREAKFAST, Disp: 30 tablet, Rfl: 11 Allergies  Allergen Reactions   Percocet [Oxycodone-Acetaminophen] Nausea And Vomiting    Social History   Tobacco Use   Smoking status: Never   Smokeless tobacco: Never  Substance Use Topics   Alcohol use: Not Currently    Alcohol/week: 0.0 standard drinks    Family History  Problem Relation Age of Onset   Hypertension Mother    ADD / ADHD Mother    Anxiety disorder Mother    Arthritis Mother    Depression Mother    Varicose Veins Mother    Diabetes Father    Hearing loss Father    Heart disease Father    Cancer Sister        cervical   ADD / ADHD Sister    Anxiety disorder Sister    Arthritis Sister    Depression Sister    Early death Sister    Varicose Veins Sister    Mental illness Brother        drugs and alcohol abuse   ADD / ADHD Brother    Alcohol abuse Brother    Asthma Brother    Depression Brother    Drug abuse Brother    ADD / ADHD Daughter       Review of Systems  Constitutional: negative for fatigue and weight loss Respiratory: negative for cough and wheezing Cardiovascular: negative for chest pain, fatigue and  palpitations Gastrointestinal: negative for abdominal pain and change in bowel habits Musculoskeletal:negative for myalgias Neurological: negative for gait problems and tremors Behavioral/Psych: negative for abusive relationship, depression Endocrine: negative for temperature intolerance    Genitourinary:positive for postmenopausal normal menstrual period, hot flashes,decreased libido, and burning with urination Integument/breast: negative for breast lump, breast tenderness, nipple discharge and skin lesion(s)    Objective:       BP 111/81  Pulse 86   Ht 5' 5"  (1.651 m)   Wt 234 lb (106.1 kg)   BMI 38.94 kg/m  General:   Alert and no distress  Skin:   no rash or abnormalities  Lungs:   clear to auscultation bilaterally  Heart:   regular rate and rhythm, S1, S2 normal, no murmur, click, rub or gallop  Breasts:   normal without suspicious masses, skin or nipple changes or axillary nodes  Abdomen:  normal findings: no organomegaly, soft, non-tender and no hernia  Pelvis:  External genitalia: normal general appearance Urinary system: urethral meatus normal and bladder without fullness, nontender Vaginal: normal without tenderness, induration or masses Cervix: normal appearance Adnexa: normal bimanual exam Uterus: anteverted and non-tender, normal size   Lab Review Urine pregnancy test Labs reviewed yes Radiologic studies reviewed yes  I have spent a total of 20 minutes of face-to-face and non-face-to-face time, excluding clinical staff time, reviewing notes and preparing to see patient, ordering tests and/or medications, and counseling the patient.   Assessment:    1. Encounter for gynecological examination with Papanicolaou smear of cervix Rx: - Cytology - PAP( [place])  2. Menopause Rx: - DG BONE DENSITY (DXA); Future  3. Postmenopausal bleeding Rx: - US PELVIC COMPLETE WITH TRANSVAGINAL; Future  4. Decreased libido without sexual dysfunction - will try some  of the natural herbal remedies   5. Vaginal discharge Rx: - Cervicovaginal ancillary only  6. Candida vaginitis Rx: - fluconazole (DIFLUCAN) 150 MG tablet; Take 1 tablet (150 mg total) by mouth once for 1 dose.  Dispense: 1 tablet; Refill: 4  7. Acute cystitis with hematuria Rx: - Urine Culture - POCT Urinalysis Dipstick - nitrofurantoin, macrocrystal-monohydrate, (MACROBID) 100 MG capsule; Take 1 capsule (100 mg total) by mouth 2 (two) times daily. 1 po BID x 7days  Dispense: 14 capsule; Refill: 2  8. Screening breast examination Rx: - MM Digital Screening; Future  9. Screening for colon cancer Rx: - DG BONE DENSITY (DXA); Future  10. Routine adult health maintenance Rx: - Prenatal Vit-Fe Fumarate-FA (M-NATAL PLUS) 27-1 MG TABS; TAKE 1 TABLET BY MOUTH EVERY DAY BEFORE BREAKFAST  Dispense: 30 tablet; Refill: 11    Plan:    Education reviewed: calcium supplements, depression evaluation, low fat, low cholesterol diet, safe sex/STD prevention, self breast exams, and weight bearing exercise. Mammogram ordered. Follow up in: 2 weeks. Bone Density Study ordered   Pelvic ultrasound ordered  Meds ordered this encounter  Medications   nitrofurantoin, macrocrystal-monohydrate, (MACROBID) 100 MG capsule    Sig: Take 1 capsule (100 mg total) by mouth 2 (two) times daily. 1 po BID x 7days    Dispense:  14 capsule    Refill:  2   Prenatal Vit-Fe Fumarate-FA (M-NATAL PLUS) 27-1 MG TABS    Sig: TAKE 1 TABLET BY MOUTH EVERY DAY BEFORE BREAKFAST    Dispense:  30 tablet    Refill:  11   fluconazole (DIFLUCAN) 150 MG tablet    Sig: Take 1 tablet (150 mg total) by mouth once for 1 dose.    Dispense:  1 tablet    Refill:  4   Orders Placed This Encounter  Procedures   Urine Culture   DG BONE DENSITY (DXA)    Standing Status:   Future    Standing Expiration Date:   03/02/2022    Order Specific Question:   Reason for Exam (SYMPTOM  OR DIAGNOSIS REQUIRED)    Answer:    Hypoestrogenism.  Menopause  Order Specific Question:   Is the patient pregnant?    Answer:   No    Order Specific Question:   Preferred imaging location?    Answer:   [facility]   US PELVIC COMPLETE WITH TRANSVAGINAL    Standing Status:   Future    Standing Expiration Date:   03/02/2022    Order Specific Question:   Reason for Exam (SYMPTOM  OR DIAGNOSIS REQUIRED)    Answer:   postmenopausal vaginal bleeding.    Order Specific Question:   Preferred imaging location?    Answer:   [facility]   MM Digital Screening    Standing Status:   Future    Standing Expiration Date:   03/02/2022    Order Specific Question:   Reason for Exam (SYMPTOM  OR DIAGNOSIS REQUIRED)    Answer:   Screening    Order Specific Question:   Is the patient pregnant?    Answer:   No    Order Specific Question:   Preferred imaging location?    Answer:   [facility]   POCT Urinalysis Dipstick     Shelly Bombard, MD 03/02/2021 2:41 PM

## 2021-03-02 NOTE — Progress Notes (Signed)
Pt is in the office for annual Last pap 07-18-2019 Last mammogram 08/28/2019 Reports burning with urination and pressure, advised to leave sample. Pt is unsure if she has gone through menopause because last cycle was in may, previously had not had one in 2 years.

## 2021-03-03 DIAGNOSIS — Z789 Other specified health status: Secondary | ICD-10-CM | POA: Diagnosis not present

## 2021-03-03 DIAGNOSIS — M25632 Stiffness of left wrist, not elsewhere classified: Secondary | ICD-10-CM | POA: Diagnosis not present

## 2021-03-05 LAB — CYTOLOGY - PAP
Comment: NEGATIVE
Diagnosis: NEGATIVE
High risk HPV: NEGATIVE

## 2021-03-05 LAB — CERVICOVAGINAL ANCILLARY ONLY
Bacterial Vaginitis (gardnerella): POSITIVE — AB
Candida Glabrata: NEGATIVE
Candida Vaginitis: NEGATIVE
Chlamydia: NEGATIVE
Comment: NEGATIVE
Comment: NEGATIVE
Comment: NEGATIVE
Comment: NEGATIVE
Comment: NEGATIVE
Comment: NORMAL
Neisseria Gonorrhea: NEGATIVE
Trichomonas: NEGATIVE

## 2021-03-06 ENCOUNTER — Other Ambulatory Visit: Payer: Self-pay | Admitting: Obstetrics

## 2021-03-06 DIAGNOSIS — B9689 Other specified bacterial agents as the cause of diseases classified elsewhere: Secondary | ICD-10-CM

## 2021-03-06 DIAGNOSIS — N760 Acute vaginitis: Secondary | ICD-10-CM

## 2021-03-06 MED ORDER — METRONIDAZOLE 500 MG PO TABS: 500.0000 mg | ORAL_TABLET | Freq: Two times a day (BID) | ORAL | 2 refills | Status: DC

## 2021-03-08 DIAGNOSIS — M25632 Stiffness of left wrist, not elsewhere classified: Secondary | ICD-10-CM | POA: Diagnosis not present

## 2021-03-08 DIAGNOSIS — S66812D Strain of other specified muscles, fascia and tendons at wrist and hand level, left hand, subsequent encounter: Secondary | ICD-10-CM | POA: Diagnosis not present

## 2021-03-08 DIAGNOSIS — Z789 Other specified health status: Secondary | ICD-10-CM | POA: Diagnosis not present

## 2021-03-11 ENCOUNTER — Ambulatory Visit (HOSPITAL_BASED_OUTPATIENT_CLINIC_OR_DEPARTMENT_OTHER)
Admission: RE | Admit: 2021-03-11 | Discharge: 2021-03-11 | Disposition: A | Discharge status: Home / Self Care | Payer: BC Managed Care – PPO | Source: Ambulatory Visit | Attending: Obstetrics | Admitting: Obstetrics

## 2021-03-11 ENCOUNTER — Other Ambulatory Visit: Payer: Self-pay

## 2021-03-11 DIAGNOSIS — Z1152 Encounter for screening for COVID-19: Secondary | ICD-10-CM | POA: Diagnosis not present

## 2021-03-11 DIAGNOSIS — Z1211 Encounter for screening for malignant neoplasm of colon: Secondary | ICD-10-CM

## 2021-03-11 DIAGNOSIS — Z1239 Encounter for other screening for malignant neoplasm of breast: Secondary | ICD-10-CM | POA: Diagnosis not present

## 2021-03-11 DIAGNOSIS — Z780 Asymptomatic menopausal state: Secondary | ICD-10-CM

## 2021-03-11 DIAGNOSIS — N950 Postmenopausal bleeding: Secondary | ICD-10-CM | POA: Insufficient documentation

## 2021-03-11 DIAGNOSIS — Z1231 Encounter for screening mammogram for malignant neoplasm of breast: Secondary | ICD-10-CM | POA: Diagnosis not present

## 2021-03-12 DIAGNOSIS — M25632 Stiffness of left wrist, not elsewhere classified: Secondary | ICD-10-CM | POA: Diagnosis not present

## 2021-03-12 DIAGNOSIS — Z789 Other specified health status: Secondary | ICD-10-CM | POA: Diagnosis not present

## 2021-03-15 MED FILL — HUMIRA PEN CITRATE FREE 40 MG/0.4 ML: SUBCUTANEOUS | 28 days supply | Qty: 4 | Fill #6

## 2021-03-15 NOTE — Unmapped (Addendum)
[facility] Clinical Assessment & Refill Coordination Note    Jasmine Mitchell, [place]: 11-Nov-1970   Phone: [phone] (home)     All above HIPAA information was verified with patient.     Was a Nurse, learning disability used for this call? No    Specialty Medication(s):   Inflammatory Disorders: Humira     Current Outpatient Medications   Medication Sig Dispense Refill   ??? acetaminophen (TYLENOL) 500 MG tablet Take 1,000 mg by mouth every four (4) hours as needed for pain.     ??? buPROPion (WELLBUTRIN XL) 150 MG 24 hr tablet Take 150 mg by mouth daily.     ??? desipramine (NORPRAMIN) 25 MG tablet Take 4 tablets (100 mg total) by mouth nightly. 360 tablet 0   ??? fluticasone (FLONASE) 50 mcg/actuation nasal spray 1 SPRAY BY EACH NARE ROUTE DAILY. AS DIRECTED (Patient taking differently: 1 spray by Each Nare route daily as needed. As directed) 16 mL 2   ??? HUMIRA PEN CITRATE FREE 40 MG/0.4 ML Inject the contents of 1 pen (40 mg total) under the skin once a week. 4 each 6   ??? loratadine (CLARITIN) 10 mg tablet Take 10 mg by mouth once as needed.      ??? MULTIVIT WITH CALCIUM,IRON,MIN (WOMEN'S DAILY MULTIVITAMIN ORAL) Take 1 tablet by mouth once daily.     ??? ondansetron (ZOFRAN-ODT) 8 MG disintegrating tablet Take 8 mg by mouth every eight (8) hours as needed.      ??? predniSONE (DELTASONE) 10 mg tablet pack Take by mouth daily. 6 day dose pack for hurt arm.     ??? sulfamethoxazole-trimethoprim (BACTRIM DS) 800-160 mg per tablet Take 1 tablet by mouth. (Patient not taking: Reported on 09/25/2020)       No current facility-administered medications for this visit.        Changes to medications: Jasmine Mitchell reports no changes at this time.    Allergies   Allergen Reactions   ??? Oxycodone (Bulk) Nausea And Vomiting     Patient tolerates acetaminophen well       Changes to allergies: No    SPECIALTY MEDICATION ADHERENCE     Humira 40 mg/0.69ml: 0 days of medicine on hand       Medication Adherence    Patient reported X missed doses in the last month: 1  Specialty Medication: Humira 40mg /0.55ml  Patient is on additional specialty medications: No          Specialty medication(s) dose(s) confirmed: Regimen is correct and unchanged.     Are there any concerns with adherence? No    Adherence counseling provided? Not needed    CLINICAL MANAGEMENT AND INTERVENTION      Clinical Benefit Assessment:    Do you feel the medicine is effective or helping your condition? Yes    Clinical Benefit counseling provided? Not needed    Adverse Effects Assessment:    Are you experiencing any side effects? No    Are you experiencing difficulty administering your medicine? No    Quality of Life Assessment:         How many days over the past month did your Crohn's Disease  keep you from your normal activities? For example, brushing your teeth or getting up in the morning. 0    Have you discussed this with your provider? Not needed    Acute Infection Status:    Acute infections noted within Epic:  No active infections  Patient reported infection:  None    Therapy Appropriateness:    Is therapy appropriate and patient progressing towards therapeutic goals? Yes, therapy is appropriate and should be continued    DISEASE/MEDICATION-SPECIFIC INFORMATION      For patients on injectable medications: Patient currently has 0 doses left.  Next injection is scheduled for 03/12/21.    PATIENT SPECIFIC NEEDS     - Does the patient have any physical, cognitive, or cultural barriers? No    - Is the patient high risk? No    - Does the patient require a Care Management Plan? No     - Does the patient require physician intervention or other additional services (i.e. nutrition, smoking cessation, social work)? No      SHIPPING     Specialty Medication(s) to be Shipped:   Inflammatory Disorders: Humira    Other medication(s) to be shipped: No additional medications requested for fill at this time     Changes to insurance: No    Delivery Scheduled: Yes, Expected medication delivery date: 03/16/21.     Medication will be delivered via UPS to the confirmed prescription address in [place].    The patient will receive a drug information handout for each medication shipped and additional FDA Medication Guides as required.  Verified that patient has previously received a Conservation officer, historic buildings and a Surveyor, mining.    The patient or caregiver noted above participated in the development of this care plan and knows that they can request review of or adjustments to the care plan at any time.      All of the patient's questions and concerns have been addressed.    Tera Helper   [facility] Specialty Pharmacist

## 2021-03-17 DIAGNOSIS — Z789 Other specified health status: Secondary | ICD-10-CM | POA: Diagnosis not present

## 2021-03-17 DIAGNOSIS — M25632 Stiffness of left wrist, not elsewhere classified: Secondary | ICD-10-CM | POA: Diagnosis not present

## 2021-03-23 ENCOUNTER — Telehealth (INDEPENDENT_AMBULATORY_CARE_PROVIDER_SITE_OTHER): Payer: BC Managed Care – PPO | Admitting: Obstetrics

## 2021-03-23 ENCOUNTER — Encounter: Payer: Self-pay | Admitting: Obstetrics

## 2021-03-23 DIAGNOSIS — N950 Postmenopausal bleeding: Secondary | ICD-10-CM | POA: Diagnosis not present

## 2021-03-23 NOTE — Progress Notes (Signed)
GYNECOLOGY VIRTUAL VISIT ENCOUNTER NOTE  Provider location: [facility] [place] [facility]   Patient location: Home  I connected with Katrina Grant on 03/23/21 at  1:30 PM EDT by MyChart Video Encounter and verified that I am speaking with the correct person using two identifiers.   I discussed the limitations, risks, security and privacy concerns of performing an evaluation and management service virtually and the availability of in person appointments. I also discussed with the patient that there may be a patient responsible charge related to this service. The patient expressed understanding and agreed to proceed.   History:  Katrina Grant is a 49 y.o. G27P2002 female being evaluated today for an episode of a normal period 2 years after menopause.  No further vaginal bleeding since.then  Ultrasound done and she presents virtually today for results of ulyrasound. She denies any abnormal vaginal discharge, bleeding, pelvic pain or other concerns.       Past Medical History:  Diagnosis Date   ADD (attention deficit disorder)    Anxiety    Cholesterol serum elevated    on medication in the past   Crohn's disease of colon with fistula [facility])    sees Dr. Coralyn Mark, [facility]   Depression    Fibromyalgia - managed by Dr. Dossie Der, [facility] - takes Neurontin and Tramadol 11/20/2013   Frequent headaches    GERD (gastroesophageal reflux disease)    Osteoarthritis    knee, spine   Pneumonia    Syncope    Past Surgical History:  Procedure Laterality Date   anal fistula repair      X 4    colonoscopy with dilation     X 2    FRACTURE SURGERY     REPAIR EXTENSOR TENDON Left 12/31/2020   Procedure: ABDUCTOR POLLICIS LONGUS RECONSTRUCTION WITH ALLOGRAFT STABILIZATION; EXCISION SPUR ULNA;  Surgeon: Daryll Brod, MD;  Location: [place];  Service: Orthopedics;  Laterality: Left;  AXILLARY BLOCK   TENDON REPAIR Left 12/31/2020   Procedure: REPAIR EXTENSOR CARPI   ULNARIS WITH TENDON GRAFT;;  Surgeon: Daryll Brod, MD;  Location: [place];  Service: Orthopedics;  Laterality: Left;   TUBAL LIGATION     The following portions of the patient's history were reviewed and updated as appropriate: allergies, current medications, past family history, past medical history, past social history, past surgical history and problem list.   Health Maintenance:  Normal pap and negative HRHPV on 03-02-2021.  Normal mammogram on 03-11-2021.   Review of Systems:  Pertinent items noted in HPI and remainder of comprehensive ROS otherwise negative.  Physical Exam:   General:  Alert, oriented and cooperative. Patient appears to be in no acute distress.  Mental Status: Normal mood and affect. Normal behavior. Normal judgment and thought content.   Respiratory: Normal respiratory effort, no problems with respiration noted  Rest of physical exam deferred due to type of encounter  Labs and Imaging No results found for this or any previous visit (from the past 336 hour(s)). DG BONE DENSITY (DXA)  Result Date: 03/11/2021 EXAM: DUAL X-RAY ABSORPTIOMETRY (DXA) FOR BONE MINERAL DENSITY IMPRESSION: Referring Physician:  Shelly Bombard Your patient completed a bone mineral density test using GE Lunar iDXA system (analysis version: 16). Technologist: ALW PATIENT: Name: Katrina, Grant Patient ID: [id number] Birth Date: 1971-03-07 Height: 65.0 in. Sex: Female Measured: 03/11/2021 Weight: 234.0 lbs. Indications: Caucasian, Chrons Disease, Estrogen Deficiency, Humara, Post Menopausal, Wellbutrin Fractures: Wrist Treatments: ASSESSMENT:  The BMD measured at AP Spine L1-L4 is 1.107 g/cm2 with a T-score of -0.7. This patient is considered normal according to [facility]) criteria. The scan quality is good. Site Region Measured Date Measured Age YA BMD Significant CHANGE T-score AP Spine  L1-L4      03/11/2021    50.7         -0.7    1.107 g/cm2 DualFemur Neck  Right 03/11/2021    50.7         -0.5    0.966 g/cm2 DualFemur Total Mean 03/11/2021    50.7         -0.3    0.975 g/cm2 World Health Organization [facility]) criteria for post-menopausal, Caucasian Women: Normal       T-score at or above -1 SD Osteopenia   T-score between -1 and -2.5 SD Osteoporosis T-score at or below -2.5 SD RECOMMENDATION: 1. All patients should optimize calcium and vitamin D intake. 2. Consider FDA approved medical therapies in postmenopausal women and men aged 2 years and older, based on the following: a. A hip or vertebral (clinical or morphometric) fracture b. T-score = -2.5 at the femoral neck or spine after appropriate evaluation to exclude secondary causes c. Low bone mass (T-score between -1.0 and -2.5 at the femoral neck or spine) and a 10- year probability of a hip fracture = 3% or a 10 year probability of a major osteoporosis-related fracture = 20% based on the [place]-adapted WHO algorithm. 3. Clinician judgement and/or patient preference may indicate treatment for people with10-year fracture probabilities above or below these levels. FOLLOW-UP: Patients with diagnosis of osteoporosis or at high risk for fracture should have regular bone mineral density tests. For patients eligible for Medicare routine testing is allowed once every 2 years. The testing frequency can be increased to one year for patients who have rapidly progressing disease, those who are receiving or discontinuing medical therapy to restore bone mass, or have additional risk factors. I have reviewed this study and agree with the findings. [facility] Electronically Signed   By: Rolm Baptise M.D.   On: 03/11/2021 13:43   MM 3D SCREEN BREAST BILATERAL  Result Date: 03/17/2021 CLINICAL DATA:  Screening. EXAM: DIGITAL SCREENING BILATERAL MAMMOGRAM WITH TOMOSYNTHESIS AND CAD TECHNIQUE: Bilateral screening digital craniocaudal and mediolateral oblique mammograms were obtained. Bilateral screening digital breast  tomosynthesis was performed. The images were evaluated with computer-aided detection. COMPARISON:  Previous exam(s). ACR Breast Density Category b: There are scattered areas of fibroglandular density. FINDINGS: There are no findings suspicious for malignancy. IMPRESSION: No mammographic evidence of malignancy. A result letter of this screening mammogram will be mailed directly to the patient. RECOMMENDATION: Screening mammogram in one year. (Code:SM-B-01Y) BI-RADS CATEGORY  1: Negative. Electronically Signed   By: Lajean Manes M.D.   On: 03/17/2021 08:51   US PELVIC COMPLETE WITH TRANSVAGINAL  Result Date: 03/11/2021 CLINICAL DATA:  Postmenopausal vaginal bleeding which lasted 8 days back in July EXAM: TRANSABDOMINAL AND TRANSVAGINAL ULTRASOUND OF PELVIS TECHNIQUE: Both transabdominal and transvaginal ultrasound examinations of the pelvis were performed. Transabdominal technique was performed for global imaging of the pelvis including uterus, ovaries, adnexal regions, and pelvic cul-de-sac. It was necessary to proceed with endovaginal exam following the transabdominal exam to visualize the uterus, endometrium, and ovaries. COMPARISON:  03/12/2011 FINDINGS: Uterus Measurements: 7.6 x 3.2 x 5.1 cm = volume: 65 mL. Anteverted. Normal morphology without mass Endometrium Thickness: 2 mm.  No endometrial fluid or focal abnormality Right ovary  Measurements: 1.8 x 0.9 x 1.8 cm = volume: 1.4 mL. Normal morphology without mass Left ovary Measurements: 1.6 x 0.8 x 1.2 cm = volume: 0.9 mL. Normal morphology without mass Other findings No free pelvic fluid.  No adnexal masses. IMPRESSION: Normal exam. Electronically Signed   By: Lavonia Dana M.D.   On: 03/11/2021 15:29       Assessment and Plan:     1. Postmenopausal bleeding - ultrasound is WNL's with a 2 mm uterine stripe.  Will follow clinically for new onset vaginal bleeding, and will thwn get an endometrial biopsy    Follow up in 6 months, or sooner if vaginal  bleeding reoccurs.   I discussed the assessment and treatment plan with the patient. The patient was provided an opportunity to ask questions and all were answered. The patient agreed with the plan and demonstrated an understanding of the instructions.   The patient was advised to call back or seek an in-person evaluation/go to the ED if the symptoms worsen or if the condition fails to improve as anticipated.  I have spent a total of 15 minutes of non-face-to-face time, excluding clinical staff time, reviewing notes and preparing to see patient, ordering tests and/or medications, and counseling the patient.    Baltazar Najjar, MD [facility]  03/23/21

## 2021-03-26 DIAGNOSIS — M25632 Stiffness of left wrist, not elsewhere classified: Secondary | ICD-10-CM | POA: Diagnosis not present

## 2021-03-26 DIAGNOSIS — Z789 Other specified health status: Secondary | ICD-10-CM | POA: Diagnosis not present

## 2021-03-26 DIAGNOSIS — R29898 Other symptoms and signs involving the musculoskeletal system: Secondary | ICD-10-CM | POA: Diagnosis not present

## 2021-03-28 ENCOUNTER — Other Ambulatory Visit: Payer: Self-pay | Admitting: Obstetrics

## 2021-03-28 DIAGNOSIS — F418 Other specified anxiety disorders: Secondary | ICD-10-CM

## 2021-04-01 DIAGNOSIS — Z1152 Encounter for screening for COVID-19: Secondary | ICD-10-CM | POA: Diagnosis not present

## 2021-04-08 DIAGNOSIS — K580 Irritable bowel syndrome with diarrhea: Principal | ICD-10-CM

## 2021-04-08 MED ORDER — DESIPRAMINE 25 MG TABLET
ORAL_TABLET | Freq: Every evening | ORAL | 0 refills | 0 days
Start: 2021-04-08 — End: ?

## 2021-04-09 DIAGNOSIS — Z1152 Encounter for screening for COVID-19: Secondary | ICD-10-CM | POA: Diagnosis not present

## 2021-04-09 MED ORDER — DESIPRAMINE 25 MG TABLET
ORAL_TABLET | Freq: Every evening | ORAL | 0 refills | 90 days | Status: CP
Start: 2021-04-09 — End: ?

## 2021-04-19 NOTE — Unmapped (Signed)
The [facility] has made a third and final attempt to reach this patient to refill the following medication:HUMIRA(CF) PEN 40 mg/0.4 mL.      We have left voicemails on the following phone numbers: [phone] and have sent a MyChart message.    Dates contacted: 04/06/21-04/12/21-04/19/21  Last scheduled delivery: 03/15/21    The patient may be at risk of non-compliance with this medication. The patient should call the [facility] at [phone]  Option 4, then Option 2 (all other specialty patients) to refill medication.    Yolonda Kida   [facility] Specialty Technician

## 2021-04-23 DIAGNOSIS — Z1152 Encounter for screening for COVID-19: Secondary | ICD-10-CM | POA: Diagnosis not present

## 2021-04-29 DIAGNOSIS — K5080 Crohn's disease of both small and large intestine without complications: Principal | ICD-10-CM

## 2021-04-29 MED ORDER — HUMIRA PEN CITRATE FREE 40 MG/0.4 ML
SUBCUTANEOUS | 2 refills | 28 days | Status: CP
Start: 2021-04-29 — End: 2022-04-29
  Filled 2021-05-03: qty 4, 28d supply, fill #0

## 2021-04-29 NOTE — Unmapped (Signed)
Patient is scheduled for clinic visit with Dr Raphael Gibney in January 2023 humira refill sent per protocol

## 2021-04-29 NOTE — Unmapped (Signed)
[facility] Refill Coordination Note    Specialty Medication(s) to be Shipped:   Inflammatory Disorders: Humira    Other medication(s) to be shipped: No additional medications requested for fill at this time     Jasmine Mitchell, DOB: [birth year]  Phone: [phone] (home)       All above HIPAA information was verified with patient.     Was a Nurse, learning disability used for this call? No    Completed refill call assessment today to schedule patient's medication shipment from the [facility] [phone]).  All relevant notes have been reviewed.     Specialty medication(s) and dose(s) confirmed: Regimen is correct and unchanged.   Changes to medications: Aiana reports no changes at this time.  Changes to insurance: No  New side effects reported not previously addressed with a pharmacist or physician: None reported  Questions for the pharmacist: No    Confirmed patient received a Conservation officer, historic buildings and a Surveyor, mining with first shipment. The patient will receive a drug information handout for each medication shipped and additional FDA Medication Guides as required.       DISEASE/MEDICATION-SPECIFIC INFORMATION        For patients on injectable medications: Patient currently has 0 doses left.  Next injection is scheduled for ASAP.    SPECIALTY MEDICATION ADHERENCE     Medication Adherence    Patient reported X missed doses in the last month: 1  Specialty Medication: HUMIRA(CF) PEN 40 mg/0.4 mL  Patient is on additional specialty medications: No        Were doses missed due to medication being on hold? No    REFERRAL TO PHARMACIST     Referral to the pharmacist: Not needed      [facility]     Shipping address confirmed in Epic.     Delivery Scheduled: Yes, Expected medication delivery date: 05/04/2021.  However, Rx request for refills was sent to the provider as there are none remaining.     Medication will be delivered via UPS to the prescription address in Epic WAM.    Lorelei Pont [facility] Specialty Technician

## 2021-05-06 DIAGNOSIS — Z1152 Encounter for screening for COVID-19: Secondary | ICD-10-CM | POA: Diagnosis not present

## 2021-05-24 NOTE — Unmapped (Signed)
The [facility] has made a second and final attempt to reach this patient to refill the following medication: HUMIRA(CF) PEN 40 mg/0.4 mL.      We have left voicemails on the following phone numbers: [phone] and have sent a MyChart message.    Dates contacted: 05/19/21 - 05/24/21  Last scheduled delivery: 05/03/21    The patient may be at risk of non-compliance with this medication. The patient should call the [facility] at [phone]  Option 4, then Option 2 (all other specialty patients) to refill medication.    Yolonda Kida   [facility] Specialty Technician

## 2021-06-02 NOTE — Unmapped (Signed)
[facility] Refill Coordination Note    Specialty Medication(s) to be Shipped:   Inflammatory Disorders: Humira    Other medication(s) to be shipped: No additional medications requested for fill at this time     Jasmine Mitchell, DOB: 09-25-1970  Phone: [phone] (home)       All above HIPAA information was verified with patient.     Was a Nurse, learning disability used for this call? No    Completed refill call assessment today to schedule patient's medication shipment from the [facility] [phone]).  All relevant notes have been reviewed.     Specialty medication(s) and dose(s) confirmed: Regimen is correct and unchanged.   Changes to medications: Diksha reports no changes at this time.  Changes to insurance: No  New side effects reported not previously addressed with a pharmacist or physician: None reported  Questions for the pharmacist: No    Confirmed patient received a Conservation officer, historic buildings and a Surveyor, mining with first shipment. The patient will receive a drug information handout for each medication shipped and additional FDA Medication Guides as required.       DISEASE/MEDICATION-SPECIFIC INFORMATION        For patients on injectable medications: Patient currently has 0 doses left.  Next injection is scheduled for 06/04/21.    SPECIALTY MEDICATION ADHERENCE     Medication Adherence    Patient reported X missed doses in the last month: 0  Specialty Medication: HUMIRA(CF) PEN 40 mg/0.4 mL  Patient is on additional specialty medications: No  Patient is on more than two specialty medications: No              Were doses missed due to medication being on hold? No    Humira (CF) 40mg / 0.72ml: 0 days of medicine on hand       REFERRAL TO PHARMACIST     Referral to the pharmacist: Not needed      [facility]     Shipping address confirmed in Epic.     Delivery Scheduled: Yes, Expected medication delivery date: 06/04/21.     Medication will be delivered via UPS to the prescription address in Epic WAM.    Nancy Nordmann [facility] Specialty Technician

## 2021-06-03 MED FILL — HUMIRA PEN CITRATE FREE 40 MG/0.4 ML: SUBCUTANEOUS | 28 days supply | Qty: 4 | Fill #1

## 2021-06-24 NOTE — Unmapped (Signed)
[facility] Refill Coordination Note    Specialty Medication(s) to be Shipped:   Inflammatory Disorders: Humira    Other medication(s) to be shipped: No additional medications requested for fill at this time     Jasmine Mitchell, DOB: [birth year]  Phone: [phone] (home)       All above HIPAA information was verified with patient.     Was a Nurse, learning disability used for this call? No    Completed refill call assessment today to schedule patient's medication shipment from the [facility] [phone]).  All relevant notes have been reviewed.     Specialty medication(s) and dose(s) confirmed: Regimen is correct and unchanged.   Changes to medications: Breella reports no changes at this time.  Changes to insurance: No  New side effects reported not previously addressed with a pharmacist or physician: None reported  Questions for the pharmacist: No    Confirmed patient received a Conservation officer, historic buildings and a Surveyor, mining with first shipment. The patient will receive a drug information handout for each medication shipped and additional FDA Medication Guides as required.       DISEASE/MEDICATION-SPECIFIC INFORMATION        For patients on injectable medications: Patient currently has 1 doses left.  Next injection is scheduled for 06/25/21.    SPECIALTY MEDICATION ADHERENCE     Medication Adherence    Specialty Medication: HUMIRA(CF) PEN 40 mg/0.4 mL  Patient is on additional specialty medications: No  Patient is on more than two specialty medications: No  Any gaps in refill history greater than 2 weeks in the last 3 months: no  Demonstrates understanding of importance of adherence: yes  Informant: patient  Reliability of informant: reliable  Confirmed plan for next specialty medication refill: delivery by pharmacy  Refills needed for supportive medications: not needed              Were doses missed due to medication being on hold? No    HUMIRA(CF) PEN 40 mg/0.4 mL : 7 days of medicine on hand       REFERRAL TO PHARMACIST     Referral to the pharmacist: Not needed      [facility]     Shipping address confirmed in Epic.     Delivery Scheduled: Yes, Expected medication delivery date: 06/30/21.     Medication will be delivered via UPS to the prescription address in Epic WAM.    Yolonda Kida   [facility] Specialty Technician

## 2021-06-26 NOTE — Unmapped (Unsigned)
[facility]   FOLLOWUP NOTE - INFLAMMATORY BOWEL DISEASE  06/28/2021    Demographics:  Jasmine Mitchell is a 51 y.o. year old female    Diagnosis:  Crohn's Disease  Disease onset (yr): 4  Age at onset:   < 20 yr old (A1)  Location:  Ileocolonic (L3) (mostly colonic)  Behavior:  Perianal (P), Stricturing (B2)  Current Tight Control Scenario:   Maintenance = Biologic          HPI / NOTE :     Interval Events:   1.  Last seen by me 07/2020 - at that time we continued Humira for her Crohn's and increased Desipramine to 100mg  daily and added bentyl 1 tab TID for IBS.   2.  No recent labs (last 03/2020)    HPI:  ***    (symptoms, needs labs)    Abdominal pain (0-10): mild to moderate cramping as above  BM a day: ~4x/day  Consistency: soft  % of stools have blood: 0%  Nocturnal BM: rare  Urgency:  mild  Weight change over last 6 mo: stable  Smoking:  no  NSAIDS: avoids    Review of Systems:   Review of systems positive for: negative except as above.   Otherwise, the balance of 10 systems is negative.          IBD HISTORY:     Brief IBD Disease Course:    - 65 - dx with Crohn's dz ~age 59, sx of diarrhea, abd pain, rectal pain and bloody stools.  Treated with sulfasalazine, Asacol, Pentasa without benefit.  Got courses of steroids and antibiotics on and off.  Tried on but had nausea.   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    - 2004 - Remicade x 9 months. Stopped in 2005 due to 2ndary loss of response.   - 2005 - developed a pyoderma lesion.   - 2010 - Re-established care with Dr. Marcella Dubs.  Concern for internal fistulizing disease.  Was on Cimzia. Had surgery for perianal disease.   - 2011 - Started on Humira.   - 2012 - active perianal disease with a large abscess.  Required EUA, drainage, setons (Sadiq).   - 2013 - April, dx with pelvic floor dyssynergia and anal sphincter weakness, underwent biofeedback x 2 courses ([facility] and [facility]).   - 2014 - on - doing well on Humira and long term Cipro.  Having on and off bowel symptoms symptoms which have been thought to be related to intermittent IBS symptoms.     Endoscopy:      - Colonoscopy 06/17/2003: patch inflammation and ulcers throughout the colon.    - Colonoscopy 01/2009:  Active crohn's at IC valve. Pseudopolyps.  Retraction of IC valve. ?Rectosigmoid fistula tract.  Otherwise normal colon.   - Colonoscopy 10/08/2009:  Perianal fistula, pseudopolyps.  Few aphthae in TI.   - Colonoscopy 07/29/2010:  Poor colon prep, pseudopolyps, stricture at IC valve.  Few ulcers at IC valve. Rectal stricture. Ileal crohn's disease.   - Colonoscopy 05/05/2011:  Pseduopolyps throughout.  4mm cecal polyp. Large fistula in sigmoid colon. Normal TI.   - Colonoscopy 09/06/2012:  Healed scars from prior fistulotomies on perianal exam. Scar in ascending colon. No active Crohn's.   - Colonoscopy 10/24/13:  Pseudopolyps in rectosigmoid, otherwise normal colon and TI.   - Colonoscopy 10/01/14:  Pseudopolyps in rectum, transverse and ascending colon.  Otherwise normal colon.  Granular TI.  Fixed and open IC valve (  not stenotic).   - Colonoscopy 08/12/16:  Mild anal canal stenosis on exam, otherwise normal colon and ileum.     PATH = chronic quiescent colitis (left and right colon) no dysplasia  - Colonoscopy 01/28/2019 - mild anal stricture (dilated to 20mm via Hegar), otherwise normal colon and ileum with no active inflammation    Imaging:    - CT A/P 10/24/2013: mild colonic wall thickening at splenic/descending colon, no other bowel inflammation or stranding.  *Pachy groundglass opacity in left lung base.     Prior IBD medications (type, dose, duration, response):  x 5-ASAs - Sulfasalazine, Asacol, Pentasa  x Oral corticosteroids - Prednisone  ? Intravenous corticosteroids  x Antibiotics - Flagyl - nausea.  Cipro - tolerates well and it helps.   x Thiopurines - - nausea.    TPMT - normal 07/25/17  x Methotrexate - 2005.   x Anti-TNF therapies - Remicade 2004 - 2005 (~6 doses). Had gradual loss of response so stopped. Tried on Remicade again in 2009 with some effect, increased to 10mg /kg q4 weeks. Cimzia - started 09/2008.  Humira 2014 - dose increased to 80mg  q2 weeks.  04/2016 - humira level 5.6, Ab negative.   ? Cyclosporine  ? Clinical trial medication  ? Other (Please specify):    Extraintestinal manifestations:   -joint pains affecting: n  -eye: n  -skin: n  -oral ulcers :  n  -blood clots: n  -PSC: n  -other: n          Past Medical History:   Past medical history:   Past Medical History:   Diagnosis Date   ??? Acid reflux    ??? Arthritis    ??? Crohn disease (CMS-HCC)    ??? Fibromyalgia    ??? UTI (urinary tract infection)      Past surgical history:   Past Surgical History:   Procedure Laterality Date   ??? FRACTURE SURGERY      left wrist   ??? GASTROCUTANEOUS FISTULA CLOSURE     ??? ORTHOPEDIC SURGERY     ??? PR COLONOSCOPY FLX DX W/COLLJ SPEC WHEN PFRMD N/A 10/24/2013    Procedure: COLONOSCOPY, FLEXIBLE, PROXIMAL TO SPLENIC FLEXURE; DIAGNOSTIC, W/WO COLLECTION SPECIMEN BY BRUSH OR WASH;  Surgeon: Theadore Nan, MD;  [facility];  Service: Gastroenterology   ??? PR COLONOSCOPY W/BIOPSY SINGLE/MULTIPLE N/A 10/01/2014    Procedure: COLONOSCOPY, FLEXIBLE, PROXIMAL TO SPLENIC FLEXURE; WITH BIOPSY, SINGLE OR MULTIPLE;  Surgeon: Teodoro Spray, MD;  Location: [facility];  Service: Gastroenterology   ??? PR COLONOSCOPY W/BIOPSY SINGLE/MULTIPLE N/A 08/12/2016    Procedure: COLONOSCOPY, FLEXIBLE, PROXIMAL TO SPLENIC FLEXURE; WITH BIOPSY, SINGLE OR MULTIPLE;  Surgeon: Zetta Bills, MD;  Location: [facility];  Service: Gastroenterology   ??? PR COLONOSCOPY W/BIOPSY SINGLE/MULTIPLE Left 01/28/2019    Procedure: COLONOSCOPY, FLEXIBLE, PROXIMAL TO SPLENIC FLEXURE; WITH BIOPSY, SINGLE OR MULTIPLE;  Surgeon: Zetta Bills, MD;  Location: [facility] [facility];  Service: Gastroenterology   ??? PR COLSC FLX W/RMVL OF TUMOR POLYP LESION SNARE TQ Left 01/28/2019    Procedure: COLONOSCOPY FLEX; W/REMOV TUMOR/LES BY SNARE;  Surgeon: Zetta Bills, MD;  Location: [facility] PROCEDURES [facility] [facility];  Service: Gastroenterology   ??? PR SURG DIAGNOSTIC EXAM, ANORECTAL N/A 12/23/2016    Procedure: ANORECTAL EXAM, SURGICAL, REQUIRING ANESTHESIA (GENERAL, SPINAL, OR EPIDURAL), DIAGNOSTIC;  Surgeon: Mickle Asper, MD;  Location: MAIN OR [place];  Service: Gastrointestinal   ??? PR UPPER GI ENDOSCOPY,DIAGNOSIS N/A  08/12/2016    Procedure: UGI ENDO, INCLUDE ESOPHAGUS, STOMACH, & DUODENUM &/OR JEJUNUM; DX W/WO COLLECTION SPECIMN, BY BRUSH OR WASH;  Surgeon: Zetta Bills, MD;  Location: [facility] PROC[facility];  Service: Gastroenterology   ??? TUBAL LIGATION       Family history:   Family History   Problem Relation Age of Onset   ??? Cancer Sister    ??? Cancer Paternal Grandmother    ??? Colorectal Cancer Neg Hx      Social history:   Social History     Socioeconomic History   ??? Marital status: Single   Tobacco Use   ??? Smoking status: Never   ??? Smokeless tobacco: Never   Substance and Sexual Activity   ??? Alcohol use: Yes     Alcohol/week: 0.0 standard drinks     Comment: socially    ??? Drug use: No             Allergies:     Allergies   Allergen Reactions   ??? Oxycodone (Bulk) Nausea And Vomiting     Patient tolerates acetaminophen well             Medications:     Current Outpatient Medications   Medication Sig Dispense Refill   ??? acetaminophen (TYLENOL) 500 MG tablet Take 1,000 mg by mouth every four (4) hours as needed for pain.     ??? buPROPion (WELLBUTRIN XL) 150 MG 24 hr tablet Take 150 mg by mouth daily.     ??? desipramine (NORPRAMIN) 25 MG tablet TAKE 4 TABLETS (100 MG TOTAL) BY MOUTH NIGHTLY. 360 tablet 0   ??? fluticasone (FLONASE) 50 mcg/actuation nasal spray 1 SPRAY BY EACH NARE ROUTE DAILY. AS DIRECTED (Patient taking differently: 1 spray by Each Nare route daily as needed. As directed) 16 mL 2   ??? HUMIRA PEN CITRATE FREE 40 MG/0.4 ML Inject the contents of 1 pen (40 mg total) under the skin once a week. 4 each 2   ??? loratadine (CLARITIN) 10 mg tablet Take 10 mg by mouth once as needed.      ??? MULTIVIT WITH CALCIUM,IRON,MIN (WOMEN'S DAILY MULTIVITAMIN ORAL) Take 1 tablet by mouth once daily.     ??? ondansetron (ZOFRAN-ODT) 8 MG disintegrating tablet Take 8 mg by mouth every eight (8) hours as needed.      ??? predniSONE (DELTASONE) 10 mg tablet pack Take by mouth daily. 6 day dose pack for hurt arm.     ??? sulfamethoxazole-trimethoprim (BACTRIM DS) 800-160 mg per tablet Take 1 tablet by mouth. (Patient not taking: Reported on 09/25/2020)       No current facility-administered medications for this visit.             Physical Exam:   There were no vitals taken for this visit.  ***  GEN: no apparent distress, appears comfortable on exam  HEENT: OP clear with no erythema, lesions, exudate, mucous membranes moist  NECK: Supple, no lymphadenopathy  LUNGS: CTAB, no wheezes, rales, or rhonchi  CV: S1/S2, RRR, no murmurs  ABD: Soft, nontender, no rebound/guarding, nondistended, normoactive bowel sounds, no appreciable organomegaly  Extremities: no cyanosis, clubbing or edema, normal gait  Psych: affect appropriate, A&O x3  SKIN: no visible lesions on face, neck, arms, abdomen          Labs, Data & Indices:     Lab Review:   Lab Results   Component Value Date    WBC 5.6 12/17/2019    WBC  6.5 04/29/2016    RBC 4.42 12/17/2019    RBC 4.50 04/29/2016    HGB 13.0 12/17/2019    HGB 13.2 04/29/2016     Lab Results   Component Value Date    AST 18 12/17/2019    AST 13 04/29/2016    ALT 19 12/17/2019    ALT 9 04/29/2016    BUN 11 12/17/2019    BUN 11 04/29/2016    Creatinine 0.66 12/17/2019    Creatinine 0.80 04/29/2016    CO2 27.8 12/17/2019    CO2 21 04/29/2016    Albumin 3.6 12/17/2019    Albumin 4.3 08/19/2014    Calcium 9.7 12/17/2019    Calcium 9.4 04/29/2016     Lab Results   Component Value Date    TSH 2.810 04/29/2016 ...........................................................................................................................................Marland Kitchen   Diagnosis ICD-10-CM Associated Orders   1. Crohn's disease of both small and large intestine without complication (CMS-HCC)  K50.80               Assessment & Recommendations:   Disease state:  Ileocolonic Crohn's with perianal disease and anorectal stricture    Jasmine Mitchell is a 51 y.o. female with a long standing hx of stricturing ileocolonic Crohn's disease.  She also has hx of perianal disease (last requiring drainage by Dr. Elenore Rota 2018).  She is currently maintained on weekly Humira monotherapy in clinical and endoscopic remission on this therapy. She has Irritable bowel with diarrhea and pain, which has responded well to desipramine in the past.  She does have ongoing issues with stress and anxiety, which has been partially relieved by Wellbutrin. We will increase to desipramine 100mg  daily and also add Bentyl which helped her cramping and pain in the past.   ***  PLAN: ***  1.  Continue Humira weekly injections  2.  Increase Desipramine to 100mg  daily at bedtime, refill sent  3.  Start Bentyl 1 tablet three times daily.  We can adjust the dosage if needed.   4.  We will discuss updating lab work and a surveillance colonoscopy at next visit.   5.  Follow up with me in 4-6 months.    IBD health maintenance:  Influenza vaccine: 2012, 117/17  Pneumonia vaccine: Prevnar 02/2014; Pneumovax 3//08/2018  COVID Vaccine:  Pfizer April 2021 x 2 doses, 3rd shot 04/2020  Hepatitis B:   TB testing:   Chickenpox/Shingles history:   Bone denistometry:   Derm appointment:  Last small bowel imaging:   Last colonoscopy: 01/2019  PAP smear:   --------------------------------------------  Author: Zetta Bills 06/28/2021 12:42 PM    Zetta Bills, MD  Assistant Professor of Medicine  Division of Gastroenterology & Hepatology  [facility]  =========================================

## 2021-06-29 MED FILL — HUMIRA PEN CITRATE FREE 40 MG/0.4 ML: SUBCUTANEOUS | 28 days supply | Qty: 4 | Fill #2

## 2021-07-14 ENCOUNTER — Other Ambulatory Visit: Payer: Self-pay | Admitting: Obstetrics

## 2021-07-14 DIAGNOSIS — N3001 Acute cystitis with hematuria: Secondary | ICD-10-CM

## 2021-07-26 DIAGNOSIS — K580 Irritable bowel syndrome with diarrhea: Principal | ICD-10-CM

## 2021-07-26 MED ORDER — DESIPRAMINE 25 MG TABLET
ORAL_TABLET | Freq: Every evening | ORAL | 0 refills | 90.00000 days | Status: CP
Start: 2021-07-26 — End: ?

## 2021-07-26 NOTE — Unmapped (Signed)
The [facility] has made a second and final attempt to reach this patient to refill the following medication: HUMIRA(CF) PEN 40 mg/0.4 mL.      We have left voicemails on the following phone numbers: [phone] and have sent a MyChart message.    Dates contacted: 07/21/21 & 07/26/21  Last scheduled delivery: 06/29/21 (28DS)    The patient may be at risk of non-compliance with this medication. The patient should call the [facility] at [phone]  Option 4, then Option 2 (all other specialty patients) to refill medication.    Yolonda Kida   [facility] Specialty Technician

## 2021-07-30 IMAGING — DX DG WRIST COMPLETE 3+V*L*
3 series · 3 of 3 positions shown · non-contrast
Comparison: None.

CLINICAL DATA: Left wrist pain.

EXAM:
LEFT WRIST - COMPLETE 3+ VIEW

[wrist pa]
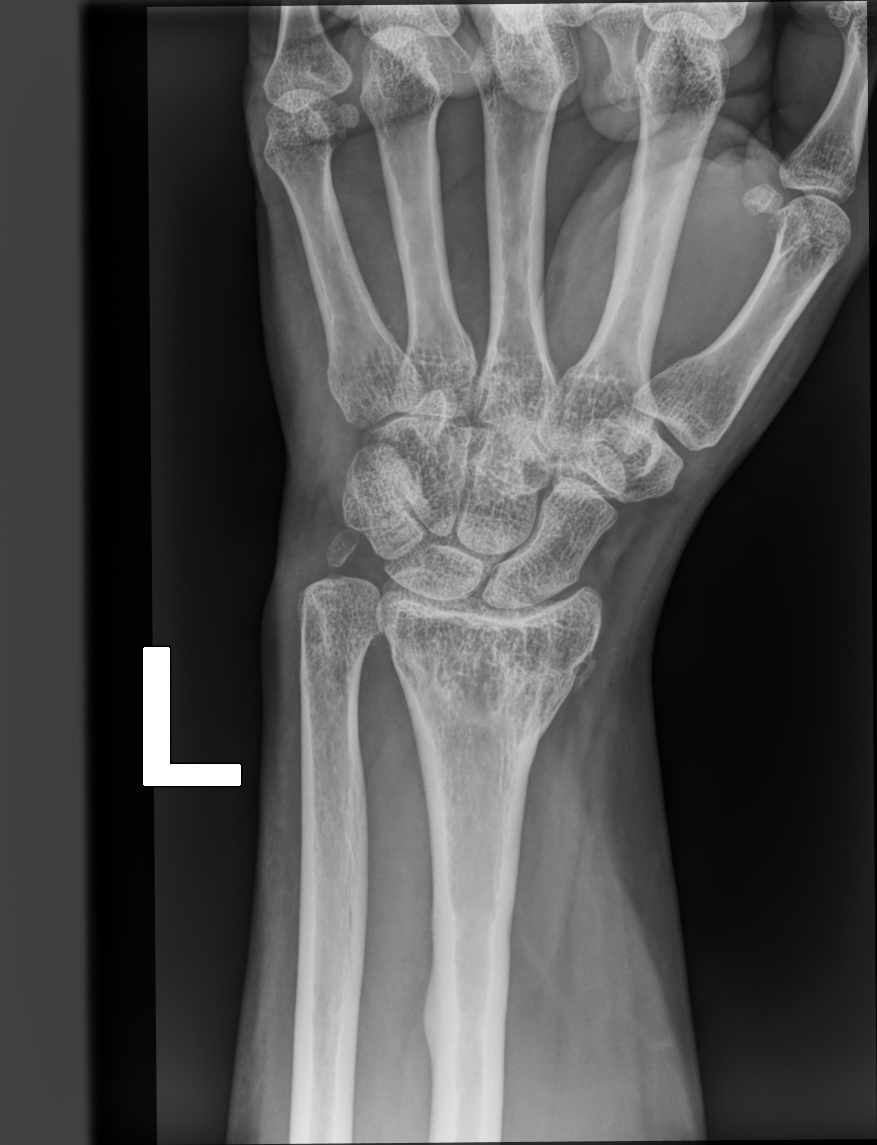

[wrist mlo]
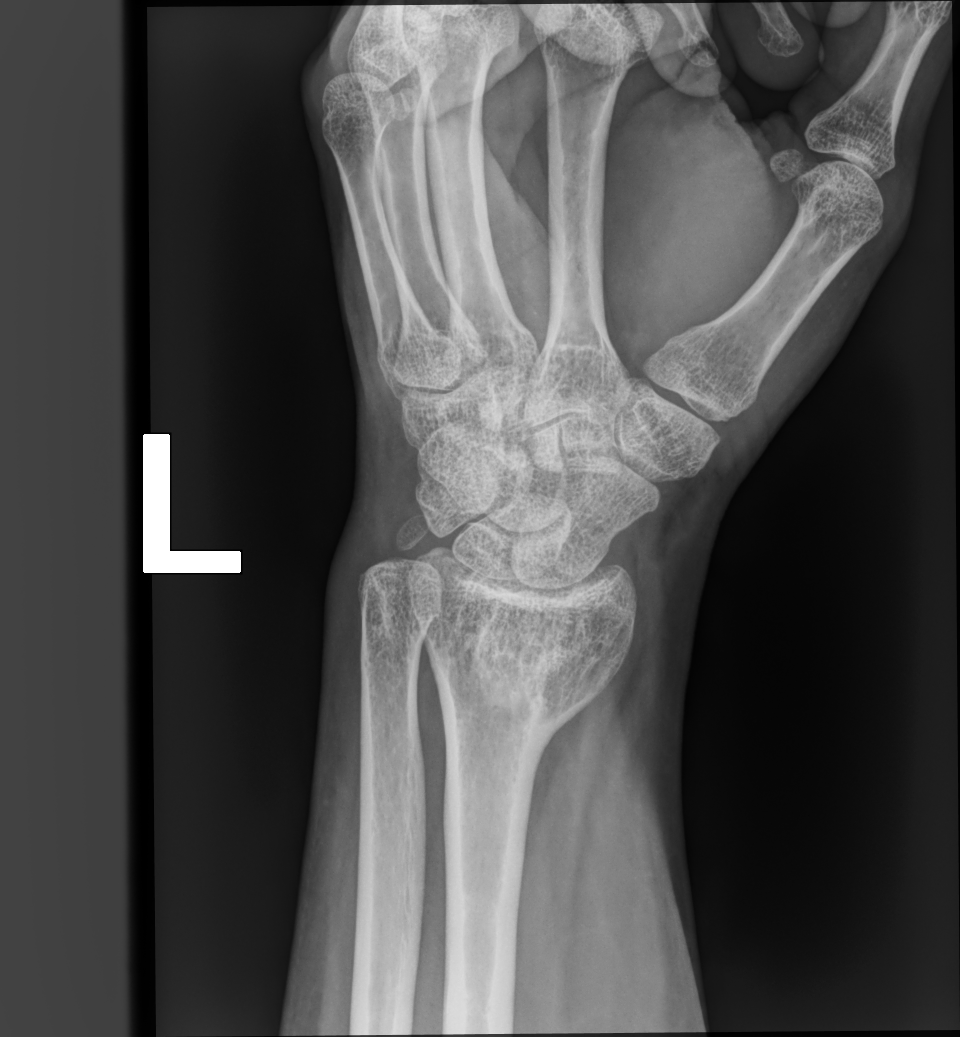

[wrist lat]
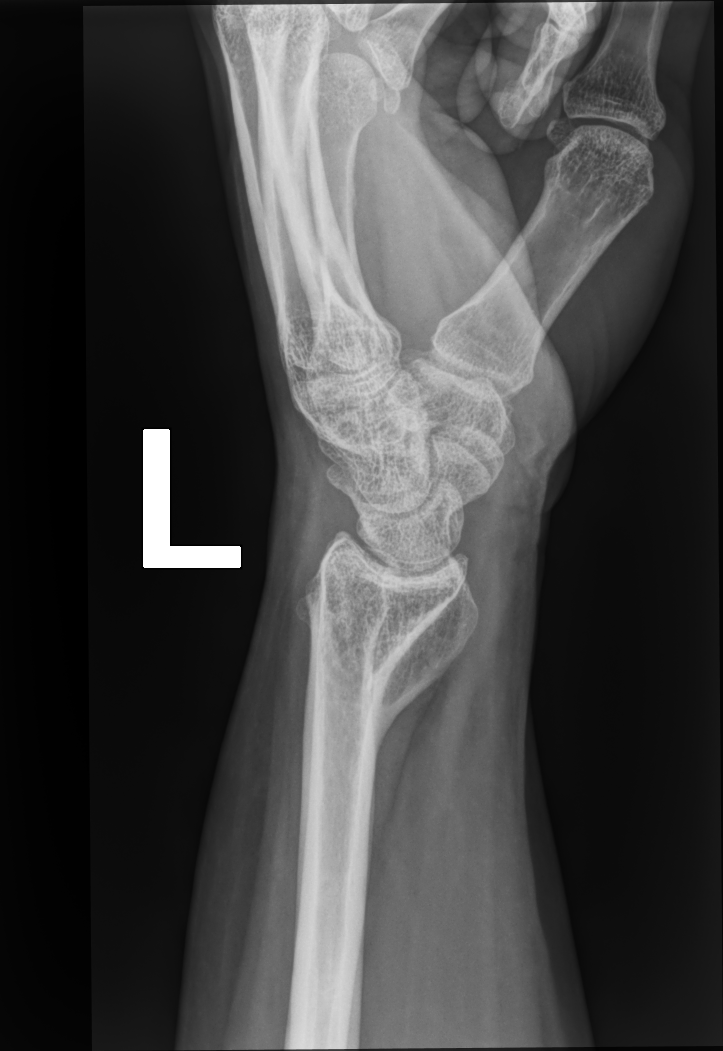

[3 of 3 positions shown; findings below may reference images not displayed]

FINDINGS: There is no evidence of fracture or dislocation. There is no
evidence of arthropathy or other focal bone abnormality. Soft
tissues are unremarkable.
IMPRESSION: Negative.

## 2021-08-09 DIAGNOSIS — K5080 Crohn's disease of both small and large intestine without complications: Principal | ICD-10-CM

## 2021-08-09 MED ORDER — HUMIRA PEN CITRATE FREE 40 MG/0.4 ML
SUBCUTANEOUS | 2 refills | 28 days
Start: 2021-08-09 — End: 2022-08-09

## 2021-08-09 NOTE — Unmapped (Signed)
[facility] Refill Coordination Note    Specialty Medication(s) to be Shipped:   Inflammatory Disorders: Humira    Other medication(s) to be shipped: No additional medications requested for fill at this time     Jasmine Mitchell, DOB: Oct 04, 1970  Phone: [phone] (home)       All above HIPAA information was verified with patient.     Was a Nurse, learning disability used for this call? No    Completed refill call assessment today to schedule patient's medication shipment from the [facility] [phone]).  All relevant notes have been reviewed.     Specialty medication(s) and dose(s) confirmed: Regimen is correct and unchanged.   Changes to medications: Jasmine Mitchell reports no changes at this time.  Changes to insurance: No  New side effects reported not previously addressed with a pharmacist or physician: None reported  Questions for the pharmacist: No    Confirmed patient received a Conservation officer, historic buildings and a Surveyor, mining with first shipment. The patient will receive a drug information handout for each medication shipped and additional FDA Medication Guides as required.       DISEASE/MEDICATION-SPECIFIC INFORMATION        For patients on injectable medications: Patient currently has 0 doses left.  Next injection is scheduled for 02/24.    SPECIALTY MEDICATION ADHERENCE     Medication Adherence    Patient reported X missed doses in the last month: 0  Specialty Medication: HUMIRA(CF) PEN 40 mg/0.4 mL  Patient is on additional specialty medications: No  Patient is on more than two specialty medications: No  Any gaps in refill history greater than 2 weeks in the last 3 months: no  Demonstrates understanding of importance of adherence: yes  Informant: patient  Reliability of informant: reliable  Provider-estimated medication adherence level: good  Patient is at risk for Non-Adherence: No  Reasons for non-adherence: no problems identified  Confirmed plan for next specialty medication refill: delivery by pharmacy  Refills needed for supportive medications: yes, ordered or provider notified          Refill Coordination    Has the Patients' Contact Information Changed: No  Is the Shipping Address Different: No         Were doses missed due to medication being on hold? No    humira 40/0.4 mg/ml: 0 days of medicine on hand         REFERRAL TO PHARMACIST     Referral to the pharmacist: Not needed      [facility]     Shipping address confirmed in Epic.     Delivery Scheduled: Yes, Expected medication delivery date: 02/23.  However, Rx request for refills was sent to the provider as there are none remaining.     Medication will be delivered via UPS to the prescription address in Epic WAM.    Jasmine Mitchell   [facility] Specialty Technician

## 2021-08-10 MED ORDER — HUMIRA PEN CITRATE FREE 40 MG/0.4 ML
SUBCUTANEOUS | 1 refills | 28.00000 days | Status: CP
Start: 2021-08-10 — End: 2022-08-10
  Filled 2021-08-11: qty 4, 28d supply, fill #0

## 2021-08-10 NOTE — Unmapped (Signed)
Patient is overdue for an app with Dr Raphael Gibney but does have one scheduled on 11/02/21. 2 month refill of humira sent to [facility] to get pt to appointment with Dr Raphael Gibney.

## 2021-08-23 NOTE — Unmapped (Unsigned)
[facility]   FOLLOWUP NOTE - INFLAMMATORY BOWEL DISEASE  08/22/2021    Demographics:  Jasmine Mitchell is a 51 y.o. year old female    Diagnosis:  Crohn's Disease  Disease onset (yr): 36  Age at onset:   < 89 yr old (A1)  Location:  Ileocolonic (L3) (mostly colonic)  Behavior:  Perianal (P), Stricturing (B2)  Current Tight Control Scenario:   Maintenance = Biologic          HPI / NOTE :     Interval Events:   1.  Last seen by me Feb 2022 - at that time we continued Humira weekly, and increased desipramine to 100mg  for dysepsia and IBS.   2.  Last labs 04/14/2020 - CBC, CMP, B12 levels all looked. CRP looked ok.  Sed rate was mildly elevated.     HPI:  ***    (needs annual labs!  Surveillance scope anytme this year).     Abdominal pain (0-10): mild to moderate cramping as above  BM a day: ~4x/day  Consistency: soft  % of stools have blood: 0%  Nocturnal BM: rare  Urgency:  mild  Weight change over last 6 mo: stable  Smoking:  no  NSAIDS: avoids    Review of Systems:   Review of systems positive for: negative except as above.   Otherwise, the balance of 10 systems is negative.          IBD HISTORY:     Brief IBD Disease Course:    - 80 - dx with Crohn's dz ~age 49, sx of diarrhea, abd pain, rectal pain and bloody stools.  Treated with sulfasalazine, Asacol, Pentasa without benefit.  Got courses of steroids and antibiotics on and off.  Tried on but had nausea.   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    - 2004 - Remicade x 9 months. Stopped in 2005 due to 2ndary loss of response.   - 2005 - developed a pyoderma lesion.   - 2010 - Re-established care with Dr. Marcella Dubs.  Concern for internal fistulizing disease.  Was on Cimzia. Had surgery for perianal disease.   - 2011 - Started on Humira.   - 2012 - active perianal disease with a large abscess.  Required EUA, drainage, setons ([place]).   - 2013 - April, dx with pelvic floor dyssynergia and anal sphincter weakness, underwent biofeedback x 2 courses ([place] and [place]).   - 2014 - on - doing well on Humira and long term Cipro.  Having on and off bowel symptoms symptoms which have been thought to be related to intermittent IBS symptoms.     Endoscopy:      - Colonoscopy 06/17/2003: patch inflammation and ulcers throughout the colon.    - Colonoscopy 01/2009:  Active crohn's at IC valve. Pseudopolyps.  Retraction of IC valve. ?Rectosigmoid fistula tract.  Otherwise normal colon.   - Colonoscopy 10/08/2009:  Perianal fistula, pseudopolyps.  Few aphthae in TI.   - Colonoscopy 07/29/2010:  Poor colon prep, pseudopolyps, stricture at IC valve.  Few ulcers at IC valve. Rectal stricture. Ileal crohn's disease.   - Colonoscopy 05/05/2011:  Pseduopolyps throughout.  4mm cecal polyp. Large fistula in sigmoid colon. Normal TI.   - Colonoscopy 09/06/2012:  Healed scars from prior fistulotomies on perianal exam. Scar in ascending colon. No active Crohn's.   - Colonoscopy 10/24/13:  Pseudopolyps in rectosigmoid, otherwise normal colon and TI.   - Colonoscopy 10/01/14:  Pseudopolyps in rectum, transverse  and ascending colon.  Otherwise normal colon.  Granular TI.  Fixed and open IC valve (not stenotic).   - Colonoscopy 08/12/16:  Mild anal canal stenosis on exam, otherwise normal colon and ileum.     PATH = chronic quiescent colitis (left and right colon) no dysplasia  - Colonoscopy 01/28/2019 - mild anal stricture (dilated to 20mm via [place]), otherwise normal colon and ileum with no active inflammation    Imaging:    - CT A/P 10/24/2013: mild colonic wall thickening at splenic/descending colon, no other bowel inflammation or stranding.  *Patchy groundglass opacity in left lung base.     Prior IBD medications (type, dose, duration, response):  x 5-ASAs - Sulfasalazine, Asacol, Pentasa  x Oral corticosteroids - Prednisone  ? Intravenous corticosteroids  x Antibiotics - Flagyl - nausea.  Cipro - tolerates well and it helps.   x Thiopurines - - nausea.    TPMT - normal 07/25/17  x Methotrexate - 2005.   x Anti-TNF therapies - Remicade 2004 - 2005 (~6 doses).  Had gradual loss of response so stopped. Tried on Remicade again in 2009 with some effect, increased to 10mg /kg q4 weeks. Cimzia - started 09/2008.  Humira 2014 - dose increased to 80mg  q2 weeks.  04/2016 - humira level 5.6, Ab negative.   ? Cyclosporine  ? Clinical trial medication  ? Other (Please specify):    Extraintestinal manifestations:   -joint pains affecting: n  -eye: n  -skin: n  -oral ulcers :  n  -blood clots: n  -PSC: n  -other: n          Past Medical History:   Past medical history:   Past Medical History:   Diagnosis Date   ??? Acid reflux    ??? Arthritis    ??? Crohn disease (CMS-HCC)    ??? Fibromyalgia    ??? UTI (urinary tract infection)      Past surgical history:   Past Surgical History:   Procedure Laterality Date   ??? FRACTURE SURGERY      left wrist   ??? GASTROCUTANEOUS FISTULA CLOSURE     ??? ORTHOPEDIC SURGERY     ??? PR COLONOSCOPY FLX DX W/COLLJ SPEC WHEN PFRMD N/A 10/24/2013    Procedure: COLONOSCOPY, FLEXIBLE, PROXIMAL TO SPLENIC FLEXURE; DIAGNOSTIC, W/WO COLLECTION SPECIMEN BY BRUSH OR WASH;  Surgeon: Theadore Nan, MD;  [facility];  Service: Gastroenterology   ??? PR COLONOSCOPY W/BIOPSY SINGLE/MULTIPLE N/A 10/01/2014    Procedure: COLONOSCOPY, FLEXIBLE, PROXIMAL TO SPLENIC FLEXURE; WITH BIOPSY, SINGLE OR MULTIPLE;  Surgeon: Teodoro Spray, MD;  Location: [facility];  Service: Gastroenterology   ??? PR COLONOSCOPY W/BIOPSY SINGLE/MULTIPLE N/A 08/12/2016    Procedure: COLONOSCOPY, FLEXIBLE, PROXIMAL TO SPLENIC FLEXURE; WITH BIOPSY, SINGLE OR MULTIPLE;  Surgeon: Zetta Bills, MD;  Location: [facility] PROCED[facility];  Service: Gastroenterology   ??? PR COLONOSCOPY W/BIOPSY SINGLE/MULTIPLE Left 01/28/2019    Procedure: COLONOSCOPY, FLEXIBLE, PROXIMAL TO SPLENIC FLEXURE; WITH BIOPSY, SINGLE OR MULTIPLE;  Surgeon: Zetta Bills, MD;  Location: [facility];  Service: Gastroenterology   ??? PR COLSC FLX W/RMVL OF TUMOR POLYP LESION SNARE TQ Left 01/28/2019    Procedure: COLONOSCOPY FLEX; W/REMOV TUMOR/LES BY SNARE;  Surgeon: Zetta Bills, MD;  Location: [facility]ED[facility];  Service: Gastroenterology   ??? PR SURG DIAGNOSTIC EXAM, ANORECTAL N/A 12/23/2016    Procedure: ANORECTAL EXAM, SURGICAL, REQUIRING ANESTHESIA (GENERAL, SPINAL, OR EPIDURAL), DIAGNOSTIC;  Surgeon: Mickle Asper,  MD;  Location: MAIN OR [place];  Service: Gastrointestinal   ??? PR UPPER GI ENDOSCOPY,DIAGNOSIS N/A 08/12/2016    Procedure: UGI ENDO, INCLUDE ESOPHAGUS, STOMACH, & DUODENUM &/OR JEJUNUM; DX W/WO COLLECTION SPECIMN, BY BRUSH OR WASH;  Surgeon: Zetta Bills, MD;  Location: [facility];  Service: Gastroenterology   ??? TUBAL LIGATION       Family history:   Family History   Problem Relation Age of Onset   ??? Cancer Sister    ??? Cancer Paternal Grandmother    ??? Colorectal Cancer Neg Hx      Social history:   Social History     Socioeconomic History   ??? Marital status: Single   Tobacco Use   ??? Smoking status: Never   ??? Smokeless tobacco: Never   Substance and Sexual Activity   ??? Alcohol use: Yes     Alcohol/week: 0.0 standard drinks     Comment: socially    ??? Drug use: No             Allergies:     Allergies   Allergen Reactions   ??? Oxycodone (Bulk) Nausea And Vomiting     Patient tolerates acetaminophen well             Medications:     Current Outpatient Medications   Medication Sig Dispense Refill   ??? acetaminophen (TYLENOL) 500 MG tablet Take 1,000 mg by mouth every four (4) hours as needed for pain.     ??? buPROPion (WELLBUTRIN XL) 150 MG 24 hr tablet Take 150 mg by mouth daily.     ??? desipramine (NORPRAMIN) 25 MG tablet TAKE 4 TABLETS (100 MG TOTAL) BY MOUTH NIGHTLY. 360 tablet 0   ??? fluticasone (FLONASE) 50 mcg/actuation nasal spray 1 SPRAY BY EACH NARE ROUTE DAILY. AS DIRECTED (Patient taking differently: 1 spray by Each Nare route daily as needed. As directed) 16 mL 2   ??? HUMIRA PEN CITRATE FREE 40 MG/0.4 ML Inject the contents of 1 pen (40 mg total) under the skin once a week. 4 each 1   ??? loratadine (CLARITIN) 10 mg tablet Take 10 mg by mouth once as needed.      ??? MULTIVIT WITH CALCIUM,IRON,MIN (WOMEN'S DAILY MULTIVITAMIN ORAL) Take 1 tablet by mouth once daily.     ??? ondansetron (ZOFRAN-ODT) 8 MG disintegrating tablet Take 8 mg by mouth every eight (8) hours as needed.      ??? predniSONE (DELTASONE) 10 mg tablet pack Take by mouth daily. 6 day dose pack for hurt arm.     ??? sulfamethoxazole-trimethoprim (BACTRIM DS) 800-160 mg per tablet Take 1 tablet by mouth. (Patient not taking: Reported on 09/25/2020)       No current facility-administered medications for this visit.             Physical Exam:   There were no vitals taken for this visit.  ***  GEN: no apparent distress, appears comfortable on exam  HEENT: OP clear with no erythema, lesions, exudate, mucous membranes moist  NECK: Supple, no lymphadenopathy  LUNGS: CTAB, no wheezes, rales, or rhonchi  CV: S1/S2, RRR, no murmurs  ABD: Soft, nontender, no rebound/guarding, nondistended, normoactive bowel sounds, no appreciable organomegaly  Extremities: no cyanosis, clubbing or edema, normal gait  Psych: affect appropriate, A&O x3  SKIN: no visible lesions on face, neck, arms, abdomen          Labs, Data & Indices:     Lab Review:  Lab Results   Component Value Date    WBC 5.6 12/17/2019    WBC 6.5 04/29/2016    RBC 4.42 12/17/2019    RBC 4.50 04/29/2016    HGB 13.0 12/17/2019    HGB 13.2 04/29/2016     Lab Results   Component Value Date    AST 18 12/17/2019    AST 13 04/29/2016    ALT 19 12/17/2019    ALT 9 04/29/2016    BUN 11 12/17/2019    BUN 11 04/29/2016    Creatinine 0.66 12/17/2019    Creatinine 0.80 04/29/2016    CO2 27.8 12/17/2019    CO2 21 04/29/2016    Albumin 3.6 12/17/2019    Albumin 4.3 08/19/2014    Calcium 9.7 12/17/2019    Calcium 9.4 04/29/2016     Lab Results   Component Value Date    TSH 2.810 04/29/2016 ...........................................................................................................................................Marland Kitchen   Diagnosis ICD-10-CM Associated Orders   1. Crohn's disease of both small and large intestine without complication (CMS-HCC)  K50.80       2. Irritable bowel syndrome with diarrhea  K58.0               Assessment & Recommendations:   Disease state:  Ileocolonic Crohn's with perianal disease and anorectal stricture    Jasmine Mitchell is a 51 y.o. female with a long standing hx of stricturing ileocolonic Crohn's disease.  She also has hx of perianal disease (last requiring drainage by Dr. Elenore Rota 2018).  She is currently maintained on weekly Humira monotherapy in clinical and endoscopic remission on this therapy. She has Irritable bowel with diarrhea and pain, which has responded well to desipramine in the past.  She does have ongoing issues with stress and anxiety, which has been partially relieved by Wellbutrin. She is having partial improvement with Desipramine 75mg  at bedtime.  We will increase to 100mg  daily and also add Bentyl which helped her cramping and pain in the past. ***    PLAN: ***  1.  Continue Humira weekly injections  2.  Increase Desipramine to 100mg  daily at bedtime, refill sent  3.  Start Bentyl 1 tablet three times daily.  We can adjust the dosage if needed.   4.  We will discuss updating lab work and a surveillance colonoscopy at next visit.   5.  Follow up with me in 4-6 months.      IBD health maintenance:  Influenza vaccine: 2012, 117/17  Pneumonia vaccine: Prevnar 02/2014; Pneumovax 3//08/2018  COVID Vaccine:  Pfizer April 2021 x 2 doses, 3rd shot 04/2020  Hepatitis B:   TB testing:   Chickenpox/Shingles history:   Bone denistometry:   Derm appointment:  Last small bowel imaging:   Last colonoscopy: 01/2019  PAP smear:   --------------------------------------------  Author: Zetta Bills 08/22/2021 8:29 PM    Zetta Bills, MD  Assistant Professor of Medicine  Division of Gastroenterology & Hepatology  [facility]  =========================================

## 2021-09-13 NOTE — Unmapped (Signed)
[facility] Clinical Assessment & Refill Coordination Note    Jasmine Mitchell, : December 22, 1970  Phone: [phone] (home)     All above HIPAA information was verified with patient.     Was a Nurse, learning disability used for this call? No    Specialty Medication(s):   Inflammatory Disorders: Humira     Current Outpatient Medications   Medication Sig Dispense Refill   ??? acetaminophen (TYLENOL) 500 MG tablet Take 2 tablets (1,000 mg total) by mouth every four (4) hours as needed for pain.     ??? buPROPion (WELLBUTRIN XL) 150 MG 24 hr tablet Take 1 tablet (150 mg total) by mouth daily.     ??? desipramine (NORPRAMIN) 25 MG tablet TAKE 4 TABLETS (100 MG TOTAL) BY MOUTH NIGHTLY. 360 tablet 0   ??? MULTIVIT WITH CALCIUM,IRON,MIN (WOMEN'S DAILY MULTIVITAMIN ORAL) Take 1 tablet by mouth once daily.     ??? ondansetron (ZOFRAN-ODT) 8 MG disintegrating tablet Take 1 tablet (8 mg total) by mouth every eight (8) hours as needed.     ??? fluticasone (FLONASE) 50 mcg/actuation nasal spray 1 SPRAY BY EACH NARE ROUTE DAILY. AS DIRECTED (Patient taking differently: 1 spray by Each Nare route daily as needed. As directed) 16 mL 2   ??? HUMIRA PEN CITRATE FREE 40 MG/0.4 ML Inject the contents of 1 pen (40 mg total) under the skin once a week. 4 each 1   ??? predniSONE (DELTASONE) 10 mg tablet pack Take by mouth daily. 6 day dose pack for hurt arm. (Patient not taking: Reported on 09/13/2021)       No current facility-administered medications for this visit.        Changes to medications: Titianna reports no changes at this time.    Allergies   Allergen Reactions   ??? Oxycodone (Bulk) Nausea And Vomiting     Patient tolerates acetaminophen well       Changes to allergies: No    SPECIALTY MEDICATION ADHERENCE     Humira 40 mg/0.51mL: 0 days of medicine on hand     Medication Adherence    Patient reported X missed doses in the last month: 0  Specialty Medication: Humira 40 mg/0.73mL weekly  Patient is on additional specialty medications: No  Informant: patient          Specialty medication(s) dose(s) confirmed: Regimen is correct and unchanged.     Are there any concerns with adherence? No    Adherence counseling provided? Not needed    CLINICAL MANAGEMENT AND INTERVENTION      Clinical Benefit Assessment:    Do you feel the medicine is effective or helping your condition? Yes    Clinical Benefit counseling provided? Not needed    Adverse Effects Assessment:    Are you experiencing any side effects? No    Are you experiencing difficulty administering your medicine? No    Quality of Life Assessment:    Quality of Life    Rheumatology  Oncology  Dermatology  Cystic Fibrosis          How many days over the past month did your crohn's disease  keep you from your normal activities? For example, brushing your teeth or getting up in the morning. [name] states Humira, along with diet changes, is still working well to help control crohn's related symptoms.     Have you discussed this with your provider? Not needed    Acute Infection Status:    Acute infections noted  within Epic:  No active infections  Patient reported infection: None    Therapy Appropriateness:    Is therapy appropriate and patient progressing towards therapeutic goals? Yes, therapy is appropriate and should be continued    DISEASE/MEDICATION-SPECIFIC INFORMATION      For patients on injectable medications: Patient currently has 0 doses left.  Next injection is scheduled for 3/31.    PATIENT SPECIFIC NEEDS     - Does the patient have any physical, cognitive, or cultural barriers? No    - Is the patient high risk? No    - Does the patient require a Care Management Plan? No     SOCIAL DETERMINANTS OF HEALTH     At the [facility], we have learned that life circumstances - like trouble affording food, housing, utilities, or transportation can affect the health of many of our patients.   That is why we wanted to ask: are you currently experiencing any life circumstances that are negatively impacting your health and/or quality of life? No    Social Determinants of Psychologist, prison and probation services Strain: Not on file   Internet Connectivity: Not on file   Food Insecurity: Not on file   Tobacco Use: Not on file   Housing/Utilities: Not on file   Alcohol Use: Not on file   Transportation Needs: Not on file   Substance Use: Not on file   Health Literacy: Not on file   Physical Activity: Not on file   Interpersonal Safety: Not on file   Stress: Not on file   Intimate Partner Violence: Not on file   Depression: Not on file   Social Connections: Not on file       Would you be willing to receive help with any of the needs that you have identified today? Not applicable       SHIPPING     Specialty Medication(s) to be Shipped:   Inflammatory Disorders: Humira    Other medication(s) to be shipped: No additional medications requested for fill at this time     Changes to insurance: No    Delivery Scheduled: Yes, Expected medication delivery date: 09/15/21.     Medication will be delivered via UPS to the confirmed prescription address in [facility].    The patient will receive a drug information handout for each medication shipped and additional FDA Medication Guides as required.  Verified that patient has previously received a Conservation officer, historic buildings and a Surveyor, mining.    The patient or caregiver noted above participated in the development of this care plan and knows that they can request review of or adjustments to the care plan at any time.      All of the patient's questions and concerns have been addressed.    [name]   [facility] Specialty Pharmacist

## 2021-09-14 MED FILL — HUMIRA PEN CITRATE FREE 40 MG/0.4 ML: SUBCUTANEOUS | 28 days supply | Qty: 4 | Fill #1

## 2021-10-06 ENCOUNTER — Other Ambulatory Visit: Payer: Self-pay | Admitting: Obstetrics

## 2021-10-06 DIAGNOSIS — Z0000 Encounter for general adult medical examination without abnormal findings: Secondary | ICD-10-CM

## 2021-10-06 DIAGNOSIS — K5080 Crohn's disease of both small and large intestine without complications: Principal | ICD-10-CM

## 2021-10-06 MED ORDER — HUMIRA PEN CITRATE FREE 40 MG/0.4 ML
SUBCUTANEOUS | 1 refills | 28 days
Start: 2021-10-06 — End: 2022-10-06

## 2021-10-06 NOTE — Unmapped (Signed)
[facility] Refill Coordination Note    Specialty Medication(s) to be Shipped:   Inflammatory Disorders: Humira    Other medication(s) to be shipped: No additional medications requested for fill at this time     [name], DOB: 07-07-1970  Phone: [phone] (home)       All above HIPAA information was verified with patient.     Was a Nurse, learning disability used for this call? No    Completed refill call assessment today to schedule patient's medication shipment from the [facility] [phone]).  All relevant notes have been reviewed.     Specialty medication(s) and dose(s) confirmed: Regimen is correct and unchanged.   Changes to medications: Ajai reports no changes at this time.  Changes to insurance: No  New side effects reported not previously addressed with a pharmacist or physician: None reported  Questions for the pharmacist: No    Confirmed patient received a Conservation officer, historic buildings and a Surveyor, mining with first shipment. The patient will receive a drug information handout for each medication shipped and additional FDA Medication Guides as required.       DISEASE/MEDICATION-SPECIFIC INFORMATION        For patients on injectable medications: Patient currently has 1 doses left.  Next injection is scheduled for 10/08/21.    SPECIALTY MEDICATION ADHERENCE     Medication Adherence    Patient reported X missed doses in the last month: 0  Specialty Medication: HUMIRA(CF) PEN 40 mg/0.4 mL injection  Patient is on additional specialty medications: No              Were doses missed due to medication being on hold? No    Humira 40/0.4 mg/ml: 2 days of medicine on hand        REFERRAL TO PHARMACIST     Referral to the pharmacist: Not needed      [facility]     Shipping address confirmed in Epic.     Delivery Scheduled: Yes, Expected medication delivery date: 10/12/21.     Medication will be delivered via UPS to the prescription address in Epic [facility].    Willette Pa   [facility] Specialty Technician

## 2021-10-07 MED ORDER — HUMIRA PEN CITRATE FREE 40 MG/0.4 ML
SUBCUTANEOUS | 0 refills | 28 days | Status: CP
Start: 2021-10-07 — End: 2022-10-07
  Filled 2021-10-11: qty 4, 28d supply, fill #0

## 2021-10-07 NOTE — Unmapped (Signed)
Pt has not seen Dr. Raphael Gibney since Feb, 2022. Pt has previously cancelled appts with Dr. Raphael Gibney on 06/29/21, 08/24/21, 11/02/21. Has appt scheduled for 01/18/22 with Dr. Raphael Gibney. Refill request for Humira sent for one month supply. Will discuss with Dr. Raphael Gibney regarding future refills.

## 2021-10-08 ENCOUNTER — Ambulatory Visit: Payer: BC Managed Care – PPO | Admitting: Family Medicine

## 2021-10-08 ENCOUNTER — Telehealth: Payer: Self-pay | Admitting: Family Medicine

## 2021-10-08 ENCOUNTER — Encounter: Payer: Self-pay | Admitting: Family Medicine

## 2021-10-08 VITALS — BP 118/80 | HR 84 | Temp 97.6°F | Ht 65.0 in | Wt 219.2 lb

## 2021-10-08 DIAGNOSIS — J029 Acute pharyngitis, unspecified: Secondary | ICD-10-CM | POA: Diagnosis not present

## 2021-10-08 DIAGNOSIS — F988 Other specified behavioral and emotional disorders with onset usually occurring in childhood and adolescence: Secondary | ICD-10-CM | POA: Insufficient documentation

## 2021-10-08 LAB — POCT RAPID STREP A (OFFICE): Rapid Strep A Screen: POSITIVE — AB

## 2021-10-08 LAB — POC COVID19 BINAXNOW: SARS Coronavirus 2 Ag: NEGATIVE

## 2021-10-08 MED ORDER — FLUCONAZOLE 150 MG PO TABS: 150.0000 mg | ORAL_TABLET | Freq: Once | ORAL | 0 refills | Status: AC

## 2021-10-08 MED ORDER — AMPHETAMINE-DEXTROAMPHET ER 10 MG PO CP24: 10.0000 mg | ORAL_CAPSULE | Freq: Every day | ORAL | 0 refills | Status: DC

## 2021-10-08 MED ORDER — AMOXICILLIN 500 MG PO TABS: 1000.0000 mg | ORAL_TABLET | Freq: Every day | ORAL | 0 refills | Status: DC

## 2021-10-08 NOTE — Telephone Encounter (Signed)
Patient calling in with respiratory symptoms: ?Shortness of breath, chest pain, palpitations or other red words send to Triage ? ?Does the patient have a fever over 100, cough, congestion, sore throat, runny nose, lost of taste/smell (please list symptoms that patient has)?sore throat ? ?What date did symptoms start?10-03-2021 ?(If over 5 days ago, pt may be scheduled for in person visit) ? ?Have you tested for Covid in the last 5 days? No  ? ?If yes, was it positive []  OR negative [] ? If positive in the last 5 days, please schedule virtual visit now. If negative, schedule for an in person OV with the next available provider if PCP has no openings. Please also let patient know they will be tested again (follow the script below) ? ?"you will have to arrive 33mns prior to your appt time to be Covid tested. Please park in back of office at the cone & call [phone] let the staff know you have arrived. A staff member will meet you at your car to do a rapid covid test. Once the test has resulted you will be notified by phone of your results to determine if appt will remain an in person visit or be converted to a virtual/phone visit. If you arrive less than 39ms before your appt time, your visit will be automatically converted to virtual & any recommended testing will happen AFTER the visit." ?Pt has an appt with dr koEthlyn Galleryoday 10-08-2021 1030 am ? ?THINGS TO REMEMBER ? ?If no availability for virtual visit in office,  please schedule another [place] office ? ?If no availability at another [facility], please instruct patient that they can schedule an evisit or virtual visit through their mychart account. Visits up to 8pm ? ?patients can be seen in office 5 days after positive COVID test ? ?  ?

## 2021-10-08 NOTE — Progress Notes (Signed)
?Katrina Grant ?DOB: 03/15/1971 ?Encounter date: 10/08/2021 ? ?This is a 51 y.o. female who presents with ?Chief Complaint  ?Patient presents with  ? Sore Throat  ?  X5 days  ? ? ?History of present illness: ? ?Started with sore throat Sunday, just progressing. Glands now swollen. She was at beach last weekend. Was around nephew who was sick.  ? ?No fevers, no chills. Little phlegm in back of throat; but not significant cough or runny nose. ? ?On macrobid on and off for UTI. She is on now. Started on Monday. ? ?Feels that wellbutrin is not working like it used to. Having hard time at work- re-reading. Was on adderall about 10 years ago and took that for about a year. Doc retired and so rx just lapsed.  ? ?Anxiety is bad. Worries unnecessarily. If she doesn't take the wellbutrin then she can really tell a difference in mood. 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 ? ? ?Allergies  ?Allergen Reactions  ? Percocet [Oxycodone-Acetaminophen] Nausea And Vomiting  ? ?Current Meds  ?Medication Sig  ? acetaminophen (TYLENOL) 500 MG tablet Take 1,000 mg by mouth every 6 (six) hours as needed. For migraines and stomach upset  ? Adalimumab 40 MG/0.8ML PSKT Inject 40 mg into the skin once a week.  ? amphetamine-dextroamphetamine (ADDERALL XR) 10 MG 24 hr capsule Take 1 capsule (10 mg total) by mouth daily.  ? buPROPion (WELLBUTRIN XL) 300 MG 24 hr tablet TAKE 1 TABLET (300 MG TOTAL) BY MOUTH DAILY AFTER BREAKFAST.  ? dicyclomine (BENTYL) 10 MG capsule Take 10 mg by mouth 3 (three) times daily.  ? fluticasone (FLONASE) 50 MCG/ACT nasal spray PLACE 1 SPRAY INTO BOTH NOSTRILS DAILY. PATIENT NEEDS AN APPOINTMENT  ? nitrofurantoin, macrocrystal-monohydrate, (MACROBID) 100 MG capsule TAKE 1 CAPSULE (100 MG TOTAL) BY MOUTH 2 (TWO) TIMES DAILY.  ? ondansetron (ZOFRAN ODT) 8 MG disintegrating tablet Take 1 tablet (8 mg total) by mouth every 8 (eight) hours as needed for nausea or vomiting.  ?  Prenatal Vit-Fe Fumarate-FA (M-NATAL PLUS) 27-1 MG TABS TAKE 1 TABLET BY MOUTH EVERY DAY BEFORE BREAKFAST  ? traMADol (ULTRAM) 50 MG tablet Take 1 tablet (50 mg total) by mouth 3 (three) times daily as needed.  ? [DISCONTINUED] ACCU-CHEK FASTCLIX LANCETS MISC Use as directed to check blood sugar once day  ? [DISCONTINUED] glucose blood (ACCU-CHEK GUIDE) test strip Use as instructed  ? [DISCONTINUED] metroNIDAZOLE (FLAGYL) 500 MG tablet Take 1 tablet (500 mg total) by mouth 2 (two) times daily.  ? amoxicillin (AMOXIL) 500 MG tablet Take 2 tablets (1,000 mg total) by mouth daily.  ? fluconazole (DIFLUCAN) 150 MG tablet Take 1 tablet (150 mg total) by mouth once for 1 dose. Repeat in 72 hours if symptoms still present.  ? ? ?Review of Systems  ?Constitutional:  Negative for chills, fatigue and fever.  ?HENT:  Positive for sore throat. Negative for congestion, sinus pressure and sinus pain. Trouble swallowing: painful swallowing.  ?Respiratory:  Negative for cough, chest tightness, shortness of breath and wheezing.   ?Cardiovascular:  Negative for chest pain, palpitations and leg swelling.  ?Psychiatric/Behavioral:  Positive for decreased concentration. The patient is nervous/anxious.   ? ?Objective: ? ?BP 118/80 (BP Location: Left Arm, Patient Position: Sitting, Cuff Size: Large)   Pulse 84   Temp 97.6 ?F (36.4 ?C) (Axillary)   Ht 5' 5"  (1.651 m)   Wt 219 lb 3.2 oz (99.4 kg)   LMP 12/24/2020 (Approximate)  SpO2 98%   BMI 36.48 kg/m?   Weight: 219 lb 3.2 oz (99.4 kg)  ? ?BP Readings from Last 3 Encounters:  ?10/08/21 118/80  ?03/02/21 111/81  ?12/31/20 102/73  ? ?Wt Readings from Last 3 Encounters:  ?10/08/21 219 lb 3.2 oz (99.4 kg)  ?03/02/21 234 lb (106.1 kg)  ?12/31/20 230 lb 9.6 oz (104.6 kg)  ? ? ?Physical Exam ?Constitutional:   ?   General: She is not in acute distress. ?   Appearance: She is well-developed.  ?HENT:  ?   Right Ear: Tympanic membrane, ear canal and external ear normal.  ?   Left Ear:  Tympanic membrane, ear canal and external ear normal.  ?   Mouth/Throat:  ?   Mouth: Mucous membranes are moist.  ?   Pharynx: Pharyngeal swelling and posterior oropharyngeal erythema present.  ?   Tonsils: 2+ on the right. 2+ on the left.  ?Cardiovascular:  ?   Rate and Rhythm: Normal rate and regular rhythm.  ?   Heart sounds: Normal heart sounds. No murmur heard. ?  No friction rub.  ?Pulmonary:  ?   Effort: Pulmonary effort is normal. No respiratory distress.  ?   Breath sounds: Normal breath sounds. No wheezing or rales.  ?Musculoskeletal:  ?   Right lower leg: No edema.  ?   Left lower leg: No edema.  ?Neurological:  ?   Mental Status: She is alert and oriented to person, place, and time.  ?Psychiatric:     ?   Behavior: Behavior normal.  ? ? ?Assessment/Plan ?1. Sore throat ?Amoxicillin as directed.  Let me know if throat is not feeling better within 72 hours.  Diflucan also given as patient typically will get yeast infection was taking antibiotics. ?- POC COVID-19 ?- POC Rapid Strep A ? ?2. Attention deficit disorder, unspecified hyperactivity presence ?Wellbutrin has helped some, but is not helping as much as Adderall in the past.  Increasing difficulty with detail focus, concentration at work.  We will add back in Adderall.  Setting up follow-up in 1 month to review it with her.  I suspect that some of her anxiety and worry will improve as focus improves.  She will let me know if any worsening of symptoms. Discussed new medication(s) today with patient. Discussed potential side effects and patient verbalized understanding.  ?- amphetamine-dextroamphetamine (ADDERALL XR) 10 MG 24 hr capsule; Take 1 capsule (10 mg total) by mouth daily.  Dispense: 30 capsule; Refill: 0 ? ? ?Return in about 1 month (around 11/07/2021) for Chronic condition visit. ? ? ? ? ?Micheline Rough, MD ?

## 2021-10-08 NOTE — Unmapped (Signed)
Reason For Call:  Importance of coming to appointment in August with Dr. Raphael Gibney to continue to receive Humira refills.      Instructions Provided:  Left vm with pt to check mychart for detailed message.       Workup Time:   Time spent 5 mins

## 2021-10-13 ENCOUNTER — Other Ambulatory Visit: Payer: Self-pay | Admitting: Family Medicine

## 2021-10-13 ENCOUNTER — Telehealth: Payer: Self-pay | Admitting: Family Medicine

## 2021-10-13 MED ORDER — AMPHETAMINE-DEXTROAMPHET ER 20 MG PO CP24: 20.0000 mg | ORAL_CAPSULE | Freq: Every day | ORAL | 0 refills | Status: DC

## 2021-10-13 NOTE — Telephone Encounter (Signed)
Left a detailed message with the information below at the patient's cell number. ?

## 2021-10-13 NOTE — Telephone Encounter (Signed)
Ok; I have gone ahead and changed rx to 79m dose and sent this in for her. She can continue to double up on what she has. I did try to send 90 days prescription, but not sure if insurance will cover that way. ?

## 2021-10-13 NOTE — Telephone Encounter (Signed)
Pt wanted to let the provider know she is now taking 2 amphetamine-dextroamphetamine (ADDERALL XR) 10 MG 24 hr capsule a day because 1 per day was not working. States her prescription has enough to carry her at 1 per day and may need to be adjusted ?

## 2021-10-28 IMAGING — CT CT WRIST*L* W/O CM
4 of 6 series · 14 of 35 positions shown, 16 images · non-contrast
Comparison: X-ray 11/11/2020, MRI 10/22/2020

CLINICAL DATA: Extensor carpi ulnaris tendon tear. Remote history
of distal radial and ulnar fractures in 6558.

EXAM:
CT OF THE LEFT WRIST WITHOUT CONTRAST
TECHNIQUE: Multidetector CT imaging was performed according to the standard
protocol. Multiplanar CT image reconstructions were also generated.

[Series 3: wrist 1.50 br60 s3 axial bone hd fov · axial · 0.26mm/px · z∈[-632,-572]mm · 5 of 125 slices shown, 7 images]
[im 21/125  soft-tissue]
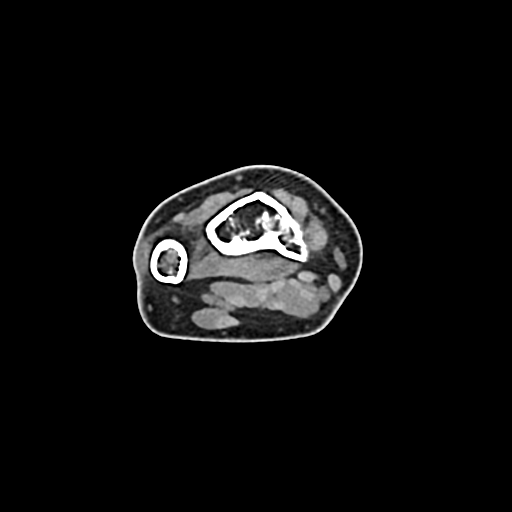
[im 21/125  bone]
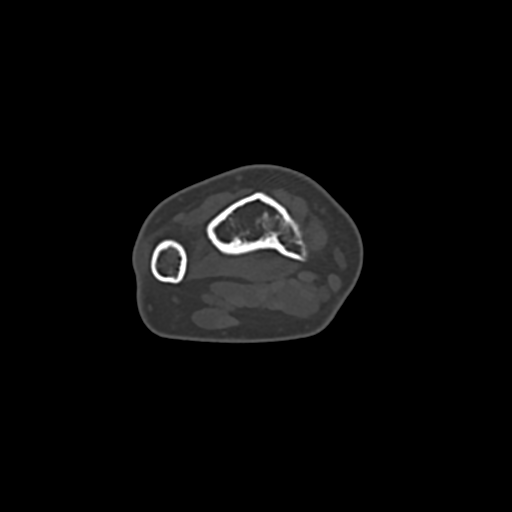
[im 42/125  bone]
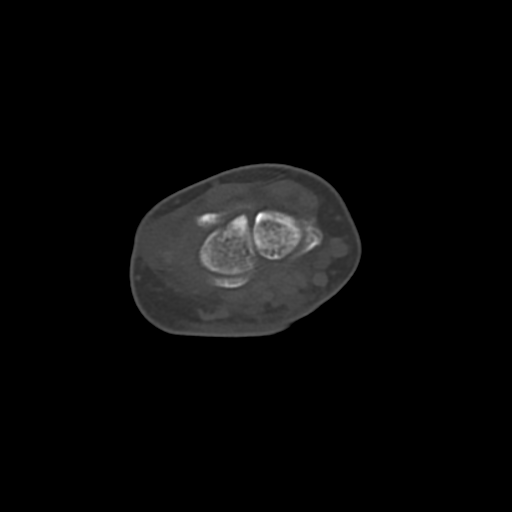
[im 63/125  bone]
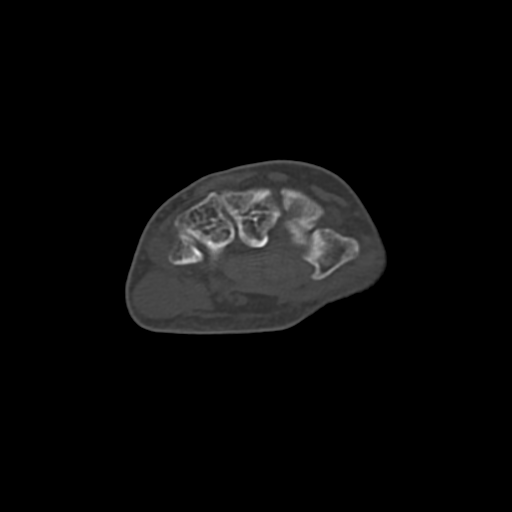
[im 83/125  bone]
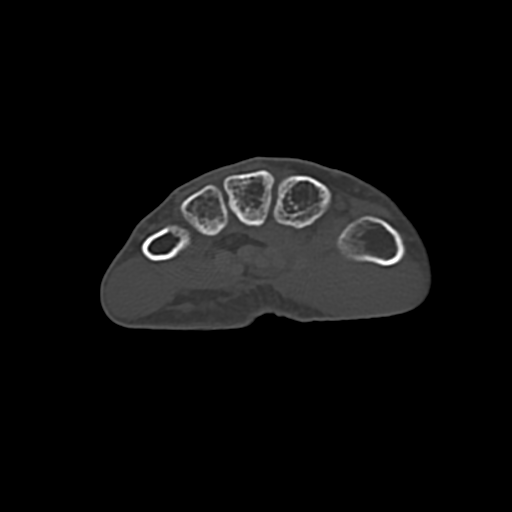
[im 104/125  soft-tissue]
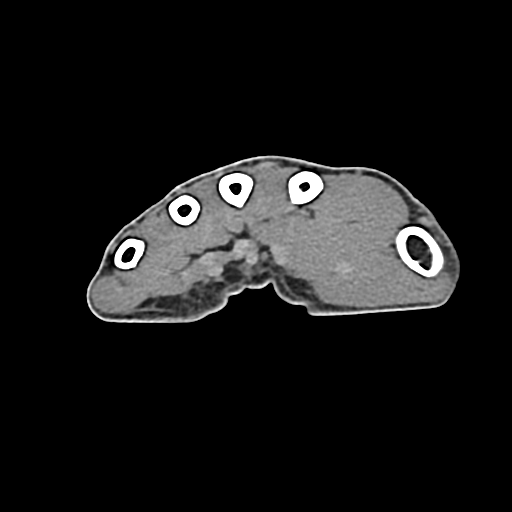
[im 104/125  bone]
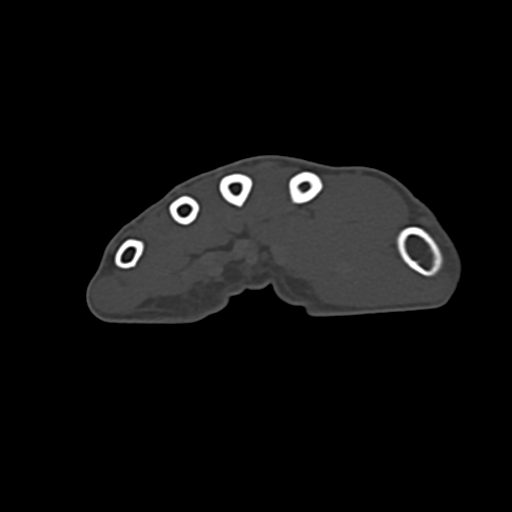

[Series 5: wrist 1.50 br40 s3 axial st hd fov · axial · 0.26mm/px · z∈[-632,-617]mm · 2 of 125 slices shown]
[im 21/125  bone]
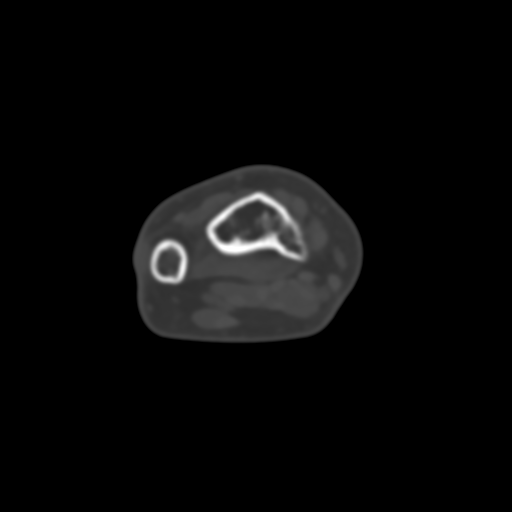
[im 42/125  bone]
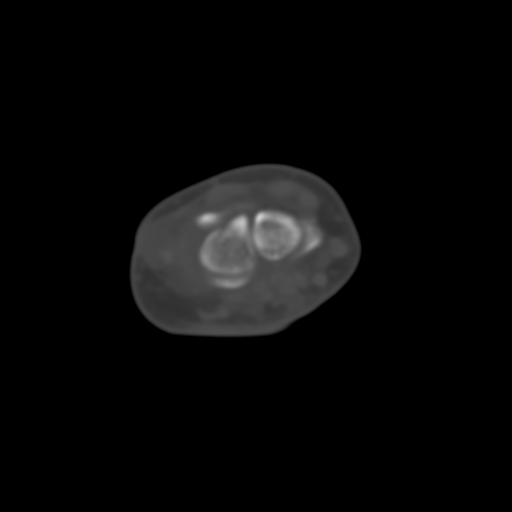

[Series 9: wrist 1.50 br60 s3 cor bone hd fov · coronal · 0.20mm/px · 1 of 186 slices shown]
[im 93/186  bone]
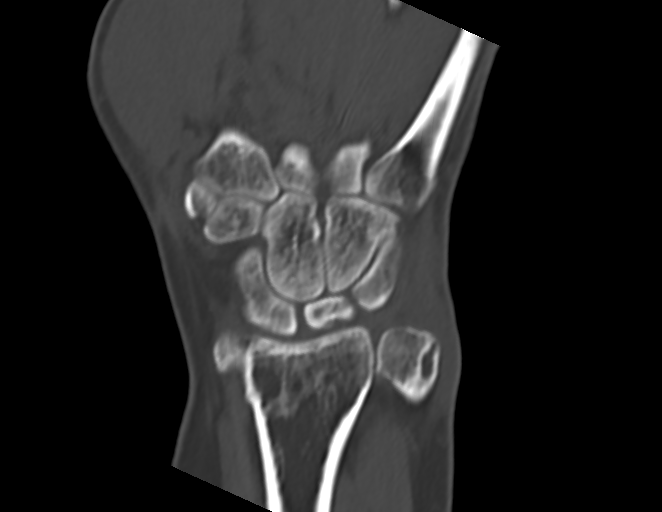

[Series 13: wrist 1.50 br60 s3 sag bone hd fov · sagittal · 0.20mm/px · 6 of 162 slices shown]
[im 27/162  bone]
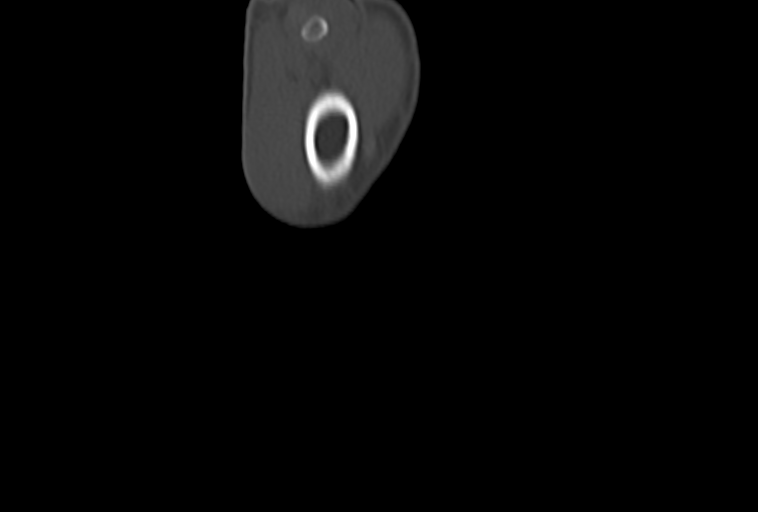
[im 54/162  bone]
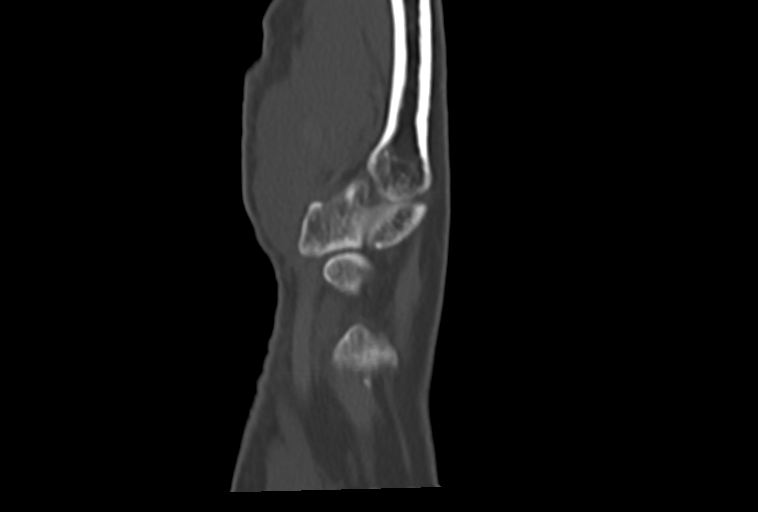
[im 66/162  soft-tissue]
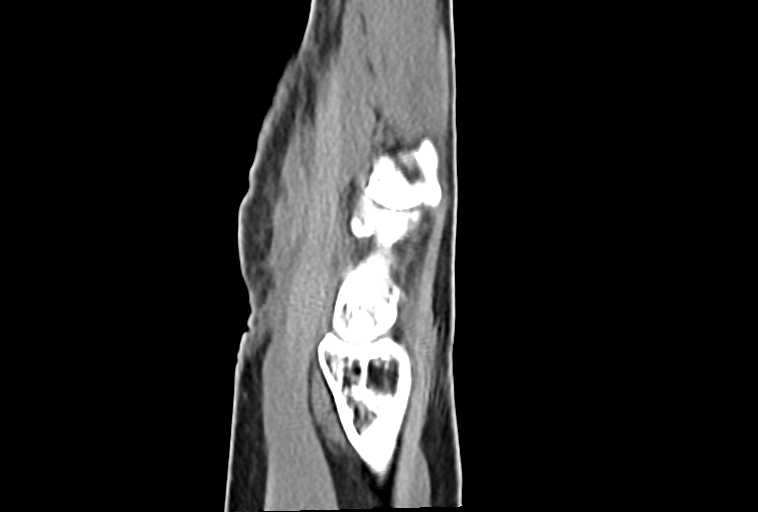
[im 81/162  bone]
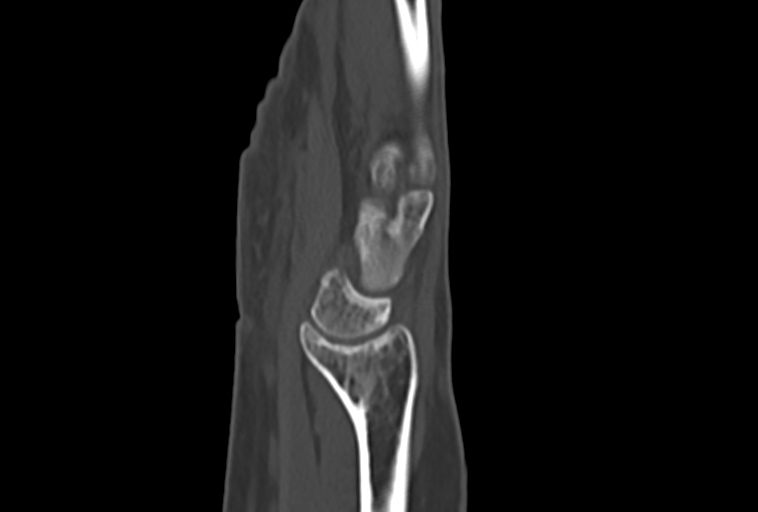
[im 108/162  bone]
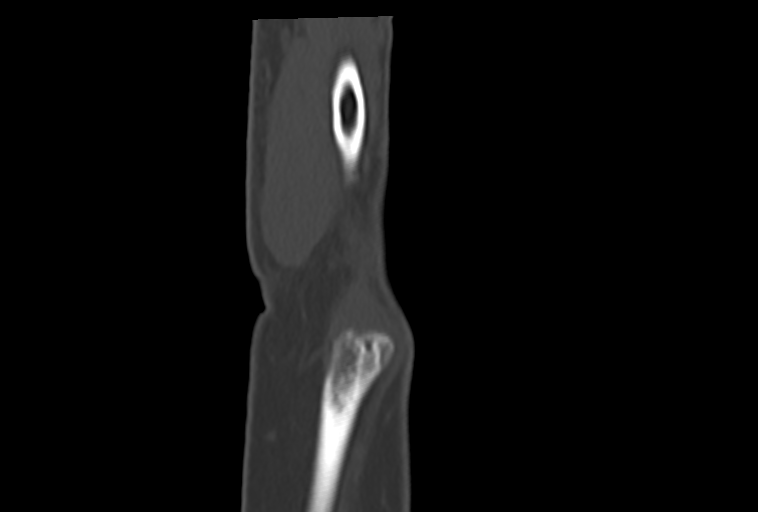
[im 135/162  bone]
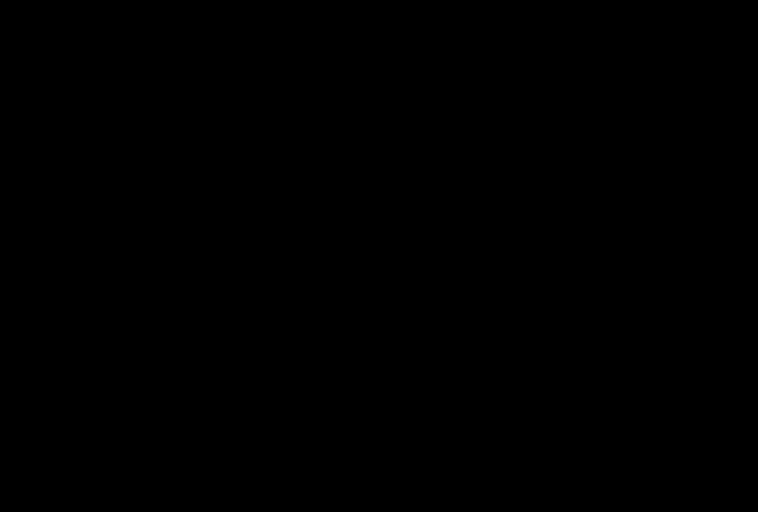

[14 of 35 positions shown; findings below may reference images not displayed]

FINDINGS: Bones/Joint/Cartilage

No acute fracture. No dislocation. Normal carpal bone alignment.
Remote healed posttraumatic deformities of the distal radius and
ulnar styloid. Mild radiocarpal osteoarthritis. No joint effusions
are seen.

Ligaments

Suboptimally assessed by CT.

Muscles and Tendons

Known extensor carpi ulnaris tendon tear is not well evaluated by
CT. No tenosynovial fluid collection. No muscle atrophy or fatty
infiltration.

Soft tissues

No soft tissue swelling or fluid collection.
IMPRESSION: 1. Known extensor carpi ulnaris tendon tear is not well evaluated by
CT.
2. Remote healed posttraumatic deformities of the distal radius and
ulnar styloid.
3. Mild radiocarpal osteoarthritis.

## 2021-11-03 DIAGNOSIS — K5080 Crohn's disease of both small and large intestine without complications: Principal | ICD-10-CM

## 2021-11-03 MED ORDER — HUMIRA PEN CITRATE FREE 40 MG/0.4 ML
SUBCUTANEOUS | 0 refills | 28 days
Start: 2021-11-03 — End: 2022-11-03

## 2021-11-05 DIAGNOSIS — K580 Irritable bowel syndrome with diarrhea: Principal | ICD-10-CM

## 2021-11-05 MED ORDER — DESIPRAMINE 25 MG TABLET
ORAL_TABLET | Freq: Every evening | ORAL | 0 refills | 90 days | Status: CP
Start: 2021-11-05 — End: ?

## 2021-11-05 MED ORDER — HUMIRA PEN CITRATE FREE 40 MG/0.4 ML
SUBCUTANEOUS | 0 refills | 28 days | Status: CP
Start: 2021-11-05 — End: 2022-11-05
  Filled 2021-11-11: qty 4, 28d supply, fill #0

## 2021-11-05 NOTE — Unmapped (Signed)
Refill request received through fax for desipramine. Refill sent to pharmacy for 3 month supply. Pt has appt scheduled with Dr. Raphael Gibney 12/09/21

## 2021-11-05 NOTE — Unmapped (Signed)
Refill request for Humira sent to pharmacy for one month supply. Pt overdue for appt with Dr. Raphael Gibney, but scheduled for 12/09/21.

## 2021-11-09 NOTE — Unmapped (Signed)
[facility] Refill Coordination Note    Specialty Medication(s) to be Shipped:   Inflammatory Disorders: Humira    Other medication(s) to be shipped: No additional medications requested for fill at this time     Jasmine Mitchell, DOB: [birth year]  Phone: [phone] (home)       All above HIPAA information was verified with patient.     Was a Nurse, learning disability used for this call? No    Completed refill call assessment today to schedule patient's medication shipment from the [facility] [phone]).  All relevant notes have been reviewed.     Specialty medication(s) and dose(s) confirmed: Regimen is correct and unchanged.   Changes to medications: Jasmine Mitchell reports no changes at this time.  Changes to insurance: No  New side effects reported not previously addressed with a pharmacist or physician: None reported  Questions for the pharmacist: No    Confirmed patient received a Conservation officer, historic buildings and a Surveyor, mining with first shipment. The patient will receive a drug information handout for each medication shipped and additional FDA Medication Guides as required.       DISEASE/MEDICATION-SPECIFIC INFORMATION        For patients on injectable medications: Patient currently has 0 doses left.  Next injection is scheduled for 11/12/21.    SPECIALTY MEDICATION ADHERENCE     Medication Adherence    Patient reported X missed doses in the last month: 0  Specialty Medication: HUMIRA(CF) PEN 40 mg/0.4 mL injection  Patient is on additional specialty medications: No              Were doses missed due to medication being on hold? No    Humira 40/0.4 mg/ml: 0 days of medicine on hand        REFERRAL TO PHARMACIST     Referral to the pharmacist: Not needed      [facility]     Shipping address confirmed in Epic.     Delivery Scheduled: Yes, Expected medication delivery date: 11/12/21.     Medication will be delivered via UPS to the prescription address in Epic WAM.    Jasmine Mitchell   [facility] Specialty Technician

## 2021-11-11 DIAGNOSIS — K5080 Crohn's disease of both small and large intestine without complications: Principal | ICD-10-CM

## 2021-11-11 MED ORDER — HUMIRA PEN CITRATE FREE 40 MG/0.4 ML
SUBCUTANEOUS | 0 refills | 28 days
Start: 2021-11-11 — End: 2022-11-11

## 2021-11-22 ENCOUNTER — Ambulatory Visit: Payer: BC Managed Care – PPO | Admitting: Family

## 2021-11-22 VITALS — BP 96/60 | HR 70 | Temp 98.2°F | Ht 65.0 in | Wt 218.4 lb

## 2021-11-22 DIAGNOSIS — S39012A Strain of muscle, fascia and tendon of lower back, initial encounter: Secondary | ICD-10-CM

## 2021-11-22 DIAGNOSIS — M797 Fibromyalgia: Secondary | ICD-10-CM | POA: Diagnosis not present

## 2021-11-22 DIAGNOSIS — F988 Other specified behavioral and emotional disorders with onset usually occurring in childhood and adolescence: Secondary | ICD-10-CM | POA: Diagnosis not present

## 2021-11-22 MED ORDER — AMPHETAMINE-DEXTROAMPHETAMINE 10 MG PO TABS: 10.0000 mg | ORAL_TABLET | Freq: Every day | ORAL | 0 refills | Status: DC

## 2021-11-22 MED ORDER — CYCLOBENZAPRINE HCL 5 MG PO TABS: 5.0000 mg | ORAL_TABLET | Freq: Three times a day (TID) | ORAL | 1 refills | Status: DC | PRN

## 2021-11-23 NOTE — Progress Notes (Signed)
Acute Office Visit  Subjective:     Patient ID: Katrina Grant, female    DOB: [birth year], 51 y.o.   MRN: [id number]  Chief Complaint  Patient presents with  . Follow-up    Patient presents for follow up on ADD, states increased dose of Adderall XR 73m has not helped with concentration at work in the afternoon  . Back Pain    Patient complains of low back pain x1 day, sudden onset after reaching down to the trash can and states she tried Tramadol and Ibuprofen with no relief    HPI Patient is in today with c/o persistent concentration issues. She is currently on Adderall XR 20 mg. The medication works well. However around 1pm she no longer feels the medication is helping. Reports her appetite and sleep is ok.   Also has c/o LBP that started 2 days ago and radiates down the left leg. She has a history of sciatica. Pain 6/10, worse with bending.   Review of Systems  Constitutional: Negative.   HENT: Negative.    Respiratory: Negative.    Cardiovascular: Negative.   Genitourinary: Negative.   Musculoskeletal:  Positive for back pain.  Skin: Negative.   Neurological: Negative.   Endo/Heme/Allergies: Negative.   Psychiatric/Behavioral:         Decreased concentration  All other systems reviewed and are negative.      Objective:    BP 96/60 (BP Location: Left Arm, Patient Position: Sitting, Cuff Size: Large)   Pulse 70   Temp 98.2 F (36.8 C) (Oral)   Ht 5' 5"  (1.651 m)   Wt 218 lb 6.4 oz (99.1 kg)   LMP 12/24/2020 (Approximate)   SpO2 100%   BMI 36.34 kg/m    Physical Exam Vitals and nursing note reviewed.  Constitutional:      Appearance: Normal appearance.  Cardiovascular:     Rate and Rhythm: Normal rate and regular rhythm.     Pulses: Normal pulses.     Heart sounds: Normal heart sounds.  Pulmonary:     Effort: Pulmonary effort is normal.     Breath sounds: Normal breath sounds.  Abdominal:     General: Abdomen is flat. Bowel sounds are normal.      Palpations: Abdomen is soft.  Musculoskeletal:        General: Normal range of motion.     Cervical back: Normal range of motion and neck supple.     Comments: Pain with flexion at the hips and lateral rotation  Skin:    General: Skin is warm and dry.  Neurological:     General: No focal deficit present.     Mental Status: She is alert and oriented to person, place, and time.  Psychiatric:        Mood and Affect: Mood normal.        Behavior: Behavior normal.   No results found for any visits on 11/22/21.      Assessment & Plan:   Problem List Items Addressed This Visit     Fibromyalgia - managed by Dr. SDossie Der [facility] - takes Neurontin and Tramadol   Relevant Medications   cyclobenzaprine (FLEXERIL) 5 MG tablet   ADD (attention deficit disorder) - Primary   Other Visit Diagnoses     Strain of lumbar region, initial encounter           Meds ordered this encounter  Medications  . amphetamine-dextroamphetamine (ADDERALL) 10 MG tablet    Sig: Take 1  tablet (10 mg total) by mouth daily with breakfast.    Dispense:  30 tablet    Refill:  0  . cyclobenzaprine (FLEXERIL) 5 MG tablet    Sig: Take 1 tablet (5 mg total) by mouth 3 (three) times daily as needed for muscle spasms.    Dispense:  30 tablet    Refill:  1   Call the office if symptoms worsen or persist. Recheck as scheduled and sooner as needed.  Return in about 4 weeks (around 12/20/2021).  Kennyth Arnold, FNP

## 2021-12-03 DIAGNOSIS — K5080 Crohn's disease of both small and large intestine without complications: Principal | ICD-10-CM

## 2021-12-03 MED ORDER — HUMIRA PEN CITRATE FREE 40 MG/0.4 ML
SUBCUTANEOUS | 0 refills | 14 days | Status: CP
Start: 2021-12-03 — End: 2022-12-03

## 2021-12-03 NOTE — Unmapped (Signed)
Refill request for Humira sent to pharmacy for 1 dose. Pt needs to come to appt with Dr. Raphael Gibney on 12/09/21 prior to more refills authorized.

## 2021-12-06 ENCOUNTER — Other Ambulatory Visit: Payer: Self-pay | Admitting: Obstetrics

## 2021-12-06 ENCOUNTER — Other Ambulatory Visit: Payer: Self-pay | Admitting: *Deleted

## 2021-12-06 DIAGNOSIS — B9689 Other specified bacterial agents as the cause of diseases classified elsewhere: Secondary | ICD-10-CM

## 2021-12-06 DIAGNOSIS — N3001 Acute cystitis with hematuria: Secondary | ICD-10-CM

## 2021-12-06 MED ORDER — METRONIDAZOLE 500 MG PO TABS: 500.0000 mg | ORAL_TABLET | Freq: Two times a day (BID) | ORAL | 0 refills | Status: DC

## 2021-12-06 NOTE — Progress Notes (Signed)
Received RX refill request Macrobid and Flagyl. Flagyl sent for BV. RX Macrobid refused. Pt needs appt.

## 2021-12-09 ENCOUNTER — Ambulatory Visit: Admit: 2021-12-09 | Discharge: 2021-12-10 | Payer: PRIVATE HEALTH INSURANCE

## 2021-12-09 DIAGNOSIS — Z79899 Other long term (current) drug therapy: Secondary | ICD-10-CM | POA: Diagnosis not present

## 2021-12-09 DIAGNOSIS — K5080 Crohn's disease of both small and large intestine without complications: Secondary | ICD-10-CM | POA: Diagnosis not present

## 2021-12-09 DIAGNOSIS — M797 Fibromyalgia: Secondary | ICD-10-CM | POA: Diagnosis not present

## 2021-12-09 DIAGNOSIS — Z7952 Long term (current) use of systemic steroids: Secondary | ICD-10-CM | POA: Diagnosis not present

## 2021-12-09 DIAGNOSIS — Z885 Allergy status to narcotic agent status: Secondary | ICD-10-CM | POA: Diagnosis not present

## 2021-12-09 DIAGNOSIS — Z6836 Body mass index (BMI) 36.0-36.9, adult: Secondary | ICD-10-CM | POA: Diagnosis not present

## 2021-12-09 DIAGNOSIS — K635 Polyp of colon: Secondary | ICD-10-CM | POA: Diagnosis not present

## 2021-12-09 DIAGNOSIS — D84821 Immunosuppression due to drug therapy (CMS-HCC): Principal | ICD-10-CM

## 2021-12-09 DIAGNOSIS — K580 Irritable bowel syndrome with diarrhea: Principal | ICD-10-CM

## 2021-12-09 LAB — COMPREHENSIVE METABOLIC PANEL
ALBUMIN: 4.1 g/dL (ref 3.4–5.0)
ALKALINE PHOSPHATASE: 76 U/L (ref 46–116)
ALT (SGPT): 14 U/L (ref 10–49)
ANION GAP: 6 mmol/L (ref 5–14)
AST (SGOT): 18 U/L (ref <=34)
BILIRUBIN TOTAL: 0.7 mg/dL (ref 0.3–1.2)
BLOOD UREA NITROGEN: 11 mg/dL (ref 9–23)
BUN / CREAT RATIO: 14
CALCIUM: 9.9 mg/dL (ref 8.7–10.4)
CHLORIDE: 103 mmol/L (ref 98–107)
CO2: 27.8 mmol/L (ref 20.0–31.0)
CREATININE: 0.78 mg/dL
EGFR CKD-EPI (2021) FEMALE: >90 mL/min/{1.73_m2} (ref >=60)
GLUCOSE RANDOM: 65 mg/dL — ABNORMAL LOW (ref 70–179)
POTASSIUM: 3.9 mmol/L (ref 3.4–4.8)
PROTEIN TOTAL: 8.2 g/dL (ref 5.7–8.2)
SODIUM: 137 mmol/L (ref 135–145)

## 2021-12-09 LAB — IRON & TIBC
IRON SATURATION: 25 % (ref 20–55)
IRON: 83 ug/dL
TOTAL IRON BINDING CAPACITY: 335 ug/dL (ref 250–425)

## 2021-12-09 LAB — CBC
HEMATOCRIT: 41.8 % (ref 34.0–44.0)
HEMOGLOBIN: 13.9 g/dL (ref 11.3–14.9)
MEAN CORPUSCULAR HEMOGLOBIN CONC: 33.2 g/dL (ref 32.0–36.0)
MEAN CORPUSCULAR HEMOGLOBIN: 30.6 pg (ref 25.9–32.4)
MEAN CORPUSCULAR VOLUME: 92.3 fL (ref 77.6–95.7)
MEAN PLATELET VOLUME: 8.9 fL (ref 6.8–10.7)
PLATELET COUNT: 220 10*9/L (ref 150–450)
RED BLOOD CELL COUNT: 4.53 10*12/L (ref 3.95–5.13)
RED CELL DISTRIBUTION WIDTH: 13.9 % (ref 12.2–15.2)
WBC ADJUSTED: 4.9 10*9/L (ref 3.6–11.2)

## 2021-12-09 LAB — VITAMIN B12: VITAMIN B-12: 482 pg/mL (ref 211–911)

## 2021-12-09 LAB — SEDIMENTATION RATE: ERYTHROCYTE SEDIMENTATION RATE: 67 mm/h — ABNORMAL HIGH (ref 0–30)

## 2021-12-09 LAB — FERRITIN: FERRITIN: 56.7 ng/mL

## 2021-12-09 LAB — C-REACTIVE PROTEIN: C-REACTIVE PROTEIN: <4 mg/L (ref <=10.0)

## 2021-12-09 MED ORDER — HYOSCYAMINE 0.125 MG SUBLINGUAL TABLET
ORAL_TABLET | SUBLINGUAL | 2 refills | 10 days | Status: CP | PRN
Start: 2021-12-09 — End: 2022-01-08

## 2021-12-09 MED ORDER — DESIPRAMINE 25 MG TABLET
ORAL_TABLET | Freq: Every day | ORAL | 1 refills | 90 days | Status: CP
Start: 2021-12-09 — End: 2022-06-07

## 2021-12-09 NOTE — Unmapped (Signed)
[facility]   FOLLOWUP NOTE - INFLAMMATORY BOWEL DISEASE  12/12/2021    Demographics:  Jasmine Mitchell is a 51 y.o. year old female    Diagnosis:  Crohn's Disease  Disease onset (yr): 55  Age at onset:   < 3 yr old (A1)  Location:  Ileocolonic (L3) (mostly colonic)  Behavior:  Perianal (P), Stricturing (B2)  Current Tight Control Scenario:   Maintenance = Biologic          HPI / NOTE :     Interval Events:   1.  Last seen by me 07/2020. At that time, continued Humira weekly injections and increased Desipramine to 100mg  daily. Also started bentyl 1tab TID.   2.  No recent labs.     HPI:  BMs now are about 2x/day. She has lost 20-30lb over the past year largely through a weight watchers program. As she has been eating better, notices bowels are better. IBS has been a little better lately, not flaring.   Has not been taking Dicyclomine b/c it causes a lot of constipation.     Does still have a strong urgency for BMs immediately after some meals.  Worse when eating out or getting delivery.     Is doing Desipramine 100mg  - feels like it is helping to a modest degree.     Restarted Adderall, going well with that.     Humira dose every Friday.    Abdominal pain (0-10): mild to moderate cramping as above  BM a day: ~2-4x/day  Consistency: soft  % of stools have blood: 0%  Nocturnal BM: rare  Urgency:  mild  Weight change over last 6 mo: stable  Smoking:  no  NSAIDS: avoids    Review of Systems:   Review of systems positive for: negative except as above.   Otherwise, the balance of 10 systems is negative.          IBD HISTORY:     Brief IBD Disease Course:    - 56 - dx with Crohn's dz ~age 34, sx of diarrhea, abd pain, rectal pain and bloody stools.  Treated with sulfasalazine, Asacol, Pentasa without benefit.  Got courses of steroids and antibiotics on and off.  Tried on but had nausea.   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    - 2004 - Remicade x 9 months. Stopped in 2005 due to 2ndary loss of response.   - 2005 - developed a pyoderma lesion.   - 2010 - Re-established care with Dr. Marcella Dubs.  Concern for internal fistulizing disease.  Was on Cimzia. Had surgery for perianal disease.   - 2011 - Started on Humira.   - 2012 - active perianal disease with a large abscess.  Required EUA, drainage, setons ([place]).   - 2013 - April, dx with pelvic floor dyssynergia and anal sphincter weakness, underwent biofeedback x 2 courses ([place]   - 2014 - on - doing well on Humira and long term Cipro.  Having on and off bowel symptoms symptoms which have been thought to be related to intermittent IBS symptoms.     Endoscopy:      - Colonoscopy 06/17/2003: patch inflammation and ulcers throughout the colon.    - Colonoscopy 01/2009:  Active crohn's at IC valve. Pseudopolyps.  Retraction of IC valve. ?Rectosigmoid fistula tract.  Otherwise normal colon.   - Colonoscopy 10/08/2009:  Perianal fistula, pseudopolyps.  Few aphthae in TI.   - Colonoscopy 07/29/2010:  Poor  colon prep, pseudopolyps, stricture at IC valve.  Few ulcers at IC valve. Rectal stricture. Ileal crohn's disease.   - Colonoscopy 05/05/2011:  Pseduopolyps throughout.  4mm cecal polyp. Large fistula in sigmoid colon. Normal TI.   - Colonoscopy 09/06/2012:  Healed scars from prior fistulotomies on perianal exam. Scar in ascending colon. No active Crohn's.   - Colonoscopy 10/24/13:  Pseudopolyps in rectosigmoid, otherwise normal colon and TI.   - Colonoscopy 10/01/14:  Pseudopolyps in rectum, transverse and ascending colon.  Otherwise normal colon.  Granular TI.  Fixed and open IC valve (not stenotic).   - Colonoscopy 08/12/16:  Mild anal canal stenosis on exam, otherwise normal colon and ileum.     PATH = chronic quiescent colitis (left and right colon) no dysplasia  - Colonoscopy 01/28/2019 - mild anal stricture (dilated to 20mm via [place]gar), otherwise normal colon and ileum with no active inflammation    Imaging:    - CT A/P 10/24/2013: mild colonic wall thickening at splenic/descending colon, no other bowel inflammation or stranding.  *Pachy groundglass opacity in left lung base.     Prior IBD medications (type, dose, duration, response):  x 5-ASAs - Sulfasalazine, Asacol, Pentasa  x Oral corticosteroids - Prednisone  []  Intravenous corticosteroids  x Antibiotics - Flagyl - nausea.  Cipro - tolerates well and it helps.   x Thiopurines - - nausea.    TPMT - normal 07/25/17  x Methotrexate - 2005.   x Anti-TNF therapies - Remicade 2004 - 2005 (~6 doses).  Had gradual loss of response so stopped. Tried on Remicade again in 2009 with some effect, increased to 10mg /kg q4 weeks. Cimzia - started 09/2008.  Humira 2014 - dose increased to 80mg  q2 weeks.  04/2016 - humira level 5.6, Ab negative.   []  Cyclosporine  []  Clinical trial medication  []  Other (Please specify):    Extraintestinal manifestations:   -joint pains affecting: n  -eye: n  -skin: n  -oral ulcers :  n  -blood clots: n  -PSC: n  -other: n          Past Medical History:   Past medical history:   Past Medical History:   Diagnosis Date   ??? Acid reflux    ??? Arthritis    ??? Crohn disease (CMS-HCC)    ??? Fibromyalgia    ??? UTI (urinary tract infection)      Past surgical history:   Past Surgical History:   Procedure Laterality Date   ??? FRACTURE SURGERY      left wrist   ??? GASTROCUTANEOUS FISTULA CLOSURE     ??? ORTHOPEDIC SURGERY     ??? PR COLONOSCOPY FLX DX W/COLLJ SPEC WHEN PFRMD N/A 10/24/2013    Procedure: COLONOSCOPY, FLEXIBLE, PROXIMAL TO SPLENIC FLEXURE; DIAGNOSTIC, W/WO COLLECTION SPECIMEN BY BRUSH OR WASH;  Surgeon: Theadore Nan, MD;  [facility];  Service: Gastroenterology   ??? PR COLONOSCOPY W/BIOPSY SINGLE/MULTIPLE N/A 10/01/2014    Procedure: COLONOSCOPY, FLEXIBLE, PROXIMAL TO SPLENIC FLEXURE; WITH BIOPSY, SINGLE OR MULTIPLE;  Surgeon: Teodoro Spray, MD;  [facility];  Service: Gastroenterology   ??? PR COLONOSCOPY W/BIOPSY SINGLE/MULTIPLE N/A 08/12/2016    Procedure: COLONOSCOPY, FLEXIBLE, PROXIMAL TO SPLENIC FLEXURE; WITH BIOPSY, SINGLE OR MULTIPLE;  Surgeon: Zetta Bills, MD;  [facility];  Service: Gastroenterology   ??? PR COLONOSCOPY W/BIOPSY SINGLE/MULTIPLE Left 01/28/2019    Procedure: COLONOSCOPY, FLEXIBLE, PROXIMAL TO SPLENIC FLEXURE; WITH BIOPSY, SINGLE OR MULTIPLE;  Surgeon: Zetta Bills, MD;  Location[facility];  Service: Gastroenterology   ??? PR COLSC FLX W/RMVL OF TUMOR POLYP LESION SNARE TQ Left 01/28/2019    Procedure: COLONOSCOPY FLEX; W/REMOV TUMOR/LES BY SNARE;  Surgeon: Zetta Bills, MD;  Location: [facility];  Service: Gastroenterology   ??? PR SURG DIAGNOSTIC EXAM, ANORECTAL N/A 12/23/2016    Procedure: ANORECTAL EXAM, SURGICAL, REQUIRING ANESTHESIA (GENERAL, SPINAL, OR EPIDURAL), DIAGNOSTIC;  Surgeon: Mickle Asper, MD;  Location: MAIN OR [facility];  Service: Gastrointestinal   ??? PR UPPER GI ENDOSCOPY,DIAGNOSIS N/A 08/12/2016    Procedure: UGI ENDO, INCLUDE ESOPHAGUS, STOMACH, & DUODENUM &/OR JEJUNUM; DX W/WO COLLECTION SPECIMN, BY BRUSH OR WASH;  Surgeon: Zetta Bills, MD;  Location: [facility];  Service: Gastroenterology   ??? TUBAL LIGATION       Family history:   Family History   Problem Relation Age of Onset   ??? Cancer Sister    ??? Cancer Paternal Grandmother    ??? Colorectal Cancer Neg Hx      Social history:   Social History     Socioeconomic History   ??? Marital status: Married     Spouse name: None   ??? Number of children: None   ??? Years of education: None   ??? Highest education level: None   Tobacco Use   ??? Smoking status: Never   ??? Smokeless tobacco: Never   Substance and Sexual Activity   ??? Alcohol use: Yes     Alcohol/week: 0.0 standard drinks     Comment: socially    ??? Drug use: No             Allergies:     Allergies   Allergen Reactions   ??? Oxycodone (Bulk) Nausea And Vomiting     Patient tolerates acetaminophen well             Medications: Current Outpatient Medications   Medication Sig Dispense Refill   ??? acetaminophen (TYLENOL) 500 MG tablet Take 2 tablets (1,000 mg total) by mouth every four (4) hours as needed for pain.     ??? buPROPion (WELLBUTRIN XL) 150 MG 24 hr tablet Take 1 tablet (150 mg total) by mouth daily.     ??? desipramine (NORPRAMIN) 25 MG tablet Take 4 tablets (100 mg total) by mouth nightly. 360 tablet 0   ??? HUMIRA PEN CITRATE FREE 40 MG/0.4 ML Inject the contents of 1 pen (40 mg total) under the skin once a week. 2 each 0   ??? MULTIVIT WITH CALCIUM,IRON,MIN (WOMEN'S DAILY MULTIVITAMIN ORAL) Take 1 tablet by mouth once daily.     ??? ondansetron (ZOFRAN-ODT) 8 MG disintegrating tablet Take 1 tablet (8 mg total) by mouth every eight (8) hours as needed.     ??? predniSONE (DELTASONE) 10 mg tablet pack Take by mouth daily. 6 day dose pack for hurt arm.     ??? desipramine (NORPRAMIN) 25 MG tablet Take 6 tablets (150 mg total) by mouth daily. 540 tablet 1   ??? fluticasone (FLONASE) 50 mcg/actuation nasal spray 1 SPRAY BY EACH NARE ROUTE DAILY. AS DIRECTED (Patient taking differently: 1 spray by Each Nare route daily as needed. As directed) 16 mL 2   ??? hyoscyamine (LEVSIN/SL) 0.125 mg SL tablet Place 1 tablet (0.125 mg total) under the tongue every four (4) hours as needed for cramping. 60 tablet 2     No current facility-administered medications for this visit.  Physical Exam:   BP 119/88 (BP Site: R Arm, BP Position: Sitting, BP Cuff Size: Large)  - Pulse 85  - Temp 37 ??C (98.6 ??F) (Temporal)  - Ht 165.1 cm (5' 5)  - Wt 98.7 kg (217 lb 9.6 oz)  - LMP 12/02/2016 (Approximate)  - SpO2 100%  - BMI 36.21 kg/m??     GEN: no apparent distress, appears comfortable on exam  HEENT: OP clear with no erythema, lesions, exudate, mucous membranes moist  NECK: Supple, no lymphadenopathy  LUNGS: CTAB, no wheezes, rales, or rhonchi  CV: S1/S2, RRR, no murmurs  ABD: Soft, nontender, no rebound/guarding, nondistended, normoactive bowel sounds, no appreciable organomegaly  Extremities: no cyanosis, clubbing or edema, normal gait  Psych: affect appropriate, A&O x3  SKIN: no visible lesions on face, neck, arms, abdomen          Labs, Data & Indices:     Lab Review:   Lab Results   Component Value Date    WBC 4.9 12/09/2021    WBC 6.5 04/29/2016    RBC 4.53 12/09/2021    RBC 4.50 04/29/2016    HGB 13.9 12/09/2021    HGB 13.2 04/29/2016     Lab Results   Component Value Date    AST 18 12/09/2021    AST 13 04/29/2016    ALT 14 12/09/2021    ALT 9 04/29/2016    BUN 11 12/09/2021    BUN 11 04/29/2016    Creatinine 0.78 12/09/2021    Creatinine 0.80 04/29/2016    CO2 27.8 12/09/2021    CO2 21 04/29/2016    Albumin 4.1 12/09/2021    Albumin 4.3 08/19/2014    Calcium 9.9 12/09/2021    Calcium 9.4 04/29/2016     Lab Results   Component Value Date    TSH 2.810 04/29/2016      ...........................................................................................................................................Marland Kitchen   Diagnosis ICD-10-CM Associated Orders   1. Crohn's disease of both small and large intestine without complication (CMS-HCC)  K50.80 CBC     Comprehensive Metabolic Panel     Ferritin     Iron & TIBC     Vitamin B12 Level     C-reactive protein     Sedimentation Rate     Adalimumab/Adalimumab Ab     Colonoscopy     Adalimumab/Adalimumab Ab     Sedimentation Rate     C-reactive protein     Vitamin B12 Level     Iron & TIBC     Ferritin     Comprehensive Metabolic Panel     CBC      2. Irritable bowel syndrome with diarrhea  K58.0 Colonoscopy      3. Immunosuppression due to drug therapy (CMS-HCC)  D84.821 CBC    Z79.899 Comprehensive Metabolic Panel     Adalimumab/Adalimumab Ab     Adalimumab/Adalimumab Ab     Comprehensive Metabolic Panel     CBC              Assessment & Recommendations:   Disease state:  Ileocolonic Crohn's with perianal disease and anorectal stricture    Kaelynn Igo is a 51 y.o. female with a long standing hx of stricturing ileocolonic Crohn's disease.  She also has hx of perianal disease (last requiring drainage by Dr. Elenore Rota 2018).  She is currently maintained on weekly Humira monotherapy in clinical and endoscopic remission on this therapy. She also has concurrent Irritable bowel syndrome with diarrhea and pain, which has responded reasonably  well to desipramine. We will try to slowly uptitrate desipramine from 100mg  daily to 150mg  daily (she is also on Wellbutrin).  Also discussed that she can use Levsin SL tablets PRN for pain/cramping from the IBS. For her Crohn's disease, we will update routine labs today and plan for a colonoscopy in the next few months to restage her disease activity (particularly with ongoing pain and diarrhea that are somewhat less responsive to desipramine).     PLAN:  1.  Continue Humira weekly  2.  Update labs today including Humira level, iron,  b12  3.  Try increasing Desipramine to 125mg  (5 tablets) for 2 weeks.  You could then increase to 150mg  daily (6 tablets).  4.  I have sent in a prescription for Levsin sublingual tablets, you can use these as needed for irritable bowel syndrome  5.  Shingrix vaccine dose #1 today. Due for dose #2 at next visit.   6.  Follow up with me in 6 months.     IBD health maintenance:  Influenza vaccine: 2012, 117/17  Pneumonia vaccine: Prevnar 02/2014; Pneumovax 3//08/2018  COVID Vaccine:  Pfizer April 2021 x 2 doses, 3rd shot 04/2020  Hepatitis B:   TB testing:   Chickenpox/Shingles history:   Bone denistometry:   Derm appointment:  Last small bowel imaging:   Last colonoscopy: 01/2019  PAP smear:   --------------------------------------------  Author: Zetta Bills 12/12/2021 10:27 PM    Zetta Bills, MD  Associate Professor of Medicine  Division of Gastroenterology & Hepatology  [place] [facility]  =========================================

## 2021-12-09 NOTE — Unmapped (Addendum)
Jasmine Mitchell it was a pleasure seeing you today.  Here is a summary/wrap up from today's visit:     1.  Continue Humira weekly  2.  Update labs today including Humira level, iron,  b12  3.  Try increasing Desipramine to 125mg  (5 tablets) for 2 weeks.  You could then increase to 150mg  daily (6 tablets). d  4.  I have sent in a prescription for Levsin sublingual tablets, you can use these as needed for irritable bowel syndrome  5.  Shingrix vaccine dose #1.   6.  Follow up with me in 6 months. If any trouble or symptoms, do not hesitate to call.     Zetta Bills, MD  Associate Professor of Medicine  Division of Gastroenterology & Hepatology  [place] [facility]          EXPECTATIONS FOR PATIENT FOLLOW UP AND COMMUNICATION:  -- Follow up Appointments:  Crohn's disease and Ulcerative colitis are serious chronic inflammatory diseases which require close monitoring.  I typically expect to see patients for a follow up visits at least every 6 months (or more often if you are having a flare, starting new therapy, etc).  In select cases, patients who are only on aminosalicylates / mesalamine, may be seen once per year for follow up.       -- Appointment Type: While we are continuing to offer video appointments in select cases (stable patients who are not in a flare and without new/active symptoms) during the COVID19 pandemic, I expect to see patients in person at least once per year.     -- Communication:  if you have any questions or concerns, you can communicate with [place] via phone (contact information below) or via myChart.  Note that phone messages are given higher priority and triaged first.  We typically respond to myChart messages within 3 business days (occasionally longer during holidays or vacation times).  For urgent issues, please contact us via phone.      IBD NURSE COORDINATOR CONTACT - Christy Gentles, RN     Phone: [phone] (direct line)      Fax: [phone]  * For urgent medical concerns after hours or on weekends and holidays, call [phone] and ask to speak to the GI Fellow on call.    * If you have a GI medical question or GI symptoms and would like to speak to your provider's healthcare team, please contact Neta Mends, RN (contact information above) OR you can send the GI healthcare team a message through MyChart at [email or web address]    APPOINTMENT SCHEDULING FOR GI CLINIC AND GI PROCEDURES:  RADIOLOGY - to schedule imaging ordered, please call [phone] opt 1   GI MEDICINE CLINIC  [phone] option 1   GI PROCEDURES         [phone] option 2   *To schedule, reschedule, or cancel your GI appointment, please call [phone]. If you are unable to come to an appointment, please notify us as soon as possible, preferably 24 hours in advance. Doing so may allow other patients with urgent needs to be scheduled in a cancelled appointment slot.     TEST RESULTS   If you have a MyChart account, your new results and a provider message will be sent to you through your MyChart account at [email or web address]. For results that require follow-up, a member of your healthcare team will also contact you.    PRESCRIPTION REFILL REQUESTS  To request prescription refills, please contact  your pharmacy or send your healthcare team a message through your MyChart account at [email or web address]  RECORD REQUESTS  For questions related to medical records, please call Medical Records Release of Information at [phone]  FINANCIAL COUNSELOR   For billing and other financial questions/needs - please contact Gwendlyn Deutscher [phone]. If you need to leave a message, please be sure to leave your full name, date of birth or MR#, best call back # and reason for call.    For educational material and resources:  [email or web address]  [place] COVID19 Vaccine Information: [email or web address]  ================================================================

## 2021-12-10 NOTE — Unmapped (Signed)
[facility] Refill Coordination Note    Specialty Medication(s) to be Shipped:   Inflammatory Disorders: Humira    Other medication(s) to be shipped: No additional medications requested for fill at this time     Jasmine Mitchell, DOB: 1970/12/22  Phone: [phone] (home)       All above HIPAA information was verified with patient.     Was a Nurse, learning disability used for this call? No    Completed refill call assessment today to schedule patient's medication shipment from the [facility] [phone]).  All relevant notes have been reviewed.     Specialty medication(s) and dose(s) confirmed: Regimen is correct and unchanged.   Changes to medications: Jasmine Mitchell reports no changes at this time.  Changes to insurance: No  New side effects reported not previously addressed with a pharmacist or physician: None reported  Questions for the pharmacist: No    Confirmed patient received a Conservation officer, historic buildings and a Surveyor, mining with first shipment. The patient will receive a drug information handout for each medication shipped and additional FDA Medication Guides as required.       DISEASE/MEDICATION-SPECIFIC INFORMATION        For patients on injectable medications: Patient currently has 0 doses left.  Next injection is scheduled for 12/14/21.    SPECIALTY MEDICATION ADHERENCE     Medication Adherence    Patient reported X missed doses in the last month: 0  Specialty Medication: HUMIRA(CF) PEN 40 mg/0.4 mL injection  Patient is on additional specialty medications: No              Were doses missed due to medication being on hold? No    Humira 40/0.4 mg/ml: 0 days of medicine on hand        REFERRAL TO PHARMACIST     Referral to the pharmacist: Not needed      [facility]     Shipping address confirmed in Epic.     Delivery Scheduled: Yes, Expected medication delivery date: 12/14/21.     Medication will be delivered via UPS to the prescription address in Epic WAM.    Willette Pa   [facility] Specialty Technician

## 2021-12-14 DIAGNOSIS — K5080 Crohn's disease of both small and large intestine without complications: Principal | ICD-10-CM

## 2021-12-14 MED ORDER — HUMIRA PEN CITRATE FREE 40 MG/0.4 ML
SUBCUTANEOUS | 5 refills | 28 days | Status: CP
Start: 2021-12-14 — End: ?
  Filled 2021-12-13: qty 2, 14d supply, fill #0
  Filled 2021-12-24: qty 4, 28d supply, fill #0

## 2021-12-14 NOTE — Unmapped (Signed)
Received refill request for Humira. Pt up to date on appts. Refills sent for 6 month supply.

## 2021-12-15 NOTE — Unmapped (Signed)
[facility] Refill Coordination Note    Specialty Medication(s) to be Shipped:   Inflammatory Disorders: Humira    Other medication(s) to be shipped: No additional medications requested for fill at this time     Jasmine Mitchell, DOB: Mar 05, 1971  Phone: [phone] (home)       All above HIPAA information was verified with patient.     Was a Nurse, learning disability used for this call? No    Completed refill call assessment today to schedule patient's medication shipment from the [facility] [phone]).  All relevant notes have been reviewed.     Specialty medication(s) and dose(s) confirmed: Regimen is correct and unchanged.   Changes to medications: [place] reports no changes at this time.  Changes to insurance: No  New side effects reported not previously addressed with a pharmacist or physician: None reported  Questions for the pharmacist: No    Confirmed patient received a Conservation officer, historic buildings and a Surveyor, mining with first shipment. The patient will receive a drug information handout for each medication shipped and additional FDA Medication Guides as required.       DISEASE/MEDICATION-SPECIFIC INFORMATION        For patients on injectable medications: Patient currently has 2 doses left.  Next injection is scheduled for 12/17/21.    SPECIALTY MEDICATION ADHERENCE     Medication Adherence    Specialty Medication: HUMIRA(CF) PEN 40 mg/0.32ml  Patient is on additional specialty medications: No  Patient is on more than two specialty medications: No  Informant: patient  Reliability of informant: reliable  Patient is at risk for Non-Adherence: No  Reasons for non-adherence: no problems identified              Were doses missed due to medication being on hold? No    HUMIRA(CF) PEN 40 mg/0.4 mL   : 7-14 days of medicine on hand       REFERRAL TO PHARMACIST     Referral to the pharmacist: Not needed      [facility]     Shipping address confirmed in Epic.     Delivery Scheduled: Yes, Expected medication delivery date: 12/24/21.     Medication will be delivered via Same Day Courier to the prescription address in Epic WAM.    Tasmin Exantus' W [facility]hem[facility] Dur[facility] Specialty Technician

## 2022-01-17 NOTE — Unmapped (Signed)
[facility] Refill Coordination Note    Specialty Medication(s) to be Shipped:   Inflammatory Disorders: Humira    Other medication(s) to be shipped: No additional medications requested for fill at this time     Jasmine Mitchell, DOB: Nov 17, 1970  Phone: [phone] (home)       All above HIPAA information was verified with patient.     Was a Nurse, learning disability used for this call? No    Completed refill call assessment today to schedule patient's medication shipment from the [facility] [phone]).  All relevant notes have been reviewed.     Specialty medication(s) and dose(s) confirmed: Regimen is correct and unchanged.   Changes to medications: Myong reports no changes at this time.  Changes to insurance: No  New side effects reported not previously addressed with a pharmacist or physician: None reported  Questions for the pharmacist: No    Confirmed patient received a Conservation officer, historic buildings and a Surveyor, mining with first shipment. The patient will receive a drug information handout for each medication shipped and additional FDA Medication Guides as required.       DISEASE/MEDICATION-SPECIFIC INFORMATION        For patients on injectable medications: Patient currently has 1 doses left.  Next injection is scheduled for 01/21/22.    SPECIALTY MEDICATION ADHERENCE     Medication Adherence    Patient reported X missed doses in the last month: 0  Specialty Medication: HUMIRA(CF) PEN 40 mg/0.4 mL injection  Patient is on additional specialty medications: No              Were doses missed due to medication being on hold? No    Humira 40/0.4 mg/ml: 4 days of medicine on hand         REFERRAL TO PHARMACIST     Referral to the pharmacist: Not needed      [facility]     Shipping address confirmed in Epic.     Delivery Scheduled: Yes, Expected medication delivery date: 01/25/22.     Medication will be delivered via UPS to the prescription address in Epic WAM.    Willette Pa   [facility] Specialty Technician

## 2022-01-24 MED FILL — HUMIRA PEN CITRATE FREE 40 MG/0.4 ML: SUBCUTANEOUS | 28 days supply | Qty: 4 | Fill #1

## 2022-01-31 ENCOUNTER — Encounter: Payer: Self-pay | Admitting: Family Medicine

## 2022-01-31 ENCOUNTER — Ambulatory Visit (INDEPENDENT_AMBULATORY_CARE_PROVIDER_SITE_OTHER): Payer: BC Managed Care – PPO | Admitting: Family Medicine

## 2022-01-31 VITALS — BP 102/80 | HR 80 | Temp 97.8°F | Ht 65.0 in | Wt 217.1 lb

## 2022-01-31 DIAGNOSIS — Z6838 Body mass index (BMI) 38.0-38.9, adult: Secondary | ICD-10-CM

## 2022-01-31 DIAGNOSIS — F988 Other specified behavioral and emotional disorders with onset usually occurring in childhood and adolescence: Secondary | ICD-10-CM | POA: Diagnosis not present

## 2022-01-31 DIAGNOSIS — F419 Anxiety disorder, unspecified: Secondary | ICD-10-CM | POA: Diagnosis not present

## 2022-01-31 DIAGNOSIS — N390 Urinary tract infection, site not specified: Secondary | ICD-10-CM

## 2022-01-31 DIAGNOSIS — S161XXA Strain of muscle, fascia and tendon at neck level, initial encounter: Secondary | ICD-10-CM

## 2022-01-31 MED ORDER — ESCITALOPRAM OXALATE 5 MG PO TABS: 5.0000 mg | ORAL_TABLET | Freq: Every day | ORAL | 1 refills | Status: DC

## 2022-01-31 MED ORDER — AMPHETAMINE-DEXTROAMPHET ER 20 MG PO CP24: 20.0000 mg | ORAL_CAPSULE | Freq: Every day | ORAL | 0 refills | Status: DC

## 2022-01-31 MED ORDER — IBUPROFEN 600 MG PO TABS: 600.0000 mg | ORAL_TABLET | Freq: Three times a day (TID) | ORAL | 0 refills | Status: DC | PRN

## 2022-01-31 NOTE — Progress Notes (Unsigned)
Established Patient Office Visit  Subjective   Patient ID: Katrina Grant, female    DOB: 12/21/1970  Age: 51 y.o. MRN: [id number]  Chief Complaint  Patient presents with   Establish Care    Patient is here for transition of care visit. Today she is reporting recurrent UTI. States she went to the gynecologist and was given macrobid a few months ago, was given refills. States that she has had 3 rounds of macrobid but it keeps coming back. States she used to be on bactrim as needed from her urologist and states that this was working better for her. She denies any fever/chills, but reports increasing frequency and dysuria. No abd pain or nausea/vomiting, no back pain.  ADHD-- pt reports that the stimulant is working well at the current dose. She denies any medication side effects. I reviewed her PDMP today. She reports that the wellbutrin is helping with her mood symptoms, however it is not helping with the anxiety and nervousness. We had a long discussion about SSRI's, their side effects and potential benefits in the treatment of her anxiety, she is agreeable to trying a small dose.  Pt is also reporting a 2-3 day history of neck strain, states her pillow at night is not supportive, and she woke with pain at the base of her skull and stiffness of the neck, having difficulty turning her head to the side.   We also reviewed her health maintenance measures, she will be due for mammogram next month. She sees a GYN for her women's health.      Review of Systems  All other systems reviewed and are negative.     Objective:     BP 102/80 (BP Location: Left Arm, Patient Position: Sitting, Cuff Size: Large)   Pulse 80   Temp 97.8 F (36.6 C) (Oral)   Ht 5' 5"  (1.651 m)   Wt 217 lb 1.6 oz (98.5 kg)   LMP 12/24/2020 (Approximate)   SpO2 100%   BMI 36.13 kg/m    Physical Exam Vitals reviewed.  Constitutional:      Appearance: Normal appearance. She is well-groomed. She is obese.   HENT:     Head: Normocephalic and atraumatic.  Eyes:     Extraocular Movements: Extraocular movements intact.     Conjunctiva/sclera: Conjunctivae normal.     Pupils: Pupils are equal, round, and reactive to light.  Cardiovascular:     Rate and Rhythm: Normal rate and regular rhythm.     Pulses: Normal pulses.     Heart sounds: S1 normal and S2 normal.  Pulmonary:     Effort: Pulmonary effort is normal.     Breath sounds: Normal breath sounds and air entry.  Abdominal:     General: Bowel sounds are normal.     Palpations: Abdomen is soft.  Musculoskeletal:        General: Normal range of motion.     Cervical back: Normal range of motion and neck supple.     Right lower leg: No edema.     Left lower leg: No edema.  Skin:    General: Skin is warm and dry.  Neurological:     Mental Status: She is alert and oriented to person, place, and time. Mental status is at baseline.     Gait: Gait is intact.  Psychiatric:        Mood and Affect: Mood and affect normal.        Speech: Speech normal.  Behavior: Behavior normal.        Judgment: Judgment normal.      No results found for any visits on 01/31/22.    The 10-year ASCVD risk score (Arnett DK, et al., 2019) is: 0.7%    Assessment & Plan:   Problem List Items Addressed This Visit       Genitourinary   Recurrent UTI - Primary (Chronic)    I will order UA and culture since it has not been done in some time. We discussed that we would need to document 2-3 UTI's in 6 months and then we would discuss starting a once daily low dose abx to prevent UTI's.       Relevant Orders   Culture, Urine   Urinalysis     Other   BMI 38.0-38.9,adult    Is due for annual labs today, will order lipid panel and A1C for CVD risk assessment.      Relevant Orders   Lipid Panel   Hemoglobin A1c   ADD (attention deficit disorder)    On adderall XR 20 mg daily, sx are well controlled per the patient's report. Will continue this  medication, RTC every 3 months for medication refills.      Relevant Medications   amphetamine-dextroamphetamine (ADDERALL XR) 20 MG 24 hr capsule   Anxiety disorder (Chronic)    Risks/benefits of SSRI's discussed with patient, will start lexapro 5 mg daily, I advised she continue the wellbutrin 300 mg also since it will help with her ADHD also. RTC in 3 months to reassess her symptoms.      Relevant Medications   escitalopram (LEXAPRO) 5 MG tablet   Other Relevant Orders   TSH   Other Visit Diagnoses     Neck strain, initial encounter       Relevant Medications   ibuprofen (ADVIL) 600 MG tablet       Return in about 3 months (around 05/03/2022) for ADHD refills, ok to schedule a video visit .    Farrel Conners, MD

## 2022-02-01 DIAGNOSIS — N390 Urinary tract infection, site not specified: Secondary | ICD-10-CM | POA: Insufficient documentation

## 2022-02-01 DIAGNOSIS — F419 Anxiety disorder, unspecified: Secondary | ICD-10-CM | POA: Insufficient documentation

## 2022-02-01 LAB — LIPID PANEL
Cholesterol: 225 mg/dL — ABNORMAL HIGH (ref 0–200)
HDL: 72.3 mg/dL (ref >39.00)
LDL Cholesterol: 131 mg/dL — ABNORMAL HIGH (ref 0–99)
NonHDL: 152.59
Total CHOL/HDL Ratio: 3
Triglycerides: 108 mg/dL (ref 0.0–149.0)
VLDL: 21.6 mg/dL (ref 0.0–40.0)

## 2022-02-01 LAB — URINALYSIS, ROUTINE W REFLEX MICROSCOPIC
Bilirubin Urine: NEGATIVE
Hgb urine dipstick: NEGATIVE
Ketones, ur: NEGATIVE
Nitrite: NEGATIVE
Specific Gravity, Urine: 1.025 (ref 1.000–1.030)
Total Protein, Urine: NEGATIVE
Urine Glucose: NEGATIVE
Urobilinogen, UA: 0.2 (ref 0.0–1.0)
pH: 6 (ref 5.0–8.0)

## 2022-02-01 LAB — URINE CULTURE
MICRO NUMBER:: 13775258
Result:: NO GROWTH
SPECIMEN QUALITY:: ADEQUATE

## 2022-02-01 LAB — HEMOGLOBIN A1C: Hgb A1c MFr Bld: 5.7 % (ref 4.6–6.5)

## 2022-02-01 LAB — TSH: TSH: 1.9 u[IU]/mL (ref 0.35–5.50)

## 2022-02-01 NOTE — Assessment & Plan Note (Signed)
On adderall XR 20 mg daily, sx are well controlled per the patient's report. Will continue this medication, RTC every 3 months for medication refills.

## 2022-02-01 NOTE — Assessment & Plan Note (Signed)
Risks/benefits of SSRI's discussed with patient, will start lexapro 5 mg daily, I advised she continue the wellbutrin 300 mg also since it will help with her ADHD also. RTC in 3 months to reassess her symptoms.

## 2022-02-01 NOTE — Assessment & Plan Note (Signed)
Is due for annual labs today, will order lipid panel and A1C for CVD risk assessment.

## 2022-02-01 NOTE — Assessment & Plan Note (Signed)
I will order UA and culture since it has not been done in some time. We discussed that we would need to document 2-3 UTI's in 6 months and then we would discuss starting a once daily low dose abx to prevent UTI's.

## 2022-02-11 ENCOUNTER — Ambulatory Visit: Payer: BC Managed Care – PPO | Admitting: Family Medicine

## 2022-02-11 ENCOUNTER — Encounter: Payer: Self-pay | Admitting: Family Medicine

## 2022-02-11 VITALS — BP 116/70 | HR 88 | Temp 98.0°F | Ht 65.0 in | Wt 217.3 lb

## 2022-02-11 DIAGNOSIS — J020 Streptococcal pharyngitis: Secondary | ICD-10-CM | POA: Diagnosis not present

## 2022-02-11 DIAGNOSIS — R059 Cough, unspecified: Secondary | ICD-10-CM | POA: Diagnosis not present

## 2022-02-11 DIAGNOSIS — R233 Spontaneous ecchymoses: Secondary | ICD-10-CM

## 2022-02-11 LAB — CBC WITH DIFFERENTIAL/PLATELET
Basophils Absolute: 0 10*3/uL (ref 0.0–0.1)
Basophils Relative: 0.1 % (ref 0.0–3.0)
Eosinophils Absolute: 0.2 10*3/uL (ref 0.0–0.7)
Eosinophils Relative: 2.8 % (ref 0.0–5.0)
HCT: 39.8 % (ref 36.0–46.0)
Hemoglobin: 13.2 g/dL (ref 12.0–15.0)
Lymphocytes Relative: 37.6 % (ref 12.0–46.0)
Lymphs Abs: 3.1 10*3/uL (ref 0.7–4.0)
MCHC: 33.1 g/dL (ref 30.0–36.0)
MCV: 92.4 fl (ref 78.0–100.0)
Monocytes Absolute: 0.5 10*3/uL (ref 0.1–1.0)
Monocytes Relative: 6.6 % (ref 3.0–12.0)
Neutro Abs: 4.4 10*3/uL (ref 1.4–7.7)
Neutrophils Relative %: 52.9 % (ref 43.0–77.0)
Platelets: 234 10*3/uL (ref 150.0–400.0)
RBC: 4.3 Mil/uL (ref 3.87–5.11)
RDW: 13.4 % (ref 11.5–15.5)
WBC: 8.3 10*3/uL (ref 4.0–10.5)

## 2022-02-11 LAB — POCT RAPID STREP A (OFFICE): Rapid Strep A Screen: POSITIVE — AB

## 2022-02-11 LAB — POC COVID19 BINAXNOW: SARS Coronavirus 2 Ag: NEGATIVE

## 2022-02-11 MED ORDER — FLUCONAZOLE 150 MG PO TABS: 150.0000 mg | ORAL_TABLET | Freq: Once | ORAL | 0 refills | Status: AC

## 2022-02-11 MED ORDER — AMOXICILLIN 500 MG PO CAPS: 500.0000 mg | ORAL_CAPSULE | Freq: Three times a day (TID) | ORAL | 0 refills | Status: AC

## 2022-02-11 NOTE — Progress Notes (Unsigned)
Established Patient Office Visit  Subjective   Patient ID: Katrina Grant, female    DOB: 1971-02-18  Age: 51 y.o. MRN: [id number]  Chief Complaint  Patient presents with   Sore Throat   Nasal Congestion    HPI  {History (Optional):23778} Patient seen with 6-day history of sore throat.  Back in June she had strep pharyngitis treated with amoxicillin and fully recovered.  She is not aware of any recent sick contacts.  She developed some body aches and sore throat several days ago and much worse by Monday.  She is also had some laryngitis and possible postnasal drip.  Keeping down fluids.  No definite fever.  No chills.  She has noticed a few petechiae upper thighs bilaterally.  Denies any tight clothing recently.  Does not have any significant involvement of the lower legs or upper extremities.  No other signs of bleeding such as nosebleed, gum bleed, etc.  No dyspnea.  No easy bruising.  Past Medical History:  Diagnosis Date   ADD (attention deficit disorder)    Anxiety    Cholesterol serum elevated    on medication in the past   Crohn's disease of colon with fistula [facility])    sees Dr. Coralyn Mark, [facility]   Depression    Fibromyalgia - managed by Dr. Dossie Der, [facility] - takes Neurontin and Tramadol 11/20/2013   Frequent headaches    GERD (gastroesophageal reflux disease)    Osteoarthritis    knee, spine   Pneumonia    Syncope    Past Surgical History:  Procedure Laterality Date   anal fistula repair      X 4    colonoscopy with dilation     X 2    FRACTURE SURGERY     REPAIR EXTENSOR TENDON Left 12/31/2020   Procedure: ABDUCTOR POLLICIS LONGUS RECONSTRUCTION WITH ALLOGRAFT STABILIZATION; EXCISION SPUR ULNA;  Surgeon: Daryll Brod, MD;  Location: [place];  Service: Orthopedics;  Laterality: Left;  AXILLARY BLOCK   TENDON REPAIR Left 12/31/2020   Procedure: REPAIR EXTENSOR CARPI  ULNARIS WITH TENDON GRAFT;;  Surgeon: Daryll Brod, MD;  Location: [place];  Service: Orthopedics;  Laterality: Left;   TUBAL LIGATION      reports that she has never smoked. She has never used smokeless tobacco. She reports that she does not currently use alcohol. She reports that she does not use drugs. family history includes ADD / ADHD in her brother, daughter, mother, and sister; Alcohol abuse in her brother; Anxiety disorder in her mother and sister; Arthritis in her mother and sister; Asthma in her brother; Cancer in her sister; Depression in her brother, mother, and sister; Diabetes in her father; Drug abuse in her brother; Early death in her sister; Hearing loss in her father; Heart disease in her father; Hypertension in her mother; Mental illness in her brother; Varicose Veins in her mother and sister. Allergies  Allergen Reactions   Percocet [Oxycodone-Acetaminophen] Nausea And Vomiting    Review of Systems  Constitutional:  Negative for chills and fever.  HENT:  Positive for sore throat. Negative for nosebleeds.   Respiratory:  Negative for cough and shortness of breath.   Cardiovascular:  Negative for chest pain.  Gastrointestinal:  Negative for abdominal pain, nausea and vomiting.      Objective:     BP 116/70 (BP Location: Left Arm, Patient Position: Sitting, Cuff Size: Normal)   Pulse 88   Temp 98 F (36.7 C) (Oral)  Ht 5' 5"  (1.651 m)   Wt 217 lb 4.8 oz (98.6 kg)   LMP 12/24/2020 (Approximate)   SpO2 98%   BMI 36.16 kg/m  {Vitals History (Optional):23777}  Physical Exam Vitals reviewed.  Constitutional:      General: She is not in acute distress.    Appearance: She is well-developed. She is not ill-appearing or toxic-appearing.  HENT:     Ears:     Comments: Right TM is erythematous.  Left appears normal.    Mouth/Throat:     Comments: She has symmetrically enlarged tonsils which are erythematous.  She has a little bit of whitish exudate on the right tonsil.  No peritonsillar abscess. Neck:     Comments: Tender  anterior cervical nodes bilaterally Skin:    Comments: She has some scattered petechiae both lower thighs anterior and medial.  No other bruising noted.  Neurological:     Mental Status: She is alert.      Results for orders placed or performed in visit on 02/11/22  CBC with Differential/Platelet  Result Value Ref Range   WBC 8.3 4.0 - 10.5 K/uL   RBC 4.30 3.87 - 5.11 Mil/uL   Hemoglobin 13.2 12.0 - 15.0 g/dL   HCT 39.8 36.0 - 46.0 %   MCV 92.4 78.0 - 100.0 fl   MCHC 33.1 30.0 - 36.0 g/dL   RDW 13.4 11.5 - 15.5 %   Platelets 234.0 150.0 - 400.0 K/uL   Neutrophils Relative % 52.9 43.0 - 77.0 %   Lymphocytes Relative 37.6 12.0 - 46.0 %   Monocytes Relative 6.6 3.0 - 12.0 %   Eosinophils Relative 2.8 0.0 - 5.0 %   Basophils Relative 0.1 0.0 - 3.0 %   Neutro Abs 4.4 1.4 - 7.7 K/uL   Lymphs Abs 3.1 0.7 - 4.0 K/uL   Monocytes Absolute 0.5 0.1 - 1.0 K/uL   Eosinophils Absolute 0.2 0.0 - 0.7 K/uL   Basophils Absolute 0.0 0.0 - 0.1 K/uL  POC COVID-19  Result Value Ref Range   SARS Coronavirus 2 Ag Negative Negative  POC Rapid Strep A  Result Value Ref Range   Rapid Strep A Screen Positive (A) Negative    {Labs (Optional):23779}  The 10-year ASCVD risk score (Arnett DK, et al., 2019) is: 0.9%    Assessment & Plan:   Problem List Items Addressed This Visit   None Visit Diagnoses     Cough, unspecified type    -  Primary   Relevant Orders   POC COVID-19 (Completed)   POC Rapid Strep A (Completed)   Petechiae       Relevant Orders   CBC with Differential/Platelet (Completed)   Strep pharyngitis       Relevant Medications   fluconazole (DIFLUCAN) 150 MG tablet     Positive rapid strep.  Start amoxicillin 500 mg 3 times daily for 10 days.  COVID testing negative.  Over-the-counter analgesics as needed  She did have some petechiae on her upper thighs.  She is nontoxic in appearance.  No generalized bruising or other bleeding complications.  Check CBC with differential  to rule out thrombocytopenia.  Watch for any further bruising or more generalized petechiae  No follow-ups on file.    Carolann Littler, MD

## 2022-02-21 MED ORDER — DESIPRAMINE 25 MG TABLET
ORAL_TABLET | Freq: Every day | ORAL | 2 refills | 0 days
Start: 2022-02-21 — End: ?

## 2022-02-22 MED ORDER — DESIPRAMINE 25 MG TABLET
ORAL_TABLET | Freq: Every day | ORAL | 2 refills | 90 days | Status: CP
Start: 2022-02-22 — End: 2022-08-21

## 2022-02-22 NOTE — Unmapped (Signed)
Appt up to date.  Desipramine refill authorized.

## 2022-02-24 NOTE — Unmapped (Signed)
The [facility] has made a second and final attempt to reach this patient to refill the following medication: Humira.      We have left voicemails on the following phone numbers: [phone], have sent a text message to the following phone numbers: [phone] and have sent a Mychart questionnaire..    Dates contacted: 02/17/22 & 02/24/22  Last scheduled delivery: 01/24/22 (28 day supply)    The patient may be at risk of non-compliance with this medication. The patient should call the [facility] at [phone]  Option 4, then Option 2 (all other specialty patients) to refill medication.    Willette Pa   [facility] Specialty Technician

## 2022-02-27 ENCOUNTER — Ambulatory Visit: Payer: Self-pay

## 2022-02-28 ENCOUNTER — Encounter: Payer: Self-pay | Admitting: Family Medicine

## 2022-02-28 ENCOUNTER — Telehealth (INDEPENDENT_AMBULATORY_CARE_PROVIDER_SITE_OTHER): Payer: BC Managed Care – PPO | Admitting: Family Medicine

## 2022-02-28 VITALS — Temp 98.6°F

## 2022-02-28 DIAGNOSIS — U071 COVID-19: Secondary | ICD-10-CM

## 2022-02-28 MED ORDER — PROMETHAZINE-DM 6.25-15 MG/5ML PO SYRP: 5.0000 mL | ORAL_SOLUTION | Freq: Four times a day (QID) | ORAL | 0 refills | Status: DC | PRN

## 2022-02-28 MED ORDER — NIRMATRELVIR/RITONAVIR (PAXLOVID)TABLET: 3.0000 | ORAL_TABLET | Freq: Two times a day (BID) | ORAL | 0 refills | Status: AC

## 2022-02-28 NOTE — Progress Notes (Signed)
   Established Patient Office Visit  Subjective   Patient ID: Katrina Grant, female    DOB: 1971-01-08  Age: 51 y.o. MRN: [id number]  Chief Complaint  Patient presents with   Covid Positive    Patient states the home Covid test was positive 2 days ago and her husband tested positive also   Cough    Productive with white-clear sputum x3 days, tried Mucinex with no relief   Headache    Patient complains of sinus congestion and facial pain x3 days   Generalized Body Aches   Sore Throat    X3 days   Shortness of Breath    X1 day   I connected with  [name] on 02/28/22 by a video enabled telemedicine application and verified that I am speaking with the correct person using two identifiers.   I discussed the limitations of evaluation and management by telemedicine. The patient expressed understanding and agreed to proceed.   Patient location: her home Provider location: [place]   Patient reports positive covid test on Saturday. First day of symptoms was 3 days ago. States that this is the third time that she had COVID.   Cough This is a new problem. Associated symptoms include chills, a fever, headaches, myalgias, nasal congestion, a sore throat and shortness of breath.  Headache  Associated symptoms include coughing, a fever and a sore throat.  Sore Throat  Associated symptoms include congestion, coughing, headaches and shortness of breath.  Shortness of Breath Associated symptoms include a fever, headaches and a sore throat.      Review of Systems  Constitutional:  Positive for chills, fever and malaise/fatigue.  HENT:  Positive for congestion, sinus pain and sore throat.   Respiratory:  Positive for cough and shortness of breath.   Musculoskeletal:  Positive for myalgias.  Neurological:  Positive for headaches.      Objective:     Temp 98.6 F (37 C)   LMP 12/24/2020 (Approximate)    Physical Exam Constitutional:      Appearance: She is  well-developed and normal weight.  Eyes:     Extraocular Movements: Extraocular movements intact.  Pulmonary:     Effort: Pulmonary effort is normal.     Breath sounds: Wheezing (audible wheezing when coughing on the video visit) present.  Neurological:     Mental Status: She is alert and oriented to person, place, and time.  Psychiatric:        Mood and Affect: Mood normal.      No results found for any visits on 02/28/22.    The 10-year ASCVD risk score (Arnett DK, et al., 2019) is: 0.9%    Assessment & Plan:   Problem List Items Addressed This Visit   None Visit Diagnoses     COVID-19    -  Primary   Relevant Medications   nirmatrelvir/ritonavir EUA (PAXLOVID) 20 x 150 MG & 10 x 100MG TABS   promethazine-dextromethorphan (PROMETHAZINE-DM) 6.25-15 MG/5ML syrup      Will treat with Paxlovid course and recommended 7 days of isolation. Work excuse written for patient and rx written for promethazine- DM 5 ml 4 times a day as needed for cough. No follow-ups on file.    Farrel Conners, MD

## 2022-03-01 NOTE — Unmapped (Signed)
[facility] Refill Coordination Note    Specialty Medication(s) to be Shipped:   Inflammatory Disorders: Humira    Other medication(s) to be shipped: No additional medications requested for fill at this time     Jasmine Mitchell, DOB: August 14, 1970  Phone: [phone] (home)       All above HIPAA information was verified with patient.     Was a Nurse, learning disability used for this call? No    Completed refill call assessment today to schedule patient's medication shipment from the [facility] [phone]).  All relevant notes have been reviewed.     Specialty medication(s) and dose(s) confirmed: Regimen is correct and unchanged.   Changes to medications: Brihany reports no changes at this time.  Changes to insurance: No  New side effects reported not previously addressed with a pharmacist or physician: None reported  Questions for the pharmacist: No    Confirmed patient received a Conservation officer, historic buildings and a Surveyor, mining with first shipment. The patient will receive a drug information handout for each medication shipped and additional FDA Medication Guides as required.       DISEASE/MEDICATION-SPECIFIC INFORMATION        For patients on injectable medications: Patient currently has 0 doses left.  Next injection is scheduled for 03/04/2022-giving that her Covid symptoms are improved, if not she will hold.    SPECIALTY MEDICATION ADHERENCE     Medication Adherence    Patient reported X missed doses in the last month: 1  Specialty Medication: Humira-patient skipped 1 dose due to being positive for Covid. Symptoms started on 9/7  Patient is on additional specialty medications: No  Any gaps in refill history greater than 2 weeks in the last 3 months: no  Demonstrates understanding of importance of adherence: yes  Informant: patient  Reliability of informant: reliable              Confirmed plan for next specialty medication refill: delivery by pharmacy  Refills needed for supportive medications: not needed              Were doses missed due to medication being on hold? No    Humira 40/0.4 mg/ml: 0 days of medicine on hand       REFERRAL TO PHARMACIST     Referral to the pharmacist: Not needed      [facility]     Shipping address confirmed in Epic.     Delivery Scheduled: Yes, Expected medication delivery date: 03/03/2022.     Medication will be delivered via UPS to the prescription address in Epic WAM.    [name]   [name] Specialty Technician

## 2022-03-01 NOTE — Unmapped (Signed)
Called and spoke with pt to schedule delivery of her medicine Humira.  States she will touch base with the pharmacy and that she has their numbers.      Reports that she has covid and was not in a rush to get the medicine.

## 2022-03-02 MED FILL — HUMIRA PEN CITRATE FREE 40 MG/0.4 ML: SUBCUTANEOUS | 28 days supply | Qty: 4 | Fill #2

## 2022-03-04 ENCOUNTER — Encounter: Payer: Self-pay | Admitting: Adult Health

## 2022-03-04 ENCOUNTER — Telehealth (INDEPENDENT_AMBULATORY_CARE_PROVIDER_SITE_OTHER): Payer: BC Managed Care – PPO | Admitting: Adult Health

## 2022-03-04 VITALS — Ht 65.0 in | Wt 217.0 lb

## 2022-03-04 DIAGNOSIS — R051 Acute cough: Secondary | ICD-10-CM

## 2022-03-04 MED ORDER — BENZONATATE 200 MG PO CAPS: 200.0000 mg | ORAL_CAPSULE | Freq: Three times a day (TID) | ORAL | 1 refills | Status: DC | PRN

## 2022-03-04 MED ORDER — ALBUTEROL SULFATE HFA 108 (90 BASE) MCG/ACT IN AERS: 2.0000 | INHALATION_SPRAY | Freq: Four times a day (QID) | RESPIRATORY_TRACT | 0 refills | Status: DC | PRN

## 2022-03-04 NOTE — Progress Notes (Signed)
Virtual Visit via Video Note  I connected with Katrina Grant on 03/04/22 at  4:00 PM EDT by a video enabled telemedicine application and verified that I am speaking with the correct person using two identifiers.  Location patient: home Location provider:work or home office Persons participating in the virtual visit: patient, provider  I discussed the limitations of evaluation and management by telemedicine and the availability of in person appointments. The patient expressed understanding and agreed to proceed.   HPI: She was evaluated on 02/28/2022 by her PCP and tested positive for COVID-19.  She was prescribed Paxlovid and was also given some cough syrup.  She reports that she is doing better overall but continues to have a semiproductive cough and chest congestion.  She denies fevers or chills, has not had any wheezing.  She will feel "winded" when going up stairs.  Reports that her symptoms are worse at night.   ROS: See pertinent positives and negatives per HPI.  Past Medical History:  Diagnosis Date   ADD (attention deficit disorder)    Anxiety    Cholesterol serum elevated    on medication in the past   Crohn's disease of colon with fistula [facility])    sees Dr. Coralyn Mark, [facility]   Depression    Fibromyalgia - managed by Dr. Dossie Der, [facility] - takes Neurontin and Tramadol 11/20/2013   Frequent headaches    GERD (gastroesophageal reflux disease)    Osteoarthritis    knee, spine   Pneumonia    Syncope     Past Surgical History:  Procedure Laterality Date   anal fistula repair      X 4    colonoscopy with dilation     X 2    FRACTURE SURGERY     REPAIR EXTENSOR TENDON Left 12/31/2020   Procedure: ABDUCTOR POLLICIS LONGUS RECONSTRUCTION WITH ALLOGRAFT STABILIZATION; EXCISION SPUR ULNA;  Surgeon: Daryll Brod, MD;  Location: [place];  Service: Orthopedics;  Laterality: Left;  AXILLARY BLOCK   TENDON REPAIR Left 12/31/2020   Procedure: REPAIR EXTENSOR CARPI   ULNARIS WITH TENDON GRAFT;;  Surgeon: Daryll Brod, MD;  Location: [place];  Service: Orthopedics;  Laterality: Left;   TUBAL LIGATION      Family History  Problem Relation Age of Onset   Hypertension Mother    ADD / ADHD Mother    Anxiety disorder Mother    Arthritis Mother    Depression Mother    Varicose Veins Mother    Diabetes Father    Hearing loss Father    Heart disease Father    Cancer Sister        cervical   ADD / ADHD Sister    Anxiety disorder Sister    Arthritis Sister    Depression Sister    Early death Sister    Varicose Veins Sister    Mental illness Brother        drugs and alcohol abuse   ADD / ADHD Brother    Alcohol abuse Brother    Asthma Brother    Depression Brother    Drug abuse Brother    ADD / ADHD Daughter        Current Outpatient Medications:    acetaminophen (TYLENOL) 500 MG tablet, Take 1,000 mg by mouth every 6 (six) hours as needed. For migraines and stomach upset, Disp: , Rfl:    Adalimumab 40 MG/0.8ML PSKT, Inject 40 mg into the skin once a week., Disp: , Rfl:  albuterol (VENTOLIN HFA) 108 (90 Base) MCG/ACT inhaler, Inhale 2 puffs into the lungs every 6 (six) hours as needed for wheezing or shortness of breath., Disp: 8 g, Rfl: 0   amphetamine-dextroamphetamine (ADDERALL XR) 20 MG 24 hr capsule, Take 1 capsule (20 mg total) by mouth daily., Disp: 90 capsule, Rfl: 0   benzonatate (TESSALON) 200 MG capsule, Take 1 capsule (200 mg total) by mouth 3 (three) times daily as needed for cough., Disp: 30 capsule, Rfl: 1   buPROPion (WELLBUTRIN XL) 300 MG 24 hr tablet, TAKE 1 TABLET (300 MG TOTAL) BY MOUTH DAILY AFTER BREAKFAST., Disp: 90 tablet, Rfl: 4   cyclobenzaprine (FLEXERIL) 5 MG tablet, Take 1 tablet (5 mg total) by mouth 3 (three) times daily as needed for muscle spasms., Disp: 30 tablet, Rfl: 1   escitalopram (LEXAPRO) 5 MG tablet, Take 1 tablet (5 mg total) by mouth daily., Disp: 90 tablet, Rfl: 1   fluticasone  (FLONASE) 50 MCG/ACT nasal spray, PLACE 1 SPRAY INTO BOTH NOSTRILS DAILY. PATIENT NEEDS AN APPOINTMENT, Disp: 16 mL, Rfl: 5   ibuprofen (ADVIL) 600 MG tablet, Take 1 tablet (600 mg total) by mouth every 8 (eight) hours as needed., Disp: 30 tablet, Rfl: 0   nirmatrelvir/ritonavir EUA (PAXLOVID) 20 x 150 MG & 10 x 100MG TABS, Take 3 tablets by mouth 2 (two) times daily for 5 days. (Take nirmatrelvir 150 mg two tablets twice daily for 5 days and ritonavir 100 mg one tablet twice daily for 5 days) Patient GFR is >60., Disp: 30 tablet, Rfl: 0   ondansetron (ZOFRAN ODT) 8 MG disintegrating tablet, Take 1 tablet (8 mg total) by mouth every 8 (eight) hours as needed for nausea or vomiting., Disp: 30 tablet, Rfl: 0   Prenatal Vit-Fe Fumarate-FA (M-NATAL PLUS) 27-1 MG TABS, TAKE 1 TABLET BY MOUTH EVERY DAY BEFORE BREAKFAST, Disp: 90 tablet, Rfl: 3   promethazine-dextromethorphan (PROMETHAZINE-DM) 6.25-15 MG/5ML syrup, Take 5 mLs by mouth 4 (four) times daily as needed for cough., Disp: 180 mL, Rfl: 0   traMADol (ULTRAM) 50 MG tablet, Take 1 tablet (50 mg total) by mouth 3 (three) times daily as needed., Disp: 60 tablet, Rfl: 1   desipramine (NORPRAMIN) 25 MG tablet, Take 100 mg by mouth at bedtime., Disp: , Rfl:   EXAM:  VITALS per patient if applicable:  GENERAL: alert, oriented, appears well and in no acute distress  HEENT: atraumatic, conjunttiva clear, no obvious abnormalities on inspection of external nose and ears  NECK: normal movements of the head and neck  LUNGS: on inspection no signs of respiratory distress, breathing rate appears normal, no obvious gross SOB, gasping or wheezing  CV: no obvious cyanosis  MS: moves all visible extremities without noticeable abnormality  PSYCH/NEURO: pleasant and cooperative, no obvious depression or anxiety, speech and thought processing grossly intact  ASSESSMENT AND PLAN:  Discussed the following assessment and plan:  Acute cough - Plan: benzonatate  (TESSALON) 200 MG capsule, albuterol (VENTOLIN HFA) 108 (90 Base) MCG/ACT inhaler     I discussed the assessment and treatment plan with the patient. The patient was provided an opportunity to ask questions and all were answered. The patient agreed with the plan and demonstrated an understanding of the instructions.   The patient was advised to call back or seek an in-person evaluation if the symptoms worsen or if the condition fails to improve as anticipated.   Dorothyann Peng, NP

## 2022-03-18 ENCOUNTER — Ambulatory Visit (INDEPENDENT_AMBULATORY_CARE_PROVIDER_SITE_OTHER): Payer: BC Managed Care – PPO

## 2022-03-18 ENCOUNTER — Encounter: Payer: Self-pay | Admitting: Family Medicine

## 2022-03-18 ENCOUNTER — Ambulatory Visit: Payer: BC Managed Care – PPO | Admitting: Family Medicine

## 2022-03-18 VITALS — BP 116/80 | HR 95 | Temp 97.9°F | Ht 66.0 in | Wt 220.0 lb

## 2022-03-18 DIAGNOSIS — S6992XA Unspecified injury of left wrist, hand and finger(s), initial encounter: Secondary | ICD-10-CM | POA: Diagnosis not present

## 2022-03-18 DIAGNOSIS — S62635A Displaced fracture of distal phalanx of left ring finger, initial encounter for closed fracture: Secondary | ICD-10-CM | POA: Diagnosis not present

## 2022-03-18 NOTE — Patient Instructions (Signed)
Please go downstairs for the X ray of your finger.   I recommend buddy taping your 4th and 5th fingers.   If symptoms are not improving in the next week or two then you may want to follow up with Dr. Levell July office.

## 2022-03-18 NOTE — Progress Notes (Signed)
Subjective:     Patient ID: Katrina Grant, female    DOB: 22-Dec-1970, 51 y.o.   MRN: [id number]  Chief Complaint  Patient presents with   [name] on her pinky and bruised and still swollen.    HPI Patient is in today for left pinky finger injury. States her finger is swollen, bruised and painful. She cannot do her usual activities due to not being able to bend it.  States she fell on her steps in her house one week ago  Denies LOC, dizziness, chest pain, palpitations or shortness of breath causing fall.   She is not requiring pain medication. No splint.   Health Maintenance Due  Topic Date Due   COVID-19 Vaccine (4 - Pfizer risk series) 06/20/2020   MAMMOGRAM  03/11/2022    Past Medical History:  Diagnosis Date   ADD (attention deficit disorder)    Anxiety    Cholesterol serum elevated    on medication in the past   Crohn's disease of colon with fistula ([place])    sees Dr. Coralyn Mark, [facility]   Depression    Fibromyalgia - managed by Dr. Dossie Der, [facility] - takes Neurontin and Tramadol 11/20/2013   Frequent headaches    GERD (gastroesophageal reflux disease)    Osteoarthritis    knee, spine   Pneumonia    Syncope     Past Surgical History:  Procedure Laterality Date   anal fistula repair      X 4    colonoscopy with dilation     X 2    FRACTURE SURGERY     REPAIR EXTENSOR TENDON Left 12/31/2020   Procedure: ABDUCTOR POLLICIS LONGUS RECONSTRUCTION WITH ALLOGRAFT STABILIZATION; EXCISION SPUR ULNA;  Surgeon: Daryll Brod, MD;  Location: [place];  Service: Orthopedics;  Laterality: Left;  AXILLARY BLOCK   TENDON REPAIR Left 12/31/2020   Procedure: REPAIR EXTENSOR CARPI  ULNARIS WITH TENDON GRAFT;;  Surgeon: Daryll Brod, MD;  Location: [place];  Service: Orthopedics;  Laterality: Left;   TUBAL LIGATION      Family History  Problem Relation Age of Onset   Hypertension Mother    ADD / ADHD Mother    Anxiety disorder Mother     Arthritis Mother    Depression Mother    Varicose Veins Mother    Diabetes Father    Hearing loss Father    Heart disease Father    Cancer Sister        cervical   ADD / ADHD Sister    Anxiety disorder Sister    Arthritis Sister    Depression Sister    Early death Sister    Varicose Veins Sister    Mental illness Brother        drugs and alcohol abuse   ADD / ADHD Brother    Alcohol abuse Brother    Asthma Brother    Depression Brother    Drug abuse Brother    ADD / ADHD Daughter     Social History   Socioeconomic History   Marital status: Married    Spouse name: Not on file   Number of children: Not on file   Years of education: Not on file   Highest education level: Some college, no degree  Occupational History   Not on file  Tobacco Use   Smoking status: Never   Smokeless tobacco: Never  Substance and Sexual Activity   Alcohol use: Not Currently  Alcohol/week: 0.0 standard drinks of alcohol   Drug use: No   Sexual activity: Yes    Birth control/protection: Surgical  Other Topics Concern   Not on file  Social History Narrative   Work or School: on disability for disability related to Crohn's and managed by Dr. Coralyn Mark; works part time      Home Situation: lives with boyfriend and son (68 yo) in 2015      Spiritual Beliefs: Christian      Lifestyle: regular exercise - walking 3x per week; diet is healthy            Social Determinants of Health   Financial Resource Strain: Low Risk  (11/22/2021)   Overall Financial Resource Strain (CARDIA)    Difficulty of Paying Living Expenses: Not very hard  Food Insecurity: No Food Insecurity (11/22/2021)   Hunger Vital Sign    Worried About Running Out of Food in the Last Year: Never true    [place] in the Last Year: Never true  Transportation Needs: No Transportation Needs (11/22/2021)   PRAPARE - Hydrologist (Medical): No    Lack of Transportation (Non-Medical): No   Physical Activity: Unknown (11/22/2021)   Exercise Vital Sign    Days of Exercise per Week: 0 days    Minutes of Exercise per Session: Not on file  Stress: No Stress Concern Present (11/22/2021)   [place]    Feeling of Stress : Not at all  Social Connections: Unknown (11/22/2021)   Social Connection and Isolation Panel [NHANES]    Frequency of Communication with Friends and Family: Three times a week    Frequency of Social Gatherings with Friends and Family: Once a week    Attends Religious Services: Patient refused    Marine scientist or Organizations: Patient refused    Attends Music therapist: Not on file    Marital Status: Married  Human resources officer Violence: Not on file    Outpatient Medications Prior to Visit  Medication Sig Dispense Refill   acetaminophen (TYLENOL) 500 MG tablet Take 1,000 mg by mouth every 6 (six) hours as needed. For migraines and stomach upset     Adalimumab 40 MG/0.8ML PSKT Inject 40 mg into the skin once a week.     albuterol (VENTOLIN HFA) 108 (90 Base) MCG/ACT inhaler Inhale 2 puffs into the lungs every 6 (six) hours as needed for wheezing or shortness of breath. 8 g 0   amphetamine-dextroamphetamine (ADDERALL XR) 20 MG 24 hr capsule Take 1 capsule (20 mg total) by mouth daily. 90 capsule 0   benzonatate (TESSALON) 200 MG capsule Take 1 capsule (200 mg total) by mouth 3 (three) times daily as needed for cough. 30 capsule 1   buPROPion (WELLBUTRIN XL) 300 MG 24 hr tablet TAKE 1 TABLET (300 MG TOTAL) BY MOUTH DAILY AFTER BREAKFAST. 90 tablet 4   cyclobenzaprine (FLEXERIL) 5 MG tablet Take 1 tablet (5 mg total) by mouth 3 (three) times daily as needed for muscle spasms. 30 tablet 1   escitalopram (LEXAPRO) 5 MG tablet Take 1 tablet (5 mg total) by mouth daily. 90 tablet 1   fluticasone (FLONASE) 50 MCG/ACT nasal spray PLACE 1 SPRAY INTO BOTH NOSTRILS DAILY. PATIENT NEEDS AN  APPOINTMENT 16 mL 5   ibuprofen (ADVIL) 600 MG tablet Take 1 tablet (600 mg total) by mouth every 8 (eight) hours as needed. 30 tablet 0  ondansetron (ZOFRAN ODT) 8 MG disintegrating tablet Take 1 tablet (8 mg total) by mouth every 8 (eight) hours as needed for nausea or vomiting. 30 tablet 0   Prenatal Vit-Fe Fumarate-FA (M-NATAL PLUS) 27-1 MG TABS TAKE 1 TABLET BY MOUTH EVERY DAY BEFORE BREAKFAST 90 tablet 3   promethazine-dextromethorphan (PROMETHAZINE-DM) 6.25-15 MG/5ML syrup Take 5 mLs by mouth 4 (four) times daily as needed for cough. 180 mL 0   traMADol (ULTRAM) 50 MG tablet Take 1 tablet (50 mg total) by mouth 3 (three) times daily as needed. 60 tablet 1   desipramine (NORPRAMIN) 25 MG tablet Take 100 mg by mouth at bedtime.     No facility-administered medications prior to visit.    Allergies  Allergen Reactions   Percocet [Oxycodone-Acetaminophen] Nausea And Vomiting    ROS     Objective:    Physical Exam  BP 116/80 (BP Location: Left Arm, Patient Position: Sitting, Cuff Size: Normal)   Pulse 95   Temp 97.9 F (36.6 C) (Oral)   Ht 5' 6"  (1.676 m)   Wt 220 lb (99.8 kg)   LMP 12/24/2020 (Approximate)   SpO2 99%   BMI 35.51 kg/m  Wt Readings from Last 3 Encounters:  03/18/22 220 lb (99.8 kg)  03/04/22 217 lb (98.4 kg)  02/11/22 217 lb 4.8 oz (98.6 kg)   Left wrist and hand exam shows normal sensation, color, pulses, and decreased ROM with 5th digit, bruising, swelling and TTP of DIP and PIP joints of 5th finger. No sign of tendon rupture. Hand exam normal otherwise.     Assessment & Plan:   Problem List Items Addressed This Visit   None Visit Diagnoses     Finger injury, left, initial encounter    -  Primary   Relevant Orders   DG Finger Little Left (Completed)      Mechanical fall.  XR of left little finger. Recommend ice, splint, buddy tape and giving it more time to heal. She is a patient of Dr. Fredna Dow and will follow up if not improving or if X rays  indicate a need.   I am having Katrina Grant maintain her acetaminophen, Adalimumab, ondansetron, fluticasone, desipramine, traMADol, buPROPion, M-Natal Plus, cyclobenzaprine, escitalopram, amphetamine-dextroamphetamine, ibuprofen, promethazine-dextromethorphan, benzonatate, and albuterol.  No orders of the defined types were placed in this encounter.

## 2022-04-01 ENCOUNTER — Other Ambulatory Visit: Payer: Self-pay | Admitting: Adult Health

## 2022-04-01 DIAGNOSIS — R051 Acute cough: Secondary | ICD-10-CM

## 2022-04-04 NOTE — Unmapped (Signed)
The [facility] has made a third and final attempt to reach this patient to refill the following medication: Humira.      We have left voicemails on the following phone numbers: [phone], have sent a text message to the following phone numbers: [phone] and have sent a Mychart questionnaire..    Dates contacted: 03/23/22, 03/30/22, 04/04/22  Last scheduled delivery: 03/02/22 (28 day supply)    The patient may be at risk of non-compliance with this medication. The patient should call the [facility] at [phone]  Option 4, then Option 2 (all other specialty patients) to refill medication.    Willette Pa   [facility] Specialty Technician

## 2022-04-04 NOTE — Unmapped (Signed)
[facility] Refill Coordination Note    Specialty Medication(s) to be Shipped:   Inflammatory Disorders: Humira    Other medication(s) to be shipped: No additional medications requested for fill at this time     Jasmine [place], DOB: 1971/05/12  Phone: [phone] (home)       All above HIPAA information was verified with patient.     Was a Nurse, learning disability used for this call? No    Completed refill call assessment today to schedule patient's medication shipment from the [facility] [phone]).  All relevant notes have been reviewed.     Specialty medication(s) and dose(s) confirmed: Regimen is correct and unchanged.   Changes to medications: Jasmine [place] reports no changes at this time.  Changes to insurance: No  New side effects reported not previously addressed with a pharmacist or physician: None reported  Questions for the pharmacist: No    Confirmed patient received a Conservation officer, historic buildings and a Surveyor, mining with first shipment. The patient will receive a drug information handout for each medication shipped and additional FDA Medication Guides as required.       DISEASE/MEDICATION-SPECIFIC INFORMATION        For patients on injectable medications: Patient currently has 0 doses left.  Next injection is scheduled for 10/20.    SPECIALTY MEDICATION ADHERENCE     Medication Adherence    Patient reported X missed doses in the last month: 0  Specialty Medication: humira  Patient is on additional specialty medications: No  Patient is on more than two specialty medications: No  Any gaps in refill history greater than 2 weeks in the last 3 months: no  Demonstrates understanding of importance of adherence: yes  Informant: patient  Reliability of informant: reliable  Provider-estimated medication adherence level: good  Patient is at risk for Non-Adherence: No  Reasons for non-adherence: no problems identified                  Confirmed plan for next specialty medication refill: delivery by pharmacy  Refills needed for supportive medications: not needed          Refill Coordination    Has the Patients' Contact Information Changed: No  Is the Shipping Address Different: No         Were doses missed due to medication being on hold? No    humira 40/0.4 mg/ml: 0 days of medicine on hand       REFERRAL TO PHARMACIST     Referral to the pharmacist: Not needed      [facility]     Shipping address confirmed in Epic.     Delivery Scheduled: Yes, Expected medication delivery date: 10/19.     Medication will be delivered via UPS to the prescription address in Epic WAM.    Jasmine [place]   [facility] Specialty Technician

## 2022-04-06 MED FILL — HUMIRA PEN CITRATE FREE 40 MG/0.4 ML: SUBCUTANEOUS | 28 days supply | Qty: 4 | Fill #3

## 2022-04-22 NOTE — Unmapped (Signed)
Colonoscopy  Procedure #1   0  Procedure #2   [id number]  MRN   0  Endoscopist   FALSE  Is the patient's health insurance [address], [facility]), or [employer]?   FALSE  Urgent procedure   FALSE  Do you take: Plavix (clopidogrel), Coumadin (warfarin), Lovenox (enoxaparin), Pradaxa (dabigatran), Effient (prasugrel), Xarelto (rivaroxaban), Eliquis (apixaban), Pletal (cilostazol), or Brilinta (ticagrelor)?   FALSE  Do you have hemophilia, von Willebrand disease, thrombocytopenia?   FALSE  Do you have a pacemaker or implanted cardiac defibrillator?   FALSE  Are you pregnant?   FALSE  Has a [place] [facility] provider specified the location(s)?     Which location(s) did the [facility] provider specify?   FALSE     [facility]   FALSE     [place]   FALSE     [facility]   FALSE     [facility]-Mod Sedation   FALSE  Is procedure indication for variceal banding (this does NOT include variceal screening)?        5  Height (feet)   5  Height (inches)   217  Weight (pounds)   36.1  BMI        FALSE  Did the ordering provider specify a bowel prep?   0       What bowel prep was specified?   FALSE  Do you have chronic kidney disease?   FALSE  Do you have chronic constipation or have you had poor quality bowel preps for past colonoscopies?   TRUE  Do you have Crohn's disease or ulcerative colitis?   FALSE  Have you had weight loss surgery?        FALSE  Are you in the process of scheduling or awaiting results of a heart ultrasound, stress test, or catheterization to evaluate new or worsening chest pain, dizziness, or shortness of breath?   FALSE  When you walk around your house or grocery store, do you have to stop and rest due to shortness of breath, chest pain, or light-headedness?   FALSE  Have you had a heart attack, stroke or heart stent placement within the past 6 months?   FALSE  Do you ever use supplemental oxygen?   FALSE  Have you been hospitalized for cirrhosis of the liver or heart failure in the last 12 months?   FALSE  Have you been treated for mouth or throat cancer with radiation or surgery?   FALSE  Have you been told that it is difficult for doctors to insert a breathing tube in you during anesthesia?   FALSE  Have you had a heart or lung transplant?        FALSE  Are you on dialysis?   FALSE  Do you have cirrhosis of the liver?   FALSE  Do you have myasthenia gravis?   FALSE  Is the patient a prisoner?        FALSE  Have you been diagnosed with sleep apnea or do you wear a CPAP machine at night?   FALSE  Are you younger than 30?   FALSE  Have you previously received propofol sedation administered by an anesthesiologist for a GI procedure?   FALSE  Do you drink an average of more than 3 drinks of alcohol per day?   FALSE  Do you regularly take suboxone or any prescription medications for chronic pain?   FALSE  Do you regularly take Ativan, Klonopin, Xanax, Valium, lorazepam, clonazepam, alprazolam,  or diazepam?   FALSE  Have you previously had difficulty with sedation during a GI procedure?   FALSE  Have you been diagnosed with PTSD?   FALSE  Are you allergic to fentanyl or midazolam (Versed)?   FALSE  Do you take medications for HIV?   ################### ###################################################################################################################   MRN:          [id number]   Anticoag Review:  No   Nurse Triage:  No   GI Clinic Consult:  No   Procedure(s):  Colonoscopy     0   Location[facility]s):  [facility]     [facility]      [place]       [facility]-Mod Sed   Endoscopist:  0   Urgent:            No   Prep:               Miralax Prep        ################### ###################################################################################################################

## 2022-04-26 DIAGNOSIS — K5080 Crohn's disease of both small and large intestine without complications: Principal | ICD-10-CM

## 2022-05-03 ENCOUNTER — Encounter: Payer: Self-pay | Admitting: Family Medicine

## 2022-05-03 ENCOUNTER — Ambulatory Visit: Payer: BC Managed Care – PPO | Admitting: Family Medicine

## 2022-05-03 VITALS — BP 100/58 | HR 90 | Temp 98.0°F | Ht 66.0 in | Wt 221.4 lb

## 2022-05-03 DIAGNOSIS — Z23 Encounter for immunization: Secondary | ICD-10-CM

## 2022-05-03 DIAGNOSIS — F988 Other specified behavioral and emotional disorders with onset usually occurring in childhood and adolescence: Secondary | ICD-10-CM

## 2022-05-03 DIAGNOSIS — N390 Urinary tract infection, site not specified: Secondary | ICD-10-CM | POA: Diagnosis not present

## 2022-05-03 LAB — POCT URINALYSIS DIPSTICK
Bilirubin, UA: NEGATIVE
Glucose, UA: NEGATIVE
Ketones, UA: NEGATIVE
Leukocytes, UA: NEGATIVE
Nitrite, UA: POSITIVE
Protein, UA: NEGATIVE
Spec Grav, UA: 1.01 (ref 1.010–1.025)
Urobilinogen, UA: 0.2 E.U./dL
pH, UA: 6.5 (ref 5.0–8.0)

## 2022-05-03 MED ORDER — AMPHETAMINE-DEXTROAMPHET ER 20 MG PO CP24: 20.0000 mg | ORAL_CAPSULE | Freq: Every day | ORAL | 0 refills | Status: DC

## 2022-05-03 MED ORDER — AMPHETAMINE-DEXTROAMPHETAMINE 10 MG PO TABS: ORAL_TABLET | ORAL | 0 refills | Status: DC

## 2022-05-03 MED ORDER — CIPROFLOXACIN HCL 500 MG PO TABS: 500.0000 mg | ORAL_TABLET | Freq: Two times a day (BID) | ORAL | 0 refills | Status: AC

## 2022-05-03 NOTE — Assessment & Plan Note (Addendum)
Reviewed the note from Urology from 2020, reviewed the last abx she was given. UA is positive for nitrites and blood, will call in cipro 500 mg BID for 5 days. This is the second documented UTI in 6 months, I recommended to patient that if she has another one then we can consider starting a once daily low dose abx for prophylaxis.

## 2022-05-03 NOTE — Progress Notes (Signed)
Established Patient Office Visit  Subjective   Patient ID: Katrina Grant, female    DOB: 04-21-1971  Age: 51 y.o. MRN: [id number]  Chief Complaint  Patient presents with   Follow-up    Patient is here for follow up and medication refills.   ADHD/ anxiety-- patient was started on 5 mg lexapro about 3 months ago, thinks that it is helping some with the anxiety portion. States that she is having trouble around 2-3 pm in the afternoon -- she starts to lose focus and she gets very tired. Feels like maybe the medication is wearing off some in the afternoon.  We reviewed her HM -- she already has her colonoscopy scheduled in January 2024, states she has the order for the mammogram, hasn't scheduled it yet.   Recurrent UTI -- pt is reporting increasing dysuria for the last 3-4 days, denies fever/chills, states her symptoms are similar to the UTI's she has had in the past including increasing frequency   Current Outpatient Medications  Medication Instructions   acetaminophen (TYLENOL) 1,000 mg, Oral, Every 6 hours PRN, For migraines and stomach upset    Adalimumab 40 mg, Weekly   albuterol (VENTOLIN HFA) 108 (90 Base) MCG/ACT inhaler 2 puffs, Inhalation, Every 6 hours PRN   amphetamine-dextroamphetamine (ADDERALL XR) 20 MG 24 hr capsule 20 mg, Oral, Daily   amphetamine-dextroamphetamine (ADDERALL) 10 MG tablet Take 1 tablet daily after lunch.   buPROPion (WELLBUTRIN XL) 300 mg, Oral, Daily after breakfast   ciprofloxacin (CIPRO) 500 mg, Oral, 2 times daily   cyclobenzaprine (FLEXERIL) 5 mg, Oral, 3 times daily PRN   desipramine (NORPRAMIN) 100 mg, Oral, Daily at bedtime   escitalopram (LEXAPRO) 5 mg, Oral, Daily   ibuprofen (ADVIL) 600 mg, Oral, Every 8 hours PRN   ondansetron (ZOFRAN ODT) 8 mg, Oral, Every 8 hours PRN   Prenatal Vit-Fe Fumarate-FA (M-NATAL PLUS) 27-1 MG TABS TAKE 1 TABLET BY MOUTH EVERY DAY BEFORE BREAKFAST   promethazine-dextromethorphan (PROMETHAZINE-DM) 6.25-15  MG/5ML syrup 5 mLs, Oral, 4 times daily PRN   traMADol (ULTRAM) 50 mg, Oral, 3 times daily PRN    Patient Active Problem List   Diagnosis Date Noted   Anxiety disorder 02/01/2022   Recurrent UTI 02/01/2022   ADD (attention deficit disorder) 10/08/2021   BMI 38.0-38.9,adult 11/24/2016   Crohn's disease with complication ([place]) 34/19/3790   Fibromyalgia - managed by Dr. Dossie Der, [facility] - takes Neurontin and Tramadol 11/20/2013   DUB (dysfunctional uterine bleeding) 03/12/2011      Review of Systems  All other systems reviewed and are negative.     Objective:     BP (!) 100/58 (BP Location: Left Arm, Patient Position: Sitting, Cuff Size: Large)   Pulse 90   Temp 98 F (36.7 C) (Oral)   Ht 5' 6"  (1.676 m)   Wt 221 lb 6.4 oz (100.4 kg)   LMP 12/24/2020 (Approximate)   SpO2 99%   BMI 35.73 kg/m    Physical Exam Vitals reviewed.  Constitutional:      Appearance: Normal appearance. She is well-groomed and normal weight.  Eyes:     Conjunctiva/sclera: Conjunctivae normal.  Cardiovascular:     Rate and Rhythm: Normal rate and regular rhythm.     Heart sounds: S1 normal and S2 normal.  Pulmonary:     Effort: Pulmonary effort is normal.     Breath sounds: Normal air entry.  Neurological:     Mental Status: She is alert and oriented to person, place,  and time. Mental status is at baseline.     Gait: Gait is intact.  Psychiatric:        Mood and Affect: Mood and affect normal.        Speech: Speech normal.        Behavior: Behavior normal.        Judgment: Judgment normal.      Results for orders placed or performed in visit on 05/03/22  POC Urinalysis Dipstick  Result Value Ref Range   Color, UA yellow    Clarity, UA cloudy    Glucose, UA Negative Negative   Bilirubin, UA negative    Ketones, UA negative    Spec Grav, UA 1.010 1.010 - 1.025   Blood, UA 2+    pH, UA 6.5 5.0 - 8.0   Protein, UA Negative Negative   Urobilinogen, UA 0.2 0.2 or 1.0 E.U./dL    Nitrite, UA positive    Leukocytes, UA Negative Negative   Appearance     Odor        The 10-year ASCVD risk score (Arnett DK, et al., 2019) is: 0.7%    Assessment & Plan:   Problem List Items Addressed This Visit       Genitourinary   Recurrent UTI (Chronic)    Reviewed the note from Urology from 2020, reviewed the last abx she was given. UA is positive for nitrites and blood, will call in cipro 500 mg BID for 5 days. This is the second documented UTI in 6 months, I recommended to patient that if she has another one then we can consider starting a once daily low dose abx for prophylaxis.      Relevant Medications   ciprofloxacin (CIPRO) 500 MG tablet   Other Relevant Orders   POC Urinalysis Dipstick (Completed)     Other   ADD (attention deficit disorder) - Primary    Doing well on the current dose up until about 2-3 pm, will add a 10 mg daily as needed in the afternoon. RTC every 3 months for medication refills. Continue the adderall 20 mg capsules once daily.      Relevant Medications   amphetamine-dextroamphetamine (ADDERALL XR) 20 MG 24 hr capsule   amphetamine-dextroamphetamine (ADDERALL) 10 MG tablet   Other Visit Diagnoses     Immunization due       Relevant Orders   Flu Vaccine QUAD 6+ mos PF IM (Fluarix Quad PF) (Completed)   Zoster Recombinant (Shingrix ) (Completed)       Return in about 3 months (around 08/03/2022) for video visit for medication refills.    Farrel Conners, MD

## 2022-05-03 NOTE — Assessment & Plan Note (Signed)
Doing well on the current dose up until about 2-3 pm, will add a 10 mg daily as needed in the afternoon. RTC every 3 months for medication refills. Continue the adderall 20 mg capsules once daily.

## 2022-05-03 NOTE — Unmapped (Signed)
[facility] Refill Coordination Note    Specialty Medication(s) to be Shipped:   Inflammatory Disorders: Humira    Other medication(s) to be shipped: No additional medications requested for fill at this time     Jasmine Mitchell, DOB: 1971/06/17  Phone: [phone] (home)       All above HIPAA information was verified with patient.     Was a Nurse, learning disability used for this call? No    Completed refill call assessment today to schedule patient's medication shipment from the [facility] [phone]).  All relevant notes have been reviewed.     Specialty medication(s) and dose(s) confirmed: Regimen is correct and unchanged.   Changes to medications: Jasmine Mitchell reports no changes at this time.  Changes to insurance: No  New side effects reported not previously addressed with a pharmacist or physician: None reported  Questions for the pharmacist: No    Confirmed patient received a Conservation officer, historic buildings and a Surveyor, mining with first shipment. The patient will receive a drug information handout for each medication shipped and additional FDA Medication Guides as required.       DISEASE/MEDICATION-SPECIFIC INFORMATION        For patients on injectable medications: Patient currently has 0 doses left.  Next injection is scheduled for 11/17.    SPECIALTY MEDICATION ADHERENCE     Medication Adherence    Patient reported X missed doses in the last month: 0  Specialty Medication: HUMIRA(CF) PEN 40 mg/0.4 mL injection  Patient is on additional specialty medications: No                          Were doses missed due to medication being on hold? No    Humira 40/0.4 mg/ml: 0 days of medicine on hand        REFERRAL TO PHARMACIST     Referral to the pharmacist: Not needed      [facility]     Shipping address confirmed in Epic.     Delivery Scheduled: Yes, Expected medication delivery date: 05/05/22.     Medication will be delivered via UPS to the prescription address in Epic WAM.    Willette Pa [facility] Specialty Technician

## 2022-05-04 MED FILL — HUMIRA PEN CITRATE FREE 40 MG/0.4 ML: SUBCUTANEOUS | 28 days supply | Qty: 4 | Fill #4

## 2022-05-14 NOTE — Unmapped (Signed)
[facility]   FOLLOWUP NOTE - INFLAMMATORY BOWEL DISEASE  05/17/2022    Demographics:  Jasmine Mitchell is a 51 y.o. year old female    Diagnosis:  Crohn's Disease  Disease onset (yr): 61  Age at onset:   < 36 yr old (A1)  Location:  Ileocolonic (L3) (mostly colonic)  Behavior:  Perianal (P), Stricturing (B2)  Current Tight Control Scenario:   Maintenance = Biologic          HPI / NOTE :     Interval Events:   1.  Last seen by me June 2023.  At that time, continued Humira weekly, increased Desipramine from 100mg  to 150mg  and adding Levsin SL PRN.  Also discussed colonoscopy for staging.    2.  Labs from June 2023 - mild ESR elevation to 67.    Has not had colonoscopy, now scheduled for January.     HPI:  Symptoms more typically at night, and particularly if she gets overly full or has dietary indiscretions. She knows sugar is a big factor and she's been having more lately. She is taking desipramine 125mg , had not gone up to 150mg . With this higher dose she has less cramping. Has only used Levsin about 2 times since last visit. She is scheduled for a colonoscopy Jan 8.    Continues on Wellbutrin 150 daily. Also taking adderall now.    Low back and neck joint pain - present in the morning and improves throughout the course of the day. Also w/ R knee pain.    Abdominal pain (0-10): none  BM a day: ~2x/day  Consistency: formed  % of stools have blood: 0%  Nocturnal BM: none  Urgency:  mild - only w/ IBS flare  Weight change over last 6 mo: stable  Smoking:  no  NSAIDS: avoids    Review of Systems:   Review of systems positive for: negative except as above.   Otherwise, the balance of 10 systems is negative.          IBD HISTORY:     Brief IBD Disease Course:    - 63 - dx with Crohn's dz ~age 16, sx of diarrhea, abd pain, rectal pain and bloody stools.  Treated with sulfasalazine, Asacol, Pentasa without benefit.  Got courses of steroids and antibiotics on and off.  Tried on but had nausea.   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    - 2004 - Remicade x 9 months. Stopped in 2005 due to 2ndary loss of response.   - 2005 - developed a pyoderma lesion.   - 2010 - Re-established care with Dr. Marcella Dubs.  Concern for internal fistulizing disease.  Was on Cimzia. Had surgery for perianal disease.   - 2011 - Started on Humira.   - 2012 - active perianal disease with a large abscess.  Required EUA, drainage, setons ([place]).   - 2013 - April, dx with pelvic floor dyssynergia and anal sphincter weakness, underwent biofeedback x 2 courses ([place] and [place]).   - 2014 - on - doing well on Humira and long term Cipro.  Having on and off bowel symptoms symptoms which have been thought to be related to intermittent IBS symptoms.   - 2023 - Remains on Humira, uptitrating desipramine and levsin PRN for IBS symptoms.     Endoscopy:      - Colonoscopy 06/17/2003: patch inflammation and ulcers throughout the colon.    - Colonoscopy 01/2009:  Active crohn's at IC  valve. Pseudopolyps.  Retraction of IC valve. ?Rectosigmoid fistula tract.  Otherwise normal colon.   - Colonoscopy 10/08/2009:  Perianal fistula, pseudopolyps.  Few aphthae in TI.   - Colonoscopy 07/29/2010:  Poor colon prep, pseudopolyps, stricture at IC valve.  Few ulcers at IC valve. Rectal stricture. Ileal crohn's disease.   - Colonoscopy 05/05/2011:  Pseduopolyps throughout.  4mm cecal polyp. Large fistula in sigmoid colon. Normal TI.   - Colonoscopy 09/06/2012:  Healed scars from prior fistulotomies on perianal exam. Scar in ascending colon. No active Crohn's.   - Colonoscopy 10/24/13:  Pseudopolyps in rectosigmoid, otherwise normal colon and TI.   - Colonoscopy 10/01/14:  Pseudopolyps in rectum, transverse and ascending colon.  Otherwise normal colon.  Granular TI.  Fixed and open IC valve (not stenotic).   - Colonoscopy 08/12/16:  Mild anal canal stenosis on exam, otherwise normal colon and ileum.     PATH = chronic quiescent colitis (left and right colon) no dysplasia  - Colonoscopy 01/28/2019 - mild anal stricture (dilated to 20mm via [place]), otherwise normal colon and ileum with no active inflammation    Imaging:    - CT A/P 10/24/2013: mild colonic wall thickening at splenic/descending colon, no other bowel inflammation or stranding.  *Patchy groundglass opacity in left lung base.     Prior IBD medications (type, dose, duration, response):  x 5-ASAs - Sulfasalazine, Asacol, Pentasa  x Oral corticosteroids - Prednisone  []  Intravenous corticosteroids  x Antibiotics - Flagyl - nausea.  Cipro - tolerates well and it helps.   x Thiopurines - - nausea.    TPMT - normal 07/25/17  x Methotrexate - 2005.   x Anti-TNF therapies - Remicade 2004 - 2005 (~6 doses).  Had gradual loss of response so stopped. Tried on Remicade again in 2009 with some effect, increased to 10mg /kg q4 weeks. Cimzia - started 09/2008.  Humira 2014 - dose increased to 80mg  q2 weeks.  04/2016 - humira level 5.6, Ab negative.   []  Cyclosporine  []  Clinical trial medication  []  Other (Please specify):    Extraintestinal manifestations:   -joint pains affecting: n  -eye: n  -skin: n  -oral ulcers :  n  -blood clots: n  -PSC: n  -other: n          Past Medical History:   Past medical history:   Past Medical History:   Diagnosis Date    Acid reflux     Arthritis     Crohn disease (CMS-HCC)     Fibromyalgia     UTI (urinary tract infection)      Past surgical history:   Past Surgical History:   Procedure Laterality Date    FRACTURE SURGERY      left wrist    GASTROCUTANEOUS FISTULA CLOSURE      ORTHOPEDIC SURGERY      PR COLONOSCOPY FLX DX W/COLLJ SPEC WHEN PFRMD N/A 10/24/2013    Procedure: COLONOSCOPY, FLEXIBLE, PROXIMAL TO SPLENIC FLEXURE; DIAGNOSTIC, W/WO COLLECTION SPECIMEN BY BRUSH OR WASH;  Surgeon: Theadore Nan, MD;  [facility];  Service: Gastroenterology    PR COLONOSCOPY W/BIOPSY SINGLE/MULTIPLE N/A 10/01/2014    Procedure: COLONOSCOPY, FLEXIBLE, PROXIMAL TO SPLENIC FLEXURE; WITH BIOPSY, SINGLE OR MULTIPLE;  Surgeon: Teodoro Spray, MD;  Location: [facility];  Service: Gastroenterology    PR COLONOSCOPY W/BIOPSY SINGLE/MULTIPLE N/A 08/12/2016    Procedure: COLONOSCOPY, FLEXIBLE, PROXIMAL TO SPLENIC FLEXURE; WITH BIOPSY, SINGLE OR MULTIPLE;  Surgeon: Ranetta Armacost  Raphael Gibney, MD;  Location: [facility];  Service: Gastroenterology    PR COLONOSCOPY W/BIOPSY SINGLE/MULTIPLE Left 01/28/2019    Procedure: COLONOSCOPY, FLEXIBLE, PROXIMAL TO SPLENIC FLEXURE; WITH BIOPSY, SINGLE OR MULTIPLE;  Surgeon: Zetta Bills, MD;  Location: [facility];  Service: Gastroenterology    PR COLSC FLX W/RMVL OF TUMOR POLYP LESION SNARE TQ Left 01/28/2019    Procedure: COLONOSCOPY FLEX; W/REMOV TUMOR/LES BY SNARE;  Surgeon: Zetta Bills, MD;  Location: [facility];  Service: Gastroenterology    PR SURG DIAGNOSTIC EXAM, ANORECTAL N/A 12/23/2016    Procedure: ANORECTAL EXAM, SURGICAL, REQUIRING ANESTHESIA (GENERAL, SPINAL, OR EPIDURAL), DIAGNOSTIC;  Surgeon: Mickle Asper, MD;  Location: MAIN OR [place];  Service: Gastrointestinal    PR UPPER GI ENDOSCOPY,DIAGNOSIS N/A 08/12/2016    Procedure: UGI ENDO, INCLUDE ESOPHAGUS, STOMACH, & DUODENUM &/OR JEJUNUM; DX W/WO COLLECTION SPECIMN, BY BRUSH OR WASH;  Surgeon: Zetta Bills, MD;  [facility];  Service: Gastroenterology    TUBAL LIGATION       Family history:   Family History   Problem Relation Age of Onset    Cancer Sister     Cancer Paternal Grandmother     Colorectal Cancer Neg Hx      Social history:   Social History     Socioeconomic History    Marital status: Married     Spouse name: None    Number of children: None    Years of education: None    Highest education level: None   Tobacco Use    Smoking status: Never    Smokeless tobacco: Never   Substance and Sexual Activity    Alcohol use: Yes     Alcohol/week: 0.0 standard drinks of alcohol     Comment: socially Drug use: No             Allergies:     Allergies   Allergen Reactions    Oxycodone (Bulk) Nausea And Vomiting     Patient tolerates acetaminophen well             Medications:     Current Outpatient Medications   Medication Sig Dispense Refill    acetaminophen (TYLENOL) 500 MG tablet Take 2 tablets (1,000 mg total) by mouth every four (4) hours as needed for pain.      buPROPion (WELLBUTRIN XL) 150 MG 24 hr tablet Take 1 tablet (150 mg total) by mouth daily.      desipramine (NORPRAMIN) 25 MG tablet Take 4 tablets (100 mg total) by mouth nightly. 360 tablet 0    desipramine (NORPRAMIN) 25 MG tablet TAKE 6 TABLETS (150 MG TOTAL) BY MOUTH DAILY. 540 tablet 2    dextroamphetamine-amphetamine (ADDERALL XR) 20 MG 24 hr capsule Take 1 capsule (20 mg total) by mouth daily.      dextroamphetamine-amphetamine (ADDERALL) 10 mg tablet TAKE 1 TABLET DAILY AFTER LUNCH.      escitalopram oxalate (LEXAPRO) 5 MG tablet Take 1 tablet (5 mg total) by mouth daily. TAKE 1 TABLET (5 MG TOTAL) BY MOUTH DAILY.      HUMIRA PEN CITRATE FREE 40 MG/0.4 ML Inject the contents of 1 pen (40 mg total) under the skin once a week. 4 each 5    MULTIVIT WITH CALCIUM,IRON,MIN (WOMEN'S DAILY MULTIVITAMIN ORAL) Take 1 tablet by mouth once daily.      ondansetron (ZOFRAN-ODT) 8 MG disintegrating tablet Take 1 tablet (8 mg total) by mouth every eight (8) hours as  needed.      fluticasone (FLONASE) 50 mcg/actuation nasal spray 1 SPRAY BY EACH NARE ROUTE DAILY. AS DIRECTED (Patient taking differently: 1 spray by Each Nare route daily as needed. As directed) 16 mL 2    predniSONE (DELTASONE) 10 mg tablet pack Take by mouth daily. 6 day dose pack for hurt arm. (Patient not taking: Reported on 05/17/2022)       No current facility-administered medications for this visit.             Physical Exam:   Pulse 89  - Ht 165.1 cm (5' 5)  - Wt 99.3 kg (219 lb)  - LMP 12/02/2016 (Approximate)  - BMI 36.44 kg/m??     GEN: no apparent distress, appears comfortable on exam  HEENT: OP clear with no erythema, lesions, exudate, mucous membranes moist  NECK: Supple, no lymphadenopathy  LUNGS: CTAB, no wheezes, rales, or rhonchi  CV: S1/S2, RRR, no murmurs  ABD: Soft, nontender, no rebound/guarding, nondistended, normoactive bowel sounds, no appreciable organomegaly  Extremities: no cyanosis, clubbing or edema, normal gait  Psych: affect appropriate, A&O x3  SKIN: no visible lesions on face, neck, arms, abdomen          Labs, Data & Indices:     Lab Review:   Lab Results   Component Value Date    WBC 4.9 05/17/2022    WBC 6.5 04/29/2016    RBC 4.31 05/17/2022    RBC 4.50 04/29/2016    HGB 13.3 05/17/2022    HGB 13.2 04/29/2016     Lab Results   Component Value Date    AST 20 05/17/2022    AST 13 04/29/2016    ALT 18 05/17/2022    ALT 9 04/29/2016    BUN 11 12/09/2021    BUN 11 04/29/2016    Creatinine 0.79 05/17/2022    Creatinine 0.80 04/29/2016    CO2 27.8 12/09/2021    CO2 21 04/29/2016    Albumin 4.1 12/09/2021    Albumin 4.3 08/19/2014    Calcium 9.9 12/09/2021    Calcium 9.4 04/29/2016     Lab Results   Component Value Date    TSH 2.810 04/29/2016      ...........................................................................................................................................Marland Kitchen   Diagnosis ICD-10-CM Associated Orders   1. Crohn's disease of both small and large intestine without complication (CMS-HCC)  K50.80 CBC     AST     ALT     Creatinine     CBC     AST     ALT     Creatinine      2. Irritable bowel syndrome with diarrhea  K58.0       3. Immunosuppression due to drug therapy (CMS-HCC)  D84.821 CBC    Z79.899 AST     ALT     Creatinine     CBC     AST     ALT     Creatinine                Assessment & Recommendations:   Disease state:  Ileocolonic Crohn's with perianal disease and anorectal stricture    Adam Demary is a 51 y.o. female with a long standing hx of stricturing ileocolonic Crohn's disease.  She also has hx of perianal disease (last requiring drainage by Dr. Elenore Rota 2018).  She is currently maintained on weekly Humira monotherapy in clinical and endoscopic remission on this therapy. She also has concurrent Irritable bowel syndrome with diarrhea and pain, which  has responded reasonably well to desipramine, particularly on higher dose (she is also on Wellbutrin).  She has good response to Levsin prn for cramping, discussed trying this for times of diarrhea as well. She notes diet can be a large contributor to symptoms (especially sugar cravings), reviewed behavioral interventions for this and offered nutritionist and/or medical weight loss clinic referrals. She'd like to see what kind of changes she can make on her own before pursuing these routes. For her Crohn's disease, we will update routine labs today and is scheduled for colonoscopy in January to restage her disease activity.     PLAN:  1.  Continue Humira weekly  2.  Update labs today including CBC, Cr, AST, ALT  3.  Continue Desipramine to 125mg  (5 tablets) daily  4.  Levsin SL prn for cramping, recommend also trying to episodes of unanticipated diarrhea/urgency  5.  Colonoscopy scheduled for Jan 2024  6.  Will notify us if she desires nutritionist or medical weight loss referrals  7.  Clinic follow up in 6 months.     IBD health maintenance:  Influenza vaccine: 2012, 117/17, 04/2022  Pneumonia vaccine: Prevnar 02/2014; Pneumovax 3//08/2018  COVID Vaccine:  Pfizer April 2021 x 2 doses, 3rd shot 04/2020  Hepatitis B:   TB testing:   Chickenpox/Shingles history: Shingrix #18 November 2021, #2 04/2022  Bone denistometry:   Derm appointment:  Last small bowel imaging:   Last colonoscopy: 01/2019  PAP smear:   --------------------------------------------  Author: Zetta Bills, MD 05/17/2022 11:54 AM    I saw and evaluated Jasmine Mitchell with GI fellow Dr. Jimmie Molly.  I participated in key portions of the service and personally reviewed the plan of care with the patient.  I reviewed the fellow's note and agree with the fellow???s findings and plan.     Additional thoughts are below:  Doing well on Humira weekly + Desipramine 125mg .  Also encouraged more frequent use of Levsin for IBS flareups (diarrhea).  Discussed  weight management, sugar intake and sugar cravings.  Offered referral to nutritionist or Endocrinology medical weight loss clinic - she would like to hold off on these for now.     Zetta Bills, MD  Associate Professor of Medicine  Division of Gastroenterology & Hepatology  [facility]  =========================================

## 2022-05-17 ENCOUNTER — Ambulatory Visit: Admit: 2022-05-17 | Discharge: 2022-05-18 | Payer: PRIVATE HEALTH INSURANCE

## 2022-05-17 DIAGNOSIS — D84821 Immunosuppression due to drug therapy (CMS-HCC): Principal | ICD-10-CM

## 2022-05-17 DIAGNOSIS — K580 Irritable bowel syndrome with diarrhea: Secondary | ICD-10-CM | POA: Diagnosis not present

## 2022-05-17 DIAGNOSIS — Z79899 Other long term (current) drug therapy: Secondary | ICD-10-CM | POA: Diagnosis not present

## 2022-05-17 DIAGNOSIS — K5080 Crohn's disease of both small and large intestine without complications: Secondary | ICD-10-CM | POA: Diagnosis not present

## 2022-05-17 LAB — ALT: ALT (SGPT): 18 U/L (ref 10–49)

## 2022-05-17 LAB — CBC
HEMATOCRIT: 39.4 % (ref 34.0–44.0)
HEMOGLOBIN: 13.3 g/dL (ref 11.3–14.9)
MEAN CORPUSCULAR HEMOGLOBIN CONC: 33.8 g/dL (ref 32.0–36.0)
MEAN CORPUSCULAR HEMOGLOBIN: 30.8 pg (ref 25.9–32.4)
MEAN CORPUSCULAR VOLUME: 91.3 fL (ref 77.6–95.7)
MEAN PLATELET VOLUME: 7.6 fL (ref 6.8–10.7)
PLATELET COUNT: 241 10*9/L (ref 150–450)
RED BLOOD CELL COUNT: 4.31 10*12/L (ref 3.95–5.13)
RED CELL DISTRIBUTION WIDTH: 14.4 % (ref 12.2–15.2)
WBC ADJUSTED: 4.9 10*9/L (ref 3.6–11.2)

## 2022-05-17 LAB — AST: AST (SGOT): 20 U/L (ref <=34)

## 2022-05-17 LAB — CREATININE
CREATININE: 0.79 mg/dL
EGFR CKD-EPI (2021) FEMALE: >90 mL/min/{1.73_m2} (ref >=60)

## 2022-05-17 NOTE — Unmapped (Addendum)
Check some labs today for monitoring  Continue using desipramine 125mg  daily  Consider using the Levsin when you're having a flare of looser stools to see if that might help slow things down  If you would like nutritionist or medical weight loss clinic visits, let us know  See you for your colonoscopy in January!  Clinic visit in 6 months

## 2022-05-26 MED ORDER — SIMETHICONE 125 MG CHEWABLE TABLET
ORAL_TABLET | ORAL | 0 refills | 0 days | Status: CP
Start: 2022-05-26 — End: ?
  Filled 2022-06-23: qty 4, 2d supply, fill #0

## 2022-05-26 MED ORDER — POLYETHYLENE GLYCOL 3350 17 GRAM/DOSE ORAL POWDER
0 refills | 0 days | Status: CP
Start: 2022-05-26 — End: ?
  Filled 2022-06-23: qty 2, 1d supply, fill #0

## 2022-05-26 MED ORDER — BISACODYL 5 MG TABLET,DELAYED RELEASE
ORAL_TABLET | Freq: Once | ORAL | 0 refills | 1 days | Status: CP
Start: 2022-05-26 — End: 2022-05-26

## 2022-05-31 NOTE — Unmapped (Signed)
The [facility] has made a second and final attempt to reach this patient to refill the following medication: Humira.      We have left voicemails on the following phone numbers: [phone], have sent a text message to the following phone numbers: [phone] and have sent a Mychart questionnaire..    Dates contacted: 05/26/22 & 05/31/22  Last scheduled delivery: 05/03/22 (28 day supply)    The patient may be at risk of non-compliance with this medication. The patient should call the [facility] at [phone]  Option 4, then Option 2 (all other specialty patients) to refill medication.    Willette Pa   [facility] Specialty Technician

## 2022-06-02 ENCOUNTER — Other Ambulatory Visit: Payer: Self-pay | Admitting: Obstetrics

## 2022-06-02 DIAGNOSIS — F418 Other specified anxiety disorders: Secondary | ICD-10-CM

## 2022-06-02 NOTE — Unmapped (Signed)
[facility] Refill Coordination Note    Specialty Medication(s) to be Shipped:   Inflammatory Disorders: Humira    Other medication(s) to be shipped: No additional medications requested for fill at this time     Jasmine Mitchell, DOB: 08-05-1970  Phone: [phone] (home)       All above HIPAA information was verified with patient.     Was a Nurse, learning disability used for this call? No    Completed refill call assessment today to schedule patient's medication shipment from the [facility] [phone]).  All relevant notes have been reviewed.     Specialty medication(s) and dose(s) confirmed: Regimen is correct and unchanged.   Changes to medications: Jasmine Mitchell reports no changes at this time.  Changes to insurance: No  New side effects reported not previously addressed with a pharmacist or physician: None reported  Questions for the pharmacist: No    Confirmed patient received a Conservation officer, historic buildings and a Surveyor, mining with first shipment. The patient will receive a drug information handout for each medication shipped and additional FDA Medication Guides as required.       DISEASE/MEDICATION-SPECIFIC INFORMATION        For patients on injectable medications: Patient currently has 0 doses left.  Next injection is scheduled for 06/07/22.    SPECIALTY MEDICATION ADHERENCE     Medication Adherence    Patient reported X missed doses in the last month: 0  Specialty Medication: HUMIRA(CF) PEN 40 mg/0.4 mL  Patient is on additional specialty medications: No                                Were doses missed due to medication being on hold? No        REFERRAL TO PHARMACIST     Referral to the pharmacist: Not needed      [facility]     Shipping address confirmed in Epic.     Delivery Scheduled: Yes, Expected medication delivery date: 06/07/22.     Medication will be delivered via UPS to the prescription address in Epic WAM.    Quintella Reichert   [facility] Specialty Technician

## 2022-06-06 MED FILL — HUMIRA PEN CITRATE FREE 40 MG/0.4 ML: SUBCUTANEOUS | 28 days supply | Qty: 4 | Fill #5

## 2022-06-21 NOTE — Unmapped (Signed)
Spoke with patient regarding upcoming GI procedure on 06/27/22 @ 0900 at [facility].    Confirmed patient has prep instructions.    Review medications, vitamins, and supplements to hold prior to procedure.    Confirmed time of arrival and procedure site.    Reminded patient that they would need a driver to bring them and pick them up after the procedure.    Patient verbalized understanding of instructions.     Patient does not have questions at this time.

## 2022-06-23 MED FILL — POLYETHYLENE GLYCOL 3350 17 GRAM/DOSE ORAL POWDER: 1 days supply | Qty: 119 | Fill #0

## 2022-06-23 MED FILL — POLYETHYLENE GLYCOL 3350 17 GRAM/DOSE ORAL POWDER: 1 days supply | Qty: 238 | Fill #0

## 2022-06-27 ENCOUNTER — Encounter
Admit: 2022-06-27 | Discharge: 2022-06-27 | Payer: PRIVATE HEALTH INSURANCE | Attending: Anesthesiology | Primary: Anesthesiology

## 2022-06-27 ENCOUNTER — Ambulatory Visit: Admit: 2022-06-27 | Discharge: 2022-06-27 | Payer: PRIVATE HEALTH INSURANCE

## 2022-06-27 DIAGNOSIS — K219 Gastro-esophageal reflux disease without esophagitis: Secondary | ICD-10-CM | POA: Diagnosis not present

## 2022-06-27 DIAGNOSIS — K6389 Other specified diseases of intestine: Secondary | ICD-10-CM | POA: Diagnosis not present

## 2022-06-27 DIAGNOSIS — K624 Stenosis of anus and rectum: Secondary | ICD-10-CM | POA: Diagnosis not present

## 2022-06-27 DIAGNOSIS — K6289 Other specified diseases of anus and rectum: Secondary | ICD-10-CM | POA: Diagnosis not present

## 2022-06-27 DIAGNOSIS — K5080 Crohn's disease of both small and large intestine without complications: Secondary | ICD-10-CM | POA: Diagnosis not present

## 2022-06-27 DIAGNOSIS — D125 Benign neoplasm of sigmoid colon: Secondary | ICD-10-CM | POA: Diagnosis not present

## 2022-06-27 DIAGNOSIS — J309 Allergic rhinitis, unspecified: Secondary | ICD-10-CM | POA: Diagnosis not present

## 2022-06-27 DIAGNOSIS — K635 Polyp of colon: Secondary | ICD-10-CM | POA: Diagnosis not present

## 2022-06-27 DIAGNOSIS — Z79899 Other long term (current) drug therapy: Secondary | ICD-10-CM | POA: Diagnosis not present

## 2022-06-27 DIAGNOSIS — K648 Other hemorrhoids: Secondary | ICD-10-CM | POA: Diagnosis not present

## 2022-06-27 DIAGNOSIS — K50812 Crohn's disease of both small and large intestine with intestinal obstruction: Secondary | ICD-10-CM | POA: Diagnosis not present

## 2022-06-27 DIAGNOSIS — K5730 Diverticulosis of large intestine without perforation or abscess without bleeding: Secondary | ICD-10-CM | POA: Diagnosis not present

## 2022-06-27 DIAGNOSIS — Z6836 Body mass index (BMI) 36.0-36.9, adult: Secondary | ICD-10-CM | POA: Diagnosis not present

## 2022-06-27 DIAGNOSIS — M1990 Unspecified osteoarthritis, unspecified site: Secondary | ICD-10-CM | POA: Diagnosis not present

## 2022-06-27 DIAGNOSIS — K5010 Crohn's disease of large intestine without complications: Secondary | ICD-10-CM | POA: Diagnosis not present

## 2022-06-27 DIAGNOSIS — Z1211 Encounter for screening for malignant neoplasm of colon: Secondary | ICD-10-CM | POA: Diagnosis not present

## 2022-06-27 DIAGNOSIS — E669 Obesity, unspecified: Secondary | ICD-10-CM | POA: Diagnosis not present

## 2022-06-27 DIAGNOSIS — M797 Fibromyalgia: Secondary | ICD-10-CM | POA: Diagnosis not present

## 2022-06-27 MED ORDER — ADALIMUMAB PEN CITRATE FREE 40 MG/0.4 ML
SUBCUTANEOUS | 5 refills | 28 days
Start: 2022-06-27 — End: ?

## 2022-06-27 MED ORDER — DICYCLOMINE 10 MG CAPSULE
ORAL_CAPSULE | Freq: Four times a day (QID) | ORAL | 11 refills | 30 days | Status: CP
Start: 2022-06-27 — End: 2023-06-27

## 2022-06-27 MED ADMIN — lidocaine (XYLOCAINE) 20 mg/mL (2 %) injection: INTRAVENOUS | @ 15:00:00 | Stop: 2022-06-27

## 2022-06-27 MED ADMIN — lactated Ringers infusion: 10 mL/h | INTRAVENOUS | @ 14:00:00 | Stop: 2022-06-27

## 2022-06-27 MED ADMIN — Propofol (DIPRIVAN) injection: INTRAVENOUS | @ 15:00:00 | Stop: 2022-06-27

## 2022-06-27 NOTE — Unmapped (Signed)
I spoke with Jasmine Mitchell after her colonoscopy today - no active Crohn's disease which is good news.  However, Jasmine Mitchell has been having epigastric and RUQ burning for 1-2 months now. This happens predominantly after meals  (though no specific food triggers). Also has some mild heartburn. Not taking any acid blocking medications.     She is also reporting more issues with IBS and desipramine 150mg  isn't helping. Notes urgency particularly in the evening after dinner, sometimes loose stools.     PLAN:   1. Continue humira  2. Add OTC Omeprazole 20mg  daily for dyspepsia symptoms (daily for 6-8 weeks)  3. Use Bentyl instead of Levsin.  Recommend 10mg  bentyl qAM and 70m in the afternoon.  She has had constipation with bentyl in the past so I discussed counteracting that with water intake and stool softener as needed.

## 2022-06-28 MED ORDER — ADALIMUMAB PEN CITRATE FREE 40 MG/0.4 ML
SUBCUTANEOUS | 5 refills | 28 days | Status: CP
Start: 2022-06-28 — End: ?
  Filled 2022-07-13: qty 4, 28d supply, fill #0

## 2022-06-28 NOTE — Unmapped (Signed)
Refill request for Humira sent to the pharmacy for 6 month supply. Pt seen for colonoscopy 06/27/22.

## 2022-07-06 NOTE — Unmapped (Signed)
The [facility] has made a second and final attempt to reach this patient to refill the following medication: Humira.      We have left voicemails on the following phone numbers: [phone], have sent a text message to the following phone numbers: [phone] and have sent a Mychart questionnaire..    Dates contacted: 06/27/22 & 07/06/22  Last scheduled delivery: 06/06/22 (28 day supply)    The patient may be at risk of non-compliance with this medication. The patient should call the [facility] at [phone]  Option 4, then Option 2 (all other specialty patients) to refill medication.    Willette Pa   [facility] Specialty Technician

## 2022-07-12 NOTE — Unmapped (Signed)
[facility] Refill Coordination Note    Specialty Medication(s) to be Shipped:   Inflammatory Disorders: Humira    Other medication(s) to be shipped: No additional medications requested for fill at this time     Jasmine Mitchell, DOB: 04/07/1971  Phone: [phone] (home)       All above HIPAA information was verified with patient.     Was a Nurse, learning disability used for this call? No    Completed refill call assessment today to schedule patient's medication shipment from the [facility] [phone]).  All relevant notes have been reviewed.     Specialty medication(s) and dose(s) confirmed: Regimen is correct and unchanged.   Changes to medications: Jasmine Mitchell reports no changes at this time.  Changes to insurance: No  New side effects reported not previously addressed with a pharmacist or physician: None reported  Questions for the pharmacist: No    Confirmed patient received a Conservation officer, historic buildings and a Surveyor, mining with first shipment. The patient will receive a drug information handout for each medication shipped and additional FDA Medication Guides as required.       DISEASE/MEDICATION-SPECIFIC INFORMATION        For patients on injectable medications: Patient currently has 0 doses left.  Next injection is scheduled for asap (overdue, missed 1/19 dose, will resume 1/26) .    SPECIALTY MEDICATION ADHERENCE     Medication Adherence    Patient reported X missed doses in the last month: 1  Specialty Medication: Humira                            Were doses missed due to medication being on hold? No    Humira - 0 left    REFERRAL TO PHARMACIST     Referral to the pharmacist: Not needed      [facility]     Shipping address confirmed in Epic.     Delivery Scheduled: Yes, Expected medication delivery date: 1/25.     Medication will be delivered via UPS to the prescription address in Epic WAM.    Jasmine Mitchell A Desiree Lucy [facility] Specialty Pharmacist

## 2022-07-18 ENCOUNTER — Encounter: Payer: Self-pay | Admitting: Family Medicine

## 2022-07-18 ENCOUNTER — Ambulatory Visit: Payer: BC Managed Care – PPO | Admitting: Family Medicine

## 2022-07-18 DIAGNOSIS — F988 Other specified behavioral and emotional disorders with onset usually occurring in childhood and adolescence: Secondary | ICD-10-CM | POA: Diagnosis not present

## 2022-07-18 DIAGNOSIS — F419 Anxiety disorder, unspecified: Secondary | ICD-10-CM

## 2022-07-18 MED ORDER — AMPHETAMINE-DEXTROAMPHETAMINE 10 MG PO TABS: ORAL_TABLET | ORAL | 0 refills | Status: DC

## 2022-07-18 MED ORDER — AMPHETAMINE-DEXTROAMPHET ER 20 MG PO CP24: 20.0000 mg | ORAL_CAPSULE | Freq: Every day | ORAL | 0 refills | Status: DC

## 2022-07-18 MED ORDER — AMPHETAMINE-DEXTROAMPHETAMINE 10 MG PO TABS: 10.0000 mg | ORAL_TABLET | Freq: Every day | ORAL | 0 refills | Status: DC

## 2022-07-18 MED ORDER — ESCITALOPRAM OXALATE 5 MG PO TABS: 5.0000 mg | ORAL_TABLET | Freq: Every day | ORAL | 1 refills | Status: DC

## 2022-07-18 MED ORDER — AMPHETAMINE-DEXTROAMPHET ER 20 MG PO CP24: 20.0000 mg | ORAL_CAPSULE | ORAL | 0 refills | Status: DC

## 2022-07-18 NOTE — Assessment & Plan Note (Signed)
On 5 mg daily of lexapro, doing well with her anxiety, will continue this as prescribed.

## 2022-07-18 NOTE — Progress Notes (Signed)
Established Patient Office Visit  Subjective   Patient ID: Katrina Grant, female    DOB: 28-Dec-1970  Age: 52 y.o. MRN: [id number]  Chief Complaint  Patient presents with   Medical Management of Chronic Issues    Pt is here for ADHD medication refills. She reports she is doing well on the current dose, no side effects to the medication. She reports that she doesn't take her medication on the weekends, only when she is working. States that the afternoon medication is working well, she takes it after lunch and it helps her get through the rest of the day.    Current Outpatient Medications  Medication Instructions   acetaminophen (TYLENOL) 1,000 mg, Oral, Every 6 hours PRN, For migraines and stomach upset    Adalimumab 40 mg, Weekly   albuterol (VENTOLIN HFA) 108 (90 Base) MCG/ACT inhaler 2 puffs, Inhalation, Every 6 hours PRN   [START ON 09/14/2022] amphetamine-dextroamphetamine (ADDERALL XR) 20 MG 24 hr capsule 20 mg, Oral, Daily   [START ON 08/17/2022] amphetamine-dextroamphetamine (ADDERALL XR) 20 MG 24 hr capsule 20 mg, Oral, BH-each morning   amphetamine-dextroamphetamine (ADDERALL XR) 20 MG 24 hr capsule 20 mg, Oral, BH-each morning   [START ON 09/14/2022] amphetamine-dextroamphetamine (ADDERALL) 10 MG tablet Take 1 tablet daily after lunch.   [START ON 08/17/2022] amphetamine-dextroamphetamine (ADDERALL) 10 MG tablet 10 mg, Oral, Daily after lunch   amphetamine-dextroamphetamine (ADDERALL) 10 MG tablet 10 mg, Oral, Daily after lunch   buPROPion (WELLBUTRIN XL) 300 mg, Oral, Daily after breakfast   cyclobenzaprine (FLEXERIL) 5 mg, Oral, 3 times daily PRN   escitalopram (LEXAPRO) 5 mg, Oral, Daily   ibuprofen (ADVIL) 600 mg, Oral, Every 8 hours PRN   ondansetron (ZOFRAN ODT) 8 mg, Oral, Every 8 hours PRN   Prenatal Vit-Fe Fumarate-FA (M-NATAL PLUS) 27-1 MG TABS TAKE 1 TABLET BY MOUTH EVERY DAY BEFORE BREAKFAST   promethazine-dextromethorphan (PROMETHAZINE-DM) 6.25-15 MG/5ML syrup 5  mLs, Oral, 4 times daily PRN   traMADol (ULTRAM) 50 mg, Oral, 3 times daily PRN    Patient Active Problem List   Diagnosis Date Noted   Anxiety disorder 02/01/2022   Recurrent UTI 02/01/2022   ADD (attention deficit disorder) 10/08/2021   BMI 38.0-38.9,adult 11/24/2016   Crohn's disease with complication ([place]) 59/93/5701   Fibromyalgia - managed by Dr. Dossie Der, [facility] - takes Neurontin and Tramadol 11/20/2013   DUB (dysfunctional uterine bleeding) 03/12/2011      Review of Systems  All other systems reviewed and are negative.     Objective:     BP 100/72 (BP Location: Left Arm, Patient Position: Sitting, Cuff Size: Large)   Pulse 85   Temp 97.7 F (36.5 C) (Oral)   Ht '5\' 6"'$  (1.676 m)   Wt 222 lb 14.4 oz (101.1 kg)   LMP 12/24/2020 (Approximate)   SpO2 98%   BMI 35.98 kg/m    Physical Exam Vitals reviewed.  Constitutional:      Appearance: Normal appearance. She is well-groomed and normal weight.  Eyes:     Conjunctiva/sclera: Conjunctivae normal.  Cardiovascular:     Rate and Rhythm: Normal rate and regular rhythm.     Pulses: Normal pulses.     Heart sounds: S1 normal and S2 normal.  Pulmonary:     Effort: Pulmonary effort is normal.     Breath sounds: Normal breath sounds and air entry.  Neurological:     Mental Status: She is alert and oriented to person, place, and time. Mental status is  at baseline.     Gait: Gait is intact.  Psychiatric:        Mood and Affect: Mood and affect normal.        Speech: Speech normal.        Behavior: Behavior normal.        Judgment: Judgment normal.      No results found for any visits on 07/18/22.    The 10-year ASCVD risk score (Arnett DK, et al., 2019) is: 0.8%    Assessment & Plan:   Problem List Items Addressed This Visit       Unprioritized   ADD (attention deficit disorder)    Doing well on the 20 mg XR adderall daily and the 10 mg adderall in the afternoon. Will continue these medication  as prescribed. RTC in 3 months for medications refills.       Relevant Medications   amphetamine-dextroamphetamine (ADDERALL XR) 20 MG 24 hr capsule (Start on 09/14/2022)   amphetamine-dextroamphetamine (ADDERALL) 10 MG tablet (Start on 09/14/2022)   amphetamine-dextroamphetamine (ADDERALL XR) 20 MG 24 hr capsule (Start on 08/17/2022)   amphetamine-dextroamphetamine (ADDERALL XR) 20 MG 24 hr capsule   amphetamine-dextroamphetamine (ADDERALL) 10 MG tablet (Start on 08/17/2022)   amphetamine-dextroamphetamine (ADDERALL) 10 MG tablet   Anxiety disorder (Chronic)    On 5 mg daily of lexapro, doing well with her anxiety, will continue this as prescribed.       Relevant Medications   escitalopram (LEXAPRO) 5 MG tablet    Return in about 3 months (around 10/17/2022) for video visit for ADHD refills.    Farrel Conners, MD

## 2022-07-18 NOTE — Assessment & Plan Note (Signed)
Doing well on the 20 mg XR adderall daily and the 10 mg adderall in the afternoon. Will continue these medication as prescribed. RTC in 3 months for medications refills.

## 2022-07-20 ENCOUNTER — Other Ambulatory Visit: Payer: Self-pay | Admitting: Family Medicine

## 2022-07-20 DIAGNOSIS — Z1231 Encounter for screening mammogram for malignant neoplasm of breast: Secondary | ICD-10-CM

## 2022-08-08 NOTE — Unmapped (Signed)
[facility] Clinical Assessment & Refill Coordination Note    Jasmine Mitchell, [place]: Jul 02, 1970  Phone: [phone] (home)     All above HIPAA information was verified with patient.     Was a Nurse, learning disability used for this call? No    Specialty Medication(s):   Inflammatory Disorders: Humira     Current Outpatient Medications   Medication Sig Dispense Refill    acetaminophen (TYLENOL) 500 MG tablet Take 2 tablets (1,000 mg total) by mouth every four (4) hours as needed for pain.      ADALIMUMAB PEN CITRATE FREE 40 MG/0.4 ML Inject the contents of 1 pen (40 mg total) under the skin once a week. 4 each 5    buPROPion (WELLBUTRIN XL) 150 MG 24 hr tablet Take 1 tablet (150 mg total) by mouth daily.      desipramine (NORPRAMIN) 25 MG tablet Take 4 tablets (100 mg total) by mouth nightly. 360 tablet 0    desipramine (NORPRAMIN) 25 MG tablet TAKE 6 TABLETS (150 MG TOTAL) BY MOUTH DAILY. 540 tablet 2    dextroamphetamine-amphetamine (ADDERALL XR) 20 MG 24 hr capsule Take 1 capsule (20 mg total) by mouth daily.      dextroamphetamine-amphetamine (ADDERALL) 10 mg tablet TAKE 1 TABLET DAILY AFTER LUNCH.      dicyclomine (BENTYL) 10 mg capsule Take 1 capsule (10 mg total) by mouth Four (4) times a day (before meals and nightly). 120 capsule 11    escitalopram oxalate (LEXAPRO) 5 MG tablet Take 1 tablet (5 mg total) by mouth daily. TAKE 1 TABLET (5 MG TOTAL) BY MOUTH DAILY.      fluticasone (FLONASE) 50 mcg/actuation nasal spray 1 SPRAY BY EACH NARE ROUTE DAILY. AS DIRECTED (Patient taking differently: 1 spray by Each Nare route daily as needed. As directed) 16 mL 2    MULTIVIT WITH CALCIUM,IRON,MIN (WOMEN'S DAILY MULTIVITAMIN ORAL) Take 1 tablet by mouth once daily.      ondansetron (ZOFRAN-ODT) 8 MG disintegrating tablet Take 1 tablet (8 mg total) by mouth every eight (8) hours as needed. (Patient not taking: Reported on 06/27/2022)      polyethylene glycol (CLEARLAX) 17 gram/dose powder Take as directed for split prep. 238 g 0    polyethylene glycol (CLEARLAX) 17 gram/dose powder Take as directed for split prep. 119 g 0    predniSONE (DELTASONE) 10 mg tablet pack Take by mouth daily. 6 day dose pack for hurt arm.      simethicone (MYLICON) 125 MG chewable tablet Take 2 tablets (250mg  total) with prep as directed. 4 tablet 0     No current facility-administered medications for this visit.        Changes to medications: [name] reports no changes at this time.    Allergies   Allergen Reactions    Oxycodone (Bulk) Nausea And Vomiting     Patient tolerates acetaminophen well       Changes to allergies: No    SPECIALTY MEDICATION ADHERENCE     Humira 40  mg/0.91mL : 0 days of medicine on hand   Medication Adherence    Patient reported X missed doses in the last month: 0  Specialty Medication: Humira 40mg /0.9mL - 1q7d  Patient is on additional specialty medications: No  Patient is on more than two specialty medications: No  Any gaps in refill history greater than 2 weeks in the last 3 months: no  Demonstrates understanding of importance of adherence: yes  Informant: patient  Specialty medication(s) dose(s) confirmed: Regimen is correct and unchanged.     Are there any concerns with adherence? No    Adherence counseling provided? Not needed    CLINICAL MANAGEMENT AND INTERVENTION      Clinical Benefit Assessment:    Do you feel the medicine is effective or helping your condition? Yes    Clinical Benefit counseling provided? Not needed    Adverse Effects Assessment:    Are you experiencing any side effects? No    Are you experiencing difficulty administering your medicine? No    Quality of Life Assessment:    Quality of Life    Rheumatology  Oncology  Dermatology  Cystic Fibrosis          How many days over the past month did your crohn's  keep you from your normal activities? For example, brushing your teeth or getting up in the morning. 0    Have you discussed this with your provider? Not needed    Acute Infection Status:    Acute infections noted within Epic:  No active infections  Patient reported infection: None    Therapy Appropriateness:    Is therapy appropriate and patient progressing towards therapeutic goals? Yes, therapy is appropriate and should be continued    DISEASE/MEDICATION-SPECIFIC INFORMATION      For patients on injectable medications: Patient currently has 0 doses left.  Next injection is scheduled for 2/23.    Chronic Inflammatory Diseases: Have you experienced any flares in the last month? No  Has this been reported to your provider? N/a    PATIENT SPECIFIC NEEDS     Does the patient have any physical, cognitive, or cultural barriers? No    Is the patient high risk? No    Did the patient require a clinical intervention? No    Does the patient require physician intervention or other additional services (i.e., nutrition, smoking cessation, social work)? No    SOCIAL DETERMINANTS OF HEALTH     At the [facility], we have learned that life circumstances - like trouble affording food, housing, utilities, or transportation can affect the health of many of our patients.   That is why we wanted to ask: are you currently experiencing any life circumstances that are negatively impacting your health and/or quality of life? No    Social Determinants of Health     Financial Resource Strain: Low Risk  (11/22/2021)    Received from [facility]    Overall Financial Resource Strain (CARDIA)     Difficulty of Paying Living Expenses: Not very hard   Internet Connectivity: Not on file   Food Insecurity: No Food Insecurity (11/22/2021)    Received from [facility]    Hunger Vital Sign     Worried About Running Out of Food in the Last Year: Never true     Ran Out of Food in the Last Year: Never true   Tobacco Use: Low Risk  (06/27/2022)    Patient History     Smoking Tobacco Use: Never     Smokeless Tobacco Use: Never     Passive Exposure: Not on file   Housing/Utilities: Not on file   Alcohol Use: Not At Risk (04/04/2018)    Received from [facility]    AUDIT-C     Frequency of Alcohol Consumption: Never     Average Number of Drinks: Not on file     Frequency of Binge Drinking: Not on file   Transportation  Needs: No Transportation Needs (11/22/2021)    Received from [facility]     Lack of Transportation (Medical): No     Lack of Transportation (Non-Medical): No   Substance Use: Not on file   Health Literacy: Not on file   Physical Activity: Unknown (11/22/2021)    Received from [facility]    Exercise Vital Sign     Days of Exercise per Week: 0 days     Minutes of Exercise per Session: Not on file   Interpersonal Safety: Not on file   Stress: No Stress Concern Present (11/22/2021)    Received from [facility] - Occupational Stress Questionnaire     Feeling of Stress : Not at all   Intimate Partner Violence: Not on file   Depression: Not at risk (02/24/2021)    Received from [facility]    PHQ-2     SDOH PHQ2 SCORE: 0   Social Connections: Unknown (11/22/2021)    Received from [facility]    Social Connection and Isolation Panel [NHANES]     Frequency of Communication with Friends and Family: Three times a week     Frequency of Social Gatherings with Friends and Family: Once a week     Attends Religious Services: Patient refused     Database administrator or Organizations: Patient refused     Attends Engineer, structural: Not on file     Marital Status: Married       Would you be willing to receive help with any of the needs that you have identified today? Not applicable       SHIPPING     Specialty Medication(s) to be Shipped:   Inflammatory Disorders: Humira    Other medication(s) to be shipped: No additional medications requested for fill at this time     Changes to insurance: No    Patient was informed of new phone menu: No    Delivery Scheduled: Yes, Expected medication delivery date: 2/21. Medication will be delivered via UPS to the confirmed prescription address in [facility].    The patient will receive a drug information handout for each medication shipped and additional FDA Medication Guides as required.  Verified that patient has previously received a Conservation officer, historic buildings and a Surveyor, mining.    The patient or caregiver noted above participated in the development of this care plan and knows that they can request review of or adjustments to the care plan at any time.      All of the patient's questions and concerns have been addressed.    Teofilo Pod, PharmD   [facility] Specialty Pharmacist

## 2022-08-09 MED FILL — HUMIRA PEN CITRATE FREE 40 MG/0.4 ML: SUBCUTANEOUS | 28 days supply | Qty: 4 | Fill #1

## 2022-08-23 DIAGNOSIS — Z1231 Encounter for screening mammogram for malignant neoplasm of breast: Secondary | ICD-10-CM

## 2022-09-06 NOTE — Unmapped (Signed)
The [facility] has made a second and final attempt to reach this patient to refill the following medication: Humira.      We have left voicemails on the following phone numbers: [phone], have sent a text message to the following phone numbers: [phone], and have sent a Mychart questionnaire..    Dates contacted: 08/30/22 & 09/06/22  Last scheduled delivery: 08/09/22 (28 day supply)    The patient may be at risk of non-compliance with this medication. The patient should call the [facility] at [phone]  Option 4, then Option 2 (all other specialty patients) to refill medication.    Willette Pa   [facility] Specialty Technician

## 2022-09-07 NOTE — Unmapped (Signed)
[facility] Refill Coordination Note    Specialty Medication(s) to be Shipped:   Inflammatory Disorders: Humira    Other medication(s) to be shipped: No additional medications requested for fill at this time     Jasmine Mitchell, DOB: [birth year]  Phone: [phone] (home)       All above HIPAA information was verified with patient.     Was a Nurse, learning disability used for this call? No    Completed refill call assessment today to schedule patient's medication shipment from the [facility] [phone]).  All relevant notes have been reviewed.     Specialty medication(s) and dose(s) confirmed: Regimen is correct and unchanged.   Changes to medications: Rihanna reports no changes at this time.  Changes to insurance: No  New side effects reported not previously addressed with a pharmacist or physician: None reported  Questions for the pharmacist: No    Confirmed patient received a Conservation officer, historic buildings and a Surveyor, mining with first shipment. The patient will receive a drug information handout for each medication shipped and additional FDA Medication Guides as required.       DISEASE/MEDICATION-SPECIFIC INFORMATION        For patients on injectable medications: Patient currently has 0 doses left.  Next injection is scheduled for 09/09/22.    SPECIALTY MEDICATION ADHERENCE     Medication Adherence    Patient reported X missed doses in the last month: 0  Specialty Medication: Humira 40 mg/0.36ml  Patient is on additional specialty medications: No  Informant: patient  Confirmed plan for next specialty medication refill: delivery by pharmacy              Were doses missed due to medication being on hold? No    Humira 40/0.4 mg/ml: 2 days of medicine on hand       REFERRAL TO PHARMACIST     Referral to the pharmacist: Not needed      [facility]     Shipping address confirmed in Epic.     Patient was notified of new phone menu : Yes    Delivery Scheduled: Yes, Expected medication delivery date: 09/09/22.     Medication will be delivered via UPS to the prescription address in Epic WAM.    Brewer Hitchman Vangie Bicker, PharmD   [facility] Specialty Pharmacist

## 2022-09-08 MED FILL — HUMIRA PEN CITRATE FREE 40 MG/0.4 ML: SUBCUTANEOUS | 28 days supply | Qty: 4 | Fill #2

## 2022-09-28 DIAGNOSIS — S66812A Strain of other specified muscles, fascia and tendons at wrist and hand level, left hand, initial encounter: Secondary | ICD-10-CM | POA: Diagnosis not present

## 2022-09-28 DIAGNOSIS — G5622 Lesion of ulnar nerve, left upper limb: Secondary | ICD-10-CM | POA: Diagnosis not present

## 2022-09-28 DIAGNOSIS — M25532 Pain in left wrist: Secondary | ICD-10-CM | POA: Diagnosis not present

## 2022-10-03 ENCOUNTER — Telehealth (INDEPENDENT_AMBULATORY_CARE_PROVIDER_SITE_OTHER): Payer: BC Managed Care – PPO | Admitting: Family Medicine

## 2022-10-03 ENCOUNTER — Encounter: Payer: Self-pay | Admitting: Family Medicine

## 2022-10-03 DIAGNOSIS — F419 Anxiety disorder, unspecified: Secondary | ICD-10-CM

## 2022-10-03 DIAGNOSIS — F988 Other specified behavioral and emotional disorders with onset usually occurring in childhood and adolescence: Secondary | ICD-10-CM

## 2022-10-03 MED ORDER — AMPHETAMINE-DEXTROAMPHET ER 20 MG PO CP24: 20.0000 mg | ORAL_CAPSULE | Freq: Every day | ORAL | 0 refills | Status: DC

## 2022-10-03 MED ORDER — AMPHETAMINE-DEXTROAMPHETAMINE 10 MG PO TABS: ORAL_TABLET | ORAL | 0 refills | Status: DC

## 2022-10-03 MED ORDER — ESCITALOPRAM OXALATE 10 MG PO TABS: 10.0000 mg | ORAL_TABLET | Freq: Every day | ORAL | 0 refills | Status: DC

## 2022-10-03 NOTE — Progress Notes (Signed)
Virtual Medical Office Visit  Patient:  Katrina Grant      Age: 52 y.o.       Sex:  female  Date:   10/03/2022  PCP:    Karie Georges, MD   Today's Healthcare Provider: Karie Georges, MD    Assessment/Plan:   Summary assessment:  Katrina Grant was seen today for medical management of chronic issues.  Attention deficit disorder, unspecified hyperactivity presence Assessment & Plan: Symptoms are well controlled on the adderall XR 20 mg daily and 10 mg in the afternoon, refills placed.   Orders: -     Amphetamine-Dextroamphet ER; Take 1 capsule (20 mg total) by mouth daily.  Dispense: 90 capsule; Refill: 0 -     Amphetamine-Dextroamphetamine; Take 1 tablet daily after lunch.  Dispense: 90 tablet; Refill: 0  Anxiety disorder, unspecified type Assessment & Plan: Pt had a death in the family and increasing cargiving responsibility of her elderly mother. I finding that her overall anxiety level has increased. Will increase her lexapro to 10 mg daily to help with the additional stress. I will see her back in video visit in 3 months for med refills and follow up.   Orders: -     Escitalopram Oxalate; Take 1 tablet (10 mg total) by mouth daily.  Dispense: 90 tablet; Refill: 0     Return in about 19 weeks (around 02/13/2023) for annual physical exam.   She was advised to call the office or go to ER if her condition worsens    Subjective:   Katrina Grant is a 52 y.o. female with PMH significant for: Past Medical History:  Diagnosis Date   ADD (attention deficit disorder)    Anxiety    Cholesterol serum elevated    on medication in the past   Crohn's disease of colon with fistula    sees Dr. Marcella Dubs, [facility]   Depression    Fibromyalgia - managed by Dr. Kathi Ludwig, [facility] - takes Neurontin and Tramadol 11/20/2013   Frequent headaches    GERD (gastroesophageal reflux disease)    Osteoarthritis    knee, spine   Pneumonia    Syncope      Presenting today with: Chief  Complaint  Patient presents with   Medical Management of Chronic Issues     She clarifies and reports that her condition: Patient states she is doing well on the current dose of medications. She denies any side effects to the medication. Pt is reporting that her brother passed away last month and she is having to help care for her elderly mother. She states that it has added a lot of stress to her currently. Lots of worrying and thinking about things. She is also grieving the loss of her brother. She reports that she is still able to sleep at night, but she is having more difficult time waking up waking up in the morning. Reporting an increase in irritability but no crying spells.   She denies having any: Crying spells, negative thoughts          Objective/Observations  Physical Exam:  Polite and friendly Gen: NAD, resting comfortably Pulm: Normal work of breathing Neuro: Grossly normal, moves all extremities Psych: Normal affect and thought content Problem specific physical exam findings:    No images are attached to the encounter or orders placed in the encounter.    Results: No results found for any visits on 10/03/22.   No results found for this or any previous  visit (from the past 2160 hour(s)).         Virtual Visit via Video   I connected with Katrina Grant on 10/03/22 at 11:30 AM EDT by a video enabled telemedicine application and verified that I am speaking with the correct person using two identifiers. The limitations of evaluation and management by telemedicine and the availability of in person appointments were discussed. The patient expressed understanding and agreed to proceed.   Percentage of appointment time on video:  100% Patient location: Home Provider location:  [facility] Persons participating in the virtual visit: Myself and Patient

## 2022-10-03 NOTE — Assessment & Plan Note (Signed)
Pt had a death in the family and increasing cargiving responsibility of her elderly mother. I finding that her overall anxiety level has increased. Will increase her lexapro to 10 mg daily to help with the additional stress. I will see her back in video visit in 3 months for med refills and follow up.

## 2022-10-03 NOTE — Assessment & Plan Note (Signed)
Symptoms are well controlled on the adderall XR 20 mg daily and 10 mg in the afternoon, refills placed.

## 2022-10-04 NOTE — Unmapped (Signed)
[facility] Refill Coordination Note    Specialty Medication(s) to be Shipped:   Inflammatory Disorders: Humira    Other medication(s) to be shipped: No additional medications requested for fill at this time     Jasmine Mitchell, DOB: 11/26/1970  Phone: [phone] (home)       All above HIPAA information was verified with patient.     Was a Nurse, learning disability used for this call? No    Completed refill call assessment today to schedule patient's medication shipment from the [facility] [phone]).  All relevant notes have been reviewed.     Specialty medication(s) and dose(s) confirmed: Regimen is correct and unchanged.   Changes to medications: [place] reports no changes at this time.  Changes to insurance: No  New side effects reported not previously addressed with a pharmacist or physician: None reported  Questions for the pharmacist: No    Confirmed patient received a Conservation officer, historic buildings and a Surveyor, mining with first shipment. The patient will receive a drug information handout for each medication shipped and additional FDA Medication Guides as required.       DISEASE/MEDICATION-SPECIFIC INFORMATION        For patients on injectable medications: Patient currently has 0 doses left.  Next injection is scheduled for 4/19.    SPECIALTY MEDICATION ADHERENCE     Medication Adherence    Patient reported X missed doses in the last month: 0  Specialty Medication: HUMIRA(CF) PEN 40 mg/0.4 mL  Patient is on additional specialty medications: No              Were doses missed due to medication being on hold? No    Humira 40/0.4 mg/ml: 0 days of medicine on hand        REFERRAL TO PHARMACIST     Referral to the pharmacist: Not needed      [facility]     Shipping address confirmed in Epic.       Delivery Scheduled: Yes, Expected medication delivery date: 10/06/22.     Medication will be delivered via UPS to the prescription address in Epic WAM.    Willette Pa   [facility] Specialty Technician

## 2022-10-05 MED FILL — HUMIRA PEN CITRATE FREE 40 MG/0.4 ML: SUBCUTANEOUS | 28 days supply | Qty: 4 | Fill #3

## 2022-10-07 ENCOUNTER — Inpatient Hospital Stay
Admission: RE | Admit: 2022-10-07 | Discharge: 2022-10-07 | Disposition: A | Discharge status: Home / Self Care | Payer: BC Managed Care – PPO | Source: Ambulatory Visit | Attending: Family Medicine | Admitting: Family Medicine

## 2022-10-07 DIAGNOSIS — Z1231 Encounter for screening mammogram for malignant neoplasm of breast: Secondary | ICD-10-CM | POA: Diagnosis not present

## 2022-10-17 ENCOUNTER — Telehealth: Payer: BC Managed Care – PPO | Admitting: Family Medicine

## 2022-10-26 DIAGNOSIS — M79641 Pain in right hand: Secondary | ICD-10-CM | POA: Diagnosis not present

## 2022-10-26 DIAGNOSIS — M25532 Pain in left wrist: Secondary | ICD-10-CM | POA: Diagnosis not present

## 2022-10-26 DIAGNOSIS — M79642 Pain in left hand: Secondary | ICD-10-CM | POA: Diagnosis not present

## 2022-10-26 NOTE — Unmapped (Signed)
[facility] Refill Coordination Note    Specialty Medication(s) to be Shipped:   Inflammatory Disorders: Humira    Other medication(s) to be shipped: No additional medications requested for fill at this time     Jasmine Mitchell, DOB: Mar 02, 1971  Phone: [phone] (home)       All above HIPAA information was verified with patient.     Was a Nurse, learning disability used for this call? No    Completed refill call assessment today to schedule patient's medication shipment from the [facility] [phone]).  All relevant notes have been reviewed.     Specialty medication(s) and dose(s) confirmed: Regimen is correct and unchanged.   Changes to medications: Katharin reports no changes at this time.  Changes to insurance: No  New side effects reported not previously addressed with a pharmacist or physician: None reported  Questions for the pharmacist: No    Confirmed patient received a Conservation officer, historic buildings and a Surveyor, mining with first shipment. The patient will receive a drug information handout for each medication shipped and additional FDA Medication Guides as required.       DISEASE/MEDICATION-SPECIFIC INFORMATION        For patients on injectable medications: Patient currently has 1 doses left.  Next injection is scheduled for 5/10.    SPECIALTY MEDICATION ADHERENCE     Medication Adherence    Patient reported X missed doses in the last month: 0  Specialty Medication: HUMIRA(CF) PEN 40 mg/0.4 mL  Patient is on additional specialty medications: No              Were doses missed due to medication being on hold? No    Humira 40/0.4 mg/ml: 2 days of medicine on hand        REFERRAL TO PHARMACIST     Referral to the pharmacist: Not needed      [facility]     Shipping address confirmed in Epic.       Delivery Scheduled: Yes, Expected medication delivery date: 11/02/22.     Medication will be delivered via UPS to the prescription address in Epic WAM.    Willette Pa   [facility] Specialty Technician

## 2022-10-27 DIAGNOSIS — G5603 Carpal tunnel syndrome, bilateral upper limbs: Secondary | ICD-10-CM | POA: Diagnosis not present

## 2022-11-01 MED FILL — HUMIRA PEN CITRATE FREE 40 MG/0.4 ML: SUBCUTANEOUS | 28 days supply | Qty: 4 | Fill #4

## 2022-11-02 ENCOUNTER — Other Ambulatory Visit: Payer: Self-pay | Admitting: Obstetrics

## 2022-11-02 DIAGNOSIS — Z0000 Encounter for general adult medical examination without abnormal findings: Secondary | ICD-10-CM

## 2022-11-17 ENCOUNTER — Ambulatory Visit: Payer: BC Managed Care – PPO | Admitting: Family Medicine

## 2022-11-17 ENCOUNTER — Encounter: Payer: Self-pay | Admitting: Family Medicine

## 2022-11-17 VITALS — BP 124/78 | HR 90 | Temp 98.5°F | Wt 226.0 lb

## 2022-11-17 DIAGNOSIS — T3695XA Adverse effect of unspecified systemic antibiotic, initial encounter: Secondary | ICD-10-CM | POA: Diagnosis not present

## 2022-11-17 DIAGNOSIS — N390 Urinary tract infection, site not specified: Secondary | ICD-10-CM

## 2022-11-17 DIAGNOSIS — B379 Candidiasis, unspecified: Secondary | ICD-10-CM | POA: Diagnosis not present

## 2022-11-17 DIAGNOSIS — R3915 Urgency of urination: Secondary | ICD-10-CM

## 2022-11-17 LAB — POC URINALSYSI DIPSTICK (AUTOMATED)
Bilirubin, UA: NEGATIVE
Glucose, UA: NEGATIVE
Ketones, UA: NEGATIVE
Protein, UA: POSITIVE — AB
Spec Grav, UA: >=1.03 — AB (ref 1.010–1.025)
Urobilinogen, UA: NEGATIVE E.U./dL — AB
pH, UA: 6 (ref 5.0–8.0)

## 2022-11-17 MED ORDER — FLUCONAZOLE 150 MG PO TABS
150.0000 mg | ORAL_TABLET | Freq: Once | ORAL | 0 refills | Status: AC
Start: 2022-11-17 — End: 2022-11-17

## 2022-11-17 MED ORDER — AMOXICILLIN 500 MG PO TABS
500.0000 mg | ORAL_TABLET | Freq: Two times a day (BID) | ORAL | 0 refills | Status: AC
Start: 2022-11-17 — End: 2022-11-24

## 2022-11-17 NOTE — Progress Notes (Signed)
Established Patient Office Visit   Subjective  Patient ID: Katrina Grant, female    DOB: 1971/04/04  Age: 52 y.o. MRN: [id number]  Chief Complaint  Patient presents with   Urinary Tract Infection    Urgency, pressure. Prone to get UTIs pcp gave cipro last uti.  Has been tested at urologists office and it is just her anatomy.  Has been going on a couple weeks.     Patient is a 52 year old female with Crohn's disease, GERD, fibromyalgia, anxiety, ADHD, OA, recurrent TIAs who is followed by Dr. Casimiro Needle and seen for ongoing concern.  Patient endorses bladder pressure, cloudy urine, urinary urgency and frequency x 2 weeks.  Noticed light spotting on toilet tissue after wiping and brief right-sided low back pain last week.  Denies pain that moves, nausea, vomiting, fever, chills.  Previously seen by urology 3 years ago for same.  Pt does not like drinking water.  Urinary Tract Infection     Past Medical History:  Diagnosis Date   ADD (attention deficit disorder)    Anxiety    Cholesterol serum elevated    on medication in the past   Crohn's disease of colon with fistula (HCC)    sees Dr. Marcella Dubs, [facility]   Depression    Fibromyalgia - managed by Dr. Kathi Ludwig, [facility] - takes Neurontin and Tramadol 11/20/2013   Frequent headaches    GERD (gastroesophageal reflux disease)    Osteoarthritis    knee, spine   Pneumonia    Syncope    Past Surgical History:  Procedure Laterality Date   anal fistula repair      X 4    colonoscopy with dilation     X 2    FRACTURE SURGERY     REPAIR EXTENSOR TENDON Left 12/31/2020   Procedure: ABDUCTOR POLLICIS LONGUS RECONSTRUCTION WITH ALLOGRAFT STABILIZATION; EXCISION SPUR ULNA;  Surgeon: Cindee Salt, MD;  Location: [facility];  Service: Orthopedics;  Laterality: Left;  AXILLARY BLOCK   TENDON REPAIR Left 12/31/2020   Procedure: REPAIR EXTENSOR CARPI  ULNARIS WITH TENDON GRAFT;;  Surgeon: Cindee Salt, MD;  Location: [facility];  Service: Orthopedics;  Laterality: Left;   TUBAL LIGATION     Family History  Problem Relation Age of Onset   Hypertension Mother    ADD / ADHD Mother    Anxiety disorder Mother    Arthritis Mother    Depression Mother    Varicose Veins Mother    Diabetes Father    Hearing loss Father    Heart disease Father    Cancer Sister        cervical   ADD / ADHD Sister    Anxiety disorder Sister    Arthritis Sister    Depression Sister    Early death Sister    Varicose Veins Sister    Mental illness Brother        drugs and alcohol abuse   ADD / ADHD Brother    Alcohol abuse Brother    Asthma Brother    Depression Brother    Drug abuse Brother    ADD / ADHD Daughter    Allergies  Allergen Reactions   Percocet [Oxycodone-Acetaminophen] Nausea And Vomiting      ROS Negative unless stated above    Objective:     BP 124/78 (BP Location: Right Arm, Patient Position: Sitting, Cuff Size: Large)   Pulse 90   Temp 98.5 F (36.9 C) (Oral)  Wt 226 lb (102.5 kg)   LMP 12/24/2020 (Approximate)   SpO2 99%   BMI 36.48 kg/m    Physical Exam Constitutional:      General: She is not in acute distress.    Appearance: Normal appearance.  HENT:     Head: Normocephalic and atraumatic.     Nose: Nose normal.     Mouth/Throat:     Mouth: Mucous membranes are moist.  Cardiovascular:     Rate and Rhythm: Normal rate and regular rhythm.     Heart sounds: Normal heart sounds. No murmur heard.    No gallop.  Pulmonary:     Effort: Pulmonary effort is normal. No respiratory distress.     Breath sounds: Normal breath sounds. No wheezing, rhonchi or rales.  Abdominal:     Tenderness: There is abdominal tenderness in the suprapubic area. There is no right CVA tenderness or left CVA tenderness.  Skin:    General: Skin is warm and dry.  Neurological:     Mental Status: She is alert and oriented to person, place, and time.      Results for orders placed or  performed in visit on 11/17/22  POCT Urinalysis Dipstick (Automated)  Result Value Ref Range   Color, UA yellowish    Clarity, UA cloudy    Glucose, UA Negative Negative   Bilirubin, UA neg    Ketones, UA neg    Spec Grav, UA >=1.030 (A) 1.010 - 1.025   Blood, UA 3+    pH, UA 6.0 5.0 - 8.0   Protein, UA Positive (A) Negative   Urobilinogen, UA negative (A) 0.2 or 1.0 E.U./dL   Nitrite, UA +    Leukocytes, UA Large (3+) (A) Negative      Assessment & Plan:  Recurrent UTI -     Urine Culture -     Amoxicillin; Take 1 tablet (500 mg total) by mouth 2 (two) times daily for 7 days.  Dispense: 14 tablet; Refill: 0  Urinary urgency -     POCT Urinalysis Dipstick (Automated) -     Urine Culture  Antibiotic-induced yeast infection -     Fluconazole; Take 1 tablet (150 mg total) by mouth once for 1 dose.  Dispense: 1 tablet; Refill: 0  Start ABX for UTI.  Patient advised to increase intake of water.  Obtain UCX.  For continued symptoms follow-up with urology.  Diflucan for antibiotic induced yeast infection.  Return if symptoms worsen or fail to improve.   Deeann Saint, MD

## 2022-11-19 LAB — URINE CULTURE
MICRO NUMBER:: 15020402
SPECIMEN QUALITY:: ADEQUATE

## 2022-11-24 ENCOUNTER — Telehealth: Payer: Self-pay | Admitting: Family Medicine

## 2022-11-24 DIAGNOSIS — N390 Urinary tract infection, site not specified: Secondary | ICD-10-CM

## 2022-11-24 NOTE — Telephone Encounter (Signed)
Pt called in and stated amoxicillin she was prescribed does not seem to be working for her UTI. She states she is still having UTI symptoms. She would like a call from [facility] to discuss different options.

## 2022-11-24 NOTE — Unmapped (Signed)
[facility] Refill Coordination Note    Specialty Medication(s) to be Shipped:   Inflammatory Disorders: Humira    Other medication(s) to be shipped: No additional medications requested for fill at this time     Jasmine Mitchell, DOB: [birth year]  Phone: [phone] (home)       All above HIPAA information was verified with patient.     Was a Nurse, learning disability used for this call? No    Completed refill call assessment today to schedule patient's medication shipment from the [facility] [phone]).  All relevant notes have been reviewed.     Specialty medication(s) and dose(s) confirmed: Regimen is correct and unchanged.   Changes to medications: Johana reports no changes at this time.  Changes to insurance: No  New side effects reported not previously addressed with a pharmacist or physician: None reported  Questions for the pharmacist: No    Confirmed patient received a Conservation officer, historic buildings and a Surveyor, mining with first shipment. The patient will receive a drug information handout for each medication shipped and additional FDA Medication Guides as required.       DISEASE/MEDICATION-SPECIFIC INFORMATION        For patients on injectable medications: Patient currently has 1 doses left.  Next injection is scheduled for 6/7.    SPECIALTY MEDICATION ADHERENCE     Medication Adherence    Patient reported X missed doses in the last month: 0  Specialty Medication: HUMIRA(CF) PEN 40 mg/0.4 mL  Patient is on additional specialty medications: No              Were doses missed due to medication being on hold? No    Humira 40/0.4 mg/ml: 1 days of medicine on hand        REFERRAL TO PHARMACIST     Referral to the pharmacist: Not needed      [facility]     Shipping address confirmed in Epic.       Delivery Scheduled: Yes, Expected medication delivery date: 11/30/22.     Medication will be delivered via UPS to the prescription address in Epic WAM.    Willette Pa   [facility] Specialty Technician

## 2022-11-24 NOTE — Unmapped (Signed)
Opened in error

## 2022-11-25 MED ORDER — NITROFURANTOIN MONOHYD MACRO 100 MG PO CAPS
100.0000 mg | ORAL_CAPSULE | Freq: Two times a day (BID) | ORAL | 0 refills | Status: DC
Start: 2022-11-25 — End: 2023-01-11

## 2022-11-25 NOTE — Telephone Encounter (Signed)
Patient informed of the message below.  Patient stated "Dr Casimiro Needle must have forgotten that Macrobid does nothing for her and she has taken this a number of times with no relief".  Message sent to PCP.

## 2022-11-25 NOTE — Telephone Encounter (Signed)
According to the culture it is resistant to PCN. I will call in macrobid 100 mg BID for 7 days. Sent to the pharmacy on file

## 2022-11-25 NOTE — Telephone Encounter (Signed)
Pt call and stated she just talk to you and want to know do she continue to take both medication or should she stop taken the first one also pt stated she want a call back.

## 2022-11-25 NOTE — Telephone Encounter (Signed)
Patient informed of the message below.

## 2022-11-25 NOTE — Telephone Encounter (Signed)
She can stop the amoxicillin since it is not the right antibiotic for the infection

## 2022-11-25 NOTE — Telephone Encounter (Signed)
Patient stated she received a call from another nurse that told her the type of bacteria she has comes from the intestines, is a type of pneumonia and she has had a productive cough with green sputum.  I advised the patient to seek treatment at an urgent care for evaluation as there are no openings available today.

## 2022-11-25 NOTE — Telephone Encounter (Signed)
I understand but the culture results clearly state that the bacteria is susceptible to macrobid --she may view the urine culture on mychart if she would like. Hopefully this will reassure the patient.

## 2022-11-27 NOTE — Unmapped (Unsigned)
[facility]   FOLLOWUP NOTE - INFLAMMATORY BOWEL DISEASE  11/27/2022    Demographics:  Jasmine Mitchell is a 52 y.o. year old female    Diagnosis:  Crohn's Disease  Disease onset (yr): 15  Age at onset:   < 27 yr old (A1)  Location:  Ileocolonic (L3) (mostly colonic)  Behavior:  Perianal (P), Stricturing (B2)  Current Tight Control Scenario:   Maintenance = Biologic          HPI / NOTE :     Interval Events:   1.  Last seen by me Nov 2023.  At that time, continued Humira weekly, continued Desipramine 125mg  at bedtime and recommended increased use of Levsin.   2.  Colonoscopy 06/27/2022 - no active disease. Recommended OTC omeprazole 20mg  daily for x 8 weeks for epigastric burning/dyspepsia, recommended switching from Levsin to Bentyl 10mg  AM/ 20mg  PM and using stool softener for constipation.   3.  No recent labs since Nov 2023    HPI:  ****    Abdominal pain (0-10): none  BM a day: ~2x/day  Consistency: formed  % of stools have blood: 0%  Nocturnal BM: none  Urgency:  mild - only w/ IBS flare  Weight change over last 6 mo: stable  Smoking:  no  NSAIDS: avoids    Review of Systems:   Review of systems positive for: negative except as above.   Otherwise, the balance of 10 systems is negative.          IBD HISTORY:     Brief IBD Disease Course:    - 19 - dx with Crohn's dz ~age 39, sx of diarrhea, abd pain, rectal pain and bloody stools.  Treated with sulfasalazine, Asacol, Pentasa without benefit.  Got courses of steroids and antibiotics on and off.  Tried on but had nausea.   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    - 2004 - Remicade x 9 months. Stopped in 2005 due to 2ndary loss of response.   - 2005 - developed a pyoderma lesion.   - 2010 - Re-established care with Dr. Marcella Dubs.  Concern for internal fistulizing disease.  Was on Cimzia. Had surgery for perianal disease.   - 2011 - Started on Humira.   - 2012 - active perianal disease with a large abscess.  Required EUA, drainage, setons ([place]).   - 2013 - April, dx with pelvic floor dyssynergia and anal sphincter weakness, underwent biofeedback x 2 courses ([place] and [place]).   - 2014 - on - doing well on Humira and long term Cipro.  Having on and off bowel symptoms symptoms which have been thought to be related to intermittent IBS symptoms.   - 2023 - Remains on Humira, uptitrating desipramine and levsin PRN for IBS symptoms.     Endoscopy:      - Colonoscopy 06/17/2003: patch inflammation and ulcers throughout the colon.    - Colonoscopy 01/2009:  Active crohn's at IC valve. Pseudopolyps.  Retraction of IC valve. ?Rectosigmoid fistula tract.  Otherwise normal colon.   - Colonoscopy 10/08/2009:  Perianal fistula, pseudopolyps.  Few aphthae in TI.   - Colonoscopy 07/29/2010:  Poor colon prep, pseudopolyps, stricture at IC valve.  Few ulcers at IC valve. Rectal stricture. Ileal crohn's disease.   - Colonoscopy 05/05/2011:  Pseduopolyps throughout.  4mm cecal polyp. Large fistula in sigmoid colon. Normal TI.   - Colonoscopy 09/06/2012:  Healed scars from prior fistulotomies on perianal exam. Scar in ascending  colon. No active Crohn's.   - Colonoscopy 10/24/13:  Pseudopolyps in rectosigmoid, otherwise normal colon and TI.   - Colonoscopy 10/01/14:  Pseudopolyps in rectum, transverse and ascending colon.  Otherwise normal colon.  Granular TI.  Fixed and open IC valve (not stenotic).   - Colonoscopy 08/12/16:  Mild anal canal stenosis on exam, otherwise normal colon and ileum.     PATH = chronic quiescent colitis (left and right colon) no dysplasia  - Colonoscopy 01/28/2019 - mild anal stricture (dilated to 20mm via [place]), otherwise normal colon and ileum with no active inflammation  - Colonoscopy 06/27/2022 - rectal stricture (dilated to 20mm), some angulation of the colon at 27cm proximal to the anal verge, small internal hemorrhoids. Single 4mm sigmoid polyp. Otherwise normal colon with no active inflammation (SES-CD = 0).     Imaging:    - CT A/P 10/24/2013: mild colonic wall thickening at splenic/descending colon, no other bowel inflammation or stranding.  *Patchy groundglass opacity in left lung base.     Prior IBD medications (type, dose, duration, response):  x 5-ASAs - Sulfasalazine, Asacol, Pentasa  x Oral corticosteroids - Prednisone  []  Intravenous corticosteroids  x Antibiotics - Flagyl - nausea.  Cipro - tolerates well and it helps.   x Thiopurines - - nausea.    TPMT - normal 07/25/17  x Methotrexate - 2005.   x Anti-TNF therapies - Remicade 2004 - 2005 (~6 doses).  Had gradual loss of response so stopped. Tried on Remicade again in 2009 with some effect, increased to 10mg /kg q4 weeks. Cimzia - started 09/2008.  Humira 2014 - dose increased to 80mg  q2 weeks.  04/2016 - humira level 5.6, Ab negative.   []  Cyclosporine  []  Clinical trial medication  []  Other (Please specify):    Extraintestinal manifestations:   -joint pains affecting: n  -eye: n  -skin: n  -oral ulcers :  n  -blood clots: n  -PSC: n  -other: n          Past Medical History:   Past medical history:   Past Surgical History:   Procedure Laterality Date    FRACTURE SURGERY      left wrist    GASTROCUTANEOUS FISTULA CLOSURE      ORTHOPEDIC SURGERY      PR COLONOSCOPY FLX DX W/COLLJ SPEC WHEN PFRMD N/A 10/24/2013    Procedure: COLONOSCOPY, FLEXIBLE, PROXIMAL TO SPLENIC FLEXURE; DIAGNOSTIC, W/WO COLLECTION SPECIMEN BY BRUSH OR WASH;  Surgeon: Theadore Nan, MD;  [facility];  Service: Gastroenterology    PR COLONOSCOPY W/BIOPSY SINGLE/MULTIPLE N/A 10/01/2014    Procedure: COLONOSCOPY, FLEXIBLE, PROXIMAL TO SPLENIC FLEXURE; WITH BIOPSY, SINGLE OR MULTIPLE;  Surgeon: Teodoro Spray, MD;  [facility]  Service: Gastroenterology    PR COLONOSCOPY W/BIOPSY SINGLE/MULTIPLE N/A 08/12/2016    Procedure: COLONOSCOPY, FLEXIBLE, PROXIMAL TO SPLENIC FLEXURE; WITH BIOPSY, SINGLE OR MULTIPLE;  Surgeon: Zetta Bills, MD;  [facility];  Service: Gastroenterology    PR COLONOSCOPY W/BIOPSY SINGLE/MULTIPLE Left 01/28/2019    Procedure: COLONOSCOPY, FLEXIBLE, PROXIMAL TO SPLENIC FLEXURE; WITH BIOPSY, SINGLE OR MULTIPLE;  Surgeon: Zetta Bills, MD;  [facility];  Service: Gastroenterology    PR COLONOSCOPY W/BIOPSY SINGLE/MULTIPLE Left 06/27/2022    Procedure: COLONOSCOPY, FLEXIBLE, PROXIMAL TO SPLENIC FLEXURE; WITH BIOPSY, SINGLE OR MULTIPLE;  Surgeon: Zetta Bills, MD;  [facility];  Service: Gastroenterology    PR COLSC FLX W/RMVL OF TUMOR  POLYP LESION SNARE TQ Left 01/28/2019    Procedure: COLONOSCOPY FLEX; W/REMOV TUMOR/LES BY SNARE;  Surgeon: Zetta Bills, MD;  [facility]: [facility];  Service: Gastroenterology    PR COLSC FLX W/RMVL OF TUMOR POLYP LESION SNARE TQ N/A 06/27/2022    Procedure: COLONOSCOPY FLEX; W/REMOV TUMOR/LES BY SNARE;  Surgeon: Zetta Bills, MD;  [facility];  Service: Gastroenterology    PR DILATION RECTAL STRICTURE W ANEST Left 06/27/2022    Procedure: DILATION OF RECTAL STRICTURE (SEPARATE PROCEDURE) UNDER ANESTHESIA OTHER THAN LOCAL;  Surgeon: Zetta Bills, MD;  [facility]: [facility];  Service: Gastroenterology    PR SURG DIAGNOSTIC EXAM, ANORECTAL N/A 12/23/2016    Procedure: ANORECTAL EXAM, SURGICAL, REQUIRING ANESTHESIA (GENERAL, SPINAL, OR EPIDURAL), DIAGNOSTIC;  Surgeon: Mickle Asper, MD;  Location: MAIN OR [place];  Service: Gastrointestinal    PR UPPER GI ENDOSCOPY,DIAGNOSIS N/A 08/12/2016    Procedure: UGI ENDO, INCLUDE ESOPHAGUS, STOMACH, & DUODENUM &/OR JEJUNUM; DX W/WO COLLECTION SPECIMN, BY BRUSH OR WASH;  Surgeon: Zetta Bills, MD;  [facility];  Service: Gastroenterology    TUBAL LIGATION       Past surgical history:   Past Surgical History:   Procedure Laterality Date    FRACTURE SURGERY      left wrist    GASTROCUTANEOUS FISTULA CLOSURE ORTHOPEDIC SURGERY      PR COLONOSCOPY FLX DX W/COLLJ SPEC WHEN PFRMD N/A 10/24/2013    Procedure: COLONOSCOPY, FLEXIBLE, PROXIMAL TO SPLENIC FLEXURE; DIAGNOSTIC, W/WO COLLECTION SPECIMEN BY BRUSH OR WASH;  Surgeon: Theadore Nan, MD;  [facility];  Service: Gastroenterology    PR COLONOSCOPY W/BIOPSY SINGLE/MULTIPLE N/A 10/01/2014    Procedure: COLONOSCOPY, FLEXIBLE, PROXIMAL TO SPLENIC FLEXURE; WITH BIOPSY, SINGLE OR MULTIPLE;  Surgeon: Teodoro Spray, MD;  [facility];  Service: Gastroenterology    PR COLONOSCOPY W/BIOPSY SINGLE/MULTIPLE N/A 08/12/2016    Procedure: COLONOSCOPY, FLEXIBLE, PROXIMAL TO SPLENIC FLEXURE; WITH BIOPSY, SINGLE OR MULTIPLE;  Surgeon: Zetta Bills, MD;  [facility];  Service: Gastroenterology    PR COLONOSCOPY W/BIOPSY SINGLE/MULTIPLE Left 01/28/2019    Procedure: COLONOSCOPY, FLEXIBLE, PROXIMAL TO SPLENIC FLEXURE; WITH BIOPSY, SINGLE OR MULTIPLE;  Surgeon: Zetta Bills, MD;  [facility];  Service: Gastroenterology    PR COLONOSCOPY W/BIOPSY SINGLE/MULTIPLE Left 06/27/2022    Procedure: COLONOSCOPY, FLEXIBLE, PROXIMAL TO SPLENIC FLEXURE; WITH BIOPSY, SINGLE OR MULTIPLE;  Surgeon: Zetta Bills, MD;  [facility];  Service: Gastroenterology    PR COLSC FLX W/RMVL OF TUMOR POLYP LESION SNARE TQ Left 01/28/2019    Procedure: COLONOSCOPY FLEX; W/REMOV TUMOR/LES BY SNARE;  Surgeon: Zetta Bills, MD;  [facility];  Service: Gastroenterology    PR COLSC FLX W/RMVL OF TUMOR POLYP LESION SNARE TQ N/A 06/27/2022    Procedure: COLONOSCOPY FLEX; W/REMOV TUMOR/LES BY SNARE;  Surgeon: Zetta Bills, MD;  [facility];  Service: Gastroenterology    PR DILATION RECTAL STRICTURE W ANEST Left 06/27/2022    Procedure: DILATION OF RECTAL STRICTURE (SEPARATE PROCEDURE) UNDER ANESTHESIA OTHER THAN LOCAL;  Surgeon: Zetta Bills, MD;  [facility];  Service: Gastroenterology    PR SURG DIAGNOSTIC EXAM, ANORECTAL N/A 12/23/2016    Procedure: ANORECTAL EXAM, SURGICAL, REQUIRING ANESTHESIA (GENERAL, SPINAL, OR EPIDURAL), DIAGNOSTIC;  Surgeon: Mickle Asper, MD;  Location: MAIN OR [place];  Service: Gastrointestinal    PR UPPER GI ENDOSCOPY,DIAGNOSIS N/A  08/12/2016    Procedure: UGI ENDO, INCLUDE ESOPHAGUS, STOMACH, & DUODENUM &/OR JEJUNUM; DX W/WO COLLECTION SPECIMN, BY BRUSH OR WASH;  Surgeon: Zetta Bills, MD;  [facility] [facility];  Service: Gastroenterology    TUBAL LIGATION       Family history:   Family History   Problem Relation Name Age of Onset    Cancer Sister      Cancer Paternal Grandmother      Colorectal Cancer Neg Hx       Social history:   Social History     Socioeconomic History    Marital status: Married   Tobacco Use    Smoking status: Never    Smokeless tobacco: Never   Vaping Use    Vaping status: Never Used   Substance and Sexual Activity    Alcohol use: Yes     Alcohol/week: 0.0 standard drinks of alcohol     Comment: socially     Drug use: No     Social Determinants of Health     Financial Resource Strain: Low Risk  (11/22/2021)    Received from [facility]    Overall Financial Resource Strain (CARDIA)     Difficulty of Paying Living Expenses: Not very hard   Food Insecurity: No Food Insecurity (11/22/2021)    Received from [facility]    Hunger Vital Sign     Worried About Running Out of Food in the Last Year: Never true     Ran Out of Food in the Last Year: Never true   Transportation Needs: No Transportation Needs (11/22/2021)    Received from [facility] - Transportation     Lack of Transportation (Medical): No     Lack of Transportation (Non-Medical): No   Physical Activity: Unknown (11/22/2021)    Received from [facility]    Exercise Vital Sign     Days of Exercise per Week: 0 days   Stress: No Stress Concern Present (11/22/2021)    Received from [facility] - Occupational Stress Questionnaire     Feeling of Stress : Not at all   Social Connections: Unknown (11/22/2021)    Received from [facility]    Social Connection and Isolation Panel [NHANES]     Frequency of Communication with Friends and Family: Three times a week     Frequency of Social Gatherings with Friends and Family: Once a week     Attends Religious Services: Patient refused     Active Member of Clubs or Organizations: Patient refused     Marital Status: Married             Allergies:     Allergies   Allergen Reactions    Oxycodone (Bulk) Nausea And Vomiting     Patient tolerates acetaminophen well             Medications:     Current Outpatient Medications   Medication Sig Dispense Refill    acetaminophen (TYLENOL) 500 MG tablet Take 2 tablets (1,000 mg total) by mouth every four (4) hours as needed for pain.      ADALIMUMAB PEN CITRATE FREE 40 MG/0.4 ML Inject the contents of 1 pen (40 mg total) under the skin once a week. 4 each 5    buPROPion (WELLBUTRIN XL) 150 MG 24 hr tablet Take 1 tablet (150 mg total) by mouth daily.      desipramine (NORPRAMIN) 25 MG tablet Take 4  tablets (100 mg total) by mouth nightly. 360 tablet 0    dextroamphetamine-amphetamine (ADDERALL XR) 20 MG 24 hr capsule Take 1 capsule (20 mg total) by mouth daily.      dextroamphetamine-amphetamine (ADDERALL) 10 mg tablet TAKE 1 TABLET DAILY AFTER LUNCH.      dicyclomine (BENTYL) 10 mg capsule Take 1 capsule (10 mg total) by mouth Four (4) times a day (before meals and nightly). 120 capsule 11    escitalopram oxalate (LEXAPRO) 5 MG tablet Take 1 tablet (5 mg total) by mouth daily. TAKE 1 TABLET (5 MG TOTAL) BY MOUTH DAILY.      fluticasone (FLONASE) 50 mcg/actuation nasal spray 1 SPRAY BY EACH NARE ROUTE DAILY. AS DIRECTED (Patient taking differently: 1 spray by Each Nare route daily as needed. As directed) 16 mL 2    MULTIVIT WITH CALCIUM,IRON,MIN (WOMEN'S DAILY MULTIVITAMIN ORAL) Take 1 tablet by mouth once daily.      ondansetron (ZOFRAN-ODT) 8 MG disintegrating tablet Take 1 tablet (8 mg total) by mouth every eight (8) hours as needed. (Patient not taking: Reported on 06/27/2022)      polyethylene glycol (CLEARLAX) 17 gram/dose powder Take as directed for split prep. 238 g 0    polyethylene glycol (CLEARLAX) 17 gram/dose powder Take as directed for split prep. 119 g 0    predniSONE (DELTASONE) 10 mg tablet pack Take by mouth daily. 6 day dose pack for hurt arm.      simethicone (MYLICON) 125 MG chewable tablet Take 2 tablets (250mg  total) with prep as directed. 4 tablet 0     No current facility-administered medications for this visit.           Physical Exam:   LMP 12/02/2016 (Approximate)   ***  GEN: no apparent distress, appears comfortable on exam  HEENT: OP clear with no erythema, lesions, exudate, mucous membranes moist  NECK: Supple, no lymphadenopathy  LUNGS: CTAB, no wheezes, rales, or rhonchi  CV: S1/S2, RRR, no murmurs  ABD: Soft, nontender, no rebound/guarding, nondistended, normoactive bowel sounds, no appreciable organomegaly  Extremities: no cyanosis, clubbing or edema, normal gait  Psych: affect appropriate, A&O x3  SKIN: no visible lesions on face, neck, arms, abdomen          Labs, Data & Indices:     Lab Review:   Lab Results   Component Value Date    WBC 4.9 05/17/2022    WBC 6.5 04/29/2016    RBC 4.31 05/17/2022    RBC 4.50 04/29/2016    HGB 13.3 05/17/2022    HGB 13.2 04/29/2016     Lab Results   Component Value Date    AST 20 05/17/2022    AST 13 04/29/2016    ALT 18 05/17/2022    ALT 9 04/29/2016    BUN 11 12/09/2021    BUN 11 04/29/2016    Creatinine 0.79 05/17/2022    Creatinine 0.80 04/29/2016    CO2 27.8 12/09/2021    CO2 21 04/29/2016    Albumin 4.1 12/09/2021    Albumin 4.3 08/19/2014    Calcium 9.9 12/09/2021    Calcium 9.4 04/29/2016     Lab Results   Component Value Date    TSH 2.810 04/29/2016 ...........................................................................................................................................Marland Kitchen   Diagnosis ICD-10-CM Associated Orders   1. Crohn's disease of both small and large intestine without complication (CMS-HCC)  K50.80       2. Irritable bowel syndrome with diarrhea  K58.0  Assessment & Recommendations:   Disease state:  Ileocolonic Crohn's with perianal disease and anorectal stricture    Neneh Adami is a 52 y.o. female with a long standing hx of stricturing ileocolonic Crohn's disease.  She also has hx of perianal disease (last requiring drainage by Dr. Elenore Rota 2018).  She is currently maintained on weekly Humira monotherapy in clinical and endoscopic remission on this therapy. She also has concurrent Irritable bowel syndrome with diarrhea and pain, which has responded reasonably well to desipramine, particularly on higher dose (she is also on Wellbutrin).  She has good response to Levsin prn for cramping, discussed trying this for times of diarrhea as well. She notes diet can be a large contributor to symptoms (especially sugar cravings), reviewed behavioral interventions for this and offered nutritionist and/or medical weight loss clinic referrals. ***    PLAN:  1.  Continue Humira weekly  2.  Update labs today including CBC, Cr, AST, ALT  3.  Continue Desipramine to 125mg  (5 tablets) daily  4.  Levsin SL prn for cramping, recommend also trying to episodes of unanticipated diarrhea/urgency  5.  Colonoscopy scheduled for Jan 2024  6.  Will notify us if she desires nutritionist or medical weight loss referrals  7.  Clinic follow up in 6 months.     IBD health maintenance:  Influenza vaccine: 2012, 117/17, 04/2022  Pneumonia vaccine: Prevnar 02/2014; Pneumovax 3//08/2018  COVID Vaccine:  Pfizer April 2021 x 2 doses, 3rd shot 04/2020  Hepatitis B:   TB testing:   Chickenpox/Shingles history: Shingrix #18 November 2021, #2 04/2022  Bone denistometry: Derm appointment:  Last small bowel imaging:   Last colonoscopy: 01/2019  PAP smear:   --------------------------------------------  Author: Zetta Bills, MD 11/27/2022 2:01 PM    Zetta Bills, MD  Associate Professor of Medicine  Division of Gastroenterology & Hepatology  [place] [facility]  =========================================

## 2022-11-29 ENCOUNTER — Ambulatory Visit: Admit: 2022-11-29 | Payer: PRIVATE HEALTH INSURANCE

## 2022-11-29 MED FILL — HUMIRA PEN CITRATE FREE 40 MG/0.4 ML: SUBCUTANEOUS | 28 days supply | Qty: 4 | Fill #5

## 2022-11-30 NOTE — Unmapped (Signed)
Patient was a no show for GI clinic visit with me today.

## 2022-12-21 DIAGNOSIS — K5080 Crohn's disease of both small and large intestine without complications: Principal | ICD-10-CM

## 2022-12-21 MED ORDER — HUMIRA PEN CITRATE FREE 40 MG/0.4 ML
SUBCUTANEOUS | 0 refills | 28 days | Status: CP
Start: 2022-12-21 — End: ?
  Filled 2022-12-27: qty 4, 28d supply, fill #0

## 2022-12-21 NOTE — Unmapped (Signed)
[facility] Pharm[facility] Refill Coordination Note    Jasmine Mitchell, [place]: February 07, 1971  Phone: [phone] (home)       All above HIPAA information was verified with patient.         12/21/2022    11:36 AM   Specialty Rx Medication Refill Questionnaire   Which Medications would you like refilled and shipped? Humira 40mg  1 injection each week   Please list all current allergies: Percocet   Have you missed any doses in the last 30 days? No   Have you had any changes to your medication(s) since your last refill? No   How many days remaining of each medication do you have at home? 1 injection pen   If receiving an injectable medication, next injection date is 12/23/2022   Have you experienced any side effects in the last 30 days? No   Please enter the full address (street address, city, state, zip code) where you would like your medication(s) to be delivered to. [address] [place] [address]   Please specify on which day you would like your medication(s) to arrive. Note: if you need your medication(s) within 3 days, please call the pharmacy to schedule your order at [phone]  12/28/2022   Has your insurance changed since your last refill? No   Would you like a pharmacist to call you to discuss your medication(s)? No   Do you require a signature for your package? (Note: if we are billing Medicare Part B or your order contains a controlled substance, we will require a signature) No         Completed refill call assessment today to schedule patient's medication shipment from the [facility] [phone]).  All relevant notes have been reviewed.       Confirmed patient received a Conservation officer, historic buildings and a Surveyor, mining with first shipment. The patient will receive a drug information handout for each medication shipped and additional FDA Medication Guides as required.         REFERRAL TO PHARMACIST     Referral to the pharmacist: Not needed      [facility]     Shipping address confirmed in Epic.     Delivery Scheduled: Yes, Expected medication delivery date: 12/28/2022.     Medication will be delivered via UPS to the prescription address in Epic WAM.    Dorisann Frames   [facility] Specialty Technician

## 2022-12-21 NOTE — Unmapped (Signed)
Refill request for Humira sent to the pharmacy for 1 month supply. Pt due for appt, GIS messaged to schedule.

## 2022-12-31 ENCOUNTER — Other Ambulatory Visit: Payer: Self-pay | Admitting: Family Medicine

## 2022-12-31 DIAGNOSIS — F419 Anxiety disorder, unspecified: Secondary | ICD-10-CM

## 2023-01-11 ENCOUNTER — Encounter: Payer: Self-pay | Admitting: Family Medicine

## 2023-01-11 ENCOUNTER — Ambulatory Visit (INDEPENDENT_AMBULATORY_CARE_PROVIDER_SITE_OTHER): Payer: BC Managed Care – PPO | Admitting: Family Medicine

## 2023-01-11 VITALS — BP 100/62 | HR 105 | Temp 98.1°F | Ht 66.0 in | Wt 222.0 lb

## 2023-01-11 DIAGNOSIS — E559 Vitamin D deficiency, unspecified: Secondary | ICD-10-CM | POA: Diagnosis not present

## 2023-01-11 DIAGNOSIS — F988 Other specified behavioral and emotional disorders with onset usually occurring in childhood and adolescence: Secondary | ICD-10-CM

## 2023-01-11 DIAGNOSIS — Z13220 Encounter for screening for lipoid disorders: Secondary | ICD-10-CM

## 2023-01-11 DIAGNOSIS — R413 Other amnesia: Secondary | ICD-10-CM | POA: Diagnosis not present

## 2023-01-11 MED ORDER — AMPHETAMINE-DEXTROAMPHET ER 30 MG PO CP24: 30.0000 mg | ORAL_CAPSULE | Freq: Every day | ORAL | 0 refills | Status: DC

## 2023-01-11 MED ORDER — AMPHETAMINE-DEXTROAMPHETAMINE 10 MG PO TABS: ORAL_TABLET | ORAL | 0 refills | Status: DC

## 2023-01-11 NOTE — Assessment & Plan Note (Signed)
I believe her memory issues are associated with the ADHD, I recommend increasing the adderall to 30 mg daily, continue 10 mg in the afternoon. She does not have any other deficits on exam. She reports that the lexapro 10 mg is working very well for her anxiety and she wants to continue this medication. RTC 3 months for med refills, I am ordering her blood work today to rule out any other causes of memory loss.

## 2023-01-11 NOTE — Progress Notes (Signed)
Established Patient Office Visit  Subjective   Patient ID: Katrina Grant, female    DOB: [birth year]  Age: 52 y.o. MRN: [id number]  Chief Complaint  Patient presents with   Medical Management of Chronic Issues   Memory Loss    Patient complains of problems with short term memory loss x3-4 months    Pt is here for follow up on ADHD med refills and she is reporting a new symptom of increasing forgetfulness. She states she is forgetting dates and times for things like appointments, conversations, states that she is forgetting meetings and other things. Thinks that it has bene going on for the last 3-4 months. We had increased her lexapro in April and I asked if this might be causing issues, however she doesn't think this is the case. Is able to give me an accurate history of the day's previous events.     Current Outpatient Medications  Medication Instructions   acetaminophen (TYLENOL) 1,000 mg, Oral, Every 6 hours PRN, For migraines and stomach upset    adalimumab (HUMIRA) 40 mg, Weekly   albuterol (VENTOLIN HFA) 108 (90 Base) MCG/ACT inhaler 2 puffs, Inhalation, Every 6 hours PRN   amphetamine-dextroamphetamine (ADDERALL XR) 30 MG 24 hr capsule 30 mg, Oral, Daily   amphetamine-dextroamphetamine (ADDERALL) 10 MG tablet Take 1 tablet daily after lunch.   buPROPion (WELLBUTRIN XL) 300 mg, Oral, Daily after breakfast   cyclobenzaprine (FLEXERIL) 5 mg, Oral, 3 times daily PRN   escitalopram (LEXAPRO) 10 mg, Oral, Daily   ibuprofen (ADVIL) 600 mg, Oral, Every 8 hours PRN   ondansetron (ZOFRAN ODT) 8 mg, Oral, Every 8 hours PRN   Prenatal Vit-Fe Fumarate-FA (M-NATAL PLUS) 27-1 MG TABS TAKE 1 TABLET BY MOUTH EVERY DAY BEFORE BREAKFAST    Patient Active Problem List   Diagnosis Date Noted   Anxiety disorder 02/01/2022   Recurrent UTI 02/01/2022   ADD (attention deficit disorder) 10/08/2021   BMI 38.0-38.9,adult 11/24/2016   Crohn's disease with complication (HCC) 11/20/2013    Fibromyalgia - managed by Dr. Kathi Ludwig, [facility] - takes Neurontin and Tramadol 11/20/2013   DUB (dysfunctional uterine bleeding) 03/12/2011      Review of Systems  All other systems reviewed and are negative.     Objective:     BP 100/62 (BP Location: Left Arm, Patient Position: Sitting, Cuff Size: Large)   Pulse (!) 105   Temp 98.1 F (36.7 C) (Oral)   Ht 5\' 6"  (1.676 m)   Wt 222 lb (100.7 kg)   LMP 12/24/2020 (Approximate)   SpO2 98%   BMI 35.83 kg/m    Physical Exam Constitutional:      Appearance: Normal appearance. She is obese.  Eyes:     Conjunctiva/sclera: Conjunctivae normal.  Cardiovascular:     Rate and Rhythm: Normal rate and regular rhythm.     Pulses: Normal pulses.     Heart sounds: Normal heart sounds.  Pulmonary:     Effort: Pulmonary effort is normal.     Breath sounds: Normal breath sounds. No wheezing, rhonchi or rales.  Neurological:     General: No focal deficit present.     Mental Status: She is alert and oriented to person, place, and time. Mental status is at baseline.  Psychiatric:        Mood and Affect: Mood normal.        Behavior: Behavior normal.        Thought Content: Thought content normal.  No results found for any visits on 01/11/23.    The 10-year ASCVD risk score (Arnett DK, et al., 2019) is: 0.8%    Assessment & Plan:  Memory loss, short term -     Comprehensive metabolic panel; Future -     TSH; Future  Attention deficit disorder, unspecified hyperactivity presence Assessment & Plan: I believe her memory issues are associated with the ADHD, I recommend increasing the adderall to 30 mg daily, continue 10 mg in the afternoon. She does not have any other deficits on exam. She reports that the lexapro 10 mg is working very well for her anxiety and she wants to continue this medication. RTC 3 months for med refills, I am ordering her blood work today to rule out any other causes of memory loss.   Orders: -      Amphetamine-Dextroamphet ER; Take 1 capsule (30 mg total) by mouth daily.  Dispense: 90 capsule; Refill: 0 -     Amphetamine-Dextroamphetamine; Take 1 tablet daily after lunch.  Dispense: 90 tablet; Refill: 0  Vitamin D deficiency -     VITAMIN D 25 Hydroxy (Vit-D Deficiency, Fractures); Future  Lipid screening -     Lipid panel; Future     Return in about 3 months (around 04/13/2023) for video visit for ADHD med refills.    Karie Georges, MD

## 2023-01-17 ENCOUNTER — Other Ambulatory Visit: Payer: BC Managed Care – PPO

## 2023-01-19 DIAGNOSIS — K5080 Crohn's disease of both small and large intestine without complications: Principal | ICD-10-CM

## 2023-01-19 DIAGNOSIS — K580 Irritable bowel syndrome with diarrhea: Principal | ICD-10-CM

## 2023-01-19 MED ORDER — HUMIRA PEN CITRATE FREE 40 MG/0.4 ML
SUBCUTANEOUS | 0 refills | 28 days | Status: CP
Start: 2023-01-19 — End: ?
  Filled 2023-01-30: qty 4, 28d supply, fill #0

## 2023-01-19 MED ORDER — DESIPRAMINE 25 MG TABLET
ORAL_TABLET | 2 refills | 0 days
Start: 2023-01-19 — End: ?

## 2023-01-19 NOTE — Unmapped (Signed)
Last visit:06/27/22    Next visit:04/04/23    Labs:05/17/22    Medication refilled: Humira x71m

## 2023-01-24 NOTE — Unmapped (Signed)
[facility] Refill Coordination Note    Specialty Medication(s) to be Shipped:   Inflammatory Disorders: Humira    Other medication(s) to be shipped: No additional medications requested for fill at this time     Jasmine Mitchell, DOB: 1971-06-20  Phone: [phone] (home)       All above HIPAA information was verified with patient.     Was a Nurse, learning disability used for this call? No    Completed refill call assessment today to schedule patient's medication shipment from the [facility] [phone]).  All relevant notes have been reviewed.     Specialty medication(s) and dose(s) confirmed: Regimen is correct and unchanged.   Changes to medications: Jasmine Mitchell reports no changes at this time.  Changes to insurance: No  New side effects reported not previously addressed with a pharmacist or physician: None reported  Questions for the pharmacist: No    Confirmed patient received a Conservation officer, historic buildings and a Surveyor, mining with first shipment. The patient will receive a drug information handout for each medication shipped and additional FDA Medication Guides as required.       DISEASE/MEDICATION-SPECIFIC INFORMATION        For patients on injectable medications: Patient currently has 1 doses left.  Next injection is scheduled for 8/9.    SPECIALTY MEDICATION ADHERENCE     Medication Adherence    Patient reported X missed doses in the last month: 0  Specialty Medication: HUMIRA(CF) PEN 40 mg/0.4 mL  Patient is on additional specialty medications: No              Were doses missed due to medication being on hold? No     Humira 40/0.4 mg/mL: 3 days of medicine on hand    REFERRAL TO PHARMACIST     Referral to the pharmacist: Not needed      [facility]     Shipping address confirmed in Epic.       Delivery Scheduled: Yes, Expected medication delivery date: 01/31/23.     Medication will be delivered via UPS to the prescription address in Epic WAM.    Jasmine Mitchell   [facility] Specialty Technician

## 2023-02-06 ENCOUNTER — Other Ambulatory Visit (INDEPENDENT_AMBULATORY_CARE_PROVIDER_SITE_OTHER): Payer: BC Managed Care – PPO

## 2023-02-06 DIAGNOSIS — R413 Other amnesia: Secondary | ICD-10-CM

## 2023-02-06 DIAGNOSIS — Z13220 Encounter for screening for lipoid disorders: Secondary | ICD-10-CM

## 2023-02-06 DIAGNOSIS — E559 Vitamin D deficiency, unspecified: Secondary | ICD-10-CM

## 2023-02-06 LAB — LIPID PANEL
Cholesterol: 249 mg/dL — ABNORMAL HIGH (ref 0–200)
HDL: 69.4 mg/dL (ref >39.00)
LDL Cholesterol: 160 mg/dL — ABNORMAL HIGH (ref 0–99)
NonHDL: 180.01
Total CHOL/HDL Ratio: 4
Triglycerides: 102 mg/dL (ref 0.0–149.0)
VLDL: 20.4 mg/dL (ref 0.0–40.0)

## 2023-02-06 LAB — VITAMIN D 25 HYDROXY (VIT D DEFICIENCY, FRACTURES): VITD: 29.93 ng/mL — ABNORMAL LOW (ref 30.00–100.00)

## 2023-02-06 LAB — COMPREHENSIVE METABOLIC PANEL
ALT: 15 U/L (ref 0–35)
AST: 18 U/L (ref 0–37)
Albumin: 4.1 g/dL (ref 3.5–5.2)
Alkaline Phosphatase: 64 U/L (ref 39–117)
BUN: 12 mg/dL (ref 6–23)
CO2: 23 mEq/L (ref 19–32)
Calcium: 9.6 mg/dL (ref 8.4–10.5)
Chloride: 105 mEq/L (ref 96–112)
Creatinine, Ser: 0.86 mg/dL (ref 0.40–1.20)
GFR: 77.55 mL/min (ref >60.00)
Glucose, Bld: 94 mg/dL (ref 70–99)
Potassium: 4 mEq/L (ref 3.5–5.1)
Sodium: 139 mEq/L (ref 135–145)
Total Bilirubin: 0.6 mg/dL (ref 0.2–1.2)
Total Protein: 7.8 g/dL (ref 6.0–8.3)

## 2023-02-06 LAB — TSH: TSH: 2.79 u[IU]/mL (ref 0.35–5.50)

## 2023-02-12 MED ORDER — DESIPRAMINE 25 MG TABLET
ORAL_TABLET | 2 refills | 0 days | Status: CP
Start: 2023-02-12 — End: ?

## 2023-02-22 DIAGNOSIS — K5080 Crohn's disease of both small and large intestine without complications: Principal | ICD-10-CM

## 2023-02-22 MED ORDER — HUMIRA PEN CITRATE FREE 40 MG/0.4 ML
SUBCUTANEOUS | 1 refills | 28 days | Status: CP
Start: 2023-02-22 — End: ?
  Filled 2023-03-20: qty 4, 28d supply, fill #0

## 2023-02-22 NOTE — Unmapped (Signed)
Encounter for refill request:  Last clinic visit: 05/17/2022  Colonoscopy: 06/27/22  Appointments which have been scheduled for you      Apr 04, 2023 8:00 AM  (Arrive by 7:45 AM)  RETURN IBD with Zetta Bills, MD  [facility] GI MEDICINE [place] [facility]) [address]  [facility] 1 through 4  [place] [address]  [phone]             Lab Results   Component Value Date    WBC 4.9 05/17/2022    RBC 4.31 05/17/2022    HGB 13.3 05/17/2022    HCT 39.4 05/17/2022    PLT 241 05/17/2022    ALT 18 05/17/2022    AST 20 05/17/2022    ALKPHOS 76 12/09/2021    CRP <4.0 12/09/2021       Humira refills authorized x 2 month supply.

## 2023-03-14 NOTE — Unmapped (Signed)
[facility] Specialty and Home Delivery Pharmacy Refill Coordination Note    Jasmine Mitchell, [place]: March 17, 1971  Phone: [phone] (home)       All above HIPAA information was verified with patient.         03/13/2023    10:12 PM   Specialty Rx Medication Refill Questionnaire   Which Medications would you like refilled and shipped? Humira injection pens 40mg    Please list all current allergies: Percocet   Have you missed any doses in the last 30 days? Yes   If Yes, how many doses have you missed ? 0-2   Have you had any changes to your medication(s) since your last refill? No   How many days remaining of each medication do you have at home? None   If receiving an injectable medication, next injection date is 03/17/2023   Have you experienced any side effects in the last 30 days? No   Please enter the full address (street address, city, state, zip code) where you would like your medication(s) to be delivered to. [address], [place] [address]   Please specify on which day you would like your medication(s) to arrive. Note: if you need your medication(s) within 3 days, please call the pharmacy to schedule your order at [phone]  03/16/2023   Has your insurance changed since your last refill? No   Would you like a pharmacist to call you to discuss your medication(s)? No   Do you require a signature for your package? (Note: if we are billing Medicare Part B or your order contains a controlled substance, we will require a signature) No         Completed refill call assessment today to schedule patient's medication shipment from the [facility] Special[facility] and Home Delivery Pharmacy [phone]).  All relevant notes have been reviewed.       Confirmed patient received a Conservation officer, historic buildings and a Surveyor, mining with first shipment. The patient will receive a drug information handout for each medication shipped and additional FDA Medication Guides as required.         REFERRAL TO PHARMACIST     Referral to the pharmacist: Not needed      [facility]     Shipping address confirmed in Epic.     Delivery Scheduled: Yes, Expected medication delivery date: 03/16/23.     Medication will be delivered via UPS to the prescription address in Epic WAM.    Willette Pa   [facility] Special[facility] and Home Delivery Pharmacy Specialty Technician

## 2023-03-16 NOTE — Unmapped (Signed)
Jasmine Mitchell 's HUMIRA(CF) PEN 40 mg/0.4 mL injection (adalimumab) shipment will be rescheduled as a result of  - [employer] is working on fixing [place] in their system (24hour turn around time)     I have spoken with the patient  at [phone]  and communicated the delivery change. We will reschedule the medication for the delivery date that the patient agreed upon.  We have confirmed the delivery date as 03/21/23

## 2023-04-13 ENCOUNTER — Encounter: Payer: Self-pay | Admitting: Family Medicine

## 2023-04-13 ENCOUNTER — Telehealth: Payer: BC Managed Care – PPO | Admitting: Family Medicine

## 2023-04-13 VITALS — Temp 96.4°F | Wt 227.0 lb

## 2023-04-13 DIAGNOSIS — F902 Attention-deficit hyperactivity disorder, combined type: Secondary | ICD-10-CM

## 2023-04-13 DIAGNOSIS — M722 Plantar fascial fibromatosis: Secondary | ICD-10-CM

## 2023-04-13 DIAGNOSIS — F419 Anxiety disorder, unspecified: Secondary | ICD-10-CM | POA: Diagnosis not present

## 2023-04-13 MED ORDER — BUPROPION HCL ER (XL) 300 MG PO TB24
300.0000 mg | ORAL_TABLET | Freq: Every day | ORAL | 3 refills | Status: DC
Start: 2023-04-13 — End: 2024-05-03

## 2023-04-13 MED ORDER — ESCITALOPRAM OXALATE 20 MG PO TABS
20.0000 mg | ORAL_TABLET | Freq: Every day | ORAL | 1 refills | Status: DC
Start: 2023-04-13 — End: 2024-01-25

## 2023-04-13 MED ORDER — DICLOFENAC SODIUM 1 % EX GEL: 2.0000 g | Freq: Four times a day (QID) | CUTANEOUS | 2 refills | Status: AC

## 2023-04-13 MED ORDER — AMPHETAMINE-DEXTROAMPHETAMINE 10 MG PO TABS
ORAL_TABLET | ORAL | 0 refills | Status: DC
Start: 2023-04-13 — End: 2024-01-25

## 2023-04-13 MED ORDER — AMPHETAMINE-DEXTROAMPHET ER 30 MG PO CP24
30.0000 mg | ORAL_CAPSULE | Freq: Every day | ORAL | 0 refills | Status: DC
Start: 2023-04-13 — End: 2024-01-25

## 2023-04-13 NOTE — Assessment & Plan Note (Signed)
Her medication is functioning well per her report. No side effects reported at this time. I wonder if the stimulants and wellbutrin are contributing her her anxiety on some level? Will refill her medication as prescribed.

## 2023-04-13 NOTE — Assessment & Plan Note (Signed)
Pt reports an increase in anxiety recently due to caring for her elderly mother and losing her job. She has been taking 2 tablets of the lexapro for the last week, will increase her medication to 20 mg daily and see her back for another video visit in 3 months.

## 2023-04-13 NOTE — Progress Notes (Signed)
Virtual Medical Office Visit  Patient:  Katrina Grant      Age: 52 y.o.       Sex:  female  Date:   04/13/2023  PCP:    Karie Georges, MD   Today's Healthcare Provider: Karie Georges, MD    Assessment/Plan:   Summary assessment:  Katrina Grant was seen today for medical management of chronic issues and sinus problem.  Plantar fasciitis Most likely diagnosis given her symptoms, recommended diclofenac gel QID PRN for heel pain.   -     Diclofenac Sodium; Apply 2 g topically 4 (four) times daily.  Dispense: 350 g; Refill: 2  Attention deficit hyperactivity disorder (ADHD), combined type Assessment & Plan: Her medication is functioning well per her report. No side effects reported at this time. I wonder if the stimulants and wellbutrin are contributing her her anxiety on some level? Will refill her medication as prescribed.   Orders: -     Amphetamine-Dextroamphet ER; Take 1 capsule (30 mg total) by mouth daily.  Dispense: 90 capsule; Refill: 0 -     Amphetamine-Dextroamphetamine; Take 1 tablet daily after lunch.  Dispense: 90 tablet; Refill: 0  Anxiety disorder, unspecified type Assessment & Plan: Pt reports an increase in anxiety recently due to caring for her elderly mother and losing her job. She has been taking 2 tablets of the lexapro for the last week, will increase her medication to 20 mg daily and see her back for another video visit in 3 months.   Orders: -     Escitalopram Oxalate; Take 1 tablet (20 mg total) by mouth daily.  Dispense: 90 tablet; Refill: 1 -     buPROPion HCl ER (XL); Take 1 tablet (300 mg total) by mouth daily after breakfast.  Dispense: 90 tablet; Refill: 3     Return in about 3 months (around 07/14/2023) for video visit for medication refills.   She was advised to call the office or go to ER if her condition worsens    Subjective:   Katrina Grant is a 52 y.o. female with PMH significant for: Past Medical History:  Diagnosis Date   ADD  (attention deficit disorder)    Anxiety    Cholesterol serum elevated    on medication in the past   Crohn's disease of colon with fistula [facility])    sees Dr. Marcella Dubs, [facility]   Depression    Fibromyalgia - managed by Dr. Kathi Ludwig, [facility] - takes Neurontin and Tramadol 11/20/2013   Frequent headaches    GERD (gastroesophageal reflux disease)    Osteoarthritis    knee, spine   Pneumonia    Syncope      Presenting today with: Chief Complaint  Patient presents with   Medical Management of Chronic Issues   Sinus Problem    Patient complains of sinus pressure, congestion and brown drainage x1 week, tried Sudafed and Nyquil with some relief     She clarifies and reports that her condition: Pt is here for medication refills for her ADHD. She reports that she has started taking care of her elderly mother, states that she was also let go from her job a few months ago as well. She reports that her anxiety is "off the charts", has been taking 2 tablets of the escitalopram for the past week or so.  Is also having pain in the heel of her foot, states that it hurts to walk on her heel, like there is a stone  under her foot.  She denies having any: Side effects          Objective/Observations  Physical Exam:  Polite and friendly Gen: NAD, resting comfortably Pulm: Normal work of breathing Neuro: Grossly normal, moves all extremities Psych: Normal affect and thought content Problem specific physical exam findings:    No images are attached to the encounter or orders placed in the encounter.    Results: No results found for any visits on 04/13/23.        Virtual Visit via Video   I connected with Katrina Grant on 04/13/23 at  1:00 PM EDT by a video enabled telemedicine application and verified that I am speaking with the correct person using two identifiers. The limitations of evaluation and management by telemedicine and the availability of in person appointments were  discussed. The patient expressed understanding and agreed to proceed.   Percentage of appointment time on video:  100% Patient location: Home Provider location: [place] [facility] Persons participating in the virtual visit: Myself and Patient

## 2023-04-17 NOTE — Unmapped (Signed)
[facility] Specialty and Home Delivery Pharmacy Refill Coordination Note    Specialty Medication(s) to be Shipped:   Inflammatory Disorders: Humira    Other medication(s) to be shipped: No additional medications requested for fill at this time     Jasmine Mitchell, DOB: [birth year]  Phone: [phone] (home)       All above HIPAA information was verified with patient.     Was a Nurse, learning disability used for this call? No    Completed refill call assessment today to schedule patient's medication shipment from the [facility] and [facility] Delivery Pharmacy  [phone]).  All relevant notes have been reviewed.     Specialty medication(s) and dose(s) confirmed: Regimen is correct and unchanged.   Changes to medications: Gyla reports starting the following medications: Claritin 10mg   Changes to insurance: No  New side effects reported not previously addressed with a pharmacist or physician: None reported  Questions for the pharmacist: No    Confirmed patient received a Conservation officer, historic buildings and a Surveyor, mining with first shipment. The patient will receive a drug information handout for each medication shipped and additional FDA Medication Guides as required.       DISEASE/MEDICATION-SPECIFIC INFORMATION        For patients on injectable medications: Patient currently has 0 doses left.  Next injection is scheduled for 04/21/23.    SPECIALTY MEDICATION ADHERENCE     Medication Adherence    Patient reported X missed doses in the last month: 0  Specialty Medication: HUMIRA(CF) PEN 40 mg/0.4 mL injection (adalimumab)  Patient is on additional specialty medications: No  Patient is on more than two specialty medications: No  Any gaps in refill history greater than 2 weeks in the last 3 months: no  Demonstrates understanding of importance of adherence: yes  Informant: patient  Reliability of informant: reliable  Provider-estimated medication adherence level: good  Patient is at risk for Non-Adherence: No  Reasons for non-adherence: no problems identified              Were doses missed due to medication being on hold? No    HUMIRA(CF) PEN 40 mg/0.4 mL injection (adalimumab)  : 0 days of medicine on hand       REFERRAL TO PHARMACIST     Referral to the pharmacist: Not needed      [facility]     Shipping address confirmed in Epic.       Delivery Scheduled: Yes, Expected medication delivery date: 04/19/23.     Medication will be delivered via UPS to the prescription address in Epic WAM.    [facility] Pharm[facility]  Specialty Technician

## 2023-04-17 NOTE — Unmapped (Signed)
The [facility] has made a second and final attempt to reach this patient to refill the following medication: Humira.      We have left voicemails on the following phone numbers: [phone], have sent a MyChart message, have sent a text message to the following phone numbers: [phone], and have sent a Mychart questionnaire..    Dates contacted: 10/17 & 10/28  Last scheduled delivery: 03/17/23 (28 day supply)    The patient may be at risk of non-compliance with this medication. The patient should call the [facility] at [phone]  Option 4, then Option 2: Dermatology, Gastroenterology, Rheumatology to refill medication.    Willette Pa   [facility] Specialty and Home Delivery Pharmacy Specialty Technician

## 2023-04-18 MED FILL — HUMIRA PEN CITRATE FREE 40 MG/0.4 ML: SUBCUTANEOUS | 28 days supply | Qty: 4 | Fill #1

## 2023-05-09 DIAGNOSIS — K5080 Crohn's disease of both small and large intestine without complications: Principal | ICD-10-CM

## 2023-05-09 MED ORDER — HUMIRA PEN CITRATE FREE 40 MG/0.4 ML
SUBCUTANEOUS | 3 refills | 28 days | Status: CP
Start: 2023-05-09 — End: ?
  Filled 2023-05-29: qty 4, 28d supply, fill #0

## 2023-05-09 NOTE — Unmapped (Signed)
Encounter for refill request:  Last clinic visit: 05/17/2022  Colonoscopy: 06/27/22  Appointments which have been scheduled for you      Aug 22, 2023 8:30 AM  (Arrive by 8:15 AM)  RETURN IBD with Zetta Bills, MD  [facility] GI MEDICINE [place] [facility] REG[facility]) [address]  [facility] [address]  [place] [address]  [phone]             Lab Results   Component Value Date    WBC 4.9 05/17/2022    RBC 4.31 05/17/2022    HGB 13.3 05/17/2022    HCT 39.4 05/17/2022    PLT 241 05/17/2022    ALT 18 05/17/2022    AST 20 05/17/2022    ALKPHOS 76 12/09/2021    CRP <4.0 12/09/2021    CREATININE 0.79 05/17/2022       Humira refills authorized x 4 month supply.

## 2023-05-25 NOTE — Unmapped (Signed)
[facility] Specialty and Home Delivery Pharmacy Refill Coordination Note    Specialty Medication(s) to be Shipped:   Inflammatory Disorders: Humira    Other medication(s) to be shipped: No additional medications requested for fill at this time     Jasmine Mitchell, DOB: 1971-04-05  Phone: [phone] (home)       All above HIPAA information was verified with patient.     Was a Nurse, learning disability used for this call? No    Completed refill call assessment today to schedule patient's medication shipment from the [facility] and Home Delivery Pharmacy  [phone]).  All relevant notes have been reviewed.     Specialty medication(s) and dose(s) confirmed: Regimen is correct and unchanged.   Changes to medications: [name] reports no changes at this time.  Changes to insurance: No  New side effects reported not previously addressed with a pharmacist or physician: None reported  Questions for the pharmacist: No    Confirmed patient received a Conservation officer, historic buildings and a Surveyor, mining with first shipment. The patient will receive a drug information handout for each medication shipped and additional FDA Medication Guides as required.       DISEASE/MEDICATION-SPECIFIC INFORMATION        For patients on injectable medications: Patient currently has 0 doses left.  Next injection is scheduled for 05/25/23.    SPECIALTY MEDICATION ADHERENCE     Medication Adherence    Patient reported X missed doses in the last month: 0  Specialty Medication: HUMIRA(CF) PEN 40 mg/0.4 mL injection (adalimumab)  Patient is on additional specialty medications: No  Patient is on more than two specialty medications: No  Any gaps in refill history greater than 2 weeks in the last 3 months: no  Demonstrates understanding of importance of adherence: yes  Informant: patient  Confirmed plan for next specialty medication refill: delivery by pharmacy  Refills needed for supportive medications: not needed          Refill Coordination    Has the Patients' Contact Information Changed: No  Is the Shipping Address Different: No         Were doses missed due to medication being on hold? No    HUMIRA(CF) PEN 40 /0.4   mg/ml: 0 doses of medicine on hand       REFERRAL TO PHARMACIST     Referral to the pharmacist: Not needed      [facility]     Shipping address confirmed in Epic.       Delivery Scheduled: Yes, Expected medication delivery date: 05/30/23.     Medication will be delivered via UPS to the prescription address in Epic WAM.    Kerby Less   [facility] Specialty and Home Delivery Pharmacy  Specialty Technician

## 2023-05-26 DIAGNOSIS — K5080 Crohn's disease of both small and large intestine without complications: Principal | ICD-10-CM

## 2023-07-04 DIAGNOSIS — K5080 Crohn's disease of both small and large intestine without complications: Principal | ICD-10-CM

## 2023-07-05 NOTE — Unmapped (Signed)
[facility] Clinical Assessment & Refill Coordination Note    PA referral inputted for new insurance, dose due 1/17 & tentatively scheduled delivery for Friday but pt is aware that PA will most likely not be approved in time and will need to adjust delivery    Jasmine Mitchell, DOB: November 21, 1970  Phone: [phone] (home)     All above HIPAA information was verified with patient.     Was a Nurse, learning disability used for this call? No    Specialty Medication(s):   Inflammatory Disorders: Humira     Current Outpatient Medications   Medication Sig Dispense Refill    acetaminophen (TYLENOL) 500 MG tablet Take 2 tablets (1,000 mg total) by mouth every four (4) hours as needed for pain.      buPROPion (WELLBUTRIN XL) 150 MG 24 hr tablet Take 1 tablet (150 mg total) by mouth daily.      desipramine (NORPRAMIN) 25 MG tablet TAKE 6 TABLETS (150 MG TOTAL) BY MOUTH DAILY. 540 tablet 2    dextroamphetamine-amphetamine (ADDERALL XR) 20 MG 24 hr capsule Take 1 capsule (20 mg total) by mouth daily.      dextroamphetamine-amphetamine (ADDERALL) 10 mg tablet TAKE 1 TABLET DAILY AFTER LUNCH.      escitalopram oxalate (LEXAPRO) 5 MG tablet Take 1 tablet (5 mg total) by mouth daily. TAKE 1 TABLET (5 MG TOTAL) BY MOUTH DAILY.      fluticasone (FLONASE) 50 mcg/actuation nasal spray 1 SPRAY BY EACH NARE ROUTE DAILY. AS DIRECTED (Patient taking differently: 1 spray by Each Nare route daily as needed. As directed) 16 mL 2    HUMIRA PEN CITRATE FREE 40 MG/0.4 ML Inject the contents of 1 pen (40 mg total) under the skin once a week. 4 each 3    MULTIVIT WITH CALCIUM,IRON,MIN (WOMEN'S DAILY MULTIVITAMIN ORAL) Take 1 tablet by mouth once daily.      ondansetron (ZOFRAN-ODT) 8 MG disintegrating tablet Take 1 tablet (8 mg total) by mouth every eight (8) hours as needed. (Patient not taking: Reported on 06/27/2022)      polyethylene glycol (CLEARLAX) 17 gram/dose powder Take as directed for split prep. 238 g 0    polyethylene glycol (CLEARLAX) 17 gram/dose powder Take as directed for split prep. 119 g 0    predniSONE (DELTASONE) 10 mg tablet pack Take by mouth daily. 6 day dose pack for hurt arm.      simethicone (MYLICON) 125 MG chewable tablet Take 2 tablets (250mg  total) with prep as directed. 4 tablet 0     No current facility-administered medications for this visit.        Changes to medications: Ersula reports no changes at this time.    Allergies   Allergen Reactions    Oxycodone (Bulk) Nausea And Vomiting     Patient tolerates acetaminophen well       Changes to allergies: No    SPECIALTY MEDICATION ADHERENCE     Humira 40  mg/0.38mL : 0 doses of medicine on hand      Medication Adherence    Patient reported X missed doses in the last month: 0  Specialty Medication: Humira 40mg /0.52mL -1q7d  Patient is on additional specialty medications: No  Informant: patient          Specialty medication(s) dose(s) confirmed: Regimen is correct and unchanged.     Are there any concerns with adherence? No    Adherence counseling provided? Not needed    CLINICAL MANAGEMENT AND INTERVENTION  Clinical Benefit Assessment:    Do you feel the medicine is effective or helping your condition? Yes    Clinical Benefit counseling provided? Not needed    Adverse Effects Assessment:    Are you experiencing any side effects? No    Are you experiencing difficulty administering your medicine? No    Quality of Life Assessment:    Quality of Life    Rheumatology  Oncology  Dermatology  Cystic Fibrosis          How many days over the past month did your Crohn's  keep you from your normal activities? For example, brushing your teeth or getting up in the morning. 0    Have you discussed this with your provider? Not needed    Acute Infection Status:    Acute infections noted within Epic:  No active infections  Patient reported infection: None    Therapy Appropriateness:    Is therapy appropriate based on current medication list, adverse reactions, adherence, clinical benefit and progress toward achieving therapeutic goals? Yes, therapy is appropriate and should be continued     DISEASE/MEDICATION-SPECIFIC INFORMATION      For patients on injectable medications: Patient currently has 0 doses left.  Next injection is scheduled for 1/17.    Chronic Inflammatory Diseases: Have you experienced any flares in the last month? No  Has this been reported to your provider? Not applicable    PATIENT SPECIFIC NEEDS     Does the patient have any physical, cognitive, or cultural barriers? No    Is the patient high risk? No    Did the patient require a clinical intervention? No    Does the patient require physician intervention or other additional services (i.e., nutrition, smoking cessation, social work)? No    SOCIAL DETERMINANTS OF HEALTH     At the [facility], we have learned that life circumstances - like trouble affording food, housing, utilities, or transportation can affect the health of many of our patients.   That is why we wanted to ask: are you currently experiencing any life circumstances that are negatively impacting your health and/or quality of life? No    Social Drivers of Health     Food Insecurity: No Food Insecurity (11/22/2021)    Received from [facility]    Hunger Vital Sign     Worried About Running Out of Food in the Last Year: Never true     Ran Out of Food in the Last Year: Never true   Internet Connectivity: Not on file   Housing/Utilities: Not on file   Tobacco Use: Low Risk  (06/30/2022)    Patient History     Smoking Tobacco Use: Never     Smokeless Tobacco Use: Never     Passive Exposure: Not on file   Transportation Needs: No Transportation Needs (11/22/2021)    Received from [facility] - Transportation     Lack of Transportation (Medical): No     Lack of Transportation (Non-Medical): No   Alcohol Use: Not At Risk (04/04/2018)    Received from [facility] visits prior to 08/20/2022.    AUDIT-C     Frequency of Alcohol Consumption: Never     Average Number of Drinks: Not on file     Frequency of Binge Drinking: Not on file   Interpersonal Safety: Not on file   Physical Activity: Unknown (11/22/2021)    Received from [facility]    Exercise  Vital Sign     Days of Exercise per Week: 0 days     Minutes of Exercise per Session: Not on file   Intimate Partner Violence: Not on file   Stress: No Stress Concern Present (11/22/2021)    Received from [facility] - Occupational Stress Questionnaire     Feeling of Stress : Not at all   Substance Use: Not on file (04/30/2023)   Social Connections: Unknown (11/22/2021)    Received from [facility]    Social Connection and Isolation Panel [NHANES]     Frequency of Communication with Friends and Family: Three times a week     Frequency of Social Gatherings with Friends and Family: Once a week     Attends Religious Services: Patient refused     Active Member of Clubs or Organizations: Patient refused     Attends Banker Meetings: Not on file     Marital Status: Married   Physicist, medical Strain: Low Risk  (11/22/2021)    Received from [facility]    Overall Financial Resource Strain (CARDIA)     Difficulty of Paying Living Expenses: Not very hard   Depression: Not at risk (02/24/2021)    Received from [facility] visits prior to 08/20/2022.    PHQ-2     SDOH PHQ2 SCORE: 0   Health Literacy: Not on file       Would you be willing to receive help with any of the needs that you have identified today? Not applicable       SHIPPING     Specialty Medication(s) to be Shipped:   Inflammatory Disorders: Humira    Other medication(s) to be shipped: No additional medications requested for fill at this time     Changes to insurance: Yes: PA referral inputted    Delivery Scheduled: Yes, Expected medication delivery date: 1/17.     Medication will be delivered via UPS to the confirmed prescription address in [place].    The patient will receive a drug information handout for each medication shipped and additional FDA Medication Guides as required.  Verified that patient has previously received a Conservation officer, historic buildings and a Surveyor, mining.    The patient or caregiver noted above participated in the development of this care plan and knows that they can request review of or adjustments to the care plan at any time.      All of the patient's questions and concerns have been addressed.    Teofilo Pod, PharmD   [facility] Specialty Pharmacist

## 2023-07-06 NOTE — Unmapped (Signed)
Jasmine Mitchell 's HUMIRA(CF) PEN 40 mg/0.4 mL injection (adalimumab) shipment will be delayed as a result of prior authorization being required by the patient's insurance.     I have reached out to the patient  at [phone]  and communicated the delay. We will call the patient back to reschedule the delivery upon resolution. We have not confirmed the new delivery date.

## 2023-07-11 DIAGNOSIS — K5080 Crohn's disease of both small and large intestine without complications: Principal | ICD-10-CM

## 2023-07-11 MED ORDER — ADALIMUMAB-RYVK 40 MG/0.4 ML SUBCUTANEOUS AUTO-INJECTOR KIT
SUBCUTANEOUS | 1 refills | 0.00 days | Status: CP
Start: 2023-07-11 — End: ?

## 2023-07-11 NOTE — Unmapped (Addendum)
Humira denied by insurance due to requiring a trial and failure of Simlandi first. Simlandi script routed to [facility] for auth and fill.     1/29- Received denial for Simlandi. Called [employer] P[employer] at [phone] and informed that pt has no coverage for brand name specialty medications, but there are also no adalimumab generic medications on her formulary. Instructed to send prescription to [facility]. [facility] will get a rejection on the [facility] and then patient will have to call [employer] for next steps. Rep would not discuss with providers office what next steps were. Script routed and will call to follow up.     [facility]: [phone]

## 2023-07-11 NOTE — Unmapped (Signed)
Jasmine Mitchell 's HUMIRA(CF) PEN 40 mg/0.4 mL injection (adalimumab) shipment will be canceled as a result of prior authorization being required by the patient's insurance. (PA denied)    I have reached out to the patient  at [phone]  and communicated the delay. We will not reschedule the medication and have removed this/these medication(s) from the work request.  We have canceled this work request.

## 2023-07-19 MED ORDER — ADALIMUMAB-RYVK 40 MG/0.4 ML SUBCUTANEOUS AUTO-INJECTOR KIT
SUBCUTANEOUS | 3 refills | 0.00 days | Status: CP
Start: 2023-07-19 — End: 2023-07-19

## 2023-07-19 NOTE — Unmapped (Signed)
Called [employer] [phone]) after receiving denial for Humira coverage due to not failing Simlandi but then also denying Simlandi coverage. Patient does not have specialty medication coverage through [facility] and must call [facility] [phone]) to set up funding for specialty med. They will help find funding whether through [facility] or international acquisition. Spoke with Gearldine Bienenstock on the phone and let her know of urgent situation as patient was due for dose 1/17 and she is on Humira 40 mg weekly. She said she would work on ASAP and call patient to enroll and set up delivery of Humira as soon as able. She said if patient stable on Humira then no need to switch formulation and send Humira Rx to fax [phone]. Rx faxed. Likely will acquire medication internationally for patient and will be 100% covered, with $0 copay for patient. Gearldine Bienenstock will update me along the process and if any further help or clarification is needed from GI clinic.    Edsel Petrin, PharmD, BCPS, CPP  Clinical Pharmacist Practitioner, GI/IBD  [phone]

## 2023-07-24 ENCOUNTER — Emergency Department (HOSPITAL_COMMUNITY)
Admission: EM | Admit: 2023-07-24 | Discharge: 2023-07-25 | Discharge status: Against Medical Advice | Payer: No Typology Code available for payment source | Attending: Student | Admitting: Student

## 2023-07-24 ENCOUNTER — Other Ambulatory Visit: Payer: Self-pay

## 2023-07-24 ENCOUNTER — Emergency Department (HOSPITAL_COMMUNITY): Payer: No Typology Code available for payment source

## 2023-07-24 DIAGNOSIS — Y92410 Unspecified street and highway as the place of occurrence of the external cause: Secondary | ICD-10-CM | POA: Insufficient documentation

## 2023-07-24 DIAGNOSIS — Z5321 Procedure and treatment not carried out due to patient leaving prior to being seen by health care provider: Secondary | ICD-10-CM | POA: Insufficient documentation

## 2023-07-24 DIAGNOSIS — R079 Chest pain, unspecified: Secondary | ICD-10-CM | POA: Diagnosis present

## 2023-07-24 LAB — BASIC METABOLIC PANEL
Anion gap: 13 (ref 5–15)
BUN: 22 mg/dL — ABNORMAL HIGH (ref 6–20)
CO2: 20 mmol/L — ABNORMAL LOW (ref 22–32)
Calcium: 9.1 mg/dL (ref 8.9–10.3)
Chloride: 107 mmol/L (ref 98–111)
Creatinine, Ser: 1.15 mg/dL — ABNORMAL HIGH (ref 0.44–1.00)
GFR, Estimated: 57 mL/min — ABNORMAL LOW (ref >60)
Glucose, Bld: 94 mg/dL (ref 70–99)
Potassium: 3.6 mmol/L (ref 3.5–5.1)
Sodium: 140 mmol/L (ref 135–145)

## 2023-07-24 LAB — CBC
HCT: 43.1 % (ref 36.0–46.0)
Hemoglobin: 13.9 g/dL (ref 12.0–15.0)
MCH: 30.6 pg (ref 26.0–34.0)
MCHC: 32.3 g/dL (ref 30.0–36.0)
MCV: 94.9 fL (ref 80.0–100.0)
Platelets: 224 10*3/uL (ref 150–400)
RBC: 4.54 MIL/uL (ref 3.87–5.11)
RDW: 12.9 % (ref 11.5–15.5)
WBC: 12.4 10*3/uL — ABNORMAL HIGH (ref 4.0–10.5)
nRBC: 0 % (ref 0.0–0.2)

## 2023-07-24 LAB — TROPONIN I (HIGH SENSITIVITY): Troponin I (High Sensitivity): 3 ng/L (ref <18)
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
## 2023-07-25 NOTE — ED Notes (Signed)
Pt wanting to leave, stickers collected.

## 2023-07-28 ENCOUNTER — Ambulatory Visit: Payer: Self-pay | Admitting: Family Medicine

## 2023-08-01 NOTE — Unmapped (Signed)
Specialty Medication(s): Humira    Ms.Hauter has been dis-enrolled from the [employer] [facility] specialty pharmacy services due to a pharmacy change. The patient is now filling at [facility] .    Additional information provided to the patient: n/a    Teofilo Pod, PharmD  [facility] Specialty Pharmacist
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
## 2023-08-16 ENCOUNTER — Other Ambulatory Visit (HOSPITAL_COMMUNITY)
Admission: RE | Admit: 2023-08-16 | Discharge: 2023-08-16 | Disposition: A | Discharge status: Home / Self Care | Payer: Managed Care, Other (non HMO) | Source: Ambulatory Visit | Attending: Obstetrics and Gynecology | Admitting: Obstetrics and Gynecology

## 2023-08-16 ENCOUNTER — Encounter: Payer: Self-pay | Admitting: Obstetrics and Gynecology

## 2023-08-16 ENCOUNTER — Ambulatory Visit: Payer: Managed Care, Other (non HMO) | Admitting: Obstetrics and Gynecology

## 2023-08-16 VITALS — BP 112/70 | HR 56 | Wt 223.0 lb

## 2023-08-16 DIAGNOSIS — Z0000 Encounter for general adult medical examination without abnormal findings: Secondary | ICD-10-CM | POA: Diagnosis present

## 2023-08-16 DIAGNOSIS — N3000 Acute cystitis without hematuria: Secondary | ICD-10-CM

## 2023-08-16 DIAGNOSIS — Z1231 Encounter for screening mammogram for malignant neoplasm of breast: Secondary | ICD-10-CM | POA: Diagnosis not present

## 2023-08-16 DIAGNOSIS — Z01419 Encounter for gynecological examination (general) (routine) without abnormal findings: Secondary | ICD-10-CM | POA: Diagnosis not present

## 2023-08-16 DIAGNOSIS — Z113 Encounter for screening for infections with a predominantly sexual mode of transmission: Secondary | ICD-10-CM | POA: Diagnosis not present

## 2023-08-16 LAB — POCT URINALYSIS DIPSTICK
Bilirubin, UA: NEGATIVE
Glucose, UA: NEGATIVE
Ketones, UA: NEGATIVE
Nitrite, UA: POSITIVE
Protein, UA: POSITIVE — AB
Spec Grav, UA: 1.015 (ref 1.010–1.025)
Urobilinogen, UA: 0.2 U/dL
pH, UA: 6.5 (ref 5.0–8.0)

## 2023-08-16 MED ORDER — M-NATAL PLUS 27-1 MG PO TABS: ORAL_TABLET | ORAL | 4 refills | Status: DC

## 2023-08-16 MED ORDER — SULFAMETHOXAZOLE-TRIMETHOPRIM 800-160 MG PO TABS: 1.0000 | ORAL_TABLET | Freq: Two times a day (BID) | ORAL | 1 refills | Status: DC

## 2023-08-16 MED ORDER — FLUCONAZOLE 150 MG PO TABS: 150.0000 mg | ORAL_TABLET | Freq: Once | ORAL | 1 refills | Status: AC
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
## 2023-08-17 LAB — CERVICOVAGINAL ANCILLARY ONLY
Bacterial Vaginitis (gardnerella): NEGATIVE
Candida Glabrata: NEGATIVE
Candida Vaginitis: NEGATIVE
Chlamydia: NEGATIVE
Comment: NEGATIVE
Comment: NEGATIVE
Comment: NEGATIVE
Comment: NEGATIVE
Comment: NEGATIVE
Comment: NORMAL
Neisseria Gonorrhea: NEGATIVE
Trichomonas: NEGATIVE

## 2023-08-17 LAB — HEPATITIS B SURFACE ANTIGEN: Hepatitis B Surface Ag: NEGATIVE

## 2023-08-17 LAB — HIV ANTIBODY (ROUTINE TESTING W REFLEX): HIV Screen 4th Generation wRfx: NONREACTIVE

## 2023-08-17 LAB — HEPATITIS C ANTIBODY: Hep C Virus Ab: NONREACTIVE

## 2023-08-17 LAB — RPR: RPR Ser Ql: NONREACTIVE

## 2023-08-18 MED ORDER — ADALIMUMAB-RYVK 40 MG/0.4 ML SUBCUTANEOUS AUTO-INJECTOR KIT
SUBCUTANEOUS | 3 refills | 0.00 days | Status: CP
Start: 2023-08-18 — End: ?
  Filled 2023-08-23: qty 4, 28d supply, fill #0

## 2023-08-18 NOTE — Unmapped (Signed)
 Received call from [facility] that they are not able to provide assistance for Humira for this patient, as the insurance formulary has changed. Called [employer] at [phone] and was told Humira was denied for needing to try and fail Simlandi, but also Simlandi was denied. This was escalated to a manager and they have placed an override for Simlandi to be filled at [facility]. Script routed and messaged pharmacy to see if paid claim is able to be obtained.     [place] [facility] able to get a paid claim with $0 copay, they will reach out to the patient to schedule.

## 2023-08-18 NOTE — Unmapped (Signed)
 [facility] Specialty Medication Onboarding    Specialty Medication: Simlandi  Prior Authorization: Not Required   Financial Assistance: Yes - copay card approved as secondary   Final Copay/Day Supply: $0 / 28 days    Insurance Restrictions: None     Notes to Pharmacist: Please let inventory know to order - do not defer do to OR placed by ins   Credit Card on File: not applicable  Start Date on Rx:      The triage team has completed the benefits investigation and has determined that the patient is able to fill this medication at [facility]. Please contact the patient to complete the onboarding or follow up with the prescribing physician as needed.

## 2023-08-18 NOTE — Unmapped (Signed)
 [facility]    Patient Onboarding/Medication Counseling    Ms.Frayne is a 53 y.o. female with crohn's who I am counseling today on initiation of therapy.  I am speaking to the patient.    Was a Nurse, learning disability used for this call? No    Verified patient's date of birth / HIPAA.    Specialty medication(s) to be sent: Inflammatory Disorders: Simlandi      Non-specialty medications/supplies to be sent: n/a      Medications not needed at this time: n/a         Simlandi (adalimumab-ryvk)    This patient is receiving Simlandi in place of Humira due to insurance restrictions. They have been counseled on medication administration, missed dose instructions, goals of therapy, side effects and monitoring parameters, warnings and precautions, drug/food interactions, and storage, handling precautions, and disposal. We have previously onboarded this patient to our pharmacy and thus did not discuss pharmacy services or patient specific needs again. This onboarding note is for documentation purposes only.      Medication & Administration     Dosage: Crohn's Disease: Inject 40mg  under the skin every 14 days (continuation)    Lab tests required prior to treatment initiation:  Tuberculosis: Tuberculosis screening not documented in the patient's chart but medication prescriber has indicated they are aware and wishing to initiate treatment at this time.  Hepatitis B: Hepatitis B serology studies are not documented in the patient's chart but medication prescriber has indicated they are aware and wishing to initiate treatment at this time.    Administration:     Prefilled auto-injector pen  1. Gather all supplies needed for injection on a clean, flat working surface: medication pen removed from packaging, alcohol swab, sharps container, etc.  2. Look at the medication label - look for correct medication, correct dose, and check the expiration date  3. Look at the medication - the liquid visible in the window on the side of the pen device should appear clear and colorless  4. Lay the auto-injector pen on a flat surface and allow it to warm up to room temperature for at least 30-45 minutes  5. Select injection site - you can use the front of your thigh or your belly (but not the area 2 inches around your belly button); if someone else is giving you the injection you can also use your upper arm in the skin covering your triceps muscle  6. Prepare injection site - wash your hands and clean the skin at the injection site with an alcohol swab and let it air dry, do not touch the injection site again before the injection  7. Pull the 2 safety caps straight off - gray/white to uncover the needle cover and the plum cap to uncover the plum activator button, do not remove until immediately prior to injection and do not touch the white needle cover  8. Gently squeeze the area of cleaned skin and hold it firmly to create a firm surface at the selected injection site  9. Put the white needle cover against your skin at the injection site at a 90 degree angle, hold the pen such that you can see the clear medication window  10. Press down and hold the pen firmly against your skin, press the plum activator button to initiate the injection, there will be a click when the injection starts  11. Continue to hold the pen firmly against your skin for about 10-15 seconds - the window  will start to turn solid yellow  12. To verify the injection is complete after 10-15 seconds, look and ensure the window is solid yellow and then pull the pen away from your skin  13. Dispose of the used auto-injector pen immediately in your sharps disposal container the needle will be covered automatically  14. If you see any blood at the injection site, press a cotton ball or gauze on the site and maintain pressure until the bleeding stops, do not rub the injection site    Adherence/Missed dose instructions:  If your injection is given more than 3 days after your scheduled injection date - consult your pharmacist for additional instructions on how to adjust your dosing schedule.    Goals of Therapy     - Achieve remission of symptoms  - Maintain remission of symptoms  - Minimize long-term systemic glucocorticoid use  - Prevent need for surgical procedures  - Maintenance of effective psychosocial functioning    Side Effects & Monitoring Parameters     Injection site reaction (redness, irritation, inflammation localized to the site of administration)  Signs of a common cold - minor sore throat, runny or stuffy nose, etc.  Upset stomach  Headache    The following side effects should be reported to the provider:  Signs of a hypersensitivity reaction - rash; hives; itching; red, swollen, blistered, or peeling skin; wheezing; tightness in the chest or throat; difficulty breathing, swallowing, or talking; swelling of the mouth, face, lips, tongue, or throat; etc.  Reduced immune function - report signs of infection such as fever; chills; body aches; very bad sore throat; ear or sinus pain; cough; more sputum or change in color of sputum; pain with passing urine; wound that will not heal, etc.  Also at a slightly higher risk of some malignancies (mainly skin and blood cancers) due to this reduced immune function.  In the case of signs of infection - the patient should hold the next dose of Humira?? and call your primary care provider to ensure adequate medical care.  Treatment may be resumed when infection is treated and patient is asymptomatic.  Changes in skin - a new growth or lump that forms; changes in shape, size, or color of a previous mole or marking  Signs of unexplained bruising or bleeding - throwing up blood or emesis that looks like coffee grounds; black, tarry, or bloody stool; etc.  Signs of new or worsening heart failure - shortness of breath; sudden weight gain; heartbeat that is not normal; swelling in the arms or legs that is new or worse      Contraindications, Warnings, & Precautions     Have your bloodwork checked as you have been told by your prescriber  Talk with your doctor if you are pregnant, planning to become pregnant, or breastfeeding  Discuss the possible need for holding your dose(s) of Humira?? when a planned procedure is scheduled with the prescriber as it may delay healing/recovery timeline       Drug/Food Interactions     Medication list reviewed in Epic. The patient was instructed to inform the care team before taking any new medications or supplements. No drug interactions identified.   Talk with you prescriber or pharmacist before receiving any live vaccinations while taking this medication and after you stop taking it    Storage, Handling Precautions, & Disposal     Store this medication in the refrigerator.  Do not freeze  If needed, you may store at room temperature  for up to 14 days  Store in [employer], protected from light  Do not shake  Dispose of used syringes/pens in a sharps disposal container          Current Medications (including OTC/herbals), Comorbidities and Allergies     Current Outpatient Medications   Medication Sig Dispense Refill    acetaminophen (TYLENOL) 500 MG tablet Take 2 tablets (1,000 mg total) by mouth every four (4) hours as needed for pain.      adalimumab-ryvk [facility], AUTOINJECTOR) 40 mg/0.4 mL atIK Inject the contents of 1 pen (40 mg) under the skin every seven (7) days. 4 each 3    buPROPion (WELLBUTRIN XL) 150 MG 24 hr tablet Take 1 tablet (150 mg total) by mouth daily.      desipramine (NORPRAMIN) 25 MG tablet TAKE 6 TABLETS (150 MG TOTAL) BY MOUTH DAILY. 540 tablet 2    dextroamphetamine-amphetamine (ADDERALL XR) 20 MG 24 hr capsule Take 1 capsule (20 mg total) by mouth daily.      dextroamphetamine-amphetamine (ADDERALL) 10 mg tablet TAKE 1 TABLET DAILY AFTER LUNCH.      escitalopram oxalate (LEXAPRO) 5 MG tablet Take 1 tablet (5 mg total) by mouth daily. TAKE 1 TABLET (5 MG TOTAL) BY MOUTH DAILY. fluticasone (FLONASE) 50 mcg/actuation nasal spray 1 SPRAY BY EACH NARE ROUTE DAILY. AS DIRECTED (Patient taking differently: 1 spray by Each Nare route daily as needed. As directed) 16 mL 2    HUMIRA PEN CITRATE FREE 40 MG/0.4 ML Inject 0.4 mL (40 mg total) under the skin every seven (7) days. 4 each 11    MULTIVIT WITH CALCIUM,IRON,MIN (WOMEN'S DAILY MULTIVITAMIN ORAL) Take 1 tablet by mouth once daily.      ondansetron (ZOFRAN-ODT) 8 MG disintegrating tablet Take 1 tablet (8 mg total) by mouth every eight (8) hours as needed. (Patient not taking: Reported on 06/27/2022)      polyethylene glycol (CLEARLAX) 17 gram/dose powder Take as directed for split prep. 238 g 0    polyethylene glycol (CLEARLAX) 17 gram/dose powder Take as directed for split prep. 119 g 0    predniSONE (DELTASONE) 10 mg tablet pack Take by mouth daily. 6 day dose pack for hurt arm.      simethicone (MYLICON) 125 MG chewable tablet Take 2 tablets (250mg  total) with prep as directed. 4 tablet 0     No current facility-administered medications for this visit.       Allergies   Allergen Reactions    Oxycodone (Bulk) Nausea And Vomiting     Patient tolerates acetaminophen well       Patient Active Problem List   Diagnosis    Crohn's disease (CMS-HCC)    Fever    Hematuria    Cough    Crohn's disease with complication (CMS-HCC)       Medication list has been reviewed and updated in Epic: Yes    Allergies have been reviewed and updated in Epic: Yes    Appropriateness of Therapy     Acute infections noted within Epic:  No active infections  Patient reported infection: None    Is the medication and dose appropriate based on diagnosis, medication list, comorbidities, allergies, medical history, patient???s ability to self-administer the medication, and therapeutic goals? Yes    Prescription has been clinically reviewed: Yes      Baseline Quality of Life Assessment      How many days over the past month did your crohn's  keep you from your  normal activities? For example, brushing your teeth or getting up in the morning. Patient declined to answer    Financial Information     Medication Assistance provided: Prior Authorization and Copay Assistance    Anticipated copay of $0 reviewed with patient. Verified delivery address.    Delivery Information     Scheduled delivery date: 3/6    Expected start date: 3/7      Medication will be delivered via UPS to the prescription address in [facility].  This shipment will not require a signature.      Explained the services we provide at [facility] and that each month we would call to set up refills.  Stressed importance of returning phone calls so that we could ensure they receive their medications in time each month.  Informed patient that we should be setting up refills 7-10 days prior to when they will run out of medication.  A pharmacist will reach out to perform a clinical assessment periodically.  Informed patient that a welcome packet, containing information about our pharmacy and other support services, a Notice of Privacy Practices, and a drug information handout will be sent.      The patient or caregiver noted above participated in the development of this care plan and knows that they can request review of or adjustments to the care plan at any time.      Patient or caregiver verbalized understanding of the above information as well as how to contact the pharmacy at [phone] option 4 with any questions/concerns.  The pharmacy is open Monday through Friday 8:30am-4:30pm.  A pharmacist is available 24/7 via pager to answer any clinical questions they may have.    Patient Specific Needs     Does the patient have any physical, cognitive, or cultural barriers? No    Does the patient have adequate living arrangements? (i.e. the ability to store and take their medication appropriately) Yes    Did you identify any home environmental safety or security hazards? No    Patient prefers to have medications discussed with  Patient     Is the patient or caregiver able to read and understand education materials at a high school level or above? Yes    Patient's primary language is  English     Is the patient high risk? No    Does the patient have an additional or emergency contact listed in their chart? Yes    SOCIAL DETERMINANTS OF HEALTH     At the [facility], we have learned that life circumstances - like trouble affording food, housing, utilities, or transportation can affect the health of many of our patients.   That is why we wanted to ask: are you currently experiencing any life circumstances that are negatively impacting your health and/or quality of life? No    Social Drivers of Health     Food Insecurity: No Food Insecurity (11/22/2021)    Received from [facility]    Hunger Vital Sign     Worried About Running Out of Food in the Last Year: Never true     Ran Out of Food in the Last Year: Never true   Internet Connectivity: Not on file   Housing/Utilities: Not on file   Tobacco Use: Low Risk  (06/30/2022)    Patient History     Smoking Tobacco Use: Never     Smokeless Tobacco Use: Never     Passive Exposure: Not on file  Transportation Needs: No Transportation Needs (11/22/2021)    Received from [facility] - Transportation     Lack of Transportation (Medical): No     Lack of Transportation (Non-Medical): No   Alcohol Use: Not At Risk (04/04/2018)    Received from [facility] visits prior to 08/20/2022.    AUDIT-C     Frequency of Alcohol Consumption: Never     Average Number of Drinks: Not on file     Frequency of Binge Drinking: Not on file   Interpersonal Safety: Not on file   Physical Activity: Unknown (11/22/2021)    Received from [facility]    Exercise Vital Sign     Days of Exercise per Week: 0 days     Minutes of Exercise per Session: Not on file   Intimate Partner Violence: Not on file   Stress: No Stress Concern Present (11/22/2021)    Received from [facility] - Occupational Stress Questionnaire     Feeling of Stress : Not at all   Substance Use: Not on file (04/30/2023)   Social Connections: Unknown (11/22/2021)    Received from [facility]    Social Connection and Isolation Panel [NHANES]     Frequency of Communication with Friends and Family: Three times a week     Frequency of Social Gatherings with Friends and Family: Once a week     Attends Religious Services: Patient refused     Active Member of Clubs or Organizations: Patient refused     Attends Banker Meetings: Not on file     Marital Status: Married   Physicist, medical Strain: Low Risk  (11/22/2021)    Received from [facility]    Overall Financial Resource Strain (CARDIA)     Difficulty of Paying Living Expenses: Not very hard   Depression: Not at risk (02/24/2021)    Received from [facility] visits prior to 08/20/2022.    PHQ-2     SDOH PHQ2 SCORE: 0   Health Literacy: Not on file       Would you be willing to receive help with any of the needs that you have identified today? Not applicable       Teofilo Pod, PharmD  [facility] Specialty Pharmacist

## 2023-08-20 ENCOUNTER — Other Ambulatory Visit: Payer: Self-pay | Admitting: Obstetrics and Gynecology

## 2023-08-20 LAB — URINE CULTURE

## 2023-08-20 MED ORDER — CEPHALEXIN 500 MG PO CAPS: 500.0000 mg | ORAL_CAPSULE | Freq: Two times a day (BID) | ORAL | 0 refills | Status: AC

## 2023-08-20 NOTE — Unmapped (Addendum)
 [facility]   FOLLOWUP NOTE - INFLAMMATORY BOWEL DISEASE  08/27/2023    Demographics:  Jasmine Mitchell is a 53 y.o. year old female    Diagnosis:  Crohn's Disease  Disease onset (yr): 28  Age at onset:   < 9 yr old (A1)  Location:  Ileocolonic (L3) (mostly colonic)  Behavior:  Perianal (P), Stricturing (B2)  Current Tight Control Scenario:   Maintenance = Biologic          HPI / NOTE :     Interval Events:   1.  Last seen by me Nov 2023.  At that time, continued Humira weekly, continued Desipramine from 125mg  and Levsin SL PRN.    2.  Colonoscopy Jan 2024 - no active disease  3.  Missed appt with me June 2024  4.  No recent labs since fall 2023    HPI:  Off Humira for a little over a month. This was due to losing her job (and insurance) and having to switch to [employer] which covers less. She is approved for a biosimilar now but hasn't started it. Since being off therapy she is feeling worse. Fatigue, more frequent bowel movements (nocturnal/after dinner which is abnormal, interfering with her sleep), 2-4x per day, taking imodium almost every day. No blood in stool, no melena. No recurrence of perianal disease. No skin rash, mouth sores, eye issues. Joint pains, but those are chronic.    Actually thinks IBS symptoms are better. Is not taking desipramine. Not sure how much it was helping. Thinks wellbutrin and lexapro regimen is working well enough. Life stressors including aging parents, now responsible for caring for her 98yo mother, car accident. Recurrent UTIs (currently on bactrim).    Abdominal pain (0-10): none  BM a day: ~2-4x/day  Consistency: formed  % of stools have blood: 0%  Nocturnal BM: 1-2  Urgency:  mild - only w/ IBS flare  Weight change over last 6 mo: stable  Smoking:  no  NSAIDS: avoids    Review of Systems:   Review of systems positive for: negative except as above.   Otherwise, the balance of 10 systems is negative.          IBD HISTORY:     Brief IBD Disease Course:    - 64 - dx with Crohn's dz ~age 73, sx of diarrhea, abd pain, rectal pain and bloody stools.  Treated with sulfasalazine, Asacol, Pentasa without benefit.  Got courses of steroids and antibiotics on and off.  Tried on but had nausea.   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    - 2004 - Remicade x 9 months. Stopped in 2005 due to 2ndary loss of response.   - 2005 - developed a pyoderma lesion.   - 2010 - Re-established care with Dr. Marcella Dubs.  Concern for internal fistulizing disease.  Was on Cimzia. Had surgery for perianal disease.   - 2011 - Started on Humira.   - 2012 - active perianal disease with a large abscess.  Required EUA, drainage, setons ([place]).   - 2013 - April, dx with pelvic floor dyssynergia and anal sphincter weakness, underwent biofeedback x 2 courses ([place] [facility] [place]).   - 2014 - on - doing well on Humira and long term Cipro.  Having on and off bowel symptoms symptoms which have been thought to be related to intermittent IBS symptoms.   - 2023 - Remains on Humira, uptitrating desipramine and levsin PRN for IBS symptoms.  Endoscopy:      - Colonoscopy 06/17/2003: patch inflammation and ulcers throughout the colon.    - Colonoscopy 01/2009:  Active crohn's at IC valve. Pseudopolyps.  Retraction of IC valve. ?Rectosigmoid fistula tract.  Otherwise normal colon.   - Colonoscopy 10/08/2009:  Perianal fistula, pseudopolyps.  Few aphthae in TI.   - Colonoscopy 07/29/2010:  Poor colon prep, pseudopolyps, stricture at IC valve.  Few ulcers at IC valve. Rectal stricture. Ileal crohn's disease.   - Colonoscopy 05/05/2011:  Pseduopolyps throughout.  4mm cecal polyp. Large fistula in sigmoid colon. Normal TI.   - Colonoscopy 09/06/2012:  Healed scars from prior fistulotomies on perianal exam. Scar in ascending colon. No active Crohn's.   - Colonoscopy 10/24/13:  Pseudopolyps in rectosigmoid, otherwise normal colon and TI.   - Colonoscopy 10/01/14:  Pseudopolyps in rectum, transverse and ascending colon.  Otherwise normal colon.  Granular TI.  Fixed and open IC valve (not stenotic).   - Colonoscopy 08/12/16:  Mild anal canal stenosis on exam, otherwise normal colon and ileum.     PATH = chronic quiescent colitis (left and right colon) no dysplasia  - Colonoscopy 01/28/2019 - mild anal stricture (dilated to 20mm via [place]), otherwise normal colon and ileum with no active inflammation  - Colonoscopy 06/27/2022 - rectal stricture on exam, dilation to 20mm. One 4mm polyp in the sigmoid colon. Internal hemorrhoids. Otherwise normal colon with no active Crohn's disease.    PATH:  Normal colonic mucosa (left and right colon).      Imaging:    - CT A/P 10/24/2013: mild colonic wall thickening at splenic/descending colon, no other bowel inflammation or stranding.  *Patchy groundglass opacity in left lung base.     Prior IBD medications (type, dose, duration, response):  x 5-ASAs - Sulfasalazine, Asacol, Pentasa  x Oral corticosteroids - Prednisone  []  Intravenous corticosteroids  x Antibiotics - Flagyl - nausea.  Cipro - tolerates well and it helps.   x Thiopurines - - nausea.    TPMT - normal 07/25/17  x Methotrexate - 2005.   x Anti-TNF therapies - Remicade 2004 - 2005 (~6 doses).  Had gradual loss of response so stopped. Tried on Remicade again in 2009 with some effect, increased to 10mg /kg q4 weeks. Cimzia - started 09/2008.  Humira 2014 - dose increased to 80mg  q2 weeks.  04/2016 - humira level 5.6, Ab negative.   []  Cyclosporine  []  Clinical trial medication  []  Other (Please specify):    Extraintestinal manifestations:   -joint pains affecting: n  -eye: n  -skin: n  -oral ulcers :  n  -blood clots: n  -PSC: n  -other: n          Past Medical History:   Past medical history:   Past Medical History:   Diagnosis Date    Acid reflux     Arthritis     Crohn disease (CMS-HCC)     Fibromyalgia     UTI (urinary tract infection)      Past surgical history:   Past Surgical History:   Procedure Laterality Date FRACTURE SURGERY      left wrist    GASTROCUTANEOUS FISTULA CLOSURE      ORTHOPEDIC SURGERY      PR COLONOSCOPY FLX DX W/COLLJ SPEC WHEN PFRMD N/A 10/24/2013    Procedure: COLONOSCOPY, FLEXIBLE, PROXIMAL TO SPLENIC FLEXURE; DIAGNOSTIC, W/WO COLLECTION SPECIMEN BY BRUSH OR WASH;  Surgeon: Theadore Nan, MD;  [facility] [address];  Service:  Gastroenterology    PR COLONOSCOPY W/BIOPSY SINGLE/MULTIPLE N/A 10/01/2014    Procedure: COLONOSCOPY, FLEXIBLE, PROXIMAL TO SPLENIC FLEXURE; WITH BIOPSY, SINGLE OR MULTIPLE;  Surgeon: Teodoro Spray, MD;  Location: [facility];  Service: Gastroenterology    PR COLONOSCOPY W/BIOPSY SINGLE/MULTIPLE N/A 08/12/2016    Procedure: COLONOSCOPY, FLEXIBLE, PROXIMAL TO SPLENIC FLEXURE; WITH BIOPSY, SINGLE OR MULTIPLE;  Surgeon: Zetta Bills, MD;  Location: [facility];  Service: Gastroenterology    PR COLONOSCOPY W/BIOPSY SINGLE/MULTIPLE Left 01/28/2019    Procedure: COLONOSCOPY, FLEXIBLE, PROXIMAL TO SPLENIC FLEXURE; WITH BIOPSY, SINGLE OR MULTIPLE;  Surgeon: Zetta Bills, MD;  Location: [facility];  Service: Gastroenterology    PR COLONOSCOPY W/BIOPSY SINGLE/MULTIPLE Left 06/27/2022    Procedure: COLONOSCOPY, FLEXIBLE, PROXIMAL TO SPLENIC FLEXURE; WITH BIOPSY, SINGLE OR MULTIPLE;  Surgeon: Zetta Bills, MD;  Location: [facility];  Service: Gastroenterology    PR COLSC FLX W/RMVL OF TUMOR POLYP LESION SNARE TQ Left 01/28/2019    Procedure: COLONOSCOPY FLEX; W/REMOV TUMOR/LES BY SNARE;  Surgeon: Zetta Bills, MD;  Location: [facility];  Service: Gastroenterology    PR COLSC FLX W/RMVL OF TUMOR POLYP LESION SNARE TQ N/A 06/27/2022    Procedure: COLONOSCOPY FLEX; W/REMOV TUMOR/LES BY SNARE;  Surgeon: Zetta Bills, MD;  Location: [facility];  Service: Gastroenterology    PR DILATION RECTAL STRICTURE W ANEST Left 06/27/2022    Procedure: DILATION OF RECTAL STRICTURE (SEPARATE PROCEDURE) UNDER ANESTHESIA OTHER THAN LOCAL;  Surgeon: Zetta Bills, MD;  Location: [facility];  Service: Gastroenterology    PR SURG DIAGNOSTIC EXAM, ANORECTAL N/A 12/23/2016    Procedure: ANORECTAL EXAM, SURGICAL, REQUIRING ANESTHESIA (GENERAL, SPINAL, OR EPIDURAL), DIAGNOSTIC;  Surgeon: Mickle Asper, MD;  Location: MAIN OR [place];  Service: Gastrointestinal    PR UPPER GI ENDOSCOPY,DIAGNOSIS N/A 08/12/2016    Procedure: UGI ENDO, INCLUDE ESOPHAGUS, STOMACH, & DUODENUM &/OR JEJUNUM; DX W/WO COLLECTION SPECIMN, BY BRUSH OR WASH;  Surgeon: Zetta Bills, MD;  Location: [facility];  Service: Gastroenterology    TUBAL LIGATION       Family history:   Family History   Problem Relation Age of Onset    Cancer Sister     Cancer Paternal Grandmother     Colorectal Cancer Neg Hx      Social history:   Social History     Socioeconomic History    Marital status: Married     Spouse name: None    Number of children: None    Years of education: None    Highest education level: None   Tobacco Use    Smoking status: Never    Smokeless tobacco: Never   Vaping Use    Vaping status: Never Used   Substance and Sexual Activity    Alcohol use: Yes     Alcohol/week: 0.0 standard drinks of alcohol     Comment: socially     Drug use: No     Social Drivers of Psychologist, prison and probation services Strain: Low Risk  (11/22/2021)    Received from [facility], [facility]    Overall Financial Resource Strain (CARDIA)     Difficulty of Paying Living Expenses: Not very hard   Food Insecurity: No Food Insecurity (11/22/2021)    Received from [facility], [facility]    Hunger Vital Sign     Worried About Running Out of Food in the Last Year: Never true  Ran Out of Food in the Last Year: Never true   Transportation Needs: No Transportation Needs (11/22/2021)    Received from [facility], [facility]    [facility] - Transportation     Lack of Transportation (Medical): No     Lack of Transportation (Non-Medical): No Physical Activity: Unknown (11/22/2021)    Received from [facility], [facility]    Exercise Vital Sign     Days of Exercise per Week: 0 days   Stress: No Stress Concern Present (11/22/2021)    Received from [facility], [facility]    [facility] of Occupational Health - Occupational Stress Questionnaire     Feeling of Stress : Not at all   Social Connections: Unknown (11/22/2021)    Received from [facility], [facility]    Social Connection and Isolation Panel [NHANES]     Frequency of Communication with Friends and Family: Three times a week     Frequency of Social Gatherings with Friends and Family: Once a week     Attends Religious Services: Patient declined     Database administrator or Organizations: Patient declined     Marital Status: Married             Allergies:     Allergies   Allergen Reactions    Oxycodone (Bulk) Nausea And Vomiting     Patient tolerates acetaminophen well             Medications:     Current Outpatient Medications   Medication Sig Dispense Refill    acetaminophen (TYLENOL) 500 MG tablet Take 2 tablets (1,000 mg total) by mouth every four (4) hours as needed for pain.      buPROPion (WELLBUTRIN XL) 150 MG 24 hr tablet Take 1 tablet (150 mg total) by mouth daily.      dextroamphetamine-amphetamine (ADDERALL XR) 20 MG 24 hr capsule Take 1 capsule (20 mg total) by mouth daily.      dextroamphetamine-amphetamine (ADDERALL) 10 mg tablet TAKE 1 TABLET DAILY AFTER LUNCH.      escitalopram oxalate (LEXAPRO) 5 MG tablet Take 1 tablet (5 mg total) by mouth daily. TAKE 1 TABLET (5 MG TOTAL) BY MOUTH DAILY.      MULTIVIT WITH CALCIUM,IRON,MIN [facility] DAILY MULTIVITAMIN ORAL) Take 1 tablet by mouth once daily.      ondansetron (ZOFRAN-ODT) 8 MG disintegrating tablet Take 1 tablet (8 mg total) by mouth every eight (8) hours as needed.      polyethylene glycol (CLEARLAX) 17 gram/dose powder Take as directed for split prep. 238 g 0    polyethylene glycol (CLEARLAX) 17 gram/dose powder Take as directed for split prep. 119 g 0    simethicone (MYLICON) 125 MG chewable tablet Take 2 tablets (250mg  total) with prep as directed. 4 tablet 0    adalimumab-ryvk (SIMLANDI,CF, AUTOINJECTOR) 40 mg/0.4 mL atIK Inject the contents of 1 pen (40 mg) under the skin every seven (7) days. (Patient not taking: Reported on 08/22/2023) 4 each 3    budesonide (ENTOCORT EC) 3 mg 24 hr capsule Take 3 capsules (9 mg total) by mouth every morning for 30 days, THEN 2 capsules (6 mg total) every morning for 14 days, THEN 1 capsule (3 mg total) every morning for 14 days. 132 capsule 0    desipramine (NORPRAMIN) 25 MG tablet TAKE 6 TABLETS (150 MG TOTAL) BY MOUTH DAILY. (Patient not taking: Reported on 08/22/2023) 540 tablet 2    fluticasone (FLONASE) 50 mcg/actuation nasal  spray 1 SPRAY BY EACH NARE ROUTE DAILY. AS DIRECTED (Patient taking differently: 1 spray by Each Nare route daily as needed. As directed) 16 mL 2    HUMIRA PEN CITRATE FREE 40 MG/0.4 ML Inject 0.4 mL (40 mg total) under the skin every seven (7) days. (Patient not taking: Reported on 08/22/2023) 4 each 11    predniSONE (DELTASONE) 10 mg tablet pack Take by mouth daily. 6 day dose pack for hurt arm. (Patient not taking: Reported on 08/22/2023)      sulfamethoxazole-trimethoprim (BACTRIM DS) 800-160 mg per tablet Take 1 tablet (160 mg of trimethoprim total) by mouth every twelve (12) hours.       No current facility-administered medications for this visit.             Physical Exam:   BP 116/71 (BP Site: L Arm, BP Position: Sitting, BP Cuff Size: Medium)  - Pulse 65  - Temp 36.4 ??C (97.5 ??F) (Temporal)  - Ht 167.6 cm (5' 6)  - Wt (!) 101.6 kg (224 lb)  - LMP 12/02/2016 (Approximate)  - BMI 36.15 kg/m??     GEN: no apparent distress, appears comfortable on exam  HEENT: OP clear with no erythema, lesions, exudate, mucous membranes moist  NECK: Supple, no lymphadenopathy  LUNGS: CTAB, no wheezes, rales, or rhonchi  CV: S1/S2, RRR, no murmurs  ABD: Soft, nontender, no rebound/guarding, nondistended, normoactive bowel sounds, no appreciable organomegaly  Extremities: no cyanosis, clubbing or edema, normal gait  Psych: affect appropriate, A&O x3  SKIN: no visible lesions on face, neck, arms, abdomen          Labs, Data & Indices:     Lab Review:   Lab Results   Component Value Date    WBC 4.9 05/17/2022    WBC 6.5 04/29/2016    RBC 4.31 05/17/2022    RBC 4.50 04/29/2016    HGB 13.3 05/17/2022    HGB 13.2 04/29/2016     Lab Results   Component Value Date    AST 18 08/22/2023    AST 13 04/29/2016    ALT 12 08/22/2023    ALT 9 04/29/2016    BUN 14 08/22/2023    BUN 11 04/29/2016    Creatinine 0.86 08/22/2023    Creatinine 0.80 04/29/2016    CO2 26.6 08/22/2023    CO2 21 04/29/2016    Albumin 4.0 08/22/2023    Albumin 4.3 08/19/2014    Calcium 9.7 08/22/2023    Calcium 9.4 04/29/2016     Lab Results   Component Value Date    TSH 2.810 04/29/2016      ...........................................................................................................................................Marland Kitchen   Diagnosis ICD-10-CM Associated Orders   1. Crohn's disease of small intestine without complication (CMS-HCC)  K50.00 Comprehensive metabolic panel     ESR     C-reactive protein     Ferritin     Iron Level and TIBC     Vitamin B12 Level     Vitamin D 25 hydroxy     Quantiferon TB Gold Plus     budesonide (ENTOCORT EC) 3 mg 24 hr capsule     Quantiferon TB Gold Plus     Vitamin D 25 hydroxy     Vitamin B12 Level     Iron Level and TIBC     Ferritin     C-reactive protein     ESR     Comprehensive metabolic panel  Assessment & Recommendations:   Disease state:  Ileocolonic Crohn's with perianal disease and anorectal stricture    Lizann Edelman is a 53 y.o. female with a long standing hx of stricturing ileocolonic Crohn's disease.  She also has hx of perianal disease (last requiring drainage by Dr. Elenore Rota 2018).  She was well maintained in clinical and endoscopic remission on on weekly Humira monotherapy. However due to insurance issues she has had a month-long gap in treatment. She is now having some symptoms of recurrent colonic Crohn's disease. I think the symptoms are due to delay in Humira and we will plan to restart TNF (without re-loading due to overall mild nature of symptoms and relatively short duration off therapy).  We will also use a short budesonide taper given the active symptoms and check labs today. If she has a good response we will continue Humira monotherapy as planned (And will be due for colonoscopy ~2027).  If she does not have a good response to Humira, we will evaluate further including Humira trough level and likely colonoscopy.    PLAN:   Labs today  Resume Humira as planned  Budesonide taper - 9mg  x 1 month, then taper every 2 weeks.   4.  Tentative colonoscopy as scheduled winter 2027, sooner if symptoms do not respond  5. Will remain off Desipramine for now.   6.  If she ultimately establishes care closer to home in [place], I suggested seeing Dr. Chilton Si or Dr. Steele Sizer at [facility].     IBD health maintenance:  Influenza vaccine: 2012, 117/17, 04/2022  Pneumonia vaccine: Prevnar 02/2014; Pneumovax 3//08/2018  COVID Vaccine:  Pfizer April 2021 x 2 doses, 3rd shot 04/2020  Hepatitis B:   TB testing:   Chickenpox/Shingles history: Shingrix #18 November 2021, #2 04/2022  Bone denistometry:   Derm appointment:  Last small bowel imaging:   Last colonoscopy: 01/2019  PAP smear:   --------------------------------------------  Author: Zetta Bills, MD 08/27/2023 11:09 AM    Zetta Bills, MD  Associate Professor of Medicine  Division of Gastroenterology & Hepatology  [place] [facility]  =========================================

## 2023-08-22 ENCOUNTER — Ambulatory Visit: Admit: 2023-08-22 | Discharge: 2023-08-22 | Payer: PRIVATE HEALTH INSURANCE

## 2023-08-22 DIAGNOSIS — K5000 Crohn's disease of small intestine without complications: Principal | ICD-10-CM

## 2023-08-22 LAB — IRON & TIBC
IRON SATURATION: 31 % (ref 20–55)
IRON: 107 ug/dL (ref 50–170)
TOTAL IRON BINDING CAPACITY: 342 ug/dL (ref 250–425)

## 2023-08-22 LAB — COMPREHENSIVE METABOLIC PANEL
ALBUMIN: 4 g/dL (ref 3.4–5.0)
ALKALINE PHOSPHATASE: 73 U/L (ref 46–116)
ALT (SGPT): 12 U/L (ref 10–49)
ANION GAP: 8 mmol/L (ref 5–14)
AST (SGOT): 18 U/L (ref <=34)
BILIRUBIN TOTAL: 0.6 mg/dL (ref 0.3–1.2)
BLOOD UREA NITROGEN: 14 mg/dL (ref 9–23)
BUN / CREAT RATIO: 16
CALCIUM: 9.7 mg/dL (ref 8.7–10.4)
CHLORIDE: 102 mmol/L (ref 98–107)
CO2: 26.6 mmol/L (ref 20.0–31.0)
CREATININE: 0.86 mg/dL (ref 0.55–1.02)
EGFR CKD-EPI (2021) FEMALE: 81 mL/min/{1.73_m2} (ref >=60)
GLUCOSE RANDOM: 85 mg/dL (ref 70–179)
POTASSIUM: 4.2 mmol/L (ref 3.4–4.8)
PROTEIN TOTAL: 8.1 g/dL (ref 5.7–8.2)
SODIUM: 137 mmol/L (ref 135–145)

## 2023-08-22 LAB — C-REACTIVE PROTEIN: C-REACTIVE PROTEIN: <5 mg/L (ref <=10.0)

## 2023-08-22 LAB — FERRITIN: FERRITIN: 50.4 ng/mL (ref 7.3–270.7)

## 2023-08-22 LAB — SEDIMENTATION RATE: ERYTHROCYTE SEDIMENTATION RATE: 31 mm/h — ABNORMAL HIGH (ref 0–30)

## 2023-08-22 LAB — VITAMIN B12: VITAMIN B-12: 500 pg/mL (ref 211–911)

## 2023-08-22 MED ORDER — BUDESONIDE DR - ER 3 MG CAPSULE,DELAYED,EXTENDED RELEASE
ORAL_CAPSULE | ORAL | 0 refills | 58.00 days | Status: CP
Start: 2023-08-22 — End: 2023-10-19

## 2023-08-22 NOTE — Unmapped (Addendum)
 It was a pleasure to see you today!  We will be in touch regarding your lab results.  2.   Restart the Humira biosimilar weekly.  3.   A steroid will help calm down your inflammation. We recommend taking budesonide. The dose is 9mg  (3 pills) for 1 month, then 6mg  (2 pills) for 2 weeks, then 3mg  (1 pill) for 2 weeks, then stop.  4.   Colonoscopy 2027, or sooner if symptoms don't improve with restarting medications and steroids.  5.   Gastroenterologists at [facility] we would recommend for Crohn's disease include: Dr. Carlynn Herald or Dr. Raeanne Barry.

## 2023-08-23 LAB — VITAMIN D 25 HYDROXY: VITAMIN D, TOTAL (25OH): 28 ng/mL (ref 20.0–80.0)

## 2023-08-24 LAB — TB MITOGEN: TB MITOGEN VALUE: 10

## 2023-08-24 LAB — TB AG2: TB AG2 VALUE: 0.07

## 2023-08-24 LAB — QUANTIFERON TB GOLD PLUS
QUANTIFERON ANTIGEN 1 MINUS NIL: 0.04 [IU]/mL
QUANTIFERON ANTIGEN 2 MINUS NIL: 0.03 [IU]/mL
QUANTIFERON MITOGEN: 9.96 [IU]/mL
QUANTIFERON TB GOLD PLUS: NEGATIVE
QUANTIFERON TB NIL VALUE: 0.04 [IU]/mL

## 2023-08-24 LAB — TB AG1: TB AG1 VALUE: 0.08

## 2023-08-24 LAB — TB NIL: TB NIL VALUE: 0.04

## 2023-09-13 DIAGNOSIS — K5000 Crohn's disease of small intestine without complications: Principal | ICD-10-CM

## 2023-09-13 MED ORDER — BUDESONIDE DR - ER 3 MG CAPSULE,DELAYED,EXTENDED RELEASE
ORAL_CAPSULE | ORAL | 1 refills | 0 days
Start: 2023-09-13 — End: ?

## 2023-09-13 NOTE — Unmapped (Addendum)
 I reviewed this patient case and all documentation provided by the learner and was readily available for consultation during their interaction with the patient.  I agree with the assessment and plan listed below.    Teofilo Pod, PharmD   [facility] Specialty Pharmacist      [facility] Delivery [facility] Clinical Assessment & Refill Coordination Note    Jasmine Mitchell, Kelly: [birth year]  Phone: [phone] (home)     All above HIPAA information was verified with patient.     Was a Nurse, learning disability used for this call? No    Specialty Medication(s):   Inflammatory Disorders: Simlandi     Current Outpatient Medications   Medication Sig Dispense Refill    acetaminophen (TYLENOL) 500 MG tablet Take 2 tablets (1,000 mg total) by mouth every four (4) hours as needed for pain.      adalimumab-ryvk [facility], AUTOINJECTOR) 40 mg/0.4 mL atIK Inject the contents of 1 pen (40 mg) under the skin every seven (7) days. (Patient not taking: Reported on 08/22/2023) 4 each 3    budesonide (ENTOCORT EC) 3 mg 24 hr capsule Take 3 capsules (9 mg total) by mouth every morning for 30 days, THEN 2 capsules (6 mg total) every morning for 14 days, THEN 1 capsule (3 mg total) every morning for 14 days. 132 capsule 0    buPROPion (WELLBUTRIN XL) 150 MG 24 hr tablet Take 1 tablet (150 mg total) by mouth daily.      desipramine (NORPRAMIN) 25 MG tablet TAKE 6 TABLETS (150 MG TOTAL) BY MOUTH DAILY. (Patient not taking: Reported on 08/22/2023) 540 tablet 2    dextroamphetamine-amphetamine (ADDERALL XR) 20 MG 24 hr capsule Take 1 capsule (20 mg total) by mouth daily.      dextroamphetamine-amphetamine (ADDERALL) 10 mg tablet TAKE 1 TABLET DAILY AFTER LUNCH.      escitalopram oxalate (LEXAPRO) 5 MG tablet Take 1 tablet (5 mg total) by mouth daily. TAKE 1 TABLET (5 MG TOTAL) BY MOUTH DAILY.      fluticasone (FLONASE) 50 mcg/actuation nasal spray 1 SPRAY BY EACH NARE ROUTE DAILY. AS DIRECTED (Patient taking differently: 1 spray by Each Nare route daily as needed. As directed) 16 mL 2    HUMIRA PEN CITRATE FREE 40 MG/0.4 ML Inject 0.4 mL (40 mg total) under the skin every seven (7) days. (Patient not taking: Reported on 08/22/2023) 4 each 11    MULTIVIT WITH CALCIUM,IRON,MIN (WOMEN'S DAILY MULTIVITAMIN ORAL) Take 1 tablet by mouth once daily.      ondansetron (ZOFRAN-ODT) 8 MG disintegrating tablet Take 1 tablet (8 mg total) by mouth every eight (8) hours as needed.      polyethylene glycol (CLEARLAX) 17 gram/dose powder Take as directed for split prep. 238 g 0    polyethylene glycol (CLEARLAX) 17 gram/dose powder Take as directed for split prep. 119 g 0    predniSONE (DELTASONE) 10 mg tablet pack Take by mouth daily. 6 day dose pack for hurt arm. (Patient not taking: Reported on 08/22/2023)      simethicone (MYLICON) 125 MG chewable tablet Take 2 tablets (250mg  total) with prep as directed. 4 tablet 0    sulfamethoxazole-trimethoprim (BACTRIM DS) 800-160 mg per tablet Take 1 tablet (160 mg of trimethoprim total) by mouth every twelve (12) hours.       No current facility-administered medications for this visit.        Changes to medications: Jasmine Mitchell reports no changes at this  time.    Medication list has been reviewed and updated in Epic: Yes    Allergies   Allergen Reactions    Oxycodone (Bulk) Nausea And Vomiting     Patient tolerates acetaminophen well       Changes to allergies: No    Allergies have been reviewed and updated in Epic: Yes    SPECIALTY MEDICATION ADHERENCE     Simlandi 40 mg/0.4 mL: 0 doses of medicine on hand       Medication Adherence    Patient reported X missed doses in the last month: 0  Specialty Medication: Simlandi  Informant: patient  Confirmed plan for next specialty medication refill: delivery by pharmacy  Refills needed for supportive medications: not needed          Specialty medication(s) dose(s) confirmed: Regimen is correct and unchanged.     Are there any concerns with adherence? No    Adherence counseling provided? Not needed    CLINICAL MANAGEMENT AND INTERVENTION      Clinical Benefit Assessment:    Do you feel the medicine is effective or helping your condition? Yes    Clinical Benefit counseling provided? Not needed    Adverse Effects Assessment:    Are you experiencing any side effects? No    Are you experiencing difficulty administering your medicine?  Had difficulty with first injection and had to use another autoinjector, but now doing well with injections. Patient reaching out to [employer] for possible replacement.     Quality of Life Assessment:    Quality of Life    Rheumatology  Oncology  Dermatology  Cystic Fibrosis          How many days over the past month did your crohn's disease  keep you from your normal activities? For example, brushing your teeth or getting up in the morning. 0    Have you discussed this with your provider? Not needed    Acute Infection Status:    Acute infections noted within Epic:  No active infections    Patient reported infection: None    Therapy Appropriateness:    Is therapy appropriate based on current medication list, adverse reactions, adherence, clinical benefit and progress toward achieving therapeutic goals? Yes, therapy is appropriate and should be continued     Clinical Intervention:    Was an intervention completed as part of this clinical assessment? No    DISEASE/MEDICATION-SPECIFIC INFORMATION      For patients on injectable medications: Patient currently has 0 doses left.  Next injection is scheduled for 09/16/2023.    Chronic Inflammatory Diseases: Have you experienced any flares in the last month? No  Has this been reported to your provider? Not applicable    PATIENT SPECIFIC NEEDS     Does the patient have any physical, cognitive, or cultural barriers? No    Is the patient high risk? No    Does the patient require physician intervention or other additional services (i.e., nutrition, smoking cessation, social work)? No    Does the patient have an additional or emergency contact listed in their chart? Yes    SOCIAL DETERMINANTS OF HEALTH     At the [facility], we have learned that life circumstances - like trouble affording food, housing, utilities, or transportation can affect the health of many of our patients.   That is why we wanted to ask: are you currently experiencing any life circumstances that are negatively impacting your health and/or quality of life? Patient  declined to answer    Social Drivers of Health     Food Insecurity: No Food Insecurity (11/22/2021)    Received from [facility], [facility]    Hunger Vital Sign     Worried About Running Out of Food in the Last Year: Never true     Ran Out of Food in the Last Year: Never true   Tobacco Use: Low Risk  (08/22/2023)    Patient History     Smoking Tobacco Use: Never     Smokeless Tobacco Use: Never     Passive Exposure: Not on file   Transportation Needs: No Transportation Needs (11/22/2021)    Received from [facility], [facility]    [facility] - Transportation     Lack of Transportation (Medical): No     Lack of Transportation (Non-Medical): No   Alcohol Use: Not At Risk (04/04/2018)    Received from [facility] visits prior to 08/20/2022., [facility] visits prior to 08/20/2022.    AUDIT-C     Frequency of Alcohol Consumption: Never     Average Number of Drinks: Not on file     Frequency of Binge Drinking: Not on file   Housing: Not on file   Physical Activity: Unknown (11/22/2021)    Received from [facility], [facility]    Exercise Vital Sign     Days of Exercise per Week: 0 days     Minutes of Exercise per Session: Not on file   Utilities: Not on file   Stress: No Stress Concern Present (11/22/2021)    Received from [facility], [facility]    [facility] - Occupational Stress Questionnaire     Feeling of Stress : Not at all   Interpersonal Safety: Not on file   Substance Use: Not on file (04/30/2023)   Intimate Partner Violence: Not on file   Social Connections: Unknown (11/22/2021)    Received from [facility], [facility]    Social Connection and Isolation Panel [NHANES]     Frequency of Communication with Friends and Family: Three times a week     Frequency of Social Gatherings with Friends and Family: Once a week     Attends Religious Services: Patient declined     Database administrator or Organizations: Patient declined     Attends Banker Meetings: Not on file     Marital Status: Married   Physicist, medical Strain: Low Risk  (11/22/2021)    Received from [facility], [facility]    Overall Financial Resource Strain (CARDIA)     Difficulty of Paying Living Expenses: Not very hard   Depression: Not at risk (02/24/2021)    Received from [facility] visits prior to 08/20/2022., [facility] visits prior to 08/20/2022.    PHQ-2     SDOH PHQ2 SCORE: 0   Internet Connectivity: Not on file   Health Literacy: Not on file       Would you be willing to receive help with any of the needs that you have identified today? Not applicable       SHIPPING     Specialty Medication(s) to be Shipped:   Inflammatory Disorders: Simlandi    Other medication(s) to be shipped: No additional medications requested for fill at this time     Changes to insurance: No    Cost and Payment: Patient has a $0 copay, payment  information is not required.    Delivery Scheduled: Yes, Expected medication delivery date: 09/19/2023.     Medication will be delivered via UPS to the confirmed prescription address in [facility].    The patient will receive a drug information handout for each medication shipped and additional FDA Medication Guides as required.  Verified that patient has previously received a Conservation officer, historic buildings and a Surveyor, mining.    The patient or caregiver noted above participated in the development of this care plan and knows that they can request review of or adjustments to the care plan at any time.      All of the patient's questions and concerns have been addressed.    Arlana Lindau, PharmD, CPP   [facility] Home Delivery Pharm[facility] Specialty Pharmacist

## 2023-09-14 MED ORDER — BUDESONIDE DR - ER 3 MG CAPSULE,DELAYED,EXTENDED RELEASE
ORAL_CAPSULE | ORAL | 1 refills | 58 days
Start: 2023-09-14 — End: 2023-11-11

## 2023-09-14 NOTE — Unmapped (Signed)
 Pt reported a Simlandi misfire, in need of replacement. Called [employer] at [phone] and reported misfire. [employer] called [facility]  and they will send a replacement pen kit to [facility] to then be dispensed to the patient.

## 2023-09-14 NOTE — Unmapped (Addendum)
 It was identified that this patient required additional assistance in obtaining a replacement due to an injection misfire., This pharmacist received a call from the manufacturer, [employer], to provide approval for replacement product..    3/27: [employer] m[employer] called and provided approval for replacement product; authorization case #: [id number]; product will be sent directly to [facility] in 2-7 business days  4/2: Received delivery of replacement product - scheduled shipment with patient to be delivered on Thursday, 4/3.    Approximate time spent: 5-10 minutes    Teofilo Pod, PharmD, Clinical Specialty Pharmacist  [facility] Delivery [facility]

## 2023-09-18 NOTE — Unmapped (Signed)
 [employer] 's SIMLANDI(CF) AUTOINJECTOR 40 mg/0.4 mL Atik (adalimumab-ryvk) shipment will be delayed as a result of waiting on ins. To call back on getting approved or not     I have reached out to the patient  at [phone]  and communicated the delay. We will call the patient back to reschedule the delivery upon resolution. We have not confirmed the new delivery date.

## 2023-09-19 NOTE — Unmapped (Signed)
 Reached out to [employer] who contracts with [employer] regarding patient's Humira fill. [facility]ai informed that [employer] removed Humira from formulary in January but they were not notified until February. The preferred adalimumab product is Simlandi and should be covered through [employer], therefore not needed [employer]. However, I let Megan ([employer] rep) know that our pharmacy is still getting a rejection as not a covered benefit. She was under the impression [employer] account manager has resolved issue but it has not been. She will be reaching out to Building surveyor to get in touch with me to help resolve as patient due for fill ASAP.    Awaiting return call from [facility] manager, Denny Peon.    Edsel Petrin, PharmD, BCPS, CPP  Clinical Pharmacist Practitioner, GI/IBD  [phone]

## 2023-09-20 DIAGNOSIS — K5000 Crohn's disease of small intestine without complications: Principal | ICD-10-CM

## 2023-09-20 DIAGNOSIS — K5080 Crohn's disease of both small and large intestine without complications: Principal | ICD-10-CM

## 2023-09-20 MED ORDER — ADALIMUMAB-RYVK 40 MG/0.4 ML SUBCUTANEOUS AUTO-INJECTOR KIT
SUBCUTANEOUS | 3 refills | 28 days | Status: CP
Start: 2023-09-20 — End: ?
  Filled 2023-09-20: qty 2, 28d supply, fill #0

## 2023-09-20 NOTE — Unmapped (Signed)
 Opened in error

## 2023-09-20 NOTE — Unmapped (Signed)
 Simlandi replacement pens received by [facility] and shipped to pt. Going forward insurance is requiring pt to fill through [employer].

## 2023-09-20 NOTE — Unmapped (Signed)
 Received phone call from Denny Peon, Scientist, water quality at [employer], stating that there was miscommunication about patient's Simlandi PA at the [facility] and they are actively working on approval right now. Patient must fill through their specialty pharmacy, [facility]. Rx re-routed. Called patient and left VM with update.    Edsel Petrin, PharmD, BCPS, CPP  Clinical Pharmacist Practitioner, GI/IBD  [phone]

## 2023-09-21 NOTE — Unmapped (Signed)
 Specialty Medication(s): Simlandi    Jasmine Mitchell has been dis-enrolled from the [employer] specialty pharmacy services as a result of a pharmacy change resulting from insurance limitations. The insurance company requires the patient fill at [facility] .    Additional information provided to the patient: n/a    Teofilo Pod, PharmD  [facility] Special[facility] and Home Delivery Pharmacy Specialty Pharmacist

## 2023-10-09 ENCOUNTER — Ambulatory Visit

## 2023-10-17 ENCOUNTER — Ambulatory Visit

## 2023-10-28 ENCOUNTER — Other Ambulatory Visit: Payer: Self-pay | Admitting: Family Medicine

## 2023-10-28 DIAGNOSIS — F419 Anxiety disorder, unspecified: Secondary | ICD-10-CM

## 2023-10-29 NOTE — Telephone Encounter (Signed)
 Pt missed a follow up visit with me, please have pt schedule an appointment and I will fill her rx until her appointment

## 2023-11-01 DIAGNOSIS — K5000 Crohn's disease of small intestine without complications: Principal | ICD-10-CM

## 2023-11-01 MED ORDER — HUMIRA PEN CITRATE FREE 40 MG/0.4 ML
SUBCUTANEOUS | 3 refills | 84.00000 days | Status: CP
Start: 2023-11-01 — End: ?
  Filled 2024-01-02: qty 4, 28d supply, fill #0

## 2023-11-01 MED ORDER — CHOLESTYRAMINE LIGHT 4 GRAM POWDER FOR SUSPENSION IN A PACKET
PACK | Freq: Every day | ORAL | 11 refills | 30.00000 days | Status: CP
Start: 2023-11-01 — End: ?

## 2023-12-19 DIAGNOSIS — K5000 Crohn's disease of small intestine without complications: Principal | ICD-10-CM

## 2023-12-21 DIAGNOSIS — S66812D Strain of other specified muscles, fascia and tendons at wrist and hand level, left hand, subsequent encounter: Secondary | ICD-10-CM | POA: Diagnosis not present

## 2023-12-21 DIAGNOSIS — G5603 Carpal tunnel syndrome, bilateral upper limbs: Secondary | ICD-10-CM | POA: Diagnosis not present

## 2023-12-27 NOTE — Unmapped (Signed)
 [facility] Specialty Medication Onboarding    Specialty Medication: Humira   Prior Authorization: Approved   Financial Assistance: Yes - copay card approved as secondary   Final Copay/Day Supply: $0 / 28 days    Insurance Restrictions: Yes - max 1 month supply     Notes to Pharmacist: Already scheduled  Credit Card on File: not applicable  Start Date on Rx:      The triage team has completed the benefits investigation and has determined that the patient is able to fill this medication at [facility]. Please contact the patient to complete the onboarding or follow up with the prescribing physician as needed.

## 2023-12-27 NOTE — Unmapped (Signed)
 [facility]    Patient Onboarding/Medication Counseling    New insurance is active immediately, copay $0    Jasmine Mitchell is a 53 y.o. female with Crohn's who I am counseling today on re-initiation of therapy (changing from Simlandi  back to Humira  due to insurance preference).  I am speaking to the patient.    Was a Nurse, learning disability used for this call? No    Verified patient's date of birth / HIPAA.    Specialty medication(s) to be sent: Inflammatory Disorders: Humira       Non-specialty medications/supplies to be sent: n/a      Medications not needed at this time: n/a         Humira  (adalimumab )    The patient declined counseling on medication administration, missed dose instructions, goals of therapy, side effects and monitoring parameters, warnings and precautions, drug/food interactions, and storage, handling precautions, and disposal because they have taken the medication previously. The information in the declined sections below are for informational purposes only and was not discussed with patient.       Medication & Administration     Dosage: Crohn's Disease: Inject 1 pen (40mg ) every 7 days (continuation)    Lab tests required prior to treatment initiation:  Tuberculosis: Tuberculosis screening resulted in a non-reactive Quantiferon TB Gold assay.(Completed: 08/22/2023)  Hepatitis B: Hepatitis B serology studies are not documented in the patient's chart but medication prescriber has indicated they are aware and wishing to initiate treatment at this time.    Administration:     Prefilled auto-injector pen  1. Gather all supplies needed for injection on a clean, flat working surface: medication pen removed from packaging, alcohol swab, sharps container, etc.  2. Look at the medication label - look for correct medication, correct dose, and check the expiration date  3. Look at the medication - the liquid visible in the window on the side of the pen device should appear clear and colorless  4. Lay the auto-injector pen on a flat surface and allow it to warm up to room temperature for at least 30-45 minutes  5. Select injection site - you can use the front of your thigh or your belly (but not the area 2 inches around your belly button); if someone else is giving you the injection you can also use your upper arm in the skin covering your triceps muscle  6. Prepare injection site - wash your hands and clean the skin at the injection site with an alcohol swab and let it air dry, do not touch the injection site again before the injection  7. Pull the 2 safety caps straight off - gray/white to uncover the needle cover and the plum cap to uncover the plum activator button, do not remove until immediately prior to injection and do not touch the white needle cover  8. Gently squeeze the area of cleaned skin and hold it firmly to create a firm surface at the selected injection site  9. Put the white needle cover against your skin at the injection site at a 90 degree angle, hold the pen such that you can see the clear medication window  10. Press down and hold the pen firmly against your skin, press the plum activator button to initiate the injection, there will be a click when the injection starts  11. Continue to hold the pen firmly against your skin for about 10-15 seconds - the window will start to turn solid yellow  12. To verify  the injection is complete after 10-15 seconds, look and ensure the window is solid yellow and then pull the pen away from your skin  13. Dispose of the used auto-injector pen immediately in your sharps disposal container the needle will be covered automatically  14. If you see any blood at the injection site, press a cotton ball or gauze on the site and maintain pressure until the bleeding stops, do not rub the injection site    Adherence/Missed dose instructions:  If your injection is given more than 3 days after your scheduled injection date - consult your pharmacist for additional instructions on how to adjust your dosing schedule.    Goals of Therapy     - Achieve remission of symptoms  - Maintain remission of symptoms  - Minimize long-term systemic glucocorticoid use  - Prevent need for surgical procedures  - Maintenance of effective psychosocial functioning    Side Effects & Monitoring Parameters     Injection site reaction (redness, irritation, inflammation localized to the site of administration)  Signs of a common cold - minor sore throat, runny or stuffy nose, etc.  Upset stomach  Headache    The following side effects should be reported to the provider:  Signs of a hypersensitivity reaction - rash; hives; itching; red, swollen, blistered, or peeling skin; wheezing; tightness in the chest or throat; difficulty breathing, swallowing, or talking; swelling of the mouth, face, lips, tongue, or throat; etc.  Reduced immune function - report signs of infection such as fever; chills; body aches; very bad sore throat; ear or sinus pain; cough; more sputum or change in color of sputum; pain with passing urine; wound that will not heal, etc.  Also at a slightly higher risk of some malignancies (mainly skin and blood cancers) due to this reduced immune function.  In the case of signs of infection - the patient should hold the next dose of Humira ?? and call your primary care provider to ensure adequate medical care.  Treatment may be resumed when infection is treated and patient is asymptomatic.  Changes in skin - a new growth or lump that forms; changes in shape, size, or color of a previous mole or marking  Signs of unexplained bruising or bleeding - throwing up blood or emesis that looks like coffee grounds; black, tarry, or bloody stool; etc.  Signs of new or worsening heart failure - shortness of breath; sudden weight gain; heartbeat that is not normal; swelling in the arms or legs that is new or worse      Contraindications, Warnings, & Precautions     Have your bloodwork checked as you have been told by your prescriber  Talk with your doctor if you are pregnant, planning to become pregnant, or breastfeeding  Discuss the possible need for holding your dose(s) of Humira ?? when a planned procedure is scheduled with the prescriber as it may delay healing/recovery timeline       Drug/Food Interactions     Medication list reviewed in Epic. The patient was instructed to inform the care team before taking any new medications or supplements. No drug interactions identified.   Talk with you prescriber or pharmacist before receiving any live vaccinations while taking this medication and after you stop taking it    Storage, Handling Precautions, & Disposal     Store this medication in the refrigerator.  Do not freeze  If needed, you may store at room temperature for up to 14 days  Store in [employer],  protected from light  Do not shake  Dispose of used syringes/pens in a sharps disposal container          Current Medications (including OTC/herbals), Comorbidities and Allergies     Current Medications[1]    Allergies[2]    Problem List[3]    Medication list has been reviewed and updated in Epic: Yes    Allergies have been reviewed and updated in Epic: Yes    Appropriateness of Therapy     Acute infections noted within Epic:  No active infections  Patient reported infection: None    Is the medication and dose appropriate based on diagnosis, medication list, comorbidities, allergies, medical history, patient???s ability to self-administer the medication, and therapeutic goals? Yes    Prescription has been clinically reviewed: Yes      Baseline Quality of Life Assessment      How many days over the past month did your Crohn's  keep you from your normal activities? For example, brushing your teeth or getting up in the morning. 0    Financial Information     Medication Assistance provided: Prior Authorization and Copay Assistance    Anticipated copay of $0 reviewed with patient. Verified delivery address.    Delivery Information     Scheduled delivery date: 7/16    Expected start date: 7/18      Medication will be delivered via UPS to the prescription address in [facility].  This shipment will not require a signature.      Explained the services we provide at [facility] and that each month we would call to set up refills.  Stressed importance of returning phone calls so that we could ensure they receive their medications in time each month.  Informed patient that we should be setting up refills 7-10 days prior to when they will run out of medication.  A pharmacist will reach out to perform a clinical assessment periodically.  Informed patient that a welcome packet, containing information about our pharmacy and other support services, a Notice of Privacy Practices, and a drug information handout will be sent.      The patient or caregiver noted above participated in the development of this care plan and knows that they can request review of or adjustments to the care plan at any time.      Patient or caregiver verbalized understanding of the above information as well as how to contact the pharmacy at [phone] option 4 with any questions/concerns.  The pharmacy is open Monday through Friday 8:30am-4:30pm.  A pharmacist is available 24/7 via pager to answer any clinical questions they may have.    Patient Specific Needs     Does the patient have any physical, cognitive, or cultural barriers? No    Does the patient have adequate living arrangements? (i.e. the ability to store and take their medication appropriately) Yes    Did you identify any home environmental safety or security hazards? No    Patient prefers to have medications discussed with  Patient     Is the patient or caregiver able to read and understand education materials at a high school level or above? Yes    Patient's primary language is  English     Is the patient high risk? No    Does the patient have an additional or emergency contact listed in their chart? Yes    SOCIAL DETERMINANTS OF HEALTH     At the [facility], we have  learned that life circumstances - like trouble affording food, housing, utilities, or transportation can affect the health of many of our patients.   That is why we wanted to ask: are you currently experiencing any life circumstances that are negatively impacting your health and/or quality of life? No    Social Drivers of Health     Food Insecurity: No Food Insecurity (11/22/2021)    Received from [facility]    Hunger Vital Sign     Worried About Running Out of Food in the Last Year: Never true     Ran Out of Food in the Last Year: Never true   Tobacco Use: Low Risk  (08/22/2023)    Patient History     Smoking Tobacco Use: Never     Smokeless Tobacco Use: Never     Passive Exposure: Not on file   Transportation Needs: No Transportation Needs (11/22/2021)    Received from [facility] - Transportation     Lack of Transportation (Medical): No     Lack of Transportation (Non-Medical): No   Alcohol Use: Not At Risk (04/04/2018)    Received from [facility] visits prior to 08/20/2022.    AUDIT-C     Frequency of Alcohol Consumption: Never     Average Number of Drinks: Not on file     Frequency of Binge Drinking: Not on file   Housing: Not on file   Physical Activity: Unknown (11/22/2021)    Received from [facility]    Exercise Vital Sign     Days of Exercise per Week: 0 days     Minutes of Exercise per Session: Not on file   Utilities: Not on file   Stress: No Stress Concern Present (11/22/2021)    Received from [facility] - Occupational Stress Questionnaire     Feeling of Stress : Not at all   Interpersonal Safety: Not on file   Substance Use: Not on file (04/30/2023)   Intimate Partner Violence: Not on file   Social Connections: Unknown (11/22/2021)    Received from [facility]    Social Connection and Isolation Panel     Frequency of Communication with Friends and Family: Three times a week     Frequency of Social Gatherings with Friends and Family: Once a week     Attends Religious Services: Patient declined     Database administrator or Organizations: Patient declined     Attends Engineer, structural: Not on file     Marital Status: Married   Physicist, medical Strain: Low Risk  (11/22/2021)    Received from [facility]    Overall Financial Resource Strain (CARDIA)     Difficulty of Paying Living Expenses: Not very hard   Health Literacy: Not on file   Internet Connectivity: Not on file       Would you be willing to receive help with any of the needs that you have identified today? Not applicable       Harlene DELENA Elder, PharmD  [facility] Specialty Pharmacist       [1]   Current Outpatient Medications   Medication Sig Dispense Refill    acetaminophen (TYLENOL) 500 MG tablet Take 2 tablets (1,000 mg total) by mouth every four (4) hours as needed for pain.      adalimumab -ryvk (SIMLANDI ,CF, AUTOINJECTOR) 40 mg/0.4 mL atIK Inject the contents  of 1 pen (40 mg) under the skin every seven (7) days. 4 each 3    buPROPion (WELLBUTRIN XL) 150 MG 24 hr tablet Take 1 tablet (150 mg total) by mouth daily.      cholestyramine  (CHOLESTYRAMINE  LIGHT) 4 gram PwPk Take 1 packet by mouth daily. 30 packet 11    desipramine  (NORPRAMIN ) 25 MG tablet TAKE 6 TABLETS (150 MG TOTAL) BY MOUTH DAILY. (Patient not taking: Reported on 08/22/2023) 540 tablet 2    dextroamphetamine-amphetamine (ADDERALL XR) 20 MG 24 hr capsule Take 1 capsule (20 mg total) by mouth daily.      dextroamphetamine-amphetamine (ADDERALL) 10 mg tablet TAKE 1 TABLET DAILY AFTER LUNCH.      escitalopram oxalate (LEXAPRO) 5 MG tablet Take 1 tablet (5 mg total) by mouth daily. TAKE 1 TABLET (5 MG TOTAL) BY MOUTH DAILY.      fluticasone (FLONASE) 50 mcg/actuation nasal spray 1 SPRAY BY EACH NARE ROUTE DAILY. AS DIRECTED (Patient taking differently: 1 spray by Each Nare route daily as needed. As directed) 16 mL 2    HUMIRA  PEN CITRATE FREE 40 MG/0.4 ML Inject 1 pen (40 mg total) under the skin every seven (7) days. 12 each 3    MULTIVIT WITH CALCIUM,IRON,MIN (WOMEN'S DAILY MULTIVITAMIN ORAL) Take 1 tablet by mouth once daily.      ondansetron (ZOFRAN-ODT) 8 MG disintegrating tablet Take 1 tablet (8 mg total) by mouth every eight (8) hours as needed.      polyethylene glycol (CLEARLAX) 17 gram/dose powder Take as directed for split prep. 238 g 0    polyethylene glycol (CLEARLAX) 17 gram/dose powder Take as directed for split prep. 119 g 0    predniSONE (DELTASONE) 10 mg tablet pack Take by mouth daily. 6 day dose pack for hurt arm. (Patient not taking: Reported on 08/22/2023)      simethicone  (MYLICON) 125 MG chewable tablet Take 2 tablets (250mg  total) with prep as directed. 4 tablet 0    sulfamethoxazole -trimethoprim  (BACTRIM  DS) 800-160 mg per tablet Take 1 tablet (160 mg of trimethoprim  total) by mouth every twelve (12) hours.       No current facility-administered medications for this visit.   [2]   Allergies  Allergen Reactions    Oxycodone  (Bulk) Nausea And Vomiting     Patient tolerates acetaminophen well   [3]   Patient Active Problem List  Diagnosis    Crohn's disease       Fever    Hematuria    Cough    Crohn's disease with complication

## 2024-01-16 ENCOUNTER — Ambulatory Visit: Payer: Self-pay | Admitting: Family Medicine

## 2024-01-17 NOTE — Unmapped (Signed)
 [facility] Clinical Assessment & Refill Coordination Note    Jasmine Mitchell, Jasmine Mitchell, Jasmine Mitchell  Phone: [phone] (home)     All above HIPAA information was verified with patient.     Was a Nurse, learning disability used for this call? No    Specialty Medication(s):   Inflammatory Disorders: Humira      Current Medications[1]     Changes to medications: Ziona reports no changes at this time.    Medication list has been reviewed and updated in Epic: Yes    Allergies[2]    Changes to allergies: No    Allergies have been reviewed and updated in Epic: Yes    SPECIALTY MEDICATION ADHERENCE     Humira  40 mg/0.35mL: 2 doses of medicine on hand   Medication Adherence    Patient reported X missed doses in the last month: 0  Specialty Medication: Humira  40mg /0.20mL -1q7d  Patient is on additional specialty medications: No  Informant: patient          Specialty medication(s) dose(s) confirmed: Regimen is correct and unchanged.     Are there any concerns with adherence? No    Adherence counseling provided? Not needed    CLINICAL MANAGEMENT AND INTERVENTION      Clinical Benefit Assessment:    Do you feel the medicine is effective or helping your condition? Yes    Clinical Benefit counseling provided? Not needed    Adverse Effects Assessment:    Are you experiencing any side effects? No    Are you experiencing difficulty administering your medicine? No    Quality of Life Assessment:    Quality of Life    Rheumatology  Oncology  Dermatology  Cystic Fibrosis          How many days over the past month did your crohn's  keep you from your normal activities? For example, brushing your teeth or getting up in the morning. 0    Have you discussed this with your provider? Not needed    Acute Infection Status:    Acute infections noted within Epic:  No active infections    Patient reported infection: None    Therapy Appropriateness:    Is therapy appropriate based on current medication list, adverse reactions, adherence, clinical benefit and progress toward achieving therapeutic goals? Yes, therapy is appropriate and should be continued     Clinical Intervention:    Was an intervention completed as part of this clinical assessment? No    DISEASE/MEDICATION-SPECIFIC INFORMATION      For patients on injectable medications: Patient currently has 2 doses left.  Next injection is scheduled for 8/1 & 8/8.    Chronic Inflammatory Diseases: Have you experienced any flares in the last month? No  Has this been reported to your provider? Not applicable    PATIENT SPECIFIC NEEDS     Does the patient have any physical, cognitive, or cultural barriers? No    Is the patient high risk? No    Does the patient require physician intervention or other additional services (i.e., nutrition, smoking cessation, social work)? No    Does the patient have an additional or emergency contact listed in their chart? Yes    SOCIAL DETERMINANTS OF HEALTH     At the [facility], we have learned that life circumstances - like trouble affording food, housing, utilities, or transportation can affect the health of many of our patients.   That is why we wanted to ask: are you currently experiencing  any life circumstances that are negatively impacting your health and/or quality of life? No    Social Drivers of Health     Food Insecurity: No Food Insecurity (11/22/2021)    Received from [facility]    Hunger Vital Sign     Worried About Running Out of Food in the Last Year: Never true     Ran Out of Food in the Last Year: Never true   Tobacco Use: Low Risk  (08/22/2023)    Patient History     Smoking Tobacco Use: Never     Smokeless Tobacco Use: Never     Passive Exposure: Not on file   Transportation Needs: No Transportation Needs (11/22/2021)    Received from [facility] - Transportation     Lack of Transportation (Medical): No     Lack of Transportation (Non-Medical): No   Alcohol Use: Not At Risk (04/04/2018)    Received from [facility] visits prior to 08/20/2022.    AUDIT-C     Frequency of Alcohol Consumption: Never     Average Number of Drinks: Not on file     Frequency of Binge Drinking: Not on file   Housing: Not on file   Physical Activity: Unknown (11/22/2021)    Received from [facility]    Exercise Vital Sign     Days of Exercise per Week: 0 days     Minutes of Exercise per Session: Not on file   Utilities: Not on file   Stress: No Stress Concern Present (11/22/2021)    Received from [facility] - Occupational Stress Questionnaire     Feeling of Stress : Not at all   Interpersonal Safety: Not on file   Substance Use: Not on file (04/30/2023)   Intimate Partner Violence: Not on file   Social Connections: Unknown (11/22/2021)    Received from [facility]    Social Connection and Isolation Panel     Frequency of Communication with Friends and Family: Three times a week     Frequency of Social Gatherings with Friends and Family: Once a week     Attends Religious Services: Patient declined     Database administrator or Organizations: Patient declined     Attends Engineer, structural: Not on file     Marital Status: Married   Physicist, medical Strain: Low Risk  (11/22/2021)    Received from [facility]    Overall Financial Resource Strain (CARDIA)     Difficulty of Paying Living Expenses: Not very hard   Health Literacy: Not on file   Internet Connectivity: Not on file       Would you be willing to receive help with any of the needs that you have identified today? Not applicable       SHIPPING     Specialty Medication(s) to be Shipped:   Inflammatory Disorders: Humira     Other medication(s) to be shipped: No additional medications requested for fill at this time     Changes to insurance: No    Cost and Payment: Patient has a $0 copay, payment information is not required.    Delivery Scheduled: Yes, Expected medication delivery date: 8/6.     Medication will be delivered via UPS to the confirmed prescription address in [facility].    The patient will receive a drug information handout for each medication shipped and additional FDA Medication Guides  as required.  Verified that patient has previously received a Conservation officer, historic buildings and a Surveyor, mining.    The patient or caregiver noted above participated in the development of this care plan and knows that they can request review of or adjustments to the care plan at any time.      All of the patient's questions and concerns have been addressed.    Harlene DELENA Elder, PharmD   [facility] Specialty Pharmacist       [1]   Current Outpatient Medications   Medication Sig Dispense Refill    acetaminophen (TYLENOL) 500 MG tablet Take 2 tablets (1,000 mg total) by mouth every four (4) hours as needed for pain.      adalimumab -ryvk (SIMLANDI ,CF, AUTOINJECTOR) 40 mg/0.4 mL atIK Inject the contents of 1 pen (40 mg) under the skin every seven (7) days. 4 each 3    buPROPion (WELLBUTRIN XL) 150 MG 24 hr tablet Take 1 tablet (150 mg total) by mouth daily.      cholestyramine  (CHOLESTYRAMINE  LIGHT) 4 gram PwPk Take 1 packet by mouth daily. 30 packet 11    desipramine  (NORPRAMIN ) 25 MG tablet TAKE 6 TABLETS (150 MG TOTAL) BY MOUTH DAILY. (Patient not taking: Reported on 08/22/2023) 540 tablet 2    dextroamphetamine-amphetamine (ADDERALL XR) 20 MG 24 hr capsule Take 1 capsule (20 mg total) by mouth daily.      dextroamphetamine-amphetamine (ADDERALL) 10 mg tablet TAKE 1 TABLET DAILY AFTER LUNCH.      escitalopram oxalate (LEXAPRO) 5 MG tablet Take 1 tablet (5 mg total) by mouth daily. TAKE 1 TABLET (5 MG TOTAL) BY MOUTH DAILY.      fluticasone (FLONASE) 50 mcg/actuation nasal spray 1 SPRAY BY EACH NARE ROUTE DAILY. AS DIRECTED (Patient taking differently: 1 spray by Each Nare route daily as needed. As directed) 16 mL 2    HUMIRA  PEN CITRATE FREE 40 MG/0.4 ML Inject 1 pen (40 mg total) under the skin every seven (7) days. 12 each 3    MULTIVIT WITH CALCIUM,IRON,MIN (WOMEN'S DAILY MULTIVITAMIN ORAL) Take 1 tablet by mouth once daily.      ondansetron (ZOFRAN-ODT) 8 MG disintegrating tablet Take 1 tablet (8 mg total) by mouth every eight (8) hours as needed.      polyethylene glycol (CLEARLAX) 17 gram/dose powder Take as directed for split prep. 238 g 0    polyethylene glycol (CLEARLAX) 17 gram/dose powder Take as directed for split prep. 119 g 0    predniSONE (DELTASONE) 10 mg tablet pack Take by mouth daily. 6 day dose pack for hurt arm. (Patient not taking: Reported on 08/22/2023)      simethicone  (MYLICON) 125 MG chewable tablet Take 2 tablets (250mg  total) with prep as directed. 4 tablet 0    sulfamethoxazole -trimethoprim  (BACTRIM  DS) 800-160 mg per tablet Take 1 tablet (160 mg of trimethoprim  total) by mouth every twelve (12) hours.       No current facility-administered medications for this visit.   [2]   Allergies  Allergen Reactions    Oxycodone  (Bulk) Nausea And Vomiting     Patient tolerates acetaminophen well

## 2024-01-24 MED FILL — HUMIRA PEN CITRATE FREE 40 MG/0.4 ML: SUBCUTANEOUS | 28 days supply | Qty: 4 | Fill #1

## 2024-01-25 ENCOUNTER — Encounter: Payer: Self-pay | Admitting: Family Medicine

## 2024-01-25 ENCOUNTER — Ambulatory Visit: Admitting: Family Medicine

## 2024-01-25 VITALS — BP 104/62 | HR 45 | Temp 98.0°F | Ht 65.0 in | Wt 232.7 lb

## 2024-01-25 DIAGNOSIS — E782 Mixed hyperlipidemia: Secondary | ICD-10-CM

## 2024-01-25 DIAGNOSIS — F902 Attention-deficit hyperactivity disorder, combined type: Secondary | ICD-10-CM | POA: Diagnosis not present

## 2024-01-25 DIAGNOSIS — N289 Disorder of kidney and ureter, unspecified: Secondary | ICD-10-CM | POA: Diagnosis not present

## 2024-01-25 DIAGNOSIS — F419 Anxiety disorder, unspecified: Secondary | ICD-10-CM

## 2024-01-25 DIAGNOSIS — E559 Vitamin D deficiency, unspecified: Secondary | ICD-10-CM | POA: Diagnosis not present

## 2024-01-25 LAB — LIPID PANEL
Cholesterol: 223 mg/dL — ABNORMAL HIGH (ref 0–200)
HDL: 71.5 mg/dL (ref >39.00)
LDL Cholesterol: 134 mg/dL — ABNORMAL HIGH (ref 0–99)
NonHDL: 151.32
Total CHOL/HDL Ratio: 3
Triglycerides: 88 mg/dL (ref 0.0–149.0)
VLDL: 17.6 mg/dL (ref 0.0–40.0)

## 2024-01-25 LAB — COMPREHENSIVE METABOLIC PANEL WITH GFR
ALT: 15 U/L (ref 0–35)
AST: 18 U/L (ref 0–37)
Albumin: 4.2 g/dL (ref 3.5–5.2)
Alkaline Phosphatase: 55 U/L (ref 39–117)
BUN: 18 mg/dL (ref 6–23)
CO2: 29 meq/L (ref 19–32)
Calcium: 9.4 mg/dL (ref 8.4–10.5)
Chloride: 100 meq/L (ref 96–112)
Creatinine, Ser: 0.73 mg/dL (ref 0.40–1.20)
GFR: 93.76 mL/min (ref >60.00)
Glucose, Bld: 85 mg/dL (ref 70–99)
Potassium: 4.1 meq/L (ref 3.5–5.1)
Sodium: 137 meq/L (ref 135–145)
Total Bilirubin: 0.4 mg/dL (ref 0.2–1.2)
Total Protein: 7.5 g/dL (ref 6.0–8.3)

## 2024-01-25 LAB — VITAMIN D 25 HYDROXY (VIT D DEFICIENCY, FRACTURES): VITD: 24.65 ng/mL — ABNORMAL LOW (ref 30.00–100.00)

## 2024-01-25 MED ORDER — AMPHETAMINE-DEXTROAMPHETAMINE 10 MG PO TABS: 10.0000 mg | ORAL_TABLET | Freq: Every day | ORAL | 0 refills | Status: DC

## 2024-01-25 MED ORDER — AMPHETAMINE-DEXTROAMPHET ER 20 MG PO CP24: 20.0000 mg | ORAL_CAPSULE | Freq: Every day | ORAL | 0 refills | Status: DC

## 2024-01-25 MED ORDER — ESCITALOPRAM OXALATE 20 MG PO TABS: 20.0000 mg | ORAL_TABLET | Freq: Every day | ORAL | 1 refills | Status: AC

## 2024-01-25 NOTE — Patient Instructions (Addendum)
 Call the breast center to schedule your mammogram  Tetanus booster is due -- may go to any pharmacy in the area.

## 2024-01-25 NOTE — Assessment & Plan Note (Signed)
 Pt has been off her stimulants since last year, will restart her adderall doses, I reminded patient that the DEA recommends visits every 90 days for refills and she voiced understanding. Will also check labs to follow up on her cholesterol and vitamin deficiencies.

## 2024-01-25 NOTE — Progress Notes (Signed)
 Established Patient Office Visit  Subjective   Patient ID: Katrina Grant, female    DOB: 08-02-1970  Age: 53 y.o. MRN: [id number]  Chief Complaint  Patient presents with   Medical Management of Chronic Issues    Pt is here for follow up today on her ADHD and other medical issues.  ADHD-- pt reports that since she has been off of her adderall she has had more difficulty with focusing. States she took some time off from working and has been caring for her elderly mother. 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 States that the 20 mg xr and the 10 mg in the afternoon was helping her a lot. Is still taking the 300 mg daily of wellbutrin , states that it helps her with her mood however not so much with her ADHD symptoms.   Reviewed labs with patient as well as health maintenance, she was encouraged to call the breast center to schedule her mammogram which was already ordered. She needs follow up labs on her vitamin D  and cholesterol.     Current Outpatient Medications  Medication Instructions   acetaminophen  (TYLENOL ) 1,000 mg, Every 6 hours PRN   adalimumab  (HUMIRA ) 40 mg, Weekly   amphetamine -dextroamphetamine  (ADDERALL XR) 20 MG 24 hr capsule 20 mg, Oral, Daily   amphetamine -dextroamphetamine  (ADDERALL) 10 MG tablet 10 mg, Oral, Daily after lunch   buPROPion  (WELLBUTRIN  XL) 300 mg, Oral, Daily after breakfast   diclofenac  Sodium (VOLTAREN ) 2 g, Topical, 4 times daily   escitalopram  (LEXAPRO ) 20 mg, Oral, Daily   ondansetron  (ZOFRAN  ODT) 8 mg, Oral, Every 8 hours PRN   Prenatal Vit-Fe Fumarate-FA (M-NATAL PLUS ) 27-1 MG TABS TAKE 1 TABLET BY MOUTH EVERY DAY BEFORE BREAKFAST    Patient Active Problem List   Diagnosis Date Noted   Anxiety disorder 02/01/2022   Recurrent UTI 02/01/2022   ADD (attention deficit disorder) 10/08/2021   BMI 38.0-38.9,adult 11/24/2016   Crohn's disease with complication  (HCC) 11/20/2013   Fibromyalgia - managed by Dr. Leni, [facility] - takes Neurontin and Tramadol  11/20/2013   DUB (dysfunctional uterine bleeding) 03/12/2011      Review of Systems  All other systems reviewed and are negative.     Objective:     BP 104/62   Pulse (!) 45   Temp 98 F (36.7 C) (Oral)   Ht 5' 5 (1.651 m)   Wt 232 lb 11.2 oz (105.6 kg)   LMP 12/24/2020 (Approximate)   SpO2 99%   BMI 38.72 kg/m    Physical Exam Vitals reviewed.  Constitutional:      Appearance: Normal appearance. She is well-groomed. She is obese.  Cardiovascular:     Rate and Rhythm: Normal rate and regular rhythm.     Pulses: Normal pulses.     Heart sounds: S1 normal and S2 normal.  Pulmonary:     Effort: Pulmonary effort is normal.     Breath sounds: Normal breath sounds and air entry.  Musculoskeletal:     Right lower leg: No edema.     Left lower leg: No edema.  Neurological:     Mental Status: She is alert and oriented to person, place, and time. Mental status is at baseline.     Gait: Gait is intact.  Psychiatric:        Mood and Affect: Mood and affect normal.  Speech: Speech normal.        Behavior: Behavior normal.        Judgment: Judgment normal.      No results found for any visits on 01/25/24.    The 10-year ASCVD risk score (Arnett DK, et al., 2019) is: 1.1%    Assessment & Plan:  Attention deficit hyperactivity disorder (ADHD), combined type Assessment & Plan: Pt has been off her stimulants since last year, will restart her adderall doses, I reminded patient that the DEA recommends visits every 90 days for refills and she voiced understanding. Will also check labs to follow up on her cholesterol and vitamin deficiencies.   Orders: -     Amphetamine -Dextroamphet ER; Take 1 capsule (20 mg total) by mouth daily.  Dispense: 30 capsule; Refill: 0 -     Amphetamine -Dextroamphetamine ; Take 1 tablet (10 mg total) by mouth daily after lunch.   Dispense: 30 tablet; Refill: 0  Vitamin D  deficiency -     VITAMIN D  25 Hydroxy (Vit-D Deficiency, Fractures); Future  Mixed hyperlipidemia -     Lipid panel; Future  Abnormal kidney function -     Comprehensive metabolic panel with GFR; Future  Anxiety disorder, unspecified type -     Escitalopram  Oxalate; Take 1 tablet (20 mg total) by mouth daily.  Dispense: 90 tablet; Refill: 1   I personally spent a total of 30 minutes in the care of the patient today including getting/reviewing separately obtained history, performing a medically appropriate exam/evaluation, counseling and educating, placing orders, independently interpreting results, and communicating results.   Return in about 3 months (around 04/26/2024) for ADHD med refills-- ok to be a video visit.    Heron CHRISTELLA Sharper, MD

## 2024-01-30 ENCOUNTER — Ambulatory Visit: Payer: Self-pay | Admitting: Family Medicine

## 2024-01-30 DIAGNOSIS — E559 Vitamin D deficiency, unspecified: Secondary | ICD-10-CM

## 2024-01-30 MED ORDER — VITAMIN D (ERGOCALCIFEROL) 1.25 MG (50000 UNIT) PO CAPS: 50000.0000 [IU] | ORAL_CAPSULE | ORAL | 1 refills | Status: DC

## 2024-02-12 NOTE — Unmapped (Unsigned)
 [facility]   FOLLOWUP NOTE - INFLAMMATORY BOWEL DISEASE  02/12/2024    Demographics:  Jasmine Mitchell is a 53 y.o. year old female    Diagnosis:  Crohn's Disease  Disease onset (yr): 35  Age at onset:   < 11 yr old (A1)  Location:  Ileocolonic (L3) (mostly colonic)  Behavior:  Perianal (P), Stricturing (B2)  Current Tight Control Scenario:   Maintenance = Biologic          HPI / NOTE :     Interval Events:   1.  Last seen by March 2025 - at that time, she had a 1 month gap in Humira  and had some breakthrough symptoms.  We decided to resume Humira  and use a short Entocort taper.  She was off desipramine  at that time, but was taking combination of Wellbutrin and Lexapro prescribed by her PCP.  She had multiple life stressors ongoing at that time.   2.  Last labs march 2025 -CBC was not done.  CMP unremarkable.  ESR mildly elevated at 31, CRP normal, iron levels and B12 normal.   3.  May 2025, lost insurance and hence changed to Humira  through manufactures assistance program.  We also started cholestyramine  1 packet daily for likely bile acid diarrhea.    HPI:  ****    Abdominal pain (0-10): none  BM a day: ~2-4x/day  Consistency: formed  % of stools have blood: 0%  Nocturnal BM: 1-2  Urgency:  mild - only w/ IBS flare  Weight change over last 6 mo: stable  Smoking:  no  NSAIDS: avoids    Review of Systems:   Review of systems positive for: negative except as above.   Otherwise, the balance of 10 systems is negative.          IBD HISTORY:     Brief IBD Disease Course:    - 2 - dx with Crohn's dz ~age 79, sx of diarrhea, abd pain, rectal pain and bloody stools.  Treated with sulfasalazine, Asacol, Pentasa without benefit.  Got courses of steroids and antibiotics on and off.  Tried on but had nausea.   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    - 2004 - Remicade x 9 months. Stopped in 2005 due to 2ndary loss of response.   - 2005 - developed a pyoderma lesion.   - 2010 - Re-established care with Dr. Lehman.  Concern for internal fistulizing disease.  Was on Cimzia. Had surgery for perianal disease.   - 2011 - Started on Humira .   - 2012 - active perianal disease with a large abscess.  Required EUA, drainage, setons ([place]).   - 2013 - April, dx with pelvic floor dyssynergia and anal sphincter weakness, underwent biofeedback x 2 courses ([place] [facility] [place]).   - 2014 - on - doing well on Humira  and long term Cipro.  Having on and off bowel symptoms symptoms which have been thought to be related to intermittent IBS symptoms.   - 2023 - Remains on Humira  weekly, uptitrating desipramine  and levsin  PRN for IBS symptoms.   - 2025 - had a ~1 month gap in Humira , mild breakthrough symptoms, resuming Humira  + short entocort taper    Endoscopy:      - Colonoscopy 06/17/2003: patch inflammation and ulcers throughout the colon.    - Colonoscopy 01/2009:  Active crohn's at IC valve. Pseudopolyps.  Retraction of IC valve. ?Rectosigmoid fistula tract.  Otherwise normal colon.   - Colonoscopy  10/08/2009:  Perianal fistula, pseudopolyps.  Few aphthae in TI.   - Colonoscopy 07/29/2010:  Poor colon prep, pseudopolyps, stricture at IC valve.  Few ulcers at IC valve. Rectal stricture. Ileal crohn's disease.   - Colonoscopy 05/05/2011:  Pseduopolyps throughout.  4mm cecal polyp. Large fistula in sigmoid colon. Normal TI.   - Colonoscopy 09/06/2012:  Healed scars from prior fistulotomies on perianal exam. Scar in ascending colon. No active Crohn's.   - Colonoscopy 10/24/13:  Pseudopolyps in rectosigmoid, otherwise normal colon and TI.   - Colonoscopy 10/01/14:  Pseudopolyps in rectum, transverse and ascending colon.  Otherwise normal colon.  Granular TI.  Fixed and open IC valve (not stenotic).   - Colonoscopy 08/12/16:  Mild anal canal stenosis on exam, otherwise normal colon and ileum.     PATH = chronic quiescent colitis (left and right colon) no dysplasia  - Colonoscopy 01/28/2019 - mild anal stricture (dilated to 20mm via [place]), otherwise normal colon and ileum with no active inflammation  - Colonoscopy 06/27/2022 - rectal stricture on exam, dilation to 20mm. One 4mm polyp in the sigmoid colon. Internal hemorrhoids. Otherwise normal colon with no active Crohn's disease.    PATH:  Normal colonic mucosa (left and right colon).      Imaging:    - CT A/P 10/24/2013: mild colonic wall thickening at splenic/descending colon, no other bowel inflammation or stranding.  *Patchy groundglass opacity in left lung base.     Prior IBD medications (type, dose, duration, response):  x 5-ASAs - Sulfasalazine, Asacol, Pentasa  x Oral corticosteroids - Prednisone  []  Intravenous corticosteroids  x Antibiotics - Flagyl - nausea.  Cipro - tolerates well and it helps.   x Thiopurines - - nausea.    TPMT - normal 07/25/17  x Methotrexate - 2005.   x Anti-TNF therapies - Remicade 2004 - 2005 (~6 doses).  Had gradual loss of response so stopped. Tried on Remicade again in 2009 with some effect, increased to 10mg /kg q4 weeks. Cimzia - started 09/2008.  Humira  2014 - dose increased to 80mg  q2 weeks.  04/2016 - humira  level 5.6, Ab negative.   []  Cyclosporine  []  Clinical trial medication  []  Other (Please specify):    Extraintestinal manifestations:   -joint pains affecting: n  -eye: n  -skin: n  -oral ulcers :  n  -blood clots: n  -PSC: n  -other: n          Past Medical History:   Past medical history:   Past Medical History:   Diagnosis Date    Acid reflux     Arthritis     Crohn disease        Fibromyalgia     UTI (urinary tract infection)      Past surgical history:   Past Surgical History:   Procedure Laterality Date    FRACTURE SURGERY      left wrist    GASTROCUTANEOUS FISTULA CLOSURE      ORTHOPEDIC SURGERY      PR COLONOSCOPY FLX DX W/COLLJ SPEC WHEN PFRMD N/A 10/24/2013    Procedure: COLONOSCOPY, FLEXIBLE, PROXIMAL TO SPLENIC FLEXURE; DIAGNOSTIC, W/WO COLLECTION SPECIMEN BY BRUSH OR WASH;  Surgeon: Dorn JINNY Lauth, MD;  [facility];  Service: Gastroenterology    PR COLONOSCOPY W/BIOPSY SINGLE/MULTIPLE N/A 10/01/2014    Procedure: COLONOSCOPY, FLEXIBLE, PROXIMAL TO SPLENIC FLEXURE; WITH BIOPSY, SINGLE OR MULTIPLE;  Surgeon: Luke LITTIE Notch, MD;  [facility];  Service: Gastroenterology  PR COLONOSCOPY W/BIOPSY SINGLE/MULTIPLE N/A 08/12/2016    Procedure: COLONOSCOPY, FLEXIBLE, PROXIMAL TO SPLENIC FLEXURE; WITH BIOPSY, SINGLE OR MULTIPLE;  Surgeon: Rodger Phlegm, MD;  [facility];  Service: Gastroenterology    PR COLONOSCOPY W/BIOPSY SINGLE/MULTIPLE Left 01/28/2019    Procedure: COLONOSCOPY, FLEXIBLE, PROXIMAL TO SPLENIC FLEXURE; WITH BIOPSY, SINGLE OR MULTIPLE;  Surgeon: Rodger Phlegm, MD;  Location: [facility];  Service: Gastroenterology    PR COLONOSCOPY W/BIOPSY SINGLE/MULTIPLE Left 06/27/2022    Procedure: COLONOSCOPY, FLEXIBLE, PROXIMAL TO SPLENIC FLEXURE; WITH BIOPSY, SINGLE OR MULTIPLE;  Surgeon: Phlegm Rodger, MD;  Location: [facility];  Service: Gastroenterology    PR COLSC FLX W/RMVL OF TUMOR POLYP LESION SNARE TQ Left 01/28/2019    Procedure: COLONOSCOPY FLEX; W/REMOV TUMOR/LES BY SNARE;  Surgeon: Rodger Phlegm, MD;  Location: [facility];  Service: Gastroenterology    PR COLSC FLX W/RMVL OF TUMOR POLYP LESION SNARE TQ N/A 06/27/2022    Procedure: COLONOSCOPY FLEX; W/REMOV TUMOR/LES BY SNARE;  Surgeon: Phlegm Rodger, MD;  [facility];  Service: Gastroenterology    PR DILATION RECTAL STRICTURE W ANEST Left 06/27/2022    Procedure: DILATION OF RECTAL STRICTURE (SEPARATE PROCEDURE) UNDER ANESTHESIA OTHER THAN LOCAL;  Surgeon: Phlegm Rodger, MD;  Location: [facility];  Service: Gastroenterology    PR SURG DIAGNOSTIC EXAM, ANORECTAL N/A 12/23/2016    Procedure: ANORECTAL EXAM, SURGICAL, REQUIRING ANESTHESIA (GENERAL, SPINAL, OR EPIDURAL), DIAGNOSTIC;  Surgeon: Evalene Karalee Shilling, MD;  Location: MAIN OR [place];  Service: Gastrointestinal    PR UPPER GI ENDOSCOPY,DIAGNOSIS N/A 08/12/2016    Procedure: UGI ENDO, INCLUDE ESOPHAGUS, STOMACH, & DUODENUM &/OR JEJUNUM; DX W/WO COLLECTION SPECIMN, BY BRUSH OR WASH;  Surgeon: Rodger Phlegm, MD;  Location: [facility];  Service: Gastroenterology    TUBAL LIGATION       Family history:   Family History   Problem Relation Age of Onset    Cancer Sister     Cancer Paternal Grandmother     Colorectal Cancer Neg Hx      Social history:   Social History     Socioeconomic History    Marital status: Married   Tobacco Use    Smoking status: Never    Smokeless tobacco: Never   Vaping Use    Vaping status: Never Used   Substance and Sexual Activity    Alcohol use: Yes     Alcohol/week: 0.0 standard drinks of alcohol     Comment: socially     Drug use: No     Social Drivers of Psychologist, prison and probation services Strain: Low Risk  (11/22/2021)    Received from [facility]    Overall Financial Resource Strain (CARDIA)     Difficulty of Paying Living Expenses: Not very hard   Food Insecurity: No Food Insecurity (11/22/2021)    Received from [facility]    Hunger Vital Sign     Worried About Running Out of Food in the Last Year: Never true     Ran Out of Food in the Last Year: Never true   Transportation Needs: No Transportation Needs (11/22/2021)    Received from [facility] - Transportation     Lack of Transportation (Medical): No     Lack of Transportation (Non-Medical): No   Physical Activity: Unknown (11/22/2021)    Received from [facility]    Exercise Vital Sign     Days  of Exercise per Week: 0 days   Stress: No Stress Concern Present (11/22/2021)    Received from [facility] - Occupational Stress Questionnaire     Feeling of Stress : Not at all   Social Connections: Unknown (11/22/2021)    Received from [facility]: Lambert Rhonda W    Social Connection and Isolation Panel     Frequency of Communication with Friends and Family: Three times a week     Frequency of Social Gatherings with Friends and Family: Once a week     Attends Religious Services: Patient declined     Database administrator or Organizations: Patient declined     Marital Status: Married             Allergies:     Allergies   Allergen Reactions    Oxycodone  (Bulk) Nausea And Vomiting     Patient tolerates acetaminophen well             Medications:     Current Outpatient Medications   Medication Sig Dispense Refill    acetaminophen (TYLENOL) 500 MG tablet Take 2 tablets (1,000 mg total) by mouth every four (4) hours as needed for pain.      buPROPion (WELLBUTRIN XL) 150 MG 24 hr tablet Take 1 tablet (150 mg total) by mouth daily.      cholestyramine  (CHOLESTYRAMINE  LIGHT) 4 gram PwPk Take 1 packet by mouth daily. 30 packet 11    desipramine  (NORPRAMIN ) 25 MG tablet TAKE 6 TABLETS (150 MG TOTAL) BY MOUTH DAILY. (Patient not taking: Reported on 08/22/2023) 540 tablet 2    dextroamphetamine-amphetamine (ADDERALL XR) 20 MG 24 hr capsule Take 1 capsule (20 mg total) by mouth daily.      dextroamphetamine-amphetamine (ADDERALL) 10 mg tablet TAKE 1 TABLET DAILY AFTER LUNCH.      escitalopram oxalate (LEXAPRO) 5 MG tablet Take 1 tablet (5 mg total) by mouth daily. TAKE 1 TABLET (5 MG TOTAL) BY MOUTH DAILY.      fluticasone (FLONASE) 50 mcg/actuation nasal spray 1 SPRAY BY EACH NARE ROUTE DAILY. AS DIRECTED (Patient taking differently: 1 spray by Each Nare route daily as needed. As directed) 16 mL 2    HUMIRA  PEN CITRATE FREE 40 MG/0.4 ML Inject 1 pen (40 mg total) under the skin every seven (7) days. 12 each 3    MULTIVIT WITH CALCIUM,IRON,MIN (WOMEN'S DAILY MULTIVITAMIN ORAL) Take 1 tablet by mouth once daily.      ondansetron (ZOFRAN-ODT) 8 MG disintegrating tablet Take 1 tablet (8 mg total) by mouth every eight (8) hours as needed.      polyethylene glycol (CLEARLAX) 17 gram/dose powder Take as directed for split prep. 238 g 0    polyethylene glycol (CLEARLAX) 17 gram/dose powder Take as directed for split prep. 119 g 0    predniSONE (DELTASONE) 10 mg tablet pack Take by mouth daily. 6 day dose pack for hurt arm. (Patient not taking: Reported on 08/22/2023)      simethicone  (MYLICON) 125 MG chewable tablet Take 2 tablets (250mg  total) with prep as directed. 4 tablet 0    sulfamethoxazole -trimethoprim  (BACTRIM  DS) 800-160 mg per tablet Take 1 tablet (160 mg of trimethoprim  total) by mouth every twelve (12) hours.       No current facility-administered medications for this visit.           Physical Exam:   LMP 12/02/2016 (Approximate)   ****  GEN: no apparent distress, appears comfortable  on exam  HEENT: OP clear with no erythema, lesions, exudate, mucous membranes moist  NECK: Supple, no lymphadenopathy  LUNGS: CTAB, no wheezes, rales, or rhonchi  CV: S1/S2, RRR, no murmurs  ABD: Soft, nontender, no rebound/guarding, nondistended, normoactive bowel sounds, no appreciable organomegaly  Extremities: no cyanosis, clubbing or edema, normal gait  Psych: affect appropriate, A&O x3  SKIN: no visible lesions on face, neck, arms, abdomen          Labs, Data & Indices:     Lab Review:   Lab Results   Component Value Date    WBC 4.9 05/17/2022    WBC 6.5 04/29/2016    RBC 4.31 05/17/2022    RBC 4.50 04/29/2016    HGB 13.3 05/17/2022    HGB 13.2 04/29/2016     Lab Results   Component Value Date    AST 18 08/22/2023    AST 13 04/29/2016    ALT 12 08/22/2023    ALT 9 04/29/2016    BUN 14 08/22/2023    BUN 11 04/29/2016    Creatinine 0.86 08/22/2023    Creatinine 0.80 04/29/2016    CO2 26.6 08/22/2023    CO2 21 04/29/2016    Albumin 4.0 08/22/2023    Albumin 4.3 08/19/2014    Calcium 9.7 08/22/2023    Calcium 9.4 04/29/2016     Lab Results   Component Value Date    TSH 2.810 04/29/2016      ...........................................................................................................................................SABRA   Diagnosis ICD-10-CM Associated Orders   1. Crohn's disease of small intestine without complication      K50.00       2. Immunosuppression due to drug therapy (HHS-HCC)  D84.821     Z79.899       3. Irritable bowel syndrome with diarrhea  K58.0                 Assessment & Recommendations:   Disease state:  Ileocolonic Crohn's with perianal disease and anorectal stricture    Selda Jalbert is a 53 y.o. female with a long standing hx of stricturing ileocolonic Crohn's disease.  She also has hx of perianal disease (last requiring drainage by Dr. Fleurette 2018).  She was well maintained in clinical and endoscopic remission on on weekly Humira  monotherapy. However due to insurance issues she has had a month-long gap in treatment. She is now having some symptoms of recurrent colonic Crohn's disease. I think the symptoms are due to delay in Humira  and we will plan to restart TNF (without re-loading due to overall mild nature of symptoms and relatively short duration off therapy).  We will also use a short budesonide  taper given the active symptoms and check labs today. If she has a good response we will continue Humira  monotherapy as planned (and will be due for colonoscopy ~2027).  If she does not have a good response to Humira , we will evaluate further including Humira  trough level and likely colonoscopy. ****    PLAN:  ***    IBD health maintenance:  Influenza vaccine: 2012, 117/17, 04/2022  Pneumonia vaccine: Prevnar 02/2014; Pneumovax 3//08/2018  COVID Vaccine:  Pfizer April 2021 x 2 doses, 3rd shot 04/2020  Hepatitis B:   TB testing:   Chickenpox/Shingles history: Shingrix #18 November 2021, #2 04/2022  Bone denistometry:   Derm appointment:  Last small bowel imaging:   Last colonoscopy: 01/2019  PAP smear:   --------------------------------------------  Author: Rodger Phlegm, MD 02/12/2024 3:18 PM    Rodger Phlegm, MD  Associate Professor of Medicine  Division of Gastroenterology & Hepatology  [employer]  [facility]  =========================================

## 2024-02-14 NOTE — Unmapped (Signed)
 PRE-VISIT PLANNING NOTE    [facility]   FOLLOWUP NOTE - INFLAMMATORY BOWEL DISEASE  02/14/2024    Demographics:  Jasmine Mitchell is a 53 y.o. year old female    Diagnosis:  Crohn's Disease  Disease onset (yr): 9  Age at onset:   < 21 yr old (A1)  Location:  Ileocolonic (L3) (mostly colonic)  Behavior:  Perianal (P), Stricturing (B2)  Current Tight Control Scenario:   Maintenance = Biologic          HPI / NOTE :     Interval Events:   1.  Last seen by March 2025 - at that time, she had a 1 month gap in Humira  and had some breakthrough symptoms.  We decided to resume Humira  and use a short Entocort taper.  She was off desipramine  at that time, but was taking combination of Wellbutrin and Lexapro prescribed by her PCP.  She had multiple life stressors ongoing at that time.   2.  Last labs march 2025 -CBC was not done.  CMP unremarkable.  ESR mildly elevated at 31, CRP normal, iron levels and B12 normal.   3.  May 2025, lost insurance and hence changed to Humira  through manufactures assistance program.  We also started cholestyramine  1 packet daily for likely bile acid diarrhea.    HPI:    PRE-VISIT PLANNING NOTE    Abdominal pain (0-10): none  BM a day: ~2-4x/day  Consistency: formed  % of stools have blood: 0%  Nocturnal BM: 1-2  Urgency:  mild - only w/ IBS flare  Weight change over last 6 mo: stable  Smoking:  no  NSAIDS: avoids    Review of Systems:   Review of systems positive for: negative except as above.   Otherwise, the balance of 10 systems is negative.          IBD HISTORY:     Brief IBD Disease Course:    - 64 - dx with Crohn's dz ~age 77, sx of diarrhea, abd pain, rectal pain and bloody stools.  Treated with sulfasalazine, Asacol, Pentasa without benefit.  Got courses of steroids and antibiotics on and off.  Tried on but had nausea.   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    - 2004 - Remicade x 9 months. Stopped in 2005 due to 2ndary loss of response.   - 2005 - developed a pyoderma lesion.   - 2010 - Re-established care with Dr. Lehman.  Concern for internal fistulizing disease.  Was on Cimzia. Had surgery for perianal disease.   - 2011 - Started on Humira .   - 2012 - active perianal disease with a large abscess.  Required EUA, drainage, setons ([place]).   - 2013 - April, dx with pelvic floor dyssynergia and anal sphincter weakness, underwent biofeedback x 2 courses ([facility] [place]).   - 2014 - on - doing well on Humira  and long term Cipro.  Having on and off bowel symptoms symptoms which have been thought to be related to intermittent IBS symptoms.   - 2023 - Remains on Humira  weekly, uptitrating desipramine  and levsin  PRN for IBS symptoms.   - 2025 - had a ~1 month gap in Humira , mild breakthrough symptoms, resuming Humira  + short entocort taper    Endoscopy:      - Colonoscopy 06/17/2003: patch inflammation and ulcers throughout the colon.    - Colonoscopy 01/2009:  Active crohn's at IC valve. Pseudopolyps.  Retraction of IC valve. ?Rectosigmoid  fistula tract.  Otherwise normal colon.   - Colonoscopy 10/08/2009:  Perianal fistula, pseudopolyps.  Few aphthae in TI.   - Colonoscopy 07/29/2010:  Poor colon prep, pseudopolyps, stricture at IC valve.  Few ulcers at IC valve. Rectal stricture. Ileal crohn's disease.   - Colonoscopy 05/05/2011:  Pseduopolyps throughout.  4mm cecal polyp. Large fistula in sigmoid colon. Normal TI.   - Colonoscopy 09/06/2012:  Healed scars from prior fistulotomies on perianal exam. Scar in ascending colon. No active Crohn's.   - Colonoscopy 10/24/13:  Pseudopolyps in rectosigmoid, otherwise normal colon and TI.   - Colonoscopy 10/01/14:  Pseudopolyps in rectum, transverse and ascending colon.  Otherwise normal colon.  Granular TI.  Fixed and open IC valve (not stenotic).   - Colonoscopy 08/12/16:  Mild anal canal stenosis on exam, otherwise normal colon and ileum.     PATH = chronic quiescent colitis (left and right colon) no dysplasia  - Colonoscopy 01/28/2019 - mild anal stricture (dilated to 20mm via Hegar), otherwise normal colon and ileum with no active inflammation  - Colonoscopy 06/27/2022 - rectal stricture on exam, dilation to 20mm. One 4mm polyp in the sigmoid colon. Internal hemorrhoids. Otherwise normal colon with no active Crohn's disease.    PATH:  Normal colonic mucosa (left and right colon).      Imaging:    - CT A/P 10/24/2013: mild colonic wall thickening at splenic/descending colon, no other bowel inflammation or stranding.  *Patchy groundglass opacity in left lung base.     Prior IBD medications (type, dose, duration, response):  x 5-ASAs - Sulfasalazine, Asacol, Pentasa  x Oral corticosteroids - Prednisone  []  Intravenous corticosteroids  x Antibiotics - Flagyl - nausea.  Cipro - tolerates well and it helps.   x Thiopurines - - nausea.    TPMT - normal 07/25/17  x Methotrexate - 2005.   x Anti-TNF therapies - Remicade 2004 - 2005 (~6 doses).  Had gradual loss of response so stopped. Tried on Remicade again in 2009 with some effect, increased to 10mg /kg q4 weeks. Cimzia - started 09/2008.  Humira  2014 - dose increased to 80mg  q2 weeks.  04/2016 - humira  level 5.6, Ab negative.   []  Cyclosporine  []  Clinical trial medication  []  Other (Please specify):    Extraintestinal manifestations:   -joint pains affecting: n  -eye: n  -skin: n  -oral ulcers :  n  -blood clots: n  -PSC: n  -other: n          Past Medical History:   Past medical history:   Past Medical History:   Diagnosis Date    Acid reflux     Arthritis     Crohn disease        Fibromyalgia     UTI (urinary tract infection)      Past surgical history:   Past Surgical History:   Procedure Laterality Date    FRACTURE SURGERY      left wrist    GASTROCUTANEOUS FISTULA CLOSURE      ORTHOPEDIC SURGERY      PR COLONOSCOPY FLX DX W/COLLJ SPEC WHEN PFRMD N/A 10/24/2013    Procedure: COLONOSCOPY, FLEXIBLE, PROXIMAL TO SPLENIC FLEXURE; DIAGNOSTIC, W/WO COLLECTION SPECIMEN BY BRUSH OR WASH;  Surgeon: Dorn JINNY Lauth, MD;  [facility];  Service: Gastroenterology    PR COLONOSCOPY W/BIOPSY SINGLE/MULTIPLE N/A 10/01/2014    Procedure: COLONOSCOPY, FLEXIBLE, PROXIMAL TO SPLENIC FLEXURE; WITH BIOPSY, SINGLE OR MULTIPLE;  Surgeon: Luke LITTIE Notch,  MD;  Location: [facility] [place];  Service: Gastroenterology    PR COLONOSCOPY W/BIOPSY SINGLE/MULTIPLE N/A 08/12/2016    Procedure: COLONOSCOPY, FLEXIBLE, PROXIMAL TO SPLENIC FLEXURE; WITH BIOPSY, SINGLE OR MULTIPLE;  Surgeon: Rodger Phlegm, MD;  Location: [facility];  Service: Gastroenterology    PR COLONOSCOPY W/BIOPSY SINGLE/MULTIPLE Left 01/28/2019    Procedure: COLONOSCOPY, FLEXIBLE, PROXIMAL TO SPLENIC FLEXURE; WITH BIOPSY, SINGLE OR MULTIPLE;  Surgeon: Rodger Phlegm, MD;  Location: [facility];  Service: Gastroenterology    PR COLONOSCOPY W/BIOPSY SINGLE/MULTIPLE Left 06/27/2022    Procedure: COLONOSCOPY, FLEXIBLE, PROXIMAL TO SPLENIC FLEXURE; WITH BIOPSY, SINGLE OR MULTIPLE;  Surgeon: Phlegm Rodger, MD;  [facility];  Service: Gastroenterology    PR COLSC FLX W/RMVL OF TUMOR POLYP LESION SNARE TQ Left 01/28/2019    Procedure: COLONOSCOPY FLEX; W/REMOV TUMOR/LES BY SNARE;  Surgeon: Rodger Phlegm, MD;  Location: [facility];  Service: Gastroenterology    PR COLSC FLX W/RMVL OF TUMOR POLYP LESION SNARE TQ N/A 06/27/2022    Procedure: COLONOSCOPY FLEX; W/REMOV TUMOR/LES BY SNARE;  Surgeon: Phlegm Rodger, MD;  Location: [facility];  Service: Gastroenterology    PR DILATION RECTAL STRICTURE W ANEST Left 06/27/2022    Procedure: DILATION OF RECTAL STRICTURE (SEPARATE PROCEDURE) UNDER ANESTHESIA OTHER THAN LOCAL;  Surgeon: Phlegm Rodger, MD;  Location: [facility];  Service: Gastroenterology    PR SURG DIAGNOSTIC EXAM, ANORECTAL N/A 12/23/2016    Procedure: ANORECTAL EXAM, SURGICAL, REQUIRING ANESTHESIA (GENERAL, SPINAL, OR EPIDURAL), DIAGNOSTIC;  Surgeon: Evalene Karalee Shilling, MD;  Location: MAIN OR [place];  Service: Gastrointestinal    PR UPPER GI ENDOSCOPY,DIAGNOSIS N/A 08/12/2016    Procedure: UGI ENDO, INCLUDE ESOPHAGUS, STOMACH, & DUODENUM &/OR JEJUNUM; DX W/WO COLLECTION SPECIMN, BY BRUSH OR WASH;  Surgeon: Rodger Phlegm, MD;  Location: [facility];  Service: Gastroenterology    TUBAL LIGATION       Family history:   Family History   Problem Relation Age of Onset    Cancer Sister     Cancer Paternal Grandmother     Colorectal Cancer Neg Hx      Social history:   Social History     Socioeconomic History    Marital status: Married   Tobacco Use    Smoking status: Never    Smokeless tobacco: Never   Vaping Use    Vaping status: Never Used   Substance and Sexual Activity    Alcohol use: Yes     Alcohol/week: 0.0 standard drinks of alcohol     Comment: socially     Drug use: No     Social Drivers of Psychologist, prison and probation services Strain: Low Risk  (11/22/2021)    Received from [facility]    Overall Financial Resource Strain (CARDIA)     Difficulty of Paying Living Expenses: Not very hard   Food Insecurity: No Food Insecurity (11/22/2021)    Received from [facility]    Hunger Vital Sign     Worried About Running Out of Food in the Last Year: Never true     Ran Out of Food in the Last Year: Never true   Transportation Needs: No Transportation Needs (11/22/2021)    Received from [facility] - Transportation     Lack of Transportation (Medical): No     Lack of Transportation (Non-Medical): No   Physical Activity: Unknown (11/22/2021)    Received from  [facility]    Exercise Vital Sign     Days of Exercise per Week: 0 days   Stress: No Stress Concern Present (11/22/2021)    Received from [facility] - Occupational Stress Questionnaire     Feeling of Stress : Not at all   Social Connections: Unknown (11/22/2021)    Received from [facility]    Social Connection and Isolation Panel Frequency of Communication with Friends and Family: Three times a week     Frequency of Social Gatherings with Friends and Family: Once a week     Attends Religious Services: Patient declined     Database administrator or Organizations: Patient declined     Marital Status: Married             Allergies:     Allergies   Allergen Reactions    Oxycodone  (Bulk) Nausea And Vomiting     Patient tolerates acetaminophen well             Medications:     Current Outpatient Medications   Medication Sig Dispense Refill    acetaminophen (TYLENOL) 500 MG tablet Take 2 tablets (1,000 mg total) by mouth every four (4) hours as needed for pain.      buPROPion (WELLBUTRIN XL) 150 MG 24 hr tablet Take 1 tablet (150 mg total) by mouth daily.      cholestyramine  (CHOLESTYRAMINE  LIGHT) 4 gram PwPk Take 1 packet by mouth daily. 30 packet 11    desipramine  (NORPRAMIN ) 25 MG tablet TAKE 6 TABLETS (150 MG TOTAL) BY MOUTH DAILY. (Patient not taking: Reported on 08/22/2023) 540 tablet 2    dextroamphetamine-amphetamine (ADDERALL XR) 20 MG 24 hr capsule Take 1 capsule (20 mg total) by mouth daily.      dextroamphetamine-amphetamine (ADDERALL) 10 mg tablet TAKE 1 TABLET DAILY AFTER LUNCH.      escitalopram oxalate (LEXAPRO) 5 MG tablet Take 1 tablet (5 mg total) by mouth daily. TAKE 1 TABLET (5 MG TOTAL) BY MOUTH DAILY.      fluticasone (FLONASE) 50 mcg/actuation nasal spray 1 SPRAY BY EACH NARE ROUTE DAILY. AS DIRECTED (Patient taking differently: 1 spray by Each Nare route daily as needed. As directed) 16 mL 2    HUMIRA  PEN CITRATE FREE 40 MG/0.4 ML Inject 1 pen (40 mg total) under the skin every seven (7) days. 12 each 3    MULTIVIT WITH CALCIUM,IRON,MIN (WOMEN'S DAILY MULTIVITAMIN ORAL) Take 1 tablet by mouth once daily.      ondansetron (ZOFRAN-ODT) 8 MG disintegrating tablet Take 1 tablet (8 mg total) by mouth every eight (8) hours as needed.      polyethylene glycol (CLEARLAX) 17 gram/dose powder Take as directed for split prep. 238 g 0 polyethylene glycol (CLEARLAX) 17 gram/dose powder Take as directed for split prep. 119 g 0    predniSONE (DELTASONE) 10 mg tablet pack Take by mouth daily. 6 day dose pack for hurt arm. (Patient not taking: Reported on 08/22/2023)      simethicone  (MYLICON) 125 MG chewable tablet Take 2 tablets (250mg  total) with prep as directed. 4 tablet 0    sulfamethoxazole -trimethoprim  (BACTRIM  DS) 800-160 mg per tablet Take 1 tablet (160 mg of trimethoprim  total) by mouth every twelve (12) hours.       No current facility-administered medications for this visit.           Physical Exam:   LMP 12/02/2016 (Approximate)  GEN: no apparent distress, appears comfortable on exam  HEENT: OP clear with no erythema, lesions, exudate, mucous membranes moist  NECK: Supple, no lymphadenopathy  LUNGS: CTAB, no wheezes, rales, or rhonchi  CV: S1/S2, RRR, no murmurs  ABD: Soft, nontender, no rebound/guarding, nondistended, normoactive bowel sounds, no appreciable organomegaly  Extremities: no cyanosis, clubbing or edema, normal gait  Psych: affect appropriate, A&O x3  SKIN: no visible lesions on face, neck, arms, abdomen          Labs, Data & Indices:     Lab Review:   Lab Results   Component Value Date    WBC 4.9 05/17/2022    WBC 6.5 04/29/2016    RBC 4.31 05/17/2022    RBC 4.50 04/29/2016    HGB 13.3 05/17/2022    HGB 13.2 04/29/2016     Lab Results   Component Value Date    AST 18 08/22/2023    AST 13 04/29/2016    ALT 12 08/22/2023    ALT 9 04/29/2016    BUN 14 08/22/2023    BUN 11 04/29/2016    Creatinine 0.86 08/22/2023    Creatinine 0.80 04/29/2016    CO2 26.6 08/22/2023    CO2 21 04/29/2016    Albumin 4.0 08/22/2023    Albumin 4.3 08/19/2014    Calcium 9.7 08/22/2023    Calcium 9.4 04/29/2016     Lab Results   Component Value Date    TSH 2.810 04/29/2016      ...........................................................................................................................................SABRA              Assessment & Recommendations:   Disease state:  Ileocolonic Crohn's with perianal disease and anorectal stricture    Camilla Skeen is a 53 y.o. female with a long standing hx of stricturing ileocolonic Crohn's disease.  She also has hx of perianal disease (last requiring drainage by Dr. Fleurette 2018).  She was well maintained in clinical and endoscopic remission on on weekly Humira  monotherapy. However due to insurance issues she has had a month-long gap in treatment. She is now having some symptoms of recurrent colonic Crohn's disease. I think the symptoms are due to delay in Humira  and we will plan to restart TNF (without re-loading due to overall mild nature of symptoms and relatively short duration off therapy).  We will also use a short budesonide  taper given the active symptoms and check labs today. If she has a good response we will continue Humira  monotherapy as planned (and will be due for colonoscopy ~2027).  If she does not have a good response to Humira , we will evaluate further including Humira  trough level and likely colonoscopy.     PRE-VISIT PLANNING NOTE    PLAN:  PRE-VISIT PLANNING NOTE    IBD health maintenance:  Influenza vaccine: 2012, 117/17, 04/2022  Pneumonia vaccine: Prevnar 02/2014; Pneumovax 3//08/2018  COVID Vaccine:  Pfizer April 2021 x 2 doses, 3rd shot 04/2020  Hepatitis B:   TB testing:   Chickenpox/Shingles history: Shingrix #18 November 2021, #2 04/2022  Bone denistometry:   Derm appointment:  Last small bowel imaging:   Last colonoscopy: 01/2019  PAP smear:   --------------------------------------------  Author: Rodger Phlegm, MD 02/14/2024 9:47 AM    Rodger Phlegm, MD  Associate Professor of Medicine  Division of Gastroenterology & Hepatology  [employer]  [facility]  ========================================

## 2024-02-14 NOTE — Unmapped (Signed)
 Patient had a visit with me on  02/13/2024 and cancelled the appointment same day.

## 2024-02-22 NOTE — Unmapped (Signed)
 The [facility] has made a second and final attempt to reach this patient to refill the following medication:Humira .      We have left voicemails on the following phone numbers: [phone], have been unable to leave messages on the following phone numbers: [phone], and have sent a text message to the following phone numbers: [phone].    Dates contacted: 8/29 & 9/4  Last scheduled delivery: 01/23/24 (28 day supply)    The patient may be at risk of non-compliance with this medication. The patient should call the [facility] at [phone]  Option 4, then Option 2: Dermatology, Gastroenterology, Rheumatology to refill medication.    Kelly CHRISTELLA Eagles   [facility] Specialty Technician

## 2024-03-07 ENCOUNTER — Ambulatory Visit: Payer: Self-pay

## 2024-03-07 NOTE — Telephone Encounter (Signed)
 Appt on 9/19 with PCP.

## 2024-03-07 NOTE — Telephone Encounter (Signed)
 Noted- ok to close.

## 2024-03-07 NOTE — Telephone Encounter (Signed)
 FYI Only or Action Required?: FYI only for provider.  Patient was last seen in primary care on 01/25/2024 by Ozell Heron HERO, MD.  Called Nurse Triage reporting Flank Pain and Urinary Frequency.  Symptoms began several days ago.  Interventions attempted: Rest, hydration, or home remedies.  Symptoms are: unchanged.  Triage Disposition: See PCP When Office is Open (Within 3 Days)  Patient/caregiver understands and will follow disposition?: Yes   Copied from CRM [id number]. Topic: Clinical - Red Word Triage >> Mar 07, 2024  9:00 AM Robinson H wrote: [facility] that prompted transfer to Nurse Triage: UTI symptoms flank pain on left side, pressure, frequent urination Reason for Disposition  [1] MILD pain (i.e., scale 1-3; does not interfere with normal activities) AND [2] present > 3 days  Answer Assessment - Initial Assessment Questions 1. LOCATION: Where does it hurt? (e.g., left, right)     left 2. ONSET: When did the pain start?     Saturday 3. SEVERITY: How bad is the pain? (e.g., Scale 1-10; mild, moderate, or severe)     pressure 4. PATTERN: Does the pain come and go, or is it constant?      constant 5. CAUSE: What do you think is causing the pain?     uti 6. OTHER SYMPTOMS:  Do you have any other symptoms? (e.g., fever, abdomen pain, vomiting, leg weakness, burning with urination, blood in urine)     Frequency, bladder pressure, urgency 7. PREGNANCY:  Is there any chance you are pregnant? When was your last menstrual period?  Protocols used: Flank Pain-A-AH

## 2024-03-08 ENCOUNTER — Ambulatory Visit (INDEPENDENT_AMBULATORY_CARE_PROVIDER_SITE_OTHER): Payer: PRIVATE HEALTH INSURANCE | Admitting: Family Medicine

## 2024-03-08 ENCOUNTER — Encounter: Payer: Self-pay | Admitting: Family Medicine

## 2024-03-08 VITALS — BP 108/58 | HR 77 | Temp 97.8°F | Ht 65.0 in | Wt 230.9 lb

## 2024-03-08 DIAGNOSIS — N951 Menopausal and female climacteric states: Secondary | ICD-10-CM | POA: Diagnosis not present

## 2024-03-08 DIAGNOSIS — R300 Dysuria: Secondary | ICD-10-CM | POA: Diagnosis not present

## 2024-03-08 DIAGNOSIS — F902 Attention-deficit hyperactivity disorder, combined type: Secondary | ICD-10-CM

## 2024-03-08 DIAGNOSIS — N3001 Acute cystitis with hematuria: Secondary | ICD-10-CM

## 2024-03-08 LAB — POC URINALSYSI DIPSTICK (AUTOMATED)
Bilirubin, UA: NEGATIVE
Glucose, UA: NEGATIVE
Ketones, UA: NEGATIVE
Nitrite, UA: NEGATIVE
Protein, UA: POSITIVE — AB
Spec Grav, UA: 1.02 (ref 1.010–1.025)
Urobilinogen, UA: 0.2 U/dL
pH, UA: 6.5 (ref 5.0–8.0)

## 2024-03-08 MED ORDER — AMPHETAMINE-DEXTROAMPHET ER 20 MG PO CP24: 20.0000 mg | ORAL_CAPSULE | Freq: Every day | ORAL | 0 refills | Status: DC

## 2024-03-08 MED ORDER — NITROFURANTOIN MONOHYD MACRO 100 MG PO CAPS: 100.0000 mg | ORAL_CAPSULE | Freq: Two times a day (BID) | ORAL | 0 refills | Status: DC

## 2024-03-08 MED ORDER — CLIMARA PRO 0.045-0.015 MG/DAY TD PTWK: 1.0000 | MEDICATED_PATCH | TRANSDERMAL | 12 refills | Status: AC

## 2024-03-08 NOTE — Progress Notes (Signed)
 Acute Office Visit  Subjective:     Patient ID: Katrina Grant, female    DOB: [birth year], 53 y.o.   MRN: [id number]  Chief Complaint  Patient presents with   Dysuria    X2 days    Dysuria  This is a new problem. The current episode started yesterday. The quality of the pain is described as burning. The pain is at a severity of 4/10. The pain is moderate. There is No history of pyelonephritis. Associated symptoms include frequency and urgency. She has tried nothing for the symptoms.   Patient is in today for pressure in her bladder x 3 days, then started getting left sided flank pain yesterday. No fever or chills, no nausea or vomiting. States that she took some leftover flagyl  from a previous BV infection, states that it seems like she gets infections after having intercourse with her husband sometimes.   Pt reports continued hot flashes and vaginal dryness, I reviewed labs, this is her 2nd UTI in 7 months. We had a long discussion about HRT and I counseled her on the risks/benefits of HRT to help improve her risk of UTI and improve hot flashes.   Review of Systems  Genitourinary:  Positive for dysuria, frequency and urgency.        Objective:    BP (!) 108/58   Pulse 77   Temp 97.8 F (36.6 C) (Oral)   Ht 5' 5 (1.651 m)   Wt 230 lb 14.4 oz (104.7 kg)   LMP 12/24/2020 (Approximate)   SpO2 98%   BMI 38.42 kg/m    Physical Exam Vitals reviewed.  Constitutional:      Appearance: Normal appearance. She is obese.  Cardiovascular:     Rate and Rhythm: Normal rate and regular rhythm.     Heart sounds: Normal heart sounds.  Pulmonary:     Effort: Pulmonary effort is normal.  Abdominal:     Tenderness: There is no right CVA tenderness or left CVA tenderness.  Neurological:     Mental Status: She is alert and oriented to person, place, and time. Mental status is at baseline.     Results for orders placed or performed in visit on 03/08/24  POCT Urinalysis  Dipstick (Automated)  Result Value Ref Range   Color, UA yellow    Clarity, UA cloudy    Glucose, UA Negative Negative   Bilirubin, UA negative    Ketones, UA negative    Spec Grav, UA 1.020 1.010 - 1.025   Blood, UA 3+    pH, UA 6.5 5.0 - 8.0   Protein, UA Positive (A) Negative   Urobilinogen, UA 0.2 0.2 or 1.0 E.U./dL   Nitrite, UA negative    Leukocytes, UA Large (3+) (A) Negative        Assessment & Plan:   Problem List Items Addressed This Visit       Unprioritized   ADD (attention deficit disorder) (Chronic)   Relevant Medications   amphetamine -dextroamphetamine  (ADDERALL XR) 20 MG 24 hr capsule   Other Visit Diagnoses       Dysuria    -  Primary   Relevant Orders   POCT Urinalysis Dipstick (Automated) (Completed)     Acute cystitis with hematuria       Relevant Medications   nitrofurantoin , macrocrystal-monohydrate, (MACROBID ) 100 MG capsule     Menopausal hot flushes       Relevant Medications   estradiol -levonorgestrel (CLIMARA  PRO) 0.045-0.015 MG/DAY  UA positive with blood and LE, will treat with macrobid  for 7 days.   Hot flashes/ vaginal dryness -- will start climara  pro patches for her menopausal symptoms, risks/benefits of HRT reviewed with patient. RTC in 2 months for ADHD follow up.   Meds ordered this encounter  Medications   nitrofurantoin , macrocrystal-monohydrate, (MACROBID ) 100 MG capsule    Sig: Take 1 capsule (100 mg total) by mouth 2 (two) times daily.    Dispense:  14 capsule    Refill:  0   estradiol -levonorgestrel (CLIMARA  PRO) 0.045-0.015 MG/DAY    Sig: Place 1 patch onto the skin once a week.    Dispense:  4 patch    Refill:  12   amphetamine -dextroamphetamine  (ADDERALL XR) 20 MG 24 hr capsule    Sig: Take 1 capsule (20 mg total) by mouth daily.    Dispense:  30 capsule    Refill:  0    No follow-ups on file.  Heron CHRISTELLA Sharper, MD

## 2024-04-22 NOTE — Progress Notes (Signed)
 [facility]   FOLLOWUP NOTE - INFLAMMATORY BOWEL DISEASE  04/23/2024    Demographics:  Celia Friedland is a 53 y.o. year old female    Diagnosis:  Crohn's Disease  Disease onset (yr): 95  Age at onset:   < 63 yr old (A1)  Location:  Ileocolonic (L3) (mostly colonic)  Behavior:  Perianal (P), Stricturing (B2)  Current Tight Control Scenario:   Maintenance = Biologic          HPI / NOTE :     Interval Events:   1.  Last seen by March 2025 - at that time, she had a 1 month gap in Humira  and had some breakthrough symptoms.  We decided to resume Humira  and use a short Entocort taper.  She was off desipramine  at that time, but was taking combination of Wellbutrin and Lexapro prescribed by her PCP.  She had multiple life stressors ongoing at that time.   2.  Last labs march 2025 -CBC was not done.  CMP unremarkable.  ESR mildly elevated at 31, CRP normal, iron levels and B12 normal.   3.  May 2025, lost insurance and hence changed to Humira  through manufactures assistance program.  We also started cholestyramine  1 packet daily for likely bile acid diarrhea.    HPI:  She has not been on Humira  in almost 3 months. She was on her husband's insurance when he started a new job at the end of this summer and was still on Humira . Her husband decided to go back to his old job 01/2024 that he was previously laid off from, but came back as a temp with poor insurance. She was told she needed to pay $14000 for a month of humira . He will be at this job through 05/2024.     She just started a new job herself that is permanent for which she now has [employer]. 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     She is currently experiencing diffuse joint pain and muscle pain. She has had two flares since stopping humira  which she describes as having a break out of mouth ulcers, abdominal pain, diarrhea, but no hematochezia. No eye pain, oral ulcers, rashes or wounds. She is not experiencing any dysphagia or odynophagia.     Abdominal pain (0-10): none  BM a day: ~2-4x/day, based on food consumed.   Consistency: formed  % of stools have blood: 0%  Nocturnal BM: 1-2  Urgency:  mild - but increased during flare  Weight change over last 6 mo: mild weight loss.   Smoking:  no  NSAIDS: avoids    Review of Systems:   Review of systems positive for: negative except as above.   Otherwise, the balance of 10 systems is negative.          IBD HISTORY:     Brief IBD Disease Course:    - 59 - dx with Crohn's dz ~age 36, sx of diarrhea, abd pain, rectal pain and bloody stools.  Treated with sulfasalazine, Asacol, Pentasa without benefit.  Got courses of steroids and antibiotics on and off.  Tried on but had nausea.   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    - 2004 - Remicade x 9 months. Stopped in 2005 due to 2ndary loss of response.   - 2005 - developed a pyoderma lesion.   - 2010 - Re-established care with Dr. Lehman.  Concern for  internal fistulizing disease.  Was on Cimzia. Had surgery for perianal disease.   - 2011 - Started on Humira .   - 2012 - active perianal disease with a large abscess.  Required EUA, drainage, setons ([place]).   - 2013 - April, dx with pelvic floor dyssynergia and anal sphincter weakness, underwent biofeedback x 2 courses ([place] and [place]).   - 2014 - on - doing well on Humira  and long term Cipro.  Having on and off bowel symptoms symptoms which have been thought to be related to intermittent IBS symptoms.   - 2023 - Remains on Humira  weekly, uptitrating desipramine  and levsin  PRN for IBS symptoms.   - 2025 - had a ~1 month gap in Humira  (feb), mild breakthrough symptoms, resuming Humira  + short entocort taper    Endoscopy:      - Colonoscopy 06/17/2003: patch inflammation and ulcers throughout the colon.    - Colonoscopy 01/2009:  Active crohn's at IC valve. Pseudopolyps.  Retraction of IC valve. ?Rectosigmoid fistula tract.  Otherwise normal colon.   - Colonoscopy 10/08/2009:  Perianal fistula, pseudopolyps.  Few aphthae in TI.   - Colonoscopy 07/29/2010:  Poor colon prep, pseudopolyps, stricture at IC valve.  Few ulcers at IC valve. Rectal stricture. Ileal crohn's disease.   - Colonoscopy 05/05/2011:  Pseduopolyps throughout.  4mm cecal polyp. Large fistula in sigmoid colon. Normal TI.   - Colonoscopy 09/06/2012:  Healed scars from prior fistulotomies on perianal exam. Scar in ascending colon. No active Crohn's.   - Colonoscopy 10/24/13:  Pseudopolyps in rectosigmoid, otherwise normal colon and TI.   - Colonoscopy 10/01/14:  Pseudopolyps in rectum, transverse and ascending colon.  Otherwise normal colon.  Granular TI.  Fixed and open IC valve (not stenotic).   - Colonoscopy 08/12/16:  Mild anal canal stenosis on exam, otherwise normal colon and ileum.     PATH = chronic quiescent colitis (left and right colon) no dysplasia  - Colonoscopy 01/28/2019 - mild anal stricture (dilated to 20mm via [place]), otherwise normal colon and ileum with no active inflammation  - Colonoscopy 06/27/2022 - rectal stricture on exam, dilation to 20mm. One 4mm polyp in the sigmoid colon. Internal hemorrhoids. Otherwise normal colon with no active Crohn's disease.    PATH:  Normal colonic mucosa (left and right colon).      Imaging:    - CT A/P 10/24/2013: mild colonic wall thickening at splenic/descending colon, no other bowel inflammation or stranding.  *Patchy groundglass opacity in left lung base.     Prior IBD medications (type, dose, duration, response):  x 5-ASAs - Sulfasalazine, Asacol, Pentasa  x Oral corticosteroids - Prednisone  []  Intravenous corticosteroids  x Antibiotics - Flagyl - nausea.  Cipro - tolerates well and it helps.   x Thiopurines - - nausea.    TPMT - normal 07/25/17  x Methotrexate - 2005.   x Anti-TNF therapies - Remicade 2004 - 2005 (~6 doses).  Had gradual loss of response so stopped. Tried on Remicade again in 2009 with some effect, increased to 10mg /kg q4 weeks. Cimzia - started 09/2008.  Humira  2014 - dose increased to 80mg  q2 weeks.  04/2016 - humira  level 5.6, Ab negative.   []  Cyclosporine  []  Clinical trial medication  []  Other (Please specify):    Extraintestinal manifestations:   -joint pains affecting: n  -eye: n  -skin: n  -oral ulcers :  n  -blood clots: n  -PSC: n  -other: n  Past Medical History:   Past medical history:   Past Medical History:   Diagnosis Date    Acid reflux     Arthritis     Crohn disease    (CMS-HCC)     Fibromyalgia     UTI (urinary tract infection)      Past surgical history:   Past Surgical History:   Procedure Laterality Date    FRACTURE SURGERY      left wrist    GASTROCUTANEOUS FISTULA CLOSURE      ORTHOPEDIC SURGERY      PR COLONOSCOPY FLX DX W/COLLJ SPEC WHEN PFRMD N/A 10/24/2013    Procedure: COLONOSCOPY, FLEXIBLE, PROXIMAL TO SPLENIC FLEXURE; DIAGNOSTIC, W/WO COLLECTION SPECIMEN BY BRUSH OR WASH;  Surgeon: Dorn JINNY Lauth, MD;  [facility];  Service: Gastroenterology    PR COLONOSCOPY W/BIOPSY SINGLE/MULTIPLE N/A 10/01/2014    Procedure: COLONOSCOPY, FLEXIBLE, PROXIMAL TO SPLENIC FLEXURE; WITH BIOPSY, SINGLE OR MULTIPLE;  Surgeon: Luke LITTIE Notch, MD;  Location: [facility];  Service: Gastroenterology    PR COLONOSCOPY W/BIOPSY SINGLE/MULTIPLE N/A 08/12/2016    Procedure: COLONOSCOPY, FLEXIBLE, PROXIMAL TO SPLENIC FLEXURE; WITH BIOPSY, SINGLE OR MULTIPLE;  Surgeon: Rodger Phlegm, MD;  Location: [facility];  Service: Gastroenterology    PR COLONOSCOPY W/BIOPSY SINGLE/MULTIPLE Left 01/28/2019    Procedure: COLONOSCOPY, FLEXIBLE, PROXIMAL TO SPLENIC FLEXURE; WITH BIOPSY, SINGLE OR MULTIPLE;  Surgeon: Rodger Phlegm, MD;  Location: [facility];  Service: Gastroenterology    PR COLONOSCOPY W/BIOPSY SINGLE/MULTIPLE Left 06/27/2022    Procedure: COLONOSCOPY, FLEXIBLE, PROXIMAL TO SPLENIC FLEXURE; WITH BIOPSY, SINGLE OR MULTIPLE;  Surgeon: Phlegm Rodger, MD;  Location: [facility];  Service: Gastroenterology    PR COLSC FLX W/RMVL OF TUMOR POLYP LESION SNARE TQ Left 01/28/2019    Procedure: COLONOSCOPY FLEX; W/REMOV TUMOR/LES BY SNARE;  Surgeon: Rodger Phlegm, MD;  Location: [facility];  Service: Gastroenterology    PR COLSC FLX W/RMVL OF TUMOR POLYP LESION SNARE TQ N/A 06/27/2022    Procedure: COLONOSCOPY FLEX; W/REMOV TUMOR/LES BY SNARE;  Surgeon: Phlegm Rodger, MD;  [facility]: [facility];  Service: Gastroenterology    PR DILATION RECTAL STRICTURE W ANEST Left 06/27/2022    Procedure: DILATION OF RECTAL STRICTURE (SEPARATE PROCEDURE) UNDER ANESTHESIA OTHER THAN LOCAL;  Surgeon: Phlegm Rodger, MD;  Location: [facility];  Service: Gastroenterology    PR SURG DIAGNOSTIC EXAM, ANORECTAL N/A 12/23/2016    Procedure: ANORECTAL EXAM, SURGICAL, REQUIRING ANESTHESIA (GENERAL, SPINAL, OR EPIDURAL), DIAGNOSTIC;  Surgeon: Evalene Karalee Shilling, MD;  Location: MAIN OR [place];  Service: Gastrointestinal    PR UPPER GI ENDOSCOPY,DIAGNOSIS N/A 08/12/2016    Procedure: UGI ENDO, INCLUDE ESOPHAGUS, STOMACH, & DUODENUM &/OR JEJUNUM; DX W/WO COLLECTION SPECIMN, BY BRUSH OR WASH;  Surgeon: Rodger Phlegm, MD;  Location: [facility];  Service: Gastroenterology    TUBAL LIGATION       Family history:   Family History   Problem Relation Age of Onset    Cancer Sister     Cancer Paternal Grandmother     Colorectal Cancer Neg Hx      Social history:   Social History     Socioeconomic History    Marital status: Married     Spouse name: None    Number of children: None    Years of education: None    Highest education level: None   Tobacco Use    Smoking status: Never  Smokeless tobacco: Never   Vaping Use    Vaping status: Never Used   Substance and Sexual Activity    Alcohol use: Yes     Alcohol/week: 0.0 standard drinks of alcohol     Comment: socially     Drug use: No     Social Drivers of Psychologist, Prison And Probation Services Strain: Low Risk  (11/22/2021)    Received from [facility]    Overall Financial Resource Strain (CARDIA)     Difficulty of Paying Living Expenses: Not very hard   Food Insecurity: No Food Insecurity (11/22/2021)    Received from [facility]    Hunger Vital Sign     Within the past 12 months, you worried that your food would run out before you got the money to buy more.: Never true     Within the past 12 months, the food you bought just didn't last and you didn't have money to get more.: Never true   Transportation Needs: No Transportation Needs (11/22/2021)    Received from [facility]     Lack of Transportation (Medical): No     Lack of Transportation (Non-Medical): No   Physical Activity: Unknown (11/22/2021)    Received from [facility]    Exercise Vital Sign     On average, how many days per week do you engage in moderate to strenuous exercise (like a brisk walk)?: 0 days   Stress: No Stress Concern Present (11/22/2021)    Received from [facility] - Occupational Stress Questionnaire     Feeling of Stress : Not at all   Social Connections: Unknown (11/22/2021)    Received from [facility]    Social Connection and Isolation Panel     In a typical week, how many times do you talk on the phone with family, friends, or neighbors?: Three times a week     How often do you get together with friends or relatives?: Once a week     How often do you attend church or religious services?: Patient declined     Do you belong to any clubs or organizations such as church groups, unions, fraternal or athletic groups, or school groups?: Patient declined     Are you married, widowed, divorced, separated, never married, or living with a partner?: Married             Allergies:     Allergies   Allergen Reactions    Oxycodone  (Bulk) Nausea And Vomiting     Patient tolerates acetaminophen well             Medications:     Current Outpatient Medications   Medication Sig Dispense Refill    acetaminophen (TYLENOL) 500 MG tablet Take 2 tablets (1,000 mg total) by mouth every four (4) hours as needed for pain.      buPROPion (WELLBUTRIN XL) 150 MG 24 hr tablet Take 1 tablet (150 mg total) by mouth daily.      cholestyramine  (CHOLESTYRAMINE  LIGHT) 4 gram PwPk Take 1 packet by mouth daily. 30 packet 11    desipramine  (NORPRAMIN ) 25 MG tablet TAKE 6 TABLETS (150 MG TOTAL) BY MOUTH DAILY. 540 tablet 2    dextroamphetamine-amphetamine (ADDERALL XR) 20 MG 24 hr capsule Take 1 capsule (20 mg total) by mouth daily.      dextroamphetamine-amphetamine (ADDERALL) 10 mg tablet TAKE 1 TABLET DAILY AFTER LUNCH.  escitalopram oxalate (LEXAPRO) 5 MG tablet Take 1 tablet (5 mg total) by mouth daily. TAKE 1 TABLET (5 MG TOTAL) BY MOUTH DAILY.      fluticasone (FLONASE) 50 mcg/actuation nasal spray 1 SPRAY BY EACH NARE ROUTE DAILY. AS DIRECTED (Patient taking differently: 1 spray by Each Nare route daily as needed. As directed) 16 mL 2    HUMIRA  PEN CITRATE FREE 40 MG/0.4 ML Inject 1 pen (40 mg total) under the skin every seven (7) days. 12 each 3    MULTIVIT WITH CALCIUM,IRON,MIN (WOMEN'S DAILY MULTIVITAMIN ORAL) Take 1 tablet by mouth once daily.      ondansetron (ZOFRAN-ODT) 8 MG disintegrating tablet Take 1 tablet (8 mg total) by mouth every eight (8) hours as needed.      polyethylene glycol (CLEARLAX) 17 gram/dose powder Take as directed for split prep. 238 g 0    polyethylene glycol (CLEARLAX) 17 gram/dose powder Take as directed for split prep. 119 g 0    predniSONE (DELTASONE) 10 mg tablet pack Take by mouth daily. 6 day dose pack for hurt arm.      simethicone  (MYLICON) 125 MG chewable tablet Take 2 tablets (250mg  total) with prep as directed. 4 tablet 0    sulfamethoxazole -trimethoprim  (BACTRIM  DS) 800-160 mg per tablet Take 1 tablet (160 mg of trimethoprim  total) by mouth every twelve (12) hours.       No current facility-administered medications for this visit.           Physical Exam:   BP 127/85 (BP Site: L Arm, BP Position: Sitting, BP Cuff Size: Medium)  - Pulse 67  - Temp 36.4 ??C (97.5 ??F) (Temporal)  - Ht 165.1 cm (5' 5)  - Wt (!) 102.5 kg (226 lb)  - LMP 12/02/2016 (Approximate)  - BMI 37.61 kg/m??     GEN: no apparent distress, appears comfortable on exam  HEENT: OP clear with no erythema, lesions, exudate, mucous membranes moist  NECK: Supple, no lymphadenopathy  LUNGS: CTAB, no wheezes, rales, or rhonchi  CV: S1/S2, RRR, no murmurs  ABD: Soft, nontender, no rebound/guarding, nondistended, normoactive bowel sounds, no appreciable organomegaly  Extremities: no cyanosis, clubbing or edema, normal gait  Psych: affect appropriate, A&O x3  SKIN: no visible lesions on face, neck, arms, abdomen          Labs, Data & Indices:     Lab Review:   Lab Results   Component Value Date    WBC 4.9 05/17/2022    WBC 6.5 04/29/2016    RBC 4.31 05/17/2022    RBC 4.50 04/29/2016    HGB 13.3 05/17/2022    HGB 13.2 04/29/2016     Lab Results   Component Value Date    AST 18 08/22/2023    AST 13 04/29/2016    ALT 12 08/22/2023    ALT 9 04/29/2016    BUN 14 08/22/2023    BUN 11 04/29/2016    Creatinine 0.86 08/22/2023    Creatinine 0.80 04/29/2016    CO2 26.6 08/22/2023    CO2 21 04/29/2016    Albumin 4.0 08/22/2023    Albumin 4.3 08/19/2014    Calcium 9.7 08/22/2023    Calcium 9.4 04/29/2016     Lab Results   Component Value Date    TSH 2.810 04/29/2016      ...........................................................................................................................................SABRA          Assessment & Recommendations:   Disease state:  Ileocolonic Crohn's with perianal disease and anorectal stricture  Kareem Aul is a 53 y.o. female with a long standing hx of stricturing ileocolonic Crohn's disease.  She also has hx of perianal disease (last requiring drainage by Dr. Fleurette 2018).  She was well maintained in clinical and endoscopic remission on on weekly Humira  monotherapy. However due to insurance issues she had a month-long gap in treatment (feb 2025) resulting in symptoms of recurrent colonic Crohn's disease. We resumed Humira  with a short course of Entocort. Unfortunately she had a recurrent 3 month-long gap in treatment starting 01/2024 due to her husband's job loss and insurance issues. 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     We will plan to restart adalimumab  (likely a biosmiliar depending on her insurance) with a repeat induction since she has been off for three months. Because she has now had several lapses in adalimumab  therapy she is at risk for the development of neutralizing antibodies. We discussed concomitant treatment with a thiopurine to prevent immunogenicity but she would like to continue with monotherapy at this time. She was previously on with associated nausea. We will plan to check adalimumab  TDM in 2 months after she completes her induction doses and starts her maintenance injections to ensure she is not developing antibodies.     PLAN:  Re-start adalimumab  induction and maintenance dosing  Plan to check adalimumab  TDM at [facility] in 2 months. Discussed that this should be completed at most 1-2 days before her injection.  Check maintenance labs and HBVsAg today    Pneumococcal and influenza vaccines given today    IBD health maintenance:  Influenza vaccine: 04/23/2024  Pneumonia vaccine: PCV21 04/23/24  COVID Vaccine:  Pfizer April 2021 x 2 doses, 3rd shot 04/2020  Hepatitis B: Due today   TB testing: Negative 08/2023  Chickenpox/Shingles history: Shingrix #18 November 2021, #2 04/2022  Bone denistometry: DEXA normal 02/2021  Derm appointment: Counseled today  Last small bowel imaging: NA  Last colonoscopy: 06/27/2022  PAP smear: Counseled today  --------------------------------------------  Author: Waddell DELENA Seltzer, MD 04/23/2024 2:29 PM  ==============================================  I saw and evaluated Hadassah Louetta Skates with advanced IBD Fellow Dr. Seltzer.  I participated in key portions of the service and personally reviewed the plan of care with the patient.  I reviewed the fellow's note and agree with the fellow???s findings and plan.     Additional thoughts are below: Ms. Marylen is had a prolonged Off of Humira  due to insurance and logistical challenges.  We will plan to restart Humira  at this time with full loading dose.  We will check Humira  trough level after 1 maintenance dose to ensure that there are no antibodies and a good drug level.    Rodger Phlegm, MD  Associate Professor of Medicine  Division of Gastroenterology & Hepatology  [employer]  [facility]  ========================================

## 2024-04-23 ENCOUNTER — Ambulatory Visit
Admit: 2024-04-23 | Discharge: 2024-04-24 | Payer: BLUE CROSS/BLUE SHIELD | Attending: Student in an Organized Health Care Education/Training Program | Primary: Student in an Organized Health Care Education/Training Program

## 2024-04-23 DIAGNOSIS — Z79899 Other long term (current) drug therapy: Secondary | ICD-10-CM | POA: Diagnosis not present

## 2024-04-23 DIAGNOSIS — Z792 Long term (current) use of antibiotics: Secondary | ICD-10-CM | POA: Diagnosis not present

## 2024-04-23 DIAGNOSIS — Z885 Allergy status to narcotic agent status: Secondary | ICD-10-CM | POA: Diagnosis not present

## 2024-04-23 DIAGNOSIS — D84821 Immunosuppression due to drug therapy (HHS-HCC): Principal | ICD-10-CM

## 2024-04-23 DIAGNOSIS — M2559 Pain in other specified joint: Secondary | ICD-10-CM | POA: Diagnosis not present

## 2024-04-23 DIAGNOSIS — M797 Fibromyalgia: Secondary | ICD-10-CM | POA: Diagnosis not present

## 2024-04-23 DIAGNOSIS — K5000 Crohn's disease of small intestine without complications: Secondary | ICD-10-CM | POA: Diagnosis not present

## 2024-04-23 DIAGNOSIS — K580 Irritable bowel syndrome with diarrhea: Principal | ICD-10-CM

## 2024-04-23 DIAGNOSIS — Z5971 Insurance coverage problems: Principal | ICD-10-CM

## 2024-04-23 LAB — COMPREHENSIVE METABOLIC PANEL
ALBUMIN: 3.9 g/dL (ref 3.4–5.0)
ALKALINE PHOSPHATASE: 72 U/L (ref 46–116)
ALT (SGPT): 9 U/L — ABNORMAL LOW (ref 10–49)
ANION GAP: 12 mmol/L (ref 5–14)
AST (SGOT): 18 U/L (ref <=34)
BILIRUBIN TOTAL: 0.6 mg/dL (ref 0.3–1.2)
BLOOD UREA NITROGEN: 9 mg/dL (ref 9–23)
BUN / CREAT RATIO: 12
CALCIUM: 9.3 mg/dL (ref 8.7–10.4)
CHLORIDE: 100 mmol/L (ref 98–107)
CO2: 26.7 mmol/L (ref 20.0–31.0)
CREATININE: 0.76 mg/dL (ref 0.55–1.02)
EGFR CKD-EPI (2021) FEMALE: >90 mL/min/1.73m2 (ref >=60)
GLUCOSE RANDOM: 90 mg/dL (ref 70–99)
POTASSIUM: 3.9 mmol/L (ref 3.4–4.8)
PROTEIN TOTAL: 7.6 g/dL (ref 5.7–8.2)
SODIUM: 139 mmol/L (ref 135–145)

## 2024-04-23 LAB — CBC W/ AUTO DIFF
BASOPHILS ABSOLUTE COUNT: 0 10*9/L (ref 0.0–0.1)
BASOPHILS RELATIVE PERCENT: 0.6 %
EOSINOPHILS ABSOLUTE COUNT: 0.1 10*9/L (ref 0.0–0.5)
EOSINOPHILS RELATIVE PERCENT: 1.7 %
HEMATOCRIT: 43 % (ref 34.0–44.0)
HEMOGLOBIN: 14.1 g/dL (ref 11.3–14.9)
LYMPHOCYTES ABSOLUTE COUNT: 2.8 10*9/L (ref 1.1–3.6)
LYMPHOCYTES RELATIVE PERCENT: 38.8 %
MEAN CORPUSCULAR HEMOGLOBIN CONC: 32.8 g/dL (ref 32.0–36.0)
MEAN CORPUSCULAR HEMOGLOBIN: 29.7 pg (ref 25.9–32.4)
MEAN CORPUSCULAR VOLUME: 90.6 fL (ref 77.6–95.7)
MEAN PLATELET VOLUME: 8.2 fL (ref 6.8–10.7)
MONOCYTES ABSOLUTE COUNT: 0.4 10*9/L (ref 0.3–0.8)
MONOCYTES RELATIVE PERCENT: 5.9 %
NEUTROPHILS ABSOLUTE COUNT: 3.8 10*9/L (ref 1.8–7.8)
NEUTROPHILS RELATIVE PERCENT: 53 %
PLATELET COUNT: 233 10*9/L (ref 150–450)
RED BLOOD CELL COUNT: 4.74 10*12/L (ref 3.95–5.13)
RED CELL DISTRIBUTION WIDTH: 13 % (ref 12.2–15.2)
WBC ADJUSTED: 7.1 10*9/L (ref 3.6–11.2)

## 2024-04-23 LAB — C-REACTIVE PROTEIN: C-REACTIVE PROTEIN: <5 mg/L (ref <=10.0)

## 2024-04-23 LAB — MAGNESIUM: MAGNESIUM: 2 mg/dL (ref 1.6–2.6)

## 2024-04-23 MED ORDER — ADALIMUMAB-RYVK 40 MG/0.4 ML SUBCUTANEOUS AUTO-INJECTOR KIT
SUBCUTANEOUS | 1 refills | 84.00000 days | Status: CP
Start: 2024-04-23 — End: ?

## 2024-04-23 MED ORDER — BUDESONIDE DR - ER 3 MG CAPSULE,DELAYED,EXTENDED RELEASE
ORAL_CAPSULE | ORAL | 0 refills | 58.00000 days | Status: CP
Start: 2024-04-23 — End: 2024-06-20

## 2024-04-23 MED ORDER — ADALIMUMAB-RYVK 80 MG/0.8 ML SUBCUTANEOUS AUTO-INJECTOR KIT
SUBCUTANEOUS | 0 refills | 0.00000 days | Status: CP
Start: 2024-04-23 — End: ?
  Filled 2024-05-08: qty 3, 28d supply, fill #0

## 2024-04-23 NOTE — Patient Instructions (Addendum)
 Jasmine Mitchell it was a pleasure seeing you today.  Here is a summary from today's visit:     Start your adalimumab  medication with the higher induction starting dose and then go back to the lower maintenance dose. The first dose will be 160mg , then the second dose 2 weeks later will be 80mg  and then the maintenance doses will start two weeks after that at 40mg  every 2 weeks.   You may develop antibodies to the adalimumab  because you have been off of it. It is very important that we check a level after you complete the beginning doses of your adalimumab . We have placed an order for you to check this lab at [facility] in approximately two months after starting your adalimumab  injections. Please try to check this 1-2 days before you give yourself your next injection.   Check labs today.   Pneumonia and influenza vaccines given today  Our pharmacist will check in with you on video in 1-2 months   Return to our clinic in 6 months.   If you have any trouble or symptoms please do not hesitate to call.     Rodger Phlegm, MD  Associate Professor of Medicine  Division of Gastroenterology & Hepatology  [employer]  [facility]            EXPECTATIONS FOR PATIENT FOLLOW UP AND COMMUNICATION:  -- Follow up Appointments:  Crohn's disease and Ulcerative colitis are serious chronic inflammatory diseases which require close monitoring.  I expect to see patients for a follow up visits at least every 6 months (or more often if you are having a flare, starting new therapy, etc).  In select cases, patients who are only on aminosalicylates / mesalamine, may be seen once per year for follow up.  If you are not able to come for follow up appointments we may not be able to refill your medications or continue caring for you.   -- Appointment Type: While we are continuing to offer video appointments in select cases (stable patients who are not in a flare and without new/active symptoms) during the COVID19 pandemic, I expect to see patients in person at least once per year.   -- Communication:  if you have any questions or concerns, you can communicate with us  via phone (contact information below) or via myChart.  Note that phone messages are given higher priority and triaged first. For urgent issues, please contact us  via phone.      IBD NURSE COORDINATOR CONTACT - Duwaine Legions, RN     Phone: [phone] (direct line)      Fax: [phone]  * For urgent medical concerns after hours or on weekends and holidays, call [phone] and ask to speak to the GI Fellow on call.    * If you have a GI medical question or GI symptoms and would like to speak to your provider's healthcare team, please contact Duwaine Legions, RN (contact information above) OR you can send the GI healthcare team a message through MyChart at [email or web address]    [facility] MESSAGES  -- MyChart messages and advice requests are now going to be sent to our clinical care team. A team member will respond to the message or send it to your provider if needed.   -- The message will typically be addressed within 3-4 business days if it is an issue that can be handled by MyChart.   -- For multiple questions, complex concerns, and/or if you have not been by  seen by your provider in the recommended follow-up period you may be asked to schedule a clinic visit.   -- MyChart messages are NOT read after hours or on weekends. MyChart is NOT meant for urgent issues or emergencies. For emergencies call 911 or go to the nearest emergency department.     APPOINTMENT SCHEDULING FOR GI CLINIC AND GI PROCEDURES:  RADIOLOGY - to schedule imaging ordered, please call [phone]  GI PROCEDURES         [phone] option 2   GI MEDICINE CLINIC  [phone] option 1   *To schedule, reschedule, or cancel your GI appointment, please call [phone]. If you are unable to come to an appointment, please notify us  as soon as possible, preferably 24 hours in advance. Doing so may allow other patients with urgent needs to be scheduled in a cancelled appointment slot.     TEST RESULTS   If you have a MyChart account, your new results and a provider message will be sent to you through your MyChart account at [email or web address]. For results that require follow-up, a member of your healthcare team will also contact you.    PRESCRIPTION REFILL REQUESTS  To request prescription refills, please contact your pharmacy or send your healthcare team a message through your MyChart account at [email or web address]  RECORD REQUESTS  For questions related to medical records, please call Medical Records Release of Information at [phone]  FINANCIAL NAVIGATOR   For billing and other financial questions/needs - please call [phone]. If you need to leave a message, please be sure to leave your full name, date of birth or Medical record number, best call back # and reason for call.    For educational material and resources:  [email or web address]    ================================================================

## 2024-04-23 NOTE — Telephone Encounter (Addendum)
 MEDICATION ORDER ENTRY DOCUMENTATION    The following medication(s) have been sent to pharmacy for insurance authorization:    Medication: adalimumab   Dose & Frequency: 160mg  subcutaneous day 1, then 80mg  subcutaneous day 15, then 40mg  subcutaneous weekly.  Pharmacy: [facility]     TB Check: Yes - Negative 08/22/23  Hepatitis B Check: Yes - Negative 07/18/19    Requesting Physician: Dr. Rodger Phlegm    Notes: pt prefers Simlandi  over Humira  if preferred by insurance. Level before 2nd maintenance. Pt will receive first shipment 11/20. Level needed before 12/4 dose.    Ambulatory Referral to Clinical Pharmacist Placed: once level back, video 1pm    12/12- loading doses filled by [facility]. Maintenance doses have to be filled through [facility], script rerouted. Pt due for her first maintenance dose today, which will be delayed now that she will have to fill with [facility]. Discussed with patient that we will attempt to expedite her prescription and she should take it as soon as she receives it. We will adjust when she gets a level to be before her 3rd maintenance dose on 06/21/24, to get a true level. Pt verbalized agreement with plan.

## 2024-04-24 DIAGNOSIS — K50919 Crohn's disease, unspecified, with unspecified complications: Principal | ICD-10-CM

## 2024-04-25 LAB — HEPATITIS B SURFACE ANTIGEN: HEPATITIS B SURFACE ANTIGEN: NONREACTIVE

## 2024-04-26 ENCOUNTER — Telehealth: Admitting: Family Medicine

## 2024-05-03 ENCOUNTER — Telehealth: Payer: PRIVATE HEALTH INSURANCE | Admitting: Family Medicine

## 2024-05-03 DIAGNOSIS — F419 Anxiety disorder, unspecified: Secondary | ICD-10-CM

## 2024-05-03 DIAGNOSIS — F902 Attention-deficit hyperactivity disorder, combined type: Secondary | ICD-10-CM | POA: Diagnosis not present

## 2024-05-03 MED ORDER — AMPHETAMINE-DEXTROAMPHET ER 20 MG PO CP24: 20.0000 mg | ORAL_CAPSULE | Freq: Every day | ORAL | 0 refills | Status: AC

## 2024-05-03 MED ORDER — AMPHETAMINE-DEXTROAMPHETAMINE 10 MG PO TABS: 10.0000 mg | ORAL_TABLET | Freq: Every day | ORAL | 0 refills | Status: AC

## 2024-05-03 MED ORDER — BUPROPION HCL ER (XL) 300 MG PO TB24: 300.0000 mg | ORAL_TABLET | Freq: Every day | ORAL | 3 refills | Status: AC

## 2024-05-03 NOTE — Progress Notes (Signed)
 Virtual Medical Office Visit  Patient:  Katrina Grant      Age: 53 y.o.       Sex:  female  Date:   05/03/2024  PCP:    Ozell Heron HERO, MD   Today's Healthcare Provider: Heron HERO Ozell, MD    Assessment/Plan:   Summary assessment:  Attention deficit hyperactivity disorder (ADHD), combined type -     Amphetamine -Dextroamphet ER; Take 1 capsule (20 mg total) by mouth daily.  Dispense: 30 capsule; Refill: 0 -     Amphetamine -Dextroamphetamine ; Take 1 tablet (10 mg total) by mouth daily after lunch.  Dispense: 30 tablet; Refill: 0 -     Amphetamine -Dextroamphetamine ; Take 1 tablet (10 mg total) by mouth daily after lunch.  Dispense: 30 tablet; Refill: 0 -     Amphetamine -Dextroamphet ER; Take 1 capsule (20 mg total) by mouth daily.  Dispense: 30 capsule; Refill: 0 -     Amphetamine -Dextroamphetamine ; Take 1 tablet (10 mg total) by mouth daily after lunch.  Dispense: 30 tablet; Refill: 0 -     Amphetamine -Dextroamphet ER; Take 1 capsule (20 mg total) by mouth daily.  Dispense: 30 capsule; Refill: 0  Anxiety disorder, unspecified type -     buPROPion  HCl ER (XL); Take 1 tablet (300 mg total) by mouth daily after breakfast.  Dispense: 90 tablet; Refill: 3   Doing well on the current dose of medication. I have sent in refills for the next 3 months of both the wellbutrin  and adderall.  No side effects reported, PDMP reviewed. RTC in 3 months for medication refills. 20 minutes spent with patient today over video.   Return in about 3 months (around 08/03/2024).   She was advised to call the office or go to ER if her condition worsens    Subjective:   Katrina Grant is a 53 y.o. female with PMH significant for: Past Medical History:  Diagnosis Date   ADD (attention deficit disorder)    Anxiety    Cholesterol serum elevated    on medication in the past   Crohn's disease of colon with fistula [facility])    sees Dr. Lehman, [facility]   Depression    Fibromyalgia - managed by Dr. Leni,  [facility] - takes Neurontin and Tramadol  11/20/2013   Frequent headaches    GERD (gastroesophageal reflux disease)    Osteoarthritis    knee, spine   Pneumonia    Syncope      Presenting today with: No chief complaint on file.    She clarifies and reports that her condition: ADHD -- pt reports she is doing well on the current regimen of 20 mg capsules in the morning and 10 mg tablets in the afternoon. She denies any heart racing, dry mouth or anxiety. States her mood symptoms are also well controlled on the lexapro  and wellbutrin .           Objective/Observations  Physical Exam:  Polite and friendly Gen: NAD, resting comfortably Pulm: Normal work of breathing Neuro: Grossly normal, moves all extremities Psych: Normal affect and thought content Problem specific physical exam findings:    No images are attached to the encounter or orders placed in the encounter.    Results: No results found for any visits on 05/03/24.        Virtual Visit via Video   I connected with Katrina Grant on 05/03/24 at  1:20 PM EST by a video enabled telemedicine application and verified that I am speaking  with the correct person using two identifiers. The limitations of evaluation and management by telemedicine and the availability of in person appointments were discussed. The patient expressed understanding and agreed to proceed.   Percentage of appointment time on video:  100% Patient location: Home Provider location: [facility] Persons participating in the virtual visit: Myself and Patient

## 2024-05-03 NOTE — Progress Notes (Signed)
 [facility] [facility] Specialty Medication Onboarding    Specialty Medication: Simlandi   Prior Authorization: Approved   Financial Assistance: Patient already has an active copay card. This patient appears to have an existing enrollment record in this program.   Copay/Day Supply: $100 / 28 days (LD& MD)    Insurance Restrictions: Yes - max 1 month supply     Notes to Pharmacist: will need co-pay card info   Credit Card on File: no  Start Date on Rx:    Delivery Method (based on home address currently on file): UPS no restrictions      The triage team has completed the benefits investigation and has determined that the patient is able to fill this medication at [facility]. Please contact the patient to complete the onboarding or follow up with the prescribing physician as needed.

## 2024-05-03 NOTE — Progress Notes (Unsigned)
 [place] [facility]    Patient Onboarding/Medication Counseling    Jasmine Mitchell is a 54 y.o. female with Crohn's who I am counseling today on initiation of therapy.  I am speaking to the patient.    Was a nurse, learning disability used for this call? No    Verified patient's date of birth / HIPAA.    Specialty medication(s) to be sent: Inflammatory Disorders: Simlandi       Non-specialty medications/supplies to be sent: sharps      Medications not needed at this time: n/a         Simlandi   (adalimumab -ryvk)    The patient declined counseling on medication administration, missed dose instructions, goals of therapy, side effects and monitoring parameters, warnings and precautions, drug/food interactions, and storage, handling precautions, and disposal because they have taken the medication previously. The information in the declined sections below are for informational purposes only and was not discussed with patient.       Medication & Administration     Dosage: Crohn's Disease: Inject 160mg  under the skin on day 1, 80mg  on day 15, then 40mg  every 7 days starting on day 29     Lab tests required prior to treatment initiation:  Tuberculosis: Tuberculosis screening resulted in a non-reactive Quantiferon TB Gold assay.(Completed: 08/22/2023)  Hepatitis B: Hepatitis B serology studies are complete and non-reactive.(Completed: 08/22/2023)    Administration:     Prefilled auto-injector pen  1. Gather all supplies needed for injection on a clean, flat working surface: medication pen removed from packaging, alcohol swab, sharps container, etc.  2. Look at the medication label - look for correct medication, correct dose, and check the expiration date  3. Look at the medication - the liquid visible in the window on the side of the pen device should appear clear and colorless  4. Lay the auto-injector pen on a flat surface and allow it to warm up to room temperature for at least 15-30 minutes  5. Select injection site - you can use the front of your thigh or your belly (but not the area 2 inches around your belly button); if someone else is giving you the injection you can also use your upper arm in the skin covering your triceps muscle  6. Prepare injection site - wash your hands and clean the skin at the injection site with an alcohol swab and let it air dry, do not touch the injection site again before the injection  7. Pull the 2 safety caps straight off - gray/white to uncover the needle cover and the plum cap to uncover the yellow activator button, do not remove until immediately prior to injection and do not touch the white needle cover  8. Gently squeeze the area of cleaned skin and hold it firmly to create a firm surface at the selected injection site  9. Put the white needle cover against your skin at the injection site at a 90 degree angle, hold the pen such that you can see the clear medication window  10. Press down and hold the pen firmly against your skin, press the yellow activator button to initiate the injection, there will be a click when the injection starts  11. Continue to hold the pen firmly against your skin for about 10-15 seconds  12. To verify the injection is complete after 10-15 seconds  13. Dispose of the used auto-injector pen immediately in your sharps disposal container the needle will be covered automatically  14. If you see  any blood at the injection site, press a cotton ball or gauze on the site and maintain pressure until the bleeding stops, do not rub the injection site    Adherence/Missed dose instructions:  If your injection is given more than 3 days after your scheduled injection date - consult your pharmacist for additional instructions on how to adjust your dosing schedule.    Goals of Therapy     - Achieve remission of symptoms  - Maintain remission of symptoms  - Minimize long-term systemic glucocorticoid use  - Prevent need for surgical procedures  - Maintenance of effective psychosocial functioning    Side Effects & Monitoring Parameters     Injection site reaction (redness, irritation, inflammation localized to the site of administration)  Signs of a common cold - minor sore throat, runny or stuffy nose, etc.  Upset stomach  Headache    The following side effects should be reported to the provider:  Signs of a hypersensitivity reaction - rash; hives; itching; red, swollen, blistered, or peeling skin; wheezing; tightness in the chest or throat; difficulty breathing, swallowing, or talking; swelling of the mouth, face, lips, tongue, or throat; etc.  Reduced immune function - report signs of infection such as fever; chills; body aches; very bad sore throat; ear or sinus pain; cough; more sputum or change in color of sputum; pain with passing urine; wound that will not heal, etc.  Also at a slightly higher risk of some malignancies (mainly skin and blood cancers) due to this reduced immune function.  In the case of signs of infection - the patient should hold the next dose of adalimumab  and call your primary care provider to ensure adequate medical care.  Treatment may be resumed when infection is treated and patient is asymptomatic.  Changes in skin - a new growth or lump that forms; changes in shape, size, or color of a previous mole or marking  Signs of unexplained bruising or bleeding - throwing up blood or emesis that looks like coffee grounds; black, tarry, or bloody stool; etc.  Signs of new or worsening heart failure - shortness of breath; sudden weight gain; heartbeat that is not normal; swelling in the arms or legs that is new or worse      Contraindications, Warnings, & Precautions     Have your bloodwork checked as you have been told by your prescriber  Talk with your doctor if you are pregnant, planning to become pregnant, or breastfeeding  Discuss the possible need for holding your dose(s) of adalimumab  when a planned procedure is scheduled with the prescriber as it may delay healing/recovery timeline       Drug/Food Interactions     Medication list reviewed in Epic. The patient was instructed to inform the care team before taking any new medications or supplements. No drug interactions identified.   Talk with you prescriber or pharmacist before receiving any live vaccinations while taking this medication and after you stop taking it    Storage, Handling Precautions, & Disposal     Store this medication in the refrigerator.  Do not freeze  If needed, you may store at room temperature for up to 28 days  Store in original packaging, protected from light  Do not shake  Dispose of used syringes/pens in a sharps disposal container      Current Medications (including OTC/herbals), Comorbidities and Allergies     Current Medications[1]    Allergies[2]    Problem List[3]    Medication list  has been reviewed and updated in Epic: Yes    Allergies have been reviewed and updated in Epic: Yes    Appropriateness of Therapy     Acute infections noted within Epic:  No active infections  Patient reported infection: None    Is the medication and dose appropriate considering the patient???s diagnosis, treatment, and disease journey, comorbidities, medical history, current medications, allergies, therapeutic goals, self-administration ability, and access barriers? Yes    Prescription has been clinically reviewed: Yes      Baseline Quality of Life Assessment      How many days over the past month did your Crohn's  keep you from your normal activities? For example, brushing your teeth or getting up in the morning. 0    Financial Information     Medication Assistance provided: Prior Authorization and Copay Assistance    Anticipated copay of $0 reviewed with patient. Verified delivery address.    Delivery Information     Scheduled delivery date: 11/20    Expected start date: 11/20      Medication will be delivered via UPS to the prescription address in [facility].  This shipment will not require a signature. Explained the services we provide at [facility] and that each month we would call to set up refills.  Stressed importance of returning phone calls so that we could ensure they receive their medications in time each month.  Informed patient that we should be setting up refills 7-10 days prior to when they will run out of medication.  A pharmacist will reach out to perform a clinical assessment periodically.  Informed patient that a welcome packet, containing information about our pharmacy and other support services, a Notice of Privacy Practices, and a drug information handout will be sent.      The patient or caregiver noted above participated in the development of this care plan and knows that they can request review of or adjustments to the care plan at any time.      Patient or caregiver verbalized understanding of the above information as well as how to contact the pharmacy at [phone] option 4 with any questions/concerns.  The pharmacy is open Monday through Friday 8:30am-4:30pm.  A pharmacist is available 24/7 via pager to answer any clinical questions they may have.    Patient Specific Needs     Does the patient have any physical, cognitive, or cultural barriers? No    Does the patient have adequate living arrangements? (i.e. the ability to store and take their medication appropriately) Yes    Did you identify any home environmental safety or security hazards? No    Patient prefers to have medications discussed with  Patient     Is the patient or caregiver able to read and understand education materials at a high school level or above? Yes    Patient's primary language is  English     Is the patient high risk? No    Does the patient have an additional or emergency contact listed in their chart? Yes    SOCIAL DETERMINANTS OF HEALTH     At the [facility], we have learned that life circumstances - like trouble affording food, housing, utilities, or transportation can affect the health of many of our patients.   That is why we wanted to ask: are you currently experiencing any life circumstances that are negatively impacting your health and/or quality of life? No    Social Drivers of  Health     Food Insecurity: No Food Insecurity (11/22/2021)    Received from [facility]    Hunger Vital Sign     Within the past 12 months, you worried that your food would run out before you got the money to buy more.: Never true     Within the past 12 months, the food you bought just didn't last and you didn't have money to get more.: Never true   Tobacco Use: Low Risk (04/23/2024)    Patient History     Smoking Tobacco Use: Never     Smokeless Tobacco Use: Never     Passive Exposure: Not on file   Transportation Needs: No Transportation Needs (11/22/2021)    Received from [facility] - Transportation     Lack of Transportation (Medical): No     Lack of Transportation (Non-Medical): No   Alcohol Use: Not on file   Housing: Not on file   Physical Activity: Unknown (11/22/2021)    Received from [facility]    Exercise Vital Sign     On average, how many days per week do you engage in moderate to strenuous exercise (like a brisk walk)?: 0 days     Minutes of Exercise per Session: Not on file   Utilities: Not on file   Stress: No Stress Concern Present (11/22/2021)    Received from [facility] - Occupational Stress Questionnaire     Feeling of Stress : Not at all   Interpersonal Safety: Not At Risk (04/23/2024)    Interpersonal Safety     Unsafe Where You Currently Live: No     Physically Hurt by Anyone: No     Abused by Anyone: No   Substance Use: Not on file (04/30/2023)   Intimate Partner Violence: Not At Risk (04/23/2024)    Humiliation, Afraid, Rape, and Kick questionnaire     Fear of Current or Ex-Partner: No     Emotionally Abused: No     Physically Abused: No     Sexually Abused: No   Social Connections: Unknown (11/22/2021)    Received from [facility] Social Connection and Isolation Panel     In a typical week, how many times do you talk on the phone with family, friends, or neighbors?: Three times a week     How often do you get together with friends or relatives?: Once a week     How often do you attend church or religious services?: Patient declined     Do you belong to any clubs or organizations such as church groups, unions, fraternal or athletic groups, or school groups?: Patient declined     Attends Banker Meetings: Not on file     Are you married, widowed, divorced, separated, never married, or living with a partner?: Married   Physicist, Medical Strain: Low Risk  (11/22/2021)    Received from [facility]    Overall Financial Resource Strain (CARDIA)     Difficulty of Paying Living Expenses: Not very hard   Health Literacy: Not on file   Internet Connectivity: Not on file       Would you be willing to receive help with any of the needs that you have identified today? No       Jasmine Mitchell, PharmD  [facility] Specialty Pharmacist         [1]   Current  Outpatient Medications   Medication Sig Dispense Refill    escitalopram oxalate (LEXAPRO) 20 MG tablet       acetaminophen (TYLENOL) 500 MG tablet Take 2 tablets (1,000 mg total) by mouth every four (4) hours as needed for pain.      adalimumab -ryvk (SIMLANDI ,CF, AUTOINJECTOR) 40 mg/0.4 mL atIK Inject 40 mg under the skin every seven (7) days. 12 each 1    adalimumab -ryvk (SIMLANDI ,CF, AUTOINJECTOR) 80 mg/0.8 mL atIK Inject the contents of 2 pens (160mg ) under the skin day 1, then the contents of 1 pen (80mg ) under the skin day 15, then continue 40mg  subcutaneous weekly dosing for maintenance 3 each 0    budesonide  (ENTOCORT EC ) 3 mg 24 hr capsule Take 3 capsules (9 mg total) by mouth every morning for 30 days, THEN 2 capsules (6 mg total) every morning for 14 days, THEN 1 capsule (3 mg total) every morning for 14 days. 132 capsule 0    buPROPion (WELLBUTRIN XL) 150 MG 24 hr tablet Take 1 tablet (150 mg total) by mouth daily.      cholestyramine  (CHOLESTYRAMINE  LIGHT) 4 gram PwPk Take 1 packet by mouth daily. 30 packet 11    desipramine  (NORPRAMIN ) 25 MG tablet TAKE 6 TABLETS (150 MG TOTAL) BY MOUTH DAILY. 540 tablet 2    dextroamphetamine-amphetamine (ADDERALL XR) 20 MG 24 hr capsule Take 1 capsule (20 mg total) by mouth daily.      dextroamphetamine-amphetamine (ADDERALL) 10 mg tablet TAKE 1 TABLET DAILY AFTER LUNCH.      empty container Misc Use as directed 1 each 3    fluticasone (FLONASE) 50 mcg/actuation nasal spray 1 SPRAY BY EACH NARE ROUTE DAILY. AS DIRECTED (Patient taking differently: 1 spray by Each Nare route daily as needed. As directed) 16 mL 2    MULTIVIT WITH CALCIUM,IRON,MIN (WOMEN'S DAILY MULTIVITAMIN ORAL) Take 1 tablet by mouth once daily.      ondansetron (ZOFRAN-ODT) 8 MG disintegrating tablet Take 1 tablet (8 mg total) by mouth every eight (8) hours as needed.      polyethylene glycol (CLEARLAX) 17 gram/dose powder Take as directed for split prep. 238 g 0    polyethylene glycol (CLEARLAX) 17 gram/dose powder Take as directed for split prep. 119 g 0    simethicone  (MYLICON) 125 MG chewable tablet Take 2 tablets (250mg  total) with prep as directed. 4 tablet 0    sulfamethoxazole -trimethoprim  (BACTRIM  DS) 800-160 mg per tablet Take 1 tablet (160 mg of trimethoprim  total) by mouth every twelve (12) hours.       No current facility-administered medications for this visit.   [2]   Allergies  Allergen Reactions    Oxycodone  (Bulk) Nausea And Vomiting     Patient tolerates acetaminophen well   [3]   Patient Active Problem List  Diagnosis    Crohn's disease    (CMS-HCC)    Fever    Hematuria    Cough    Crohn's disease with complication    (CMS-HCC) this visit.   [2]   Allergies  Allergen Reactions    Oxycodone  (Bulk) Nausea And Vomiting     Patient tolerates acetaminophen well   [3]   Patient Active Problem List  Diagnosis    Crohn's disease    (CMS-HCC)    Fever    Hematuria    Cough    Crohn's disease with complication    (CMS-HCC)

## 2024-05-07 MED ORDER — EMPTY CONTAINER
3 refills | 0.00000 days
Start: 2024-05-07 — End: ?

## 2024-05-08 MED FILL — EMPTY CONTAINER: 120 days supply | Qty: 1 | Fill #0

## 2024-05-23 NOTE — Progress Notes (Signed)
 [facility] Clinical Assessment & Refill Coordination Note    -Patient will take 2nd loading 80mg  dose tomorrow 12/5.  She will start 40mg  once a week starting 12/19.   -Pt had a redness irritation after injection and took benadryl which helped.   Patient had a similar reaction with Humira .  She will try to pre-medicate with antihistamine.   -Pt states she was not aware to bring medication to room temperature before injecting.  Patient will now try that for future doses to see if that alleviates irritation after injection.    Hadassah Louetta Skates, DOB: 1971-06-16  Phone: There are no phone numbers on file.    All above HIPAA information was verified with patient.     Was a nurse, learning disability used for this call? No    Specialty Medication(s):   Inflammatory Disorders: simlandi      Current Medications[1]     Changes to medications: Enyah reports no changes at this time.    Medication list has been reviewed and updated in Epic: Yes    Allergies[2]    Changes to allergies: No    Allergies have been reviewed and updated in Epic: Yes    SPECIALTY MEDICATION ADHERENCE     Simlandi  40/0.4 mg/ml: 1 doses of medicine on hand     Medication Adherence    Patient reported X missed doses in the last month: 0  Specialty Medication: Simlandi   Patient is on additional specialty medications: No  Informant: patient          Specialty medication(s) dose(s) confirmed: Regimen is correct and unchanged.     Are there any concerns with adherence? No    Adherence counseling provided? Not needed    CLINICAL MANAGEMENT AND INTERVENTION      Clinical Benefit Assessment:    Do you feel the medicine is effective or helping your condition? Yes    Clinical Benefit counseling provided? Patient feels like she is doing a lot better    Adverse Effects Assessment:    Are you experiencing any side effects? No    Are you experiencing difficulty administering your medicine? No    Quality of Life Assessment:    Quality of Life Rheumatology  Oncology  Dermatology  Cystic Fibrosis          How many days over the past month did your conditino  keep you from your normal activities? For example, brushing your teeth or getting up in the morning. 0    Have you discussed this with your provider? Not needed    Acute Infection Status:    Acute infections noted within Epic:  No active infections    Patient reported infection: None    Therapy Appropriateness:    Is the medication and dose appropriate considering the patient???s diagnosis, treatment, and disease journey, comorbidities, medical history, current medications, allergies, therapeutic goals, self-administration ability, and access barriers? Yes, therapy is appropriate and should be continued     Clinical Intervention:    Was an intervention completed as part of this clinical assessment? No    DISEASE/MEDICATION-SPECIFIC INFORMATION      For patients on injectable medications: Next injection is scheduled for 12/5.    Chronic Inflammatory Diseases: Have you experienced any flares in the last month? No    PATIENT SPECIFIC NEEDS     Does the patient have any physical, cognitive, or cultural barriers? No    Is the patient high risk? No    Does the patient require  physician intervention or other additional services (i.e., nutrition, smoking cessation, social work)? No    Does the patient have an additional or emergency contact listed in their chart? Yes    SOCIAL DETERMINANTS OF HEALTH     At the [facility], we have learned that life circumstances - like trouble affording food, housing, utilities, or transportation can affect the health of many of our patients.   That is why we wanted to ask: are you currently experiencing any life circumstances that are negatively impacting your health and/or quality of life? Patient declined to answer    Social Drivers of Health     Food Insecurity: No Food Insecurity (11/22/2021)    Received from [facility]    Hunger Vital Sign     Within the past 12 months, you worried that your food would run out before you got the money to buy more.: Never true     Within the past 12 months, the food you bought just didn't last and you didn't have money to get more.: Never true   Tobacco Use: Low Risk (04/23/2024)    Patient History     Smoking Tobacco Use: Never     Smokeless Tobacco Use: Never     Passive Exposure: Not on file   Transportation Needs: No Transportation Needs (11/22/2021)    Received from [facility] - Transportation     Lack of Transportation (Medical): No     Lack of Transportation (Non-Medical): No   Alcohol Use: Not on file   Housing: Not on file   Physical Activity: Unknown (11/22/2021)    Received from [facility]    Exercise Vital Sign     On average, how many days per week do you engage in moderate to strenuous exercise (like a brisk walk)?: 0 days     Minutes of Exercise per Session: Not on file   Utilities: Not on file   Stress: No Stress Concern Present (11/22/2021)    Received from [facility] - Occupational Stress Questionnaire     Feeling of Stress : Not at all   Interpersonal Safety: Not At Risk (04/23/2024)    Interpersonal Safety     Unsafe Where You Currently Live: No     Physically Hurt by Anyone: No     Abused by Anyone: No   Substance Use: Not on file (04/30/2023)   Intimate Partner Violence: Not At Risk (04/23/2024)    Humiliation, Afraid, Rape, and Kick questionnaire     Fear of Current or Ex-Partner: No     Emotionally Abused: No     Physically Abused: No     Sexually Abused: No   Social Connections: Unknown (11/22/2021)    Received from [facility]    Social Connection and Isolation Panel     In a typical week, how many times do you talk on the phone with family, friends, or neighbors?: Three times a week     How often do you get together with friends or relatives?: Once a week     How often do you attend church or religious services?: Patient declined     Do you belong to any clubs or organizations such as church groups, unions, fraternal or athletic groups, or school groups?: Patient declined     Attends Banker Meetings: Not on file     Are you married, widowed, divorced, separated, never married, or living  with a partner?: Married   Programmer, Applications: Low Risk  (11/22/2021)    Received from [facility]    Overall Financial Resource Strain (CARDIA)     Difficulty of Paying Living Expenses: Not very hard   Health Literacy: Not on file   Internet Connectivity: Not on file       Would you be willing to receive help with any of the needs that you have identified today? Not applicable       SHIPPING     Specialty Medication(s) to be Shipped:   Inflammatory Disorders: Simlandi     Other medication(s) to be shipped: No additional medications requested for fill at this time    Specialty Medications not needed at this time: N/A     Changes to insurance: No    Cost and Payment: Patient has a $0 copay, payment information is not required.    Delivery Scheduled: Yes, Expected medication delivery date: 12/11.     Medication will be delivered via UPS to the confirmed prescription address in [facility].    The patient will receive a drug information handout for each medication shipped and additional FDA Medication Guides as required.  Verified that patient has previously received a Conservation Officer, Historic Buildings and a Surveyor, Mining.    The patient or caregiver noted above participated in the development of this care plan and knows that they can request review of or adjustments to the care plan at any time.      All of the patient's questions and concerns have been addressed.    Rosalynn GORMAN Kin, PharmD   [facility] Specialty Pharmacist       [1]   Current Outpatient Medications   Medication Sig Dispense Refill    acetaminophen (TYLENOL) 500 MG tablet Take 2 tablets (1,000 mg total) by mouth every four (4) hours as needed for pain.      adalimumab -ryvk (SIMLANDI ,CF, AUTOINJECTOR) 40 mg/0.4 mL atIK Inject the contents of 1 pen (40 mg) under the skin every seven (7) days. 12 each 1    budesonide  (ENTOCORT EC ) 3 mg 24 hr capsule Take 3 capsules (9 mg total) by mouth every morning for 30 days, THEN 2 capsules (6 mg total) every morning for 14 days, THEN 1 capsule (3 mg total) every morning for 14 days. 132 capsule 0    buPROPion (WELLBUTRIN XL) 150 MG 24 hr tablet Take 1 tablet (150 mg total) by mouth daily.      cholestyramine  (CHOLESTYRAMINE  LIGHT) 4 gram PwPk Take 1 packet by mouth daily. 30 packet 11    desipramine  (NORPRAMIN ) 25 MG tablet TAKE 6 TABLETS (150 MG TOTAL) BY MOUTH DAILY. 540 tablet 2    dextroamphetamine-amphetamine (ADDERALL XR) 20 MG 24 hr capsule Take 1 capsule (20 mg total) by mouth daily.      dextroamphetamine-amphetamine (ADDERALL) 10 mg tablet TAKE 1 TABLET DAILY AFTER LUNCH.      empty container Misc Use as directed 1 each 3    escitalopram oxalate (LEXAPRO) 20 MG tablet       fluticasone (FLONASE) 50 mcg/actuation nasal spray 1 SPRAY BY EACH NARE ROUTE DAILY. AS DIRECTED (Patient taking differently: 1 spray by Each Nare route daily as needed. As directed) 16 mL 2    MULTIVIT WITH CALCIUM,IRON,MIN (WOMEN'S DAILY MULTIVITAMIN ORAL) Take 1 tablet by mouth once daily.      ondansetron (ZOFRAN-ODT) 8 MG disintegrating tablet Take 1 tablet (8 mg total) by mouth every  eight (8) hours as needed.      polyethylene glycol (CLEARLAX) 17 gram/dose powder Take as directed for split prep. 238 g 0    polyethylene glycol (CLEARLAX) 17 gram/dose powder Take as directed for split prep. 119 g 0    simethicone  (MYLICON) 125 MG chewable tablet Take 2 tablets (250mg  total) with prep as directed. 4 tablet 0    sulfamethoxazole -trimethoprim  (BACTRIM  DS) 800-160 mg per tablet Take 1 tablet (160 mg of trimethoprim  total) by mouth every twelve (12) hours.       No current facility-administered medications for this visit.   [2]   Allergies  Allergen Reactions    Oxycodone  (Bulk) Nausea And Vomiting     Patient tolerates acetaminophen well

## 2024-05-28 DIAGNOSIS — K5000 Crohn's disease of small intestine without complications: Principal | ICD-10-CM

## 2024-05-29 NOTE — Progress Notes (Signed)
 Jasmine Mitchell 's SIMLANDI (CF) AUTOINJECTOR 40 mg/0.4 mL Atik (adalimumab -ryvk) shipment will be delayed as a result of prior authorization being required by the patient's insurance.     I have reached out to the patient  at [phone] and communicated the delay. We will call the patient back to reschedule the delivery upon resolution. We have not confirmed the new delivery date.

## 2024-05-30 NOTE — Progress Notes (Signed)
 Jasmine Mitchell 's Simlandi  shipment will be canceled as a result of no longer being eligible to fill at [facility] pharmacy.     I have reached out to the patient  at [phone] and communicated the delay. We will route a message to clinic staff to inform them of this situation We have canceled this work request.

## 2024-05-30 NOTE — Progress Notes (Signed)
 [facility] has received the prescription(s) for SIMLANDI (CF) AUTOINJECTOR 40 mg/0.4 mL Atik (adalimumab -ryvk). The triage team has completed the benefits investigation and has determined that the patient is NOT able to fill this medication at the [facility] due to insurance plan limitations. Please see additional information below and re-route the prescription to the preferred pharmacy. Thank you.    Authorized 05/29/24-05/29/25 BC[employer] [place]    Specialty Pharmacy Required:  [facility] - Phone:[phone] and Fax: [phone]    Please note that due to Board of Pharmacy restrictions the authorizing provider must reorder to the pharmacy listed above due to insurance preference

## 2024-05-31 MED ORDER — ADALIMUMAB-RYVK 40 MG/0.4 ML SUBCUTANEOUS AUTO-INJECTOR KIT
SUBCUTANEOUS | 1 refills | 84.00000 days | Status: CP
Start: 2024-05-31 — End: ?

## 2024-05-31 NOTE — Progress Notes (Signed)
 Specialty Medication(s): Simlandi     Jasmine Mitchell has been dis-enrolled from the [facility] specialty pharmacy services as a result of a pharmacy change resulting from insurance limitations. The insurance company requires the patient fill at [facility].    Additional information provided to the patient: n/a    Leron Stoffers A Axcel Horsch, PharmD  [facility] Specialty Pharmacist

## 2024-06-06 ENCOUNTER — Ambulatory Visit: Payer: Self-pay

## 2024-06-06 NOTE — Telephone Encounter (Signed)
 FYI Only or Action Required?: FYI only for provider: appointment scheduled on 06/07/2024.  Patient was last seen in primary care on 05/03/2024 by Ozell Heron HERO, MD.  Called Nurse Triage reporting Vaginal Bleeding.  Symptoms began x week.  Interventions attempted: Nothing.  Symptoms are: gradually worsening.  Triage Disposition: See Physician Within 24 Hours  Patient/caregiver understands and will follow disposition?: Yes   Copied from CRM #[id number]. Topic: Clinical - Red Word Triage >> Jun 06, 2024  9:18 AM Adelita E wrote: [facility] that prompted transfer to Nurse Triage: Abnormal bleeding/spotting. Patient has been on estradiol -levonorgestrel (CLIMARA  PRO) 0.045-0.015 MG/DAY, experience cramps, and back pain. Patient has not had a menstrual cycle in 3 years. Reason for Disposition  Taking Coumadin (warfarin) or other strong blood thinner, or known bleeding disorder (e.g., thrombocytopenia)    Pt only wants to me seen by her PCP  Answer Assessment - Initial Assessment Questions 1. BLEEDING SEVERITY: Describe the bleeding that you are having. How much bleeding is there?      Spotting - heavier today than yesterday 2. ONSET: When did the bleeding begin? Is it continuing now?     X week 3. MENOPAUSE: When was your last menstrual period?      3 years 4. ABDOMEN PAIN: Do you have any pain? How bad is the pain?  (e.g., Scale 0-10; none, mild, moderate, or severe)     cramping 5. BLOOD THINNERS: Do you take any blood thinners? (e.g., Coumadin/warfarin, Pradaxa/dabigatran, aspirin)     Goody powder 6. HORMONE MEDICINES: Are you taking any hormone medicines, prescription or OTC? (e.g., birth control pills, estrogen)     estradiol  7. CAUSE: What do you think is causing the bleeding? (e.g., recent gyn surgery, recent gyn procedure; known bleeding disorder, uterine cancer)       unknown 8. HEMODYNAMIC STATUS: Are you weak or feeling lightheaded? If Yes, ask:  Can you stand and walk normally?       no 9. OTHER SYMPTOMS: What other symptoms are you having with the bleeding? (e.g., back pain, burning with urination, fever)     Back pain, burning with urination, strong order urine  Protocols used: Vaginal Bleeding - Postmenopausal-A-AH

## 2024-06-06 NOTE — Telephone Encounter (Signed)
 Patient informed of the message below and has an appt previously scheduled on 12/19.

## 2024-06-06 NOTE — Telephone Encounter (Signed)
 Please tell her to stop the hormone replacement immediately and this should resolve the bleeding. Then have her schedule an appointment with me

## 2024-06-07 ENCOUNTER — Ambulatory Visit: Payer: PRIVATE HEALTH INSURANCE | Admitting: Family Medicine

## 2024-06-07 ENCOUNTER — Encounter: Payer: Self-pay | Admitting: Family Medicine

## 2024-06-07 VITALS — BP 122/78 | HR 55 | Temp 97.9°F | Ht 65.0 in | Wt 229.7 lb

## 2024-06-07 DIAGNOSIS — R300 Dysuria: Secondary | ICD-10-CM | POA: Diagnosis not present

## 2024-06-07 DIAGNOSIS — N3001 Acute cystitis with hematuria: Secondary | ICD-10-CM | POA: Diagnosis not present

## 2024-06-07 DIAGNOSIS — N939 Abnormal uterine and vaginal bleeding, unspecified: Secondary | ICD-10-CM | POA: Diagnosis not present

## 2024-06-07 LAB — POCT URINALYSIS DIPSTICK
Bilirubin, UA: NEGATIVE
Glucose, UA: NEGATIVE
Ketones, UA: NEGATIVE
Leukocytes, UA: NEGATIVE
Nitrite, UA: POSITIVE
Protein, UA: POSITIVE — AB
Spec Grav, UA: 1.02
Urobilinogen, UA: 0.2 U/dL
pH, UA: 6

## 2024-06-07 MED ORDER — SULFAMETHOXAZOLE-TRIMETHOPRIM 800-160 MG PO TABS: 1.0000 | ORAL_TABLET | Freq: Two times a day (BID) | ORAL | 0 refills | Status: AC

## 2024-06-07 NOTE — Progress Notes (Unsigned)
 "  Established Patient Office Visit  Subjective   Patient ID: Katrina Grant, female    DOB: Dec 08, 1970  Age: 53 y.o. MRN: [id number]  Chief Complaint  Patient presents with   Vaginal Bleeding    Vaginal Bleeding     Current Outpatient Medications  Medication Instructions   acetaminophen  (TYLENOL ) 1,000 mg, Every 6 hours PRN   adalimumab  (HUMIRA ) 40 mg, Weekly   amphetamine -dextroamphetamine  (ADDERALL XR) 20 MG 24 hr capsule 20 mg, Oral, Daily   amphetamine -dextroamphetamine  (ADDERALL XR) 20 MG 24 hr capsule 20 mg, Oral, Daily   [START ON 06/27/2024] amphetamine -dextroamphetamine  (ADDERALL XR) 20 MG 24 hr capsule 20 mg, Oral, Daily   amphetamine -dextroamphetamine  (ADDERALL) 10 MG tablet 10 mg, Oral, Daily after lunch   amphetamine -dextroamphetamine  (ADDERALL) 10 MG tablet 10 mg, Oral, Daily after lunch   [START ON 06/27/2024] amphetamine -dextroamphetamine  (ADDERALL) 10 MG tablet 10 mg, Oral, Daily after lunch   buPROPion  (WELLBUTRIN  XL) 300 mg, Oral, Daily after breakfast   diclofenac  Sodium (VOLTAREN ) 2 g, Topical, 4 times daily   escitalopram  (LEXAPRO ) 20 mg, Oral, Daily   ondansetron  (ZOFRAN  ODT) 8 mg, Oral, Every 8 hours PRN   Prenatal Vit-Fe Fumarate-FA (M-NATAL PLUS ) 27-1 MG TABS TAKE 1 TABLET BY MOUTH EVERY DAY BEFORE BREAKFAST   sulfamethoxazole -trimethoprim  (BACTRIM  DS) 800-160 MG tablet 1 tablet, Oral, 2 times daily   Vitamin D  (Ergocalciferol ) (DRISDOL ) 50,000 Units, Oral, Every 7 days    Patient Active Problem List   Diagnosis Date Noted   Anxiety disorder 02/01/2022   Recurrent UTI 02/01/2022   ADD (attention deficit disorder) 10/08/2021   BMI 38.0-38.9,adult 11/24/2016   Crohn's disease with complication (HCC) 11/20/2013   Fibromyalgia - managed by Dr. Leni, [facility] - takes Neurontin and Tramadol  11/20/2013   DUB (dysfunctional uterine bleeding) 03/12/2011     Review of Systems  Genitourinary:  Positive for vaginal bleeding.  All other systems  reviewed and are negative.     Objective:     BP 122/78   Pulse (!) 55   Temp 97.9 F (36.6 C) (Oral)   Ht 5' 5 (1.651 m)   Wt 229 lb 11.2 oz (104.2 kg)   LMP 12/24/2020   SpO2 97%   BMI 38.22 kg/m  {Vitals History (Optional):23777}  Physical Exam Vitals reviewed.  Constitutional:      Appearance: Normal appearance. She is obese.  Neurological:     Mental Status: She is alert.      Results for orders placed or performed in visit on 06/07/24  POC Urinalysis Dipstick  Result Value Ref Range   Color, UA yellow    Clarity, UA cloudy    Glucose, UA Negative Negative   Bilirubin, UA negative    Ketones, UA negative    Spec Grav, UA 1.020 1.010 - 1.025   Blood, UA 3+    pH, UA 6.0 5.0 - 8.0   Protein, UA Positive (A) Negative   Urobilinogen, UA 0.2 0.2 or 1.0 E.U./dL   Nitrite, UA positive    Leukocytes, UA Negative Negative   Appearance     Odor      {Labs (Optional):23779}  The 10-year ASCVD risk score (Arnett DK, et al., 2019) is: 1.3%    Assessment & Plan:  Vaginal bleeding  Dysuria -     POCT urinalysis dipstick -     Urine Culture  Acute cystitis with hematuria -     Sulfamethoxazole -Trimethoprim ; Take 1 tablet by mouth 2 (two) times daily for 5 days.  Dispense: 10 tablet; Refill: 0     No follow-ups on file.    Heron CHRISTELLA Sharper, MD "

## 2024-06-09 LAB — URINE CULTURE
MICRO NUMBER:: 17379297
SPECIMEN QUALITY:: ADEQUATE

## 2024-06-10 ENCOUNTER — Ambulatory Visit: Payer: Self-pay | Admitting: Family Medicine

## 2024-07-10 ENCOUNTER — Ambulatory Visit (INDEPENDENT_AMBULATORY_CARE_PROVIDER_SITE_OTHER): Payer: PRIVATE HEALTH INSURANCE | Admitting: Family Medicine

## 2024-07-10 ENCOUNTER — Encounter: Payer: Self-pay | Admitting: Family Medicine

## 2024-07-10 VITALS — BP 100/60 | HR 60 | Temp 98.2°F | Ht 65.0 in | Wt 230.4 lb

## 2024-07-10 DIAGNOSIS — J0100 Acute maxillary sinusitis, unspecified: Secondary | ICD-10-CM | POA: Diagnosis not present

## 2024-07-10 DIAGNOSIS — B3731 Acute candidiasis of vulva and vagina: Secondary | ICD-10-CM

## 2024-07-10 MED ORDER — FLUCONAZOLE 150 MG PO TABS: 150.0000 mg | ORAL_TABLET | ORAL | 0 refills | Status: AC | PRN

## 2024-07-10 MED ORDER — AZITHROMYCIN 250 MG PO TABS: ORAL_TABLET | ORAL | 0 refills | Status: AC

## 2024-07-10 NOTE — Progress Notes (Signed)
 "  Established Patient Office Visit  Subjective   Patient ID: Katrina Grant, female    DOB: 03-14-1971  Age: 54 y.o. MRN: [id number]  Chief Complaint  Patient presents with   Cough    Productive with chest congestion, tightness and yellow sputum x2 weeks, tried Mucinex, Alka Seltzer, Theraflu with no relief   Sore Throat    X2 weeks   Sinus Problem    Sinus congestion x2 weeks    Sinus Problem  Discussed the use of AI scribe software for clinical note transcription with the patient, who gave verbal consent to proceed.  History of Present Illness   Katrina Grant is a 54 year old female who presents with sinus issues and respiratory symptoms.  For the past two weeks she has had morning nasal congestion, persistent cough with phlegm, and intermittent sore throat with hoarseness and loss of voice on 2-3 occasions. She denies fever or chills.  She notes chest tightness and heaviness that worsen when lying down and improve with use of her grandson's albuterol  inhaler. She denies daytime shortness of breath or difficulty breathing.  Multiple household members have had similar respiratory symptoms. Her son, who lives with her, was diagnosed with bronchitis in the emergency room. She and her husband tested negative for COVID-19 on home tests.  She has had pneumonia twice and COVID-19 three times and feels her lungs have been more congested with minor colds since her first COVID infection.  She feels a lump sensation in her throat related to congestion, with intermittently swollen glands and a sensation of fluid in one ear.  She takes Humira  and is concerned it may slow her recovery from infections.        Current Outpatient Medications  Medication Instructions   acetaminophen  (TYLENOL ) 1,000 mg, Every 6 hours PRN   adalimumab  (HUMIRA ) 40 mg, Weekly   amphetamine -dextroamphetamine  (ADDERALL XR) 20 MG 24 hr capsule 20 mg, Oral, Daily   amphetamine -dextroamphetamine  (ADDERALL XR) 20  MG 24 hr capsule 20 mg, Oral, Daily   amphetamine -dextroamphetamine  (ADDERALL XR) 20 MG 24 hr capsule 20 mg, Oral, Daily   amphetamine -dextroamphetamine  (ADDERALL) 10 MG tablet 10 mg, Oral, Daily after lunch   amphetamine -dextroamphetamine  (ADDERALL) 10 MG tablet 10 mg, Oral, Daily after lunch   amphetamine -dextroamphetamine  (ADDERALL) 10 MG tablet 10 mg, Oral, Daily after lunch   azithromycin  (ZITHROMAX ) 250 MG tablet Take 2 tablets on day 1, then 1 tablet daily on days 2 through 5   buPROPion  (WELLBUTRIN  XL) 300 mg, Oral, Daily after breakfast   diclofenac  Sodium (VOLTAREN ) 2 g, Topical, 4 times daily   escitalopram  (LEXAPRO ) 20 mg, Oral, Daily   fluconazole  (DIFLUCAN ) 150 mg, Oral, Every 3 DAYS PRN    Patient Active Problem List   Diagnosis Date Noted   Anxiety disorder 02/01/2022   Recurrent UTI 02/01/2022   ADD (attention deficit disorder) 10/08/2021   BMI 38.0-38.9,adult 11/24/2016   Crohn's disease with complication (HCC) 11/20/2013   Fibromyalgia - managed by Dr. Leni, [facility] - takes Neurontin and Tramadol  11/20/2013   DUB (dysfunctional uterine bleeding) 03/12/2011     Review of Systems  All other systems reviewed and are negative.     Objective:     BP 100/60   Pulse 60   Temp 98.2 F (36.8 C) (Oral)   Ht 5' 5 (1.651 m)   Wt 230 lb 6.4 oz (104.5 kg)   LMP 12/24/2020   SpO2 98%   BMI 38.34 kg/m  Physical Exam Vitals reviewed.  Constitutional:      Appearance: She is well-developed. She is obese.  HENT:     Right Ear: Tympanic membrane normal.     Left Ear: Tympanic membrane normal.     Mouth/Throat:     Mouth: Mucous membranes are moist.     Pharynx: Posterior oropharyngeal erythema present.  Cardiovascular:     Rate and Rhythm: Normal rate and regular rhythm.     Heart sounds: Normal heart sounds. No murmur heard. Pulmonary:     Effort: Pulmonary effort is normal.     Breath sounds: Normal breath sounds. No wheezing, rhonchi or rales.   Lymphadenopathy:     Cervical: No cervical adenopathy.  Neurological:     Mental Status: She is alert and oriented to person, place, and time.  Psychiatric:        Mood and Affect: Mood normal.      No results found for any visits on 07/10/24.    The 10-year ASCVD risk score (Arnett DK, et al., 2019) is: 1%    Assessment & Plan:  Acute non-recurrent maxillary sinusitis -     Azithromycin ; Take 2 tablets on day 1, then 1 tablet daily on days 2 through 5  Dispense: 6 tablet; Refill: 0  Candida vaginitis -     Fluconazole ; Take 1 tablet (150 mg total) by mouth every three (3) days as needed.  Dispense: 2 tablet; Refill: 0   Assessment and Plan    Acute maxillary sinusitis Symptoms persisting for two weeks, including congestion, cough with phlegm, sore throat, hoarseness, and chest tightness. No fever or chills. Negative COVID test. Family members with similar symptoms, son diagnosed with bronchitis. Examination reveals fluid behind the eardrum and throat irritation. Lungs clear without wheezing. Differential includes viral infection progressing to bacterial sinusitis or ear infection. Discussed potential for bacterial infection due to prolonged symptoms and family history of similar infections. Azithromycin  chosen for its efficacy in treating bacterial sinusitis and low risk of causing yeast infections compared to other antibiotics. - Prescribed azithromycin  for potential bacterial sinusitis. - Prescribed Diflucan  to be taken at the end of azithromycin  course to prevent yeast infection. - Advised use of over-the-counter medications such as Theraflu, antihistamines, and DM-containing cough suppressants like Robitussin DM or Mucinex DM, especially at night.  Acute candidiasis of vulva and vagina Potential risk of yeast infection due to antibiotic use. Discussed that azithromycin  usually does not cause yeast infections, but Diflucan  is prescribed as a precaution. - Prescribed Diflucan   to be taken at the end of azithromycin  course to prevent yeast infection.        No follow-ups on file.    Heron CHRISTELLA Sharper, MD "

## 2024-07-21 ENCOUNTER — Other Ambulatory Visit: Payer: Self-pay | Admitting: Family Medicine

## 2024-07-21 DIAGNOSIS — E559 Vitamin D deficiency, unspecified: Secondary | ICD-10-CM
